# Patient Record
Sex: Male | Born: 1940 | Hispanic: Yes | Smoking: Former smoker
Health system: Southern US, Community
[De-identification: ages and names within clinical notes are randomized; demographics above are authoritative.]

## PROBLEM LIST (undated history)

## (undated) DIAGNOSIS — E785 Hyperlipidemia, unspecified: Secondary | ICD-10-CM

## (undated) DIAGNOSIS — Z21 Asymptomatic human immunodeficiency virus [HIV] infection status: Secondary | ICD-10-CM

## (undated) DIAGNOSIS — F329 Major depressive disorder, single episode, unspecified: Secondary | ICD-10-CM

## (undated) DIAGNOSIS — R001 Bradycardia, unspecified: Secondary | ICD-10-CM

## (undated) DIAGNOSIS — M1990 Unspecified osteoarthritis, unspecified site: Secondary | ICD-10-CM

## (undated) DIAGNOSIS — N400 Enlarged prostate without lower urinary tract symptoms: Secondary | ICD-10-CM

## (undated) DIAGNOSIS — J45909 Unspecified asthma, uncomplicated: Secondary | ICD-10-CM

## (undated) DIAGNOSIS — B20 Human immunodeficiency virus [HIV] disease: Secondary | ICD-10-CM

## (undated) DIAGNOSIS — F32A Depression, unspecified: Secondary | ICD-10-CM

## (undated) DIAGNOSIS — I639 Cerebral infarction, unspecified: Secondary | ICD-10-CM

## (undated) HISTORY — PX: EYE SURGERY: SHX253

## (undated) HISTORY — PX: OTHER SURGICAL HISTORY: SHX169

---

## 2004-09-19 ENCOUNTER — Emergency Department: Payer: Self-pay | Admitting: Unknown Physician Specialty

## 2004-10-03 ENCOUNTER — Other Ambulatory Visit: Payer: Self-pay

## 2004-10-03 ENCOUNTER — Emergency Department: Payer: Self-pay | Admitting: Emergency Medicine

## 2008-12-22 ENCOUNTER — Ambulatory Visit: Payer: Self-pay | Admitting: Urology

## 2008-12-26 ENCOUNTER — Ambulatory Visit: Payer: Self-pay | Admitting: Urology

## 2010-03-14 ENCOUNTER — Ambulatory Visit: Payer: Self-pay | Admitting: Pain Medicine

## 2010-03-20 ENCOUNTER — Ambulatory Visit: Payer: Self-pay | Admitting: Pain Medicine

## 2010-04-09 ENCOUNTER — Ambulatory Visit: Payer: Self-pay | Admitting: Pain Medicine

## 2010-04-17 ENCOUNTER — Ambulatory Visit: Payer: Self-pay | Admitting: Pain Medicine

## 2010-05-03 ENCOUNTER — Ambulatory Visit: Payer: Self-pay | Admitting: Pain Medicine

## 2010-05-10 ENCOUNTER — Ambulatory Visit: Payer: Self-pay | Admitting: Pain Medicine

## 2010-05-24 ENCOUNTER — Ambulatory Visit: Payer: Self-pay | Admitting: Pain Medicine

## 2010-06-06 ENCOUNTER — Ambulatory Visit: Payer: Self-pay | Admitting: Pain Medicine

## 2012-11-09 ENCOUNTER — Emergency Department: Payer: Self-pay | Admitting: Unknown Physician Specialty

## 2012-11-09 LAB — URINALYSIS, COMPLETE
Bilirubin,UR: NEGATIVE
Glucose,UR: NEGATIVE mg/dL (ref 0–75)
Hyaline Cast: 8
Ketone: NEGATIVE
Ph: 5 (ref 4.5–8.0)
Protein: 30
WBC UR: 215 /HPF (ref 0–5)

## 2012-11-09 LAB — COMPREHENSIVE METABOLIC PANEL
Albumin: 3.3 g/dL — ABNORMAL LOW (ref 3.4–5.0)
Alkaline Phosphatase: 68 U/L (ref 50–136)
Anion Gap: 7 (ref 7–16)
Bilirubin,Total: 0.5 mg/dL (ref 0.2–1.0)
Calcium, Total: 8.3 mg/dL — ABNORMAL LOW (ref 8.5–10.1)
EGFR (African American): >60
EGFR (Non-African Amer.): >60
Osmolality: 280 (ref 275–301)
Potassium: 3.4 mmol/L — ABNORMAL LOW (ref 3.5–5.1)
SGPT (ALT): 37 U/L (ref 12–78)
Total Protein: 8 g/dL (ref 6.4–8.2)

## 2012-11-09 LAB — CBC
MCHC: 31.8 g/dL — ABNORMAL LOW (ref 32.0–36.0)
MCV: 84 fL (ref 80–100)
Platelet: 180 10*3/uL (ref 150–440)
RDW: 13.9 % (ref 11.5–14.5)
WBC: 6.3 10*3/uL (ref 3.8–10.6)

## 2012-11-09 LAB — CK TOTAL AND CKMB (NOT AT ARMC): CK-MB: <0.5 ng/mL — ABNORMAL LOW (ref 0.5–3.6)

## 2014-10-15 ENCOUNTER — Observation Stay: Payer: Self-pay | Admitting: Internal Medicine

## 2014-10-15 LAB — CK TOTAL AND CKMB (NOT AT ARMC)
CK, Total: 406 U/L — ABNORMAL HIGH (ref 39–308)
CK, Total: 354 U/L — ABNORMAL HIGH (ref 39–308)
CK, Total: 320 U/L — ABNORMAL HIGH (ref 39–308)
CK-MB: 12.2 ng/mL — ABNORMAL HIGH (ref 0.5–3.6)
CK-MB: 9.3 ng/mL — ABNORMAL HIGH (ref 0.5–3.6)
CK-MB: 6.9 ng/mL — ABNORMAL HIGH (ref 0.5–3.6)

## 2014-10-15 LAB — CBC
HCT: 47 % (ref 40.0–52.0)
HGB: 15.4 g/dL (ref 13.0–18.0)
MCH: 29.3 pg (ref 26.0–34.0)
MCHC: 32.8 g/dL (ref 32.0–36.0)
MCV: 90 fL (ref 80–100)
PLATELETS: 231 10*3/uL (ref 150–440)
RBC: 5.25 10*6/uL (ref 4.40–5.90)
RDW: 14.1 % (ref 11.5–14.5)
WBC: 6.1 10*3/uL (ref 3.8–10.6)

## 2014-10-15 LAB — TROPONIN I
Troponin-I: <0.02 ng/mL
Troponin-I: <0.02 ng/mL
Troponin-I: <0.02 ng/mL

## 2014-10-15 LAB — COMPREHENSIVE METABOLIC PANEL
ALBUMIN: 4 g/dL (ref 3.4–5.0)
ALK PHOS: 68 U/L
ALT: 39 U/L
Anion Gap: 6 — ABNORMAL LOW (ref 7–16)
BUN: 11 mg/dL (ref 7–18)
Bilirubin,Total: 0.6 mg/dL (ref 0.2–1.0)
CALCIUM: 9.1 mg/dL (ref 8.5–10.1)
Chloride: 103 mmol/L (ref 98–107)
Co2: 28 mmol/L (ref 21–32)
Creatinine: 1.08 mg/dL (ref 0.60–1.30)
EGFR (Non-African Amer.): >60
GLUCOSE: 87 mg/dL (ref 65–99)
Osmolality: 273 (ref 275–301)
POTASSIUM: 4.4 mmol/L (ref 3.5–5.1)
SGOT(AST): 51 U/L — ABNORMAL HIGH (ref 15–37)
Sodium: 137 mmol/L (ref 136–145)
Total Protein: 8.3 g/dL — ABNORMAL HIGH (ref 6.4–8.2)

## 2014-10-15 LAB — PROTIME-INR
INR: 1
Prothrombin Time: 13.1 secs (ref 11.5–14.7)

## 2014-10-15 LAB — APTT: Activated PTT: 35 secs (ref 23.6–35.9)

## 2014-10-16 LAB — BASIC METABOLIC PANEL
Anion Gap: 6 — ABNORMAL LOW (ref 7–16)
BUN: 14 mg/dL (ref 7–18)
CALCIUM: 8.8 mg/dL (ref 8.5–10.1)
Chloride: 105 mmol/L (ref 98–107)
Co2: 27 mmol/L (ref 21–32)
Creatinine: 1.16 mg/dL (ref 0.60–1.30)
EGFR (African American): >60
GLUCOSE: 86 mg/dL (ref 65–99)
Osmolality: 275 (ref 275–301)
POTASSIUM: 4.1 mmol/L (ref 3.5–5.1)
Sodium: 138 mmol/L (ref 136–145)

## 2014-10-16 LAB — TSH: Thyroid Stimulating Horm: 0.532 u[IU]/mL

## 2014-10-16 LAB — CBC WITH DIFFERENTIAL/PLATELET
BASOS PCT: 0.6 %
Basophil #: 0 10*3/uL (ref 0.0–0.1)
Eosinophil #: 0.2 10*3/uL (ref 0.0–0.7)
Eosinophil %: 2.8 %
HCT: 43.6 % (ref 40.0–52.0)
HGB: 14.1 g/dL (ref 13.0–18.0)
LYMPHS ABS: 1.5 10*3/uL (ref 1.0–3.6)
LYMPHS PCT: 26.6 %
MCH: 29 pg (ref 26.0–34.0)
MCHC: 32.4 g/dL (ref 32.0–36.0)
MCV: 90 fL (ref 80–100)
Monocyte #: 0.6 x10 3/mm (ref 0.2–1.0)
Monocyte %: 11.2 %
NEUTROS ABS: 3.3 10*3/uL (ref 1.4–6.5)
Neutrophil %: 58.8 %
Platelet: 202 10*3/uL (ref 150–440)
RBC: 4.85 10*6/uL (ref 4.40–5.90)
RDW: 13.9 % (ref 11.5–14.5)
WBC: 5.6 10*3/uL (ref 3.8–10.6)

## 2014-10-16 LAB — LIPID PANEL
Cholesterol: 124 mg/dL (ref 0–200)
HDL Cholesterol: 35 mg/dL — ABNORMAL LOW (ref 40–60)
Ldl Cholesterol, Calc: 57 mg/dL (ref 0–100)
TRIGLYCERIDES: 158 mg/dL (ref 0–200)
VLDL Cholesterol, Calc: 32 mg/dL (ref 5–40)

## 2015-02-18 NOTE — H&P (Signed)
PATIENT NAME:  Brian [name], Brian [name] MR#:  [id number] DATE OF BIRTH:  1941/08/15  DATE OF ADMISSION:  10/15/2014  PRIMARY CARE PHYSICIAN: Meindert A. Lacie Scotts, MD  REFERRING EMERGENCY ROOM PHYSICIAN: Bobetta Lime A. Inocencio Homes, MD  CHIEF COMPLAINT: Syncope.   HISTORY OF PRESENT ILLNESS: The patient is a 74 year old Hispanic male with a past medical history of benign prostatic hypertrophy, HIV, is presenting to the ED with a chief complaint of syncope. According to the son, who speaks limited English, the patient passed out today morning while he was on the potty seat. The patient speaks Spanish, and the history is obtained from the son as well as with the help of a Spanish interpreter, Ms. Marcella.  The patient is reporting that he passed out 3-4 times in the past 1 month. He was evaluated by Dr. Adrian Blackwater as an outpatient, and all work up was negative so for. The patient is found to be bradycardic with a heart rate of 52, but the son is reporting that the patient has a chronic history of some sinus bradycardia, which is asymptomatic. The patient denies any headache, chest pain, shortness of breath. Denies any dizziness. He could not recall how much time he had passed out, and he denies any traumas or injuries. He just reported that, while he was trying to flush, he just went to the side and then he passed out. He could not recall how much time he was out like that. In the ED, CT head was done, which was negative. CAT scan of the neck was also done, which has revealed a cervical spine spondylosis. According to the son, the patient has HIV, but takes medications regularly and his recent CD4 count is in the normal range. He sees Dr. Sampson Goon as  an outpatient. The patient denies any other complaints during my examination.   PAST MEDICAL HISTORY: HIV, benign prostatic hypertrophy, osteoporosis, asthma, and a recent history of syncopal episodes.   PAST SURGICAL HISTORY: None.   ALLERGIES: No known drug allergies.    PSYCHOSOCIAL HISTORY: Lives at home, lives alone. No history of smoking. Occasional intake of alcohol. Denies any illicit drug usage.   FAMILY HISTORY: Both of his parents are healthy.   HOME MEDICATIONS: Tramadol 50 mg p.o. 3 times a day, tamsulosin 0.4 mg 1 capsule p.o. once daily, ProAir 2 puffs inhalation 4 times a day, paroxetine 20 mg p.o. once daily, omeprazole 20 mg p.o. once daily, metaxalone 800 mg 1 tablet 2 times a day, Lyrica 50 mg b.i.d., fluticasone 1 spray nasally once daily, clonazepam 0.5 mg 1 tablet p.o. b.i.d., Cipro 250 mg p.o. b.i.d., Celebrex 200 mg p.o. 2 times a day, Avodart 2.5 mg 1 capsule p.o. once daily, atorvastatin 40 mg p.o. once daily, alendronate 70 mg once a week.   REVIEW OF SYSTEMS:  CONSTITUTIONAL: Denies any fever, fatigue, weakness.  EYES: Denies blurry vision or double vision.  ENT: Denies epistaxis, discharge. Denies any difficulty in swallowing.  RESPIRATORY: Denies cough, COPD, has chronic history of asthma.  CARDIOVASCULAR: No chest pain, palpitations. Has recurrent syncopal episodes in the past 1 month x 4.  GASTROINTESTINAL: Denies any nausea, vomiting, diarrhea, abdominal pain, hematemesis or melena.  GENITOURINARY: No dysuria or hematuria.  ENDOCRINE: Denies polyuria, nocturia, thyroid problems.  HEMATOLOGIC/LYMPHATIC: No anemia, easy bruising or bleeding.  INTEGUMENTARY: No acne, rash, lesions.  MUSCULOSKELETAL: No joint pain in the neck. Denies any gout.  NEUROLOGIC: Denies any vertigo, ataxia. Has recurrent episodes of seizures; etiology  unclear.  PSYCHIATRIC: No  ADD or OCD.   PHYSICAL EXAMINATION:  VITAL SIGNS: Temperature 97.4, pulse 50, respirations 18, blood pressure 169/84, pulse oximetry is 98%.  GENERAL APPEARANCE: Not in acute distress. Moderately built and nourished.  HEENT: Normocephalic, atraumatic. Pupils are equally reacting to light and accommodation. No scleral icterus. No conjunctival  injection. No sinus tenderness. No  postnasal drip. Moist mucous membranes.  NECK: Supple. No JVD. No thyromegaly. Range of motion is intact.  LUNGS: Clear to auscultation bilaterally. No accessory muscle use, and no anterior chest wall tenderness on palpation.  CARDIAC: S1, S2 normal. Regular rate and rhythm. No murmur.  GASTROINTESTINAL: Soft. Bowel sounds are positive in all 4 quadrants. Nontender, nondistended. No hepatosplenomegaly. No masses felt.  NEUROLOGIC: Awake, alert and oriented x3. Cranial nerves II through XII are grossly intact. Motor and sensory are intact. Reflexes are 2+.  EXTREMITIES: No edema. No cyanosis. No clubbing.  SKIN: Warm to touch. Normal turgor. No rashes. No lesions.  MUSCULOSKELETAL: No joint effusion, tenderness, or edema.  PSYCHIATRIC: Normal mood and affect.   LABORATORY AND IMAGING STUDIES: Troponin less than 0.02. CBC is normal. PT-INR are normal. LFTs: AST slightly elevated at 51; the rest of the LFTs are normal. BMP is normal, except anion gap at 6.   CAT scan of the head without contrast (also it includes CT of the cervical spine):No acute intracranial pathology. No acute osseous injury of the cervical spine. Cervical spine spondylosis. Chest x-ray, PA and lateral views, no edema or consolidation.   A 12-lead EKG: Sinus bradycardia at 52 beats per minute, left axis deviation, normal PR and QRS intervals, low voltage, no acute ST-T wave changes, right bundle-branch block.   ASSESSMENT AND PLAN: The patient is a 74 year old Hispanic male brought into the ED with a chief complaint of passing out. Initial CAT scan of the head was negative. EKG did not reveal any acute changes. According to the son, the patient has passed out at least 4 times in the past 1 month. During my examination, the patient is absolutely asymptomatic and resting comfortably.  1.  Syncope, which is recurrent, unclear etiology at this time. Initial CAT scan of the head is negative. CT spine is negative. EKG looks fine. We will  admit him to observation status to monitor closely. The patient will be monitored on telemetry. The patient was seen by Dr. Adrian BlackwaterShaukat Khan for the same complaint, and had an extensive workup done. The patient might be benefited with an event monitor at this time. We will put a cardiology consult. The patient was also evaluated by neurology while he was in the ED. They do not have any new recommendations. We will check orthostatics. Other differential can be reflex sympathetic dystrophy. It does not seem to be vasovagal at this time. We will cycle cardiac biomarkers, but initial troponin is negative. This is very unlikely a heart attack as the patient is asymptomatic. We will also obtain carotid Dopplers. As the patient was recently seen by Dr. Welton FlakesKhan, I am not repeating the echocardiogram.  2.  Benign prostatic hypertrophy; resume his home medication.  3.  Asthma, stable. Continue inhalers on an as-needed basis.  4.  Osteoporosis. Continue Fosamax once weekly.  5.  HIV. The patient sees Dr. Clydie Braunavid Fitzgerald as an outpatient. According to the son, it is well controlled and CD4 count is in the normal range. I will continue his home medications. I have recommended the son to bring his home medications, and we will continue the same, and the  patient is to follow with Dr. Sampson Goon as an outpatient. We will provide him gastrointestinal and deep vein thrombosis prophylaxis.   CODE STATUS: He is Full Code. Son is the medical power of attorney.   TIME SPENT: 45 minutes.   The history is obtained from the patient with the help of Spanish interpreter, Ms. Marcella.   ____________________________ Ramonita Lab, MD ag:MT D: 10/15/2014 16:03:48 ET T: 10/15/2014 16:45:28 ET JOB#: [id number]  cc: Ramonita Lab, MD, <Dictator> Meindert A. Lacie Scotts, MD Ramonita Lab MD ELECTRONICALLY SIGNED 10/22/2014 23:24

## 2015-02-22 NOTE — Discharge Summary (Signed)
PATIENT NAME:  Brian [name], Brian [name] MR#:  [id number] DATE OF BIRTH:  Oct 07, 1941  DATE OF ADMISSION:  10/15/2014 DATE OF DISCHARGE:  10/16/2014  ADMITTING PHYSICIAN: Brian Lab, MD  DISCHARGING PHYSICIAN: Brian Baas, MD   PRIMARY CARDIOLOGIST: Brian Blackwater, MD  Consultations In The Hospital:  1.  Neurology consultation by Brian Browns, MD  2.  Cardiology consultation with Brian D. Callwood, MD    PRIMARY CARE PHYSICIAN: Brian A. Lacie Scotts, MD    DISCHARGE DIAGNOSES:  1.  Syncope, unknown cause. 2.  Asthma.  3.  Benign prostatic hypertrophy.  4.  HIV.  5.  Depression and anxiety.  6.  Arthritis. 7.  Neuropathy.  DISCHARGE HOME MEDICATIONS:  1.  Metaxalone 800 mg p.o. b.i.d.  2.  Tramadol 50 mg p.o. 3 times a day p.r.n. for pain.  3.  Lyrica 50 mg p.o. b.i.d.  4.  Klonopin 0.5 mg p.o. b.i.d.  5.  Atorvastatin 40 mg p.o. at bedtime.  6.  Prilosec 20 mg p.o. daily.  7.  Flomax 0.4 mg p.o. daily.  8.  Azelastine 0.05% ophthalmic solution 1 drop each eye twice a day.  9.  ProAir inhaler 2 puffs 4 times a day as needed.  10.  Alendronate 70 mg p.o. once a week.  11.  Celecoxib 200 mg p.o. b.i.d.   12.  Flonase 50 mcg inhalation nasal spray, 2 sprays each nostril twice a day.  13.  Complera 200 mg/25 mg/300 mg 1 tablet p.o. daily.  14.  Bactrim double strength 1 tablet p.o. b.i.d.  15.  Dutasteride 0.5 mg p.o. daily.   DISCHARGE DIET: Low-sodium diet.   DISCHARGE ACTIVITY: As tolerated.    FOLLOWUP INSTRUCTIONS:  1.  Follow up with all [facility] for Holter monitor removal and check in 2 days.  2.  PCP followup in 1 week.    LABORATORIES AND IMAGING STUDIES PRIOR TO DISCHARGE: Carotid dopplers and ultrasound showing no hemodynamically significant stenosis in both carotid arteries, patent vertebral arteries.   WBC 5.6, hemoglobin 14.1, hematocrit 43.6, platelet count 202,000.   Sodium 138, potassium 4.1, chloride 105, bicarbonate 27, BUN 14, creatinine 1.16,  glucose 86, and calcium of 8.8.   LDL cholesterol 57, HDL 35, total cholesterol 914, triglycerides 158. TSH is 0.532. Troponins remain negative.   CT of the head without contrast showing no acute intracranial pathology. CT of C-spine showing no acute osseous injury of C-spine and C-spine spondylosis noted. Chest x-ray showing clear lung fields. No edema or consolidation. INR is 1.0.   BRIEF HOSPITAL COURSE: Mr. Brian [name] is a Spanish-speaking 74 year old Hispanic male with past medical history significant for HIV, BPH, asthma, recent 2 episodes of syncope who presents to the hospital with recurrent syncope.  1.  Syncope. Not sure if cardiogenic or vasovagal in nature. He appears a little dehydrated. No orthostatically positive hypotension, He denies any aura and states that he just felt weak. Seizures have been ruled out by neurology. No need for EEG according to them. CT head did not show any acute changes. The patient was seen by Dr. Adrian [name] as an outpatient for syncope workup and according to the patient had multiple tests done. No Holter was done yet, so he is being discharged with a Holter monitor. Echocardiogram is done and is pending, but in light of recent cardiac workup we will follow up on the echocardiogram and advised to follow up with Dr. Welton [name] with the Holter removal. He will be placed on Holter at the time of  discharge. He had a carotid ultrasound which was negative for any carotid artery stenosis. He ambulated well with physical therapy, denies any complaints, feels fine and really wants to go home. So, he is being discharged. Only on the monitor, he was noted to be slightly sinus bradycardic with heart rate in the 50s, so his low-dose Coreg he was taking at home was stopped at the time of discharge.   All his other medications were continued. His course has been otherwise uneventful in the hospital.   DISCHARGE CONDITION: Stable.   DISCHARGE DISPOSITION: Home.   TIME SPENT ON  DISCHARGE: Forty minutes.   ____________________________ Brian Baasadhika Azeem Poorman, MD rk:TT D: 10/16/2014 12:51:23 ET T: 10/16/2014 20:38:54 ET JOB#: [id number]  cc: Brian Baasadhika Brodie Correll, MD, <Dictator> Laurier NancyShaukat A. Khan, MD Brian A. Lacie ScottsNiemeyer, MD Brian BaasADHIKA Raedyn Wenke MD ELECTRONICALLY SIGNED 11/01/2014 14:49

## 2015-07-30 ENCOUNTER — Emergency Department: Payer: Medicare Other

## 2015-07-30 ENCOUNTER — Encounter: Payer: Self-pay | Admitting: Intensive Care

## 2015-07-30 ENCOUNTER — Other Ambulatory Visit: Payer: Self-pay

## 2015-07-30 ENCOUNTER — Inpatient Hospital Stay
Admission: EM | Admit: 2015-07-30 | Discharge: 2015-08-03 | DRG: 242 | Disposition: A | Discharge status: Home Health | Payer: Medicare Other | Attending: Internal Medicine | Admitting: Internal Medicine

## 2015-07-30 DIAGNOSIS — E785 Hyperlipidemia, unspecified: Secondary | ICD-10-CM | POA: Diagnosis present

## 2015-07-30 DIAGNOSIS — Z9889 Other specified postprocedural states: Secondary | ICD-10-CM

## 2015-07-30 DIAGNOSIS — I4510 Unspecified right bundle-branch block: Secondary | ICD-10-CM | POA: Diagnosis present

## 2015-07-30 DIAGNOSIS — J45909 Unspecified asthma, uncomplicated: Secondary | ICD-10-CM

## 2015-07-30 DIAGNOSIS — R001 Bradycardia, unspecified: Secondary | ICD-10-CM | POA: Diagnosis present

## 2015-07-30 DIAGNOSIS — N400 Enlarged prostate without lower urinary tract symptoms: Secondary | ICD-10-CM

## 2015-07-30 DIAGNOSIS — B20 Human immunodeficiency virus [HIV] disease: Secondary | ICD-10-CM | POA: Diagnosis present

## 2015-07-30 DIAGNOSIS — Z9981 Dependence on supplemental oxygen: Secondary | ICD-10-CM | POA: Diagnosis not present

## 2015-07-30 DIAGNOSIS — Z950 Presence of cardiac pacemaker: Secondary | ICD-10-CM

## 2015-07-30 DIAGNOSIS — Z79899 Other long term (current) drug therapy: Secondary | ICD-10-CM | POA: Diagnosis not present

## 2015-07-30 DIAGNOSIS — Z21 Asymptomatic human immunodeficiency virus [HIV] infection status: Secondary | ICD-10-CM

## 2015-07-30 DIAGNOSIS — Z8249 Family history of ischemic heart disease and other diseases of the circulatory system: Secondary | ICD-10-CM | POA: Diagnosis not present

## 2015-07-30 DIAGNOSIS — H269 Unspecified cataract: Secondary | ICD-10-CM | POA: Diagnosis present

## 2015-07-30 DIAGNOSIS — M1990 Unspecified osteoarthritis, unspecified site: Secondary | ICD-10-CM | POA: Diagnosis present

## 2015-07-30 DIAGNOSIS — Z7982 Long term (current) use of aspirin: Secondary | ICD-10-CM | POA: Diagnosis not present

## 2015-07-30 DIAGNOSIS — E860 Dehydration: Secondary | ICD-10-CM | POA: Diagnosis present

## 2015-07-30 DIAGNOSIS — N179 Acute kidney failure, unspecified: Secondary | ICD-10-CM | POA: Diagnosis present

## 2015-07-30 DIAGNOSIS — F32A Depression, unspecified: Secondary | ICD-10-CM

## 2015-07-30 DIAGNOSIS — Z7951 Long term (current) use of inhaled steroids: Secondary | ICD-10-CM

## 2015-07-30 DIAGNOSIS — F329 Major depressive disorder, single episode, unspecified: Secondary | ICD-10-CM | POA: Diagnosis present

## 2015-07-30 DIAGNOSIS — R55 Syncope and collapse: Secondary | ICD-10-CM | POA: Diagnosis present

## 2015-07-30 DIAGNOSIS — G4730 Sleep apnea, unspecified: Secondary | ICD-10-CM

## 2015-07-30 HISTORY — DX: Human immunodeficiency virus (HIV) disease: B20

## 2015-07-30 HISTORY — DX: Unspecified asthma, uncomplicated: J45.909

## 2015-07-30 HISTORY — DX: Hyperlipidemia, unspecified: E78.5

## 2015-07-30 HISTORY — DX: Major depressive disorder, single episode, unspecified: F32.9

## 2015-07-30 HISTORY — DX: Bradycardia, unspecified: R00.1

## 2015-07-30 HISTORY — DX: Depression, unspecified: F32.A

## 2015-07-30 HISTORY — DX: Unspecified osteoarthritis, unspecified site: M19.90

## 2015-07-30 HISTORY — DX: Asymptomatic human immunodeficiency virus (hiv) infection status: Z21

## 2015-07-30 LAB — CBC
HEMATOCRIT: 40.1 % (ref 40.0–52.0)
HEMOGLOBIN: 13.5 g/dL (ref 13.0–18.0)
MCH: 28.3 pg (ref 26.0–34.0)
MCHC: 33.6 g/dL (ref 32.0–36.0)
MCV: 84.1 fL (ref 80.0–100.0)
Platelets: 197 10*3/uL (ref 150–440)
RBC: 4.77 MIL/uL (ref 4.40–5.90)
RDW: 15.1 % — ABNORMAL HIGH (ref 11.5–14.5)
WBC: 5.8 10*3/uL (ref 3.8–10.6)

## 2015-07-30 LAB — COMPREHENSIVE METABOLIC PANEL
ALK PHOS: 43 U/L (ref 38–126)
ALT: 20 U/L (ref 17–63)
AST: 34 U/L (ref 15–41)
Albumin: 3.6 g/dL (ref 3.5–5.0)
Anion gap: 9 (ref 5–15)
BILIRUBIN TOTAL: 0.4 mg/dL (ref 0.3–1.2)
BUN: 11 mg/dL (ref 6–20)
CALCIUM: 8.4 mg/dL — AB (ref 8.9–10.3)
CHLORIDE: 107 mmol/L (ref 101–111)
CO2: 21 mmol/L — ABNORMAL LOW (ref 22–32)
CREATININE: 1.24 mg/dL (ref 0.61–1.24)
GFR, EST NON AFRICAN AMERICAN: 56 mL/min — AB (ref >60)
Glucose, Bld: 92 mg/dL (ref 65–99)
Potassium: 3.8 mmol/L (ref 3.5–5.1)
Sodium: 137 mmol/L (ref 135–145)
TOTAL PROTEIN: 6.1 g/dL — AB (ref 6.5–8.1)

## 2015-07-30 LAB — MAGNESIUM: MAGNESIUM: 1.9 mg/dL (ref 1.7–2.4)

## 2015-07-30 LAB — GLUCOSE, CAPILLARY: Glucose-Capillary: 88 mg/dL (ref 65–99)

## 2015-07-30 LAB — TROPONIN I: Troponin I: <0.03 ng/mL (ref <0.031)

## 2015-07-30 LAB — ETHANOL: Alcohol, Ethyl (B): <5 mg/dL (ref <5)

## 2015-07-30 MED ORDER — SODIUM CHLORIDE 0.9 % IV BOLUS (SEPSIS)
500.0000 mL | Freq: Once | INTRAVENOUS | Status: AC
  Administered 2015-07-30: 500 mL via INTRAVENOUS

## 2015-07-30 NOTE — ED Notes (Signed)
Pt's daughter in law states pt with episodes of "passing out" since march. Pt currently awake and alert to verbal stimuli. Pt denies pain, denies shob, dizziness, diaphoresis. Pt's daughter states pt fell asleep after eating and having one beer this pm. Pt 's daughter states pt was difficulty to arouse, therefore she called ems. Ems gave pt one amp of atropine. For hr of 40s per amber rn pta.

## 2015-07-30 NOTE — H&P (Signed)
[facility] [place] [facility]   PATIENT NAME: Brian [name]    MR#:  [id number]  DATE OF BIRTH:  Sep 19, 1941  DATE OF ADMISSION:  07/30/2015  PRIMARY CARE PHYSICIAN: Evelene Croon, MD   REQUESTING/REFERRING PHYSICIAN: Huel Cote  CHIEF COMPLAINT:   Chief Complaint  Patient presents with  . Bradycardia   syncopal episode  HISTORY OF PRESENT ILLNESS:  Brian [name]  is a 74 y.o. male with a known history of recurrent syncope status post extensive cardiac workup, HIV, bronchial asthma, sleep apnea on nocturnal O2, hyperlipidemia, depression, BPH, arthritis was brought in by EMS to the emergency room with the complaint of syncopal episode. According to the patient's daughter-in-law who is with the patient at this time, patient passed out while sitting at home and they did have difficulty to arouse him hence called EMS who found the patient alert awake with stable vitals except for  low heart rate around 50s. During transportation by the EMS patient was noted to have heart rates in the range of 40s, was given IV atropine 0.5 mg. Evaluation  in the ED found the patient alert awake and oriented, stable vitals except for heart rate hovering around upper 40s to 50s. Lab work revealed normal CBC, CMP except for creatinine of 1.24. Troponin less than 0.03, EtOH level less than 5. Chest x-ray negative for acute cardio pulmonary pathology. EKG sinus bradycardia with ventricular rate of 49 bpm, left axis deviation, right bundle branch block, T-wave abnormality in inferior lateral leads. Hospitalist service was consulted for further management. Patient is currently resting comfortably in the bed, denies any complaints such as dizziness, palpitations, chest pain, shortness of breath, nausea, vomiting, diarrhea, diaphoresis. Patient has a history of recurrent syncope for which he had extensive cardiac workup done which was unremarkable per his daughter-in-law.  PAST MEDICAL HISTORY:    Past Medical History  Diagnosis Date  . HIV (human immunodeficiency virus infection) (HCC)   . Bradycardia   . Arthritis   . Depression   . Hyperlipidemia   . Asthma     PAST SURGICAL HISTORY:   Past Surgical History  Procedure Laterality Date  . Cataracts    . Eye surgery      SOCIAL HISTORY:   Social History  Substance Use Topics  . Smoking status: Never Smoker   . Smokeless tobacco: Never Used  . Alcohol Use: 1.2 oz/week    2 Cans of beer per week     Comment: 1-2 drinks a day    FAMILY HISTORY:   Family History  Problem Relation Age of Onset  . Hypertension Other     DRUG ALLERGIES:  No Known Allergies  REVIEW OF SYSTEMS:   Review of Systems  Constitutional: Negative for fever, chills and malaise/fatigue.  HENT: Negative for ear pain, hearing loss, nosebleeds, sore throat and tinnitus.   Eyes: Negative for blurred vision, double vision, pain, discharge and redness.  Respiratory: Negative for cough, hemoptysis, sputum production, shortness of breath and wheezing.   Cardiovascular: Negative for chest pain, palpitations, orthopnea and leg swelling.  Gastrointestinal: Negative for nausea, vomiting, abdominal pain, diarrhea, constipation, blood in stool and melena.  Genitourinary: Negative for dysuria, urgency, frequency and hematuria.  Musculoskeletal: Positive for back pain. Negative for joint pain and neck pain.  Skin: Negative for itching and rash.  Neurological: Negative for dizziness, tingling, sensory change, focal weakness and seizures.       Syncopal episode at home as mentioned in history of present  illness.  Endo/Heme/Allergies: Does not bruise/bleed easily.  Psychiatric/Behavioral: Positive for depression. The patient is not nervous/anxious.     MEDICATIONS AT HOME:   Prior to Admission medications   Medication Sig Start Date End Date Taking? Authorizing Provider  albuterol (PROVENTIL) (2.5 MG/3ML) 0.083% nebulizer solution Inhale 2.5 mg  into the lungs 2 (two) times daily as needed. For shortness of breath and/or wheezing   Yes Historical Provider, MD  alendronate (FOSAMAX) 70 MG tablet Take 70 mg by mouth once a week. Take on Saturday. 07/21/15  Yes Historical Provider, MD  aspirin EC 81 MG tablet Take 81 mg by mouth daily.   Yes Historical Provider, MD  atorvastatin (LIPITOR) 40 MG tablet Take 40 mg by mouth daily.   Yes Historical Provider, MD  celecoxib (CELEBREX) 200 MG capsule Take 200 mg by mouth 2 (two) times daily. 07/21/15  Yes Historical Provider, MD  clonazePAM (KLONOPIN) 0.5 MG tablet Take 0.5 mg by mouth 2 (two) times daily. 07/21/15  Yes Historical Provider, MD  COMPLERA 200-25-300 MG TABS Take 1 tablet by mouth daily. 07/21/15  Yes Historical Provider, MD  dutasteride (AVODART) 0.5 MG capsule Take 0.5 mg by mouth daily. 07/21/15  Yes Historical Provider, MD  fluticasone (FLONASE) 50 MCG/ACT nasal spray Place 2 sprays into both nostrils daily as needed. For rhinitis. 07/21/15  Yes Historical Provider, MD  LYRICA 50 MG capsule Take 50 mg by mouth 2 (two) times daily. 07/21/15  Yes Historical Provider, MD  metaxalone (SKELAXIN) 800 MG tablet Take 800 mg by mouth 2 (two) times daily. 07/16/15  Yes Historical Provider, MD  omeprazole (PRILOSEC) 20 MG capsule Take 20 mg by mouth daily. 07/21/15  Yes Historical Provider, MD  PARoxetine (PAXIL) 20 MG tablet Take 20 mg by mouth daily.   Yes Historical Provider, MD  sulfamethoxazole-trimethoprim (BACTRIM DS,SEPTRA DS) 800-160 MG tablet Take 1 tablet by mouth daily. *routine medication* 07/21/15  Yes Historical Provider, MD  tamsulosin (FLOMAX) 0.4 MG CAPS capsule Take 0.4 mg by mouth daily. 07/21/15  Yes Historical Provider, MD  tiotropium (SPIRIVA) 18 MCG inhalation capsule Place 18 mcg into inhaler and inhale daily.   Yes Historical Provider, MD  tiZANidine (ZANAFLEX) 4 MG tablet Take 4 mg by mouth at bedtime. 05/22/15  Yes Historical Provider, MD  traMADol (ULTRAM) 50 MG tablet Take 50  mg by mouth 3 (three) times daily as needed. For pain. 07/20/15  Yes Historical Provider, MD  VENTOLIN HFA 108 (90 BASE) MCG/ACT inhaler Inhale 2 puffs into the lungs 4 (four) times daily as needed. For shortness of breath and/or wheezing. 07/21/15  Yes Historical Provider, MD      VITAL SIGNS:  Blood pressure 139/66, pulse 49, temperature 97.8 F (36.6 C), temperature source Oral, resp. rate 17, height  (1.702 m), weight 69.4 kg (153 lb), SpO2 100 %.  PHYSICAL EXAMINATION:  Physical Exam  Constitutional: He is oriented to person, place, and time. He appears well-developed and well-nourished. No distress.  HENT:  Head: Normocephalic and atraumatic.  Right Ear: External ear normal.  Left Ear: External ear normal.  Nose: Nose normal.  Mouth/Throat: Oropharynx is clear and moist. No oropharyngeal exudate.  Eyes: EOM are normal. Pupils are equal, round, and reactive to light. No scleral icterus.  Neck: Normal range of motion. Neck supple. No JVD present. No thyromegaly present.  Cardiovascular: Regular rhythm, normal heart sounds and intact distal pulses.  Exam reveals no friction rub.   No murmur heard. Bradycardia +  Respiratory: Effort  normal and breath sounds normal. No respiratory distress. He has no wheezes. He has no rales. He exhibits no tenderness.  GI: Soft. Bowel sounds are normal. He exhibits no distension and no mass. There is no tenderness. There is no rebound and no guarding.  Musculoskeletal: Normal range of motion. He exhibits no edema.  Lymphadenopathy:    He has no cervical adenopathy.  Neurological: He is alert and oriented to person, place, and time. He has normal reflexes. He displays normal reflexes. No cranial nerve deficit. He exhibits normal muscle tone.  Skin: Skin is warm. No rash noted. No erythema.  Psychiatric: He has a normal mood and affect. His behavior is normal. Thought content normal.   LABORATORY PANEL:   CBC  Recent Labs Lab 07/30/15 1916   WBC 5.8  HGB 13.5  HCT 40.1  PLT 197   ------------------------------------------------------------------------------------------------------------------  Chemistries   Recent Labs Lab 07/30/15 1916  NA 137  K 3.8  CL 107  CO2 21*  GLUCOSE 92  BUN 11  CREATININE 1.24  CALCIUM 8.4*  MG 1.9  AST 34  ALT 20  ALKPHOS 43  BILITOT 0.4   ------------------------------------------------------------------------------------------------------------------  Cardiac Enzymes  Recent Labs Lab 07/30/15 1916  TROPONINI <0.03   ------------------------------------------------------------------------------------------------------------------  RADIOLOGY:  Dg Chest 1 View  07/30/2015   CLINICAL DATA:  Syncope. No shortness of breath, pain, dizziness or diaphoresis. Bradycardia.  EXAM: CHEST 1 VIEW  COMPARISON:  10/15/2014  FINDINGS: The heart size and mediastinal contours are within normal limits. Both lungs are clear. The visualized skeletal structures are unremarkable.  IMPRESSION: No active disease.   Electronically Signed   By: Elige Ko   On: 07/30/2015 20:11    EKG:   Orders placed or performed during the hospital encounter of 07/30/15  . ED EKG within 10 minutes  . ED EKG within 10 minutes  Sinus bradycardia with heart rate of 49 bpm, LAD, right bundle branch block, T-wave abnormality in inferior lateral leads.  IMPRESSION AND PLAN:   1. Syncopal episode. History of recurrent syncope status post extensive cardiac workup and per patient's family was negative. History of chronic bradycardia. All labs normal except for creatinine of 1.24. Etiology of syncope? Cardiogenic,? Vasovagal,? Dehydration. Patient may need pacemaker insertion Plan: Admit, telemetry monitoring, cycle cardiac enzymes, gentle IV hydration. Echocardiogram and cardiology consultation requested for further evaluation and advice. 2. Acute kidney injury with creatinine of 1.24. Likely dehydration. Plan:  Gentle IV hydration, follow-up BMP. 3. Bronchial asthma, stable on home medications. No acute problems. Continue home medications 4. Sleep apnea with history of nocturnal hypoxia-on O2 at night. No acute problems. Continue nocturnal O2 supplementation. 5. HIV, stable on home medications. No acute problems. Continue same. 6. Depression, stable on home medications. Continue same. 7. BPH, stable on home medications. No acute problems. Continue same  DVT prophylaxis: Subcutaneous Lovenox   All the records are reviewed and case discussed with ED provider. Management plans discussed with the patient, family and they are in agreement.  CODE STATUS: Full code  TOTAL TIME TAKING CARE OF THIS PATIENT: 50 minutes.    Jonnie Kind N M.D on 07/30/2015 at 11:45 PM  Between 7am to 6pm - Pager - [phone]  After 6pm go to [email or web address] - password [email or web address] [facility]  [facility]  Office  [phone]  CC: Primary care physician; Evelene Croon, MD

## 2015-07-30 NOTE — ED Notes (Signed)
Pt sipping on po fluids.  

## 2015-07-30 NOTE — ED Notes (Signed)
Warm blankets provided to family and pt. Pt continues to deny pain.

## 2015-07-30 NOTE — ED Notes (Signed)
hospitalist in to see pt.

## 2015-07-30 NOTE — ED Notes (Addendum)
Pt resting, resps unlabored. Skin normal color, warm and dry. Vss, with exception of hr 44. 3+ radial pulses noted.

## 2015-07-30 NOTE — ED Notes (Signed)
Patient arrived by EMS from daughters house. Daughter called ems. EMS had HR of 40, ems administered .5 Atropine and then had HR of 51. Patient has HX of HIV. Patient is compliant with meds

## 2015-07-30 NOTE — ED Notes (Signed)
Pt resting, resps unlabored.  

## 2015-07-31 ENCOUNTER — Inpatient Hospital Stay
Admit: 2015-07-31 | Discharge: 2015-07-31 | Disposition: A | Discharge status: Home / Self Care | Payer: Medicare Other | Attending: Internal Medicine | Admitting: Internal Medicine

## 2015-07-31 LAB — BASIC METABOLIC PANEL
ANION GAP: 4 — AB (ref 5–15)
BUN: 11 mg/dL (ref 6–20)
CALCIUM: 8.5 mg/dL — AB (ref 8.9–10.3)
CHLORIDE: 109 mmol/L (ref 101–111)
CO2: 25 mmol/L (ref 22–32)
Creatinine, Ser: 1.12 mg/dL (ref 0.61–1.24)
GFR calc Af Amer: >60 mL/min (ref >60)
GFR calc non Af Amer: >60 mL/min (ref >60)
GLUCOSE: 113 mg/dL — AB (ref 65–99)
Potassium: 3.9 mmol/L (ref 3.5–5.1)
Sodium: 138 mmol/L (ref 135–145)

## 2015-07-31 LAB — TROPONIN I
Troponin I: <0.03 ng/mL (ref <0.031)
Troponin I: <0.03 ng/mL (ref <0.031)

## 2015-07-31 MED ORDER — FLUTICASONE PROPIONATE 50 MCG/ACT NA SUSP
1.0000 | Freq: Every day | NASAL | Status: DC
  Administered 2015-07-31 – 2015-08-03 (×3): 1 via NASAL
  Filled 2015-07-31: qty 16

## 2015-07-31 MED ORDER — DUTASTERIDE 0.5 MG PO CAPS
0.5000 mg | ORAL_CAPSULE | Freq: Every day | ORAL | Status: DC
  Administered 2015-07-31 – 2015-08-03 (×3): 0.5 mg via ORAL
  Filled 2015-07-31 (×3): qty 1

## 2015-07-31 MED ORDER — ACETAMINOPHEN 650 MG RE SUPP: 650.0000 mg | Freq: Four times a day (QID) | RECTAL | Status: DC | PRN

## 2015-07-31 MED ORDER — TIZANIDINE HCL 4 MG PO TABS
4.0000 mg | ORAL_TABLET | Freq: Every day | ORAL | Status: DC
  Administered 2015-07-31 – 2015-08-02 (×4): 4 mg via ORAL
  Filled 2015-07-31 (×4): qty 1

## 2015-07-31 MED ORDER — ALENDRONATE SODIUM 70 MG PO TABS: 70.0000 mg | ORAL_TABLET | ORAL | Status: DC

## 2015-07-31 MED ORDER — ONDANSETRON HCL 4 MG PO TABS: 4.0000 mg | ORAL_TABLET | Freq: Four times a day (QID) | ORAL | Status: DC | PRN

## 2015-07-31 MED ORDER — METAXALONE 800 MG PO TABS
800.0000 mg | ORAL_TABLET | Freq: Two times a day (BID) | ORAL | Status: DC
  Administered 2015-07-31 – 2015-08-03 (×7): 800 mg via ORAL
  Filled 2015-07-31 (×9): qty 1

## 2015-07-31 MED ORDER — ASPIRIN EC 81 MG PO TBEC: 81.0000 mg | DELAYED_RELEASE_TABLET | Freq: Every day | ORAL | Status: DC

## 2015-07-31 MED ORDER — ENOXAPARIN SODIUM 40 MG/0.4ML ~~LOC~~ SOLN
40.0000 mg | Freq: Every day | SUBCUTANEOUS | Status: DC
  Administered 2015-07-31 – 2015-08-01 (×3): 40 mg via SUBCUTANEOUS
  Filled 2015-07-31 (×3): qty 0.4

## 2015-07-31 MED ORDER — SULFAMETHOXAZOLE-TRIMETHOPRIM 800-160 MG PO TABS
1.0000 | ORAL_TABLET | Freq: Every day | ORAL | Status: DC
  Administered 2015-07-31 – 2015-08-03 (×4): 1 via ORAL
  Filled 2015-07-31 (×3): qty 1

## 2015-07-31 MED ORDER — ALBUTEROL SULFATE (2.5 MG/3ML) 0.083% IN NEBU: 2.5000 mg | INHALATION_SOLUTION | RESPIRATORY_TRACT | Status: DC | PRN

## 2015-07-31 MED ORDER — ONDANSETRON HCL 4 MG/2ML IJ SOLN
4.0000 mg | Freq: Four times a day (QID) | INTRAMUSCULAR | Status: DC | PRN
  Administered 2015-08-02: 4 mg via INTRAVENOUS

## 2015-07-31 MED ORDER — PREGABALIN 50 MG PO CAPS
50.0000 mg | ORAL_CAPSULE | Freq: Two times a day (BID) | ORAL | Status: DC
  Administered 2015-07-31 – 2015-08-03 (×7): 50 mg via ORAL
  Filled 2015-07-31 (×7): qty 1

## 2015-07-31 MED ORDER — TIOTROPIUM BROMIDE MONOHYDRATE 18 MCG IN CAPS
18.0000 ug | ORAL_CAPSULE | Freq: Every day | RESPIRATORY_TRACT | Status: DC
  Administered 2015-07-31 – 2015-08-03 (×3): 18 ug via RESPIRATORY_TRACT
  Filled 2015-07-31: qty 5

## 2015-07-31 MED ORDER — PANTOPRAZOLE SODIUM 40 MG PO TBEC
40.0000 mg | DELAYED_RELEASE_TABLET | Freq: Every day | ORAL | Status: DC
Start: 2015-07-31 — End: 2015-07-31
  Administered 2015-07-31: 40 mg via ORAL
  Filled 2015-07-31: qty 1

## 2015-07-31 MED ORDER — ACETAMINOPHEN 325 MG PO TABS
650.0000 mg | ORAL_TABLET | Freq: Four times a day (QID) | ORAL | Status: DC | PRN
Start: 2015-07-31 — End: 2015-08-03
  Administered 2015-07-31: 650 mg via ORAL
  Filled 2015-07-31: qty 2

## 2015-07-31 MED ORDER — SODIUM CHLORIDE 0.9 % IV SOLN
INTRAVENOUS | Status: AC
  Administered 2015-07-31: 01:00:00 via INTRAVENOUS

## 2015-07-31 MED ORDER — SODIUM CHLORIDE 0.9 % IJ SOLN
3.0000 mL | Freq: Two times a day (BID) | INTRAMUSCULAR | Status: DC
  Administered 2015-07-31 – 2015-08-03 (×7): 3 mL via INTRAVENOUS

## 2015-07-31 MED ORDER — CLONAZEPAM 0.5 MG PO TABS
0.5000 mg | ORAL_TABLET | Freq: Two times a day (BID) | ORAL | Status: DC
  Administered 2015-07-31 – 2015-08-03 (×7): 0.5 mg via ORAL
  Filled 2015-07-31 (×7): qty 1

## 2015-07-31 MED ORDER — PAROXETINE HCL 20 MG PO TABS
20.0000 mg | ORAL_TABLET | Freq: Every day | ORAL | Status: DC
  Administered 2015-07-31 – 2015-08-03 (×3): 20 mg via ORAL
  Filled 2015-07-31 (×3): qty 1

## 2015-07-31 MED ORDER — ATORVASTATIN CALCIUM 20 MG PO TABS
40.0000 mg | ORAL_TABLET | Freq: Every day | ORAL | Status: DC
  Administered 2015-07-31 – 2015-08-02 (×3): 40 mg via ORAL
  Filled 2015-07-31 (×3): qty 2

## 2015-07-31 MED ORDER — EMTRICITAB-RILPIVIR-TENOFOV DF 200-25-300 MG PO TABS
1.0000 | ORAL_TABLET | Freq: Every day | ORAL | Status: DC
  Administered 2015-07-31 – 2015-08-03 (×4): 1 via ORAL
  Filled 2015-07-31: qty 1

## 2015-07-31 MED ORDER — TAMSULOSIN HCL 0.4 MG PO CAPS
0.4000 mg | ORAL_CAPSULE | Freq: Every day | ORAL | Status: DC
  Administered 2015-07-31 – 2015-08-03 (×3): 0.4 mg via ORAL
  Filled 2015-07-31 (×3): qty 1

## 2015-07-31 MED ORDER — TRAMADOL HCL 50 MG PO TABS
50.0000 mg | ORAL_TABLET | Freq: Three times a day (TID) | ORAL | Status: DC | PRN
  Administered 2015-07-31: 50 mg via ORAL
  Filled 2015-07-31: qty 1

## 2015-07-31 MED ORDER — ASPIRIN EC 81 MG PO TBEC
81.0000 mg | DELAYED_RELEASE_TABLET | Freq: Every day | ORAL | Status: DC
  Administered 2015-07-31 – 2015-08-03 (×3): 81 mg via ORAL
  Filled 2015-07-31 (×3): qty 1

## 2015-07-31 NOTE — ED Provider Notes (Signed)
Time Seen: Approximately 1900 I have reviewed the triage notes  Chief Complaint: Bradycardia   History of Present Illness: Brian [name] is a 74 y.o. male who was transported here by EMS after he had a syncopal episode at home while watching TV. Patient's history and review of systems was taken through an interpreter. Patient also has family who are the primary historians at times. Patient apparently had a recent history of multiple syncopal episodes usually brief in nature. The patient has had some form of outpatient assessment and was told that his heart rate "" was elevated "". The patient was transported here by EMS after they found the patient very bradycardic and had atropine prior to arrival. Patient had a syncopal episode was approximately 25 minutes for the family to arouse the patient at home. He also had trouble with ambulation to the ambulance. The patient denies any chest pain though he himself is a poor historian and the family states that he is very reluctant to give a full extensive history. Patient has had syncopal episode with fall and injury though not of significance. He denies any shortness of breath, nausea, vomiting, arm, jaw pain etc.   Past Medical History  Diagnosis Date  . HIV (human immunodeficiency virus infection) (HCC)   . Bradycardia   . Arthritis   . Depression   . Hyperlipidemia   . Asthma     Patient Active Problem List   Diagnosis Date Noted  . Syncope 07/30/2015  . AKI (acute kidney injury) (HCC) 07/30/2015  . 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  . Sleep apnea 07/30/2015  . HIV (human immunodeficiency virus infection) (HCC) 07/30/2015  . Depression 07/30/2015  . BPH (benign prostatic hyperplasia) 07/30/2015    Past Surgical History  Procedure Laterality Date  . Cataracts    . Eye surgery      Past Surgical History  Procedure Laterality Date  . Cataracts    . Eye surgery      No current outpatient prescriptions on file.  Allergies:  Review of  patient's allergies indicates no known allergies.  Family History: Family History  Problem Relation Age of Onset  . Hypertension Other     Social History: Social History  Substance Use Topics  . Smoking status: Never Smoker   . Smokeless tobacco: Never Used  . Alcohol Use: 1.2 oz/week    2 Cans of beer per week     Comment: 1-2 drinks a day     Review of Systems:   10 point review of systems was performed and was otherwise negative:  Constitutional: No fever Eyes: No visual disturbances ENT: No sore throat, ear pain Cardiac: No chest pain Respiratory: No shortness of breath, wheezing, or stridor Abdomen: No abdominal pain, no vomiting, No diarrhea Endocrine: No weight loss, No night sweats Extremities: No peripheral edema, cyanosis Skin: No rashes, easy bruising Neurologic: No focal weakness, trouble with speech or swollowing Urologic: No dysuria, Hematuria, or urinary frequency   Physical Exam:  ED Triage Vitals  Enc Vitals Group     BP 07/30/15 1852 91/61 mmHg     Pulse Rate 07/30/15 1852 49     Resp 07/30/15 1852 17     Temp 07/30/15 1852 97.8 F (36.6 C)     Temp Source 07/30/15 1852 Oral     SpO2 07/30/15 1852 96 %     Weight 07/30/15 1852 153 lb (69.4 kg)     Height 07/30/15 1852  (1.702 m)     Head  Cir --      Peak Flow --      Pain Score --      Pain Loc --      Pain Edu? --      Excl. in GC? --     General: Awake , Alert , and Oriented times 3; GCS 15 Head: Normal cephalic , atraumatic Eyes: Pupils equal , round, reactive to light Nose/Throat: No nasal drainage, patent upper airway without erythema or exudate.  Neck: Supple, Full range of motion, No anterior adenopathy or palpable thyroid masses Lungs: Clear to ascultation without wheezes , rhonchi, or rales Heart bradycardia, without murmurs gallops or rubs Abdomen: Soft, non tender without rebound, guarding , or rigidity; bowel sounds positive and symmetric in all 4 quadrants. No  organomegaly .        Extremities: 2 plus symmetric pulses. No edema, clubbing or cyanosis Neurologic: normal ambulation, Motor symmetric without deficits, sensory intact Skin: warm, dry, no rashes   Labs:   All laboratory work was reviewed including any pertinent negatives or positives listed below:  Labs Reviewed  CBC - Abnormal; Notable for the following:    RDW 15.1 (*)    All other components within normal limits  COMPREHENSIVE METABOLIC PANEL - Abnormal; Notable for the following:    CO2 21 (*)    Calcium 8.4 (*)    Total Protein 6.1 (*)    GFR calc non Af Amer 56 (*)    All other components within normal limits  GLUCOSE, CAPILLARY  TROPONIN I  MAGNESIUM  ETHANOL  BASIC METABOLIC PANEL  TROPONIN I  TROPONIN I  TROPONIN I   review of the laboratory work showed no significant abnormalities  EKG: ED ECG REPORT I, Jennye Moccasin, the attending physician, personally viewed and interpreted this ECG.  Date: 07/31/2015 EKG Time: 1856 Rate: 49 Rhythm: Sinus bradycardia QRS Axis: Left axis deviation Intervals: Right bundle-branch block ST/T Wave abnormalities: Nonspecific T wave abnormality seen diffusely Conduction Disutrbances: none Narrative Interpretation: unremarkable   Radiology:  CHEST 1 VIEW  COMPARISON: 10/15/2014  FINDINGS: The heart size and mediastinal contours are within normal limits. Both lungs are clear. The visualized skeletal structures are unremarkable.  IMPRESSION: No active disease.     I personally reviewed the radiologic studies    ED Course:  Patient was placed on a continuous cardiac monitor. He's had an episode where his heart rate decreased down into the mid 30s. Otherwise, most the time his heart rate was in the low to mid 50s. The patient had no further syncopal episodes while here. Concern is that the patient may have another syncopal episode at home and his family will be out of town tomorrow. Patient to this case was  reviewed with the hospitalist team, further disposition and management depends upon her evaluation  Assessment:  Bradycardia Syncope Possible sick sinus syndrome   Final diagnoses:  Bradycardia     Plan:  Inpatient management Patient's case was reviewed with the hospitalist team, further disposition and management depends upon her evaluation           Jennye Moccasin, MD 07/31/15 [id number]

## 2015-07-31 NOTE — Care Management Note (Signed)
Case Management Note  Patient Details  Name: Brian [name] MRN: [id number] Date of Birth: 1941/04/19  Subjective/Objective:                 Patient admitted from home with syncopal episode.  Patient lives at home alone.  Patient uses [facility] in [place].  Patient has family near by for support, and daughter in law states that they have been discussing the patient moving in with them long term.   Daughter in law request list of PCS.  List provided.  Patient drives, and family is also used for transportation.  Daughter in law has concerns of unsteadiness with ambulation.  Will request PT consult.     Action/Plan:  RNCM to continue following   Expected Discharge Date:                  Expected Discharge Plan:     In-House Referral:     Discharge planning Services     Post Acute Care Choice:    Choice offered to:     DME Arranged:    DME Agency:     HH Arranged:    HH Agency:     Status of Service:     Medicare Important Message Given:  Yes-second notification given Date Medicare IM Given:    Medicare IM give by:    Date Additional Medicare IM Given:    Additional Medicare Important Message give by:     If discussed at Long Length of Stay Meetings, dates discussed:    Additional Comments:  Chapman Fitch, RN 07/31/2015, 1:56 PM

## 2015-07-31 NOTE — Progress Notes (Signed)
Pt. Rested peacefully throughout the night with no acute distress noted. No signs or respiratory distress noted. Pt. A&O. SB on tele. Will continue to monitor pt.

## 2015-07-31 NOTE — Consult Note (Signed)
Primary Cardiologist: Dr. Adrian Blackwater    Reason for Consultation : Syncope   HPI : This is a 74yo hispanic male known to our practice who presented to ER yesterday with episode of syncope while he was eating. Work-up including CTA coronaries, carotid dopplers, holter monitor, and echo were completed late 2015, all wnl. Etiology of syncope thought to be orthostatic or vasovagal in nature.         Review of Systems: General: negative for chills, fever, night sweats or weight changes.  Cardiovascular: negative for chest pain, edema, orthopnea, palpitations, paroxysmal nocturnal dyspnea, shortness of breath or dyspnea on exertion Dermatological: negative for rash Respiratory: negative for cough or wheezing Urologic: negative for hematuria Abdominal: negative for nausea, vomiting, diarrhea, bright red blood per rectum, melena, or hematemesis Neurologic: negative for visual changes, syncope, or dizziness All other systems reviewed and are otherwise negative except as noted above.    Past Medical History  Diagnosis Date  . HIV (human immunodeficiency virus infection) (HCC)   . Bradycardia   . Arthritis   . Depression   . Hyperlipidemia   . Asthma     Medications Prior to Admission  Medication Sig Dispense Refill  . albuterol (PROVENTIL) (2.5 MG/3ML) 0.083% nebulizer solution Inhale 2.5 mg into the lungs 2 (two) times daily as needed. For shortness of breath and/or wheezing    . alendronate (FOSAMAX) 70 MG tablet Take 70 mg by mouth once a week. Take on Saturday.  5  . aspirin EC 81 MG tablet Take 81 mg by mouth daily.    Marland Kitchen atorvastatin (LIPITOR) 40 MG tablet Take 40 mg by mouth daily.    . celecoxib (CELEBREX) 200 MG capsule Take 200 mg by mouth 2 (two) times daily.  5  . clonazePAM (KLONOPIN) 0.5 MG tablet Take 0.5 mg by mouth 2 (two) times daily.  2  . COMPLERA 200-25-300 MG TABS Take 1 tablet by mouth daily.  11  . dutasteride (AVODART) 0.5 MG capsule Take 0.5 mg by  mouth daily.  5  . fluticasone (FLONASE) 50 MCG/ACT nasal spray Place 2 sprays into both nostrils daily as needed. For rhinitis.  5  . LYRICA 50 MG capsule Take 50 mg by mouth 2 (two) times daily.  2  . metaxalone (SKELAXIN) 800 MG tablet Take 800 mg by mouth 2 (two) times daily.  5  . omeprazole (PRILOSEC) 20 MG capsule Take 20 mg by mouth daily.  5  . PARoxetine (PAXIL) 20 MG tablet Take 20 mg by mouth daily.    Marland Kitchen sulfamethoxazole-trimethoprim (BACTRIM DS,SEPTRA DS) 800-160 MG tablet Take 1 tablet by mouth daily. *routine medication*  11  . tamsulosin (FLOMAX) 0.4 MG CAPS capsule Take 0.4 mg by mouth daily.  5  . tiotropium (SPIRIVA) 18 MCG inhalation capsule Place 18 mcg into inhaler and inhale daily.    Marland Kitchen tiZANidine (ZANAFLEX) 4 MG tablet Take 4 mg by mouth at bedtime.  3  . traMADol (ULTRAM) 50 MG tablet Take 50 mg by mouth 3 (three) times daily as needed. For pain.  2  . VENTOLIN HFA 108 (90 BASE) MCG/ACT inhaler Inhale 2 puffs into the lungs 4 (four) times daily as needed. For shortness of breath and/or wheezing.  5     . aspirin EC  81 mg Oral Daily  . atorvastatin  40 mg Oral QHS  . clonazePAM  0.5 mg Oral BID  . dutasteride  0.5 mg Oral Daily  . Emtricitab-Rilpivir-Tenofov DF  1 tablet Oral Daily  . enoxaparin (LOVENOX) injection  40 mg Subcutaneous QHS  . fluticasone  1 spray Each Nare Daily  . metaxalone  800 mg Oral BID  . pantoprazole  40 mg Oral QAC breakfast  . PARoxetine  20 mg Oral Daily  . pregabalin  50 mg Oral BID  . sodium chloride  3 mL Intravenous Q12H  . tamsulosin  0.4 mg Oral Daily  . tiotropium  18 mcg Inhalation Daily  . tiZANidine  4 mg Oral QHS    Infusions: . sodium chloride 75 mL/hr at 07/31/15 0127    No Known Allergies  Social History   Social History  . Marital Status: Married    Spouse Name: N/A  . Number of Children: N/A  . Years of Education: N/A   Occupational History  . Not on file.   Social History Main Topics  . Smoking  status: Never Smoker   . Smokeless tobacco: Never Used  . Alcohol Use: 1.2 oz/week    2 Cans of beer per week     Comment: 1-2 drinks a day  . Drug Use: No  . Sexual Activity: Not on file   Other Topics Concern  . Not on file   Social History Narrative    Family History  Problem Relation Age of Onset  . Hypertension Other     PHYSICAL EXAM: Filed Vitals:   07/31/15 0845  BP: 111/50  Pulse: 43  Temp: 98.2 F (36.8 C)  Resp: 18     Intake/Output Summary (Last 24 hours) at 07/31/15 1026 Last data filed at 07/31/15 0923  Gross per 24 hour  Intake 891.25 ml  Output    400 ml  Net 491.25 ml    General:  Well appearing. No respiratory difficulty HEENT: normal Neck: supple. no JVD. Carotids 2+ bilat; no bruits. No lymphadenopathy or thryomegaly appreciated. Cor: PMI nondisplaced. Regular rate & rhythm. No rubs, gallops or murmurs. Lungs: clear Abdomen: soft, nontender, nondistended. No hepatosplenomegaly. No bruits or masses. Good bowel sounds. Extremities: no cyanosis, clubbing, rash, edema Neuro: alert & oriented x 3, cranial nerves grossly intact. moves all 4 extremities w/o difficulty. Affect pleasant.  HKV:QQVZD bradycardia 49 BPM TWI inferolaterally. Tele reviewed overnight, Min HR 34, 2.0 second pause noted.   Results for orders placed or performed during the hospital encounter of 07/30/15 (from the past 24 hour(s))  Glucose, capillary     Status: None   Collection Time: 07/30/15  7:10 PM  Result Value Ref Range   Glucose-Capillary 88 65 - 99 mg/dL   Comment 1 Document in Chart    Comment 2 Repeat Test   CBC     Status: Abnormal   Collection Time: 07/30/15  7:16 PM  Result Value Ref Range   WBC 5.8 3.8 - 10.6 K/uL   RBC 4.77 4.40 - 5.90 MIL/uL   Hemoglobin 13.5 13.0 - 18.0 g/dL   HCT 63.8 75.6 - 43.3 %   MCV 84.1 80.0 - 100.0 fL   MCH 28.3 26.0 - 34.0 pg   MCHC 33.6 32.0 - 36.0 g/dL   RDW 29.5 (H) 18.8 - 41.6 %   Platelets 197 150 - 440 K/uL   Troponin I     Status: None   Collection Time: 07/30/15  7:16 PM  Result Value Ref Range   Troponin I <0.03 <0.031 ng/mL  Comprehensive metabolic panel     Status: Abnormal   Collection Time: 07/30/15  7:16 PM  Result Value  Ref Range   Sodium 137 135 - 145 mmol/L   Potassium 3.8 3.5 - 5.1 mmol/L   Chloride 107 101 - 111 mmol/L   CO2 21 (L) 22 - 32 mmol/L   Glucose, Bld 92 65 - 99 mg/dL   BUN 11 6 - 20 mg/dL   Creatinine, Ser 1.61 0.61 - 1.24 mg/dL   Calcium 8.4 (L) 8.9 - 10.3 mg/dL   Total Protein 6.1 (L) 6.5 - 8.1 g/dL   Albumin 3.6 3.5 - 5.0 g/dL   AST 34 15 - 41 U/L   ALT 20 17 - 63 U/L   Alkaline Phosphatase 43 38 - 126 U/L   Total Bilirubin 0.4 0.3 - 1.2 mg/dL   GFR calc non Af Amer 56 (L) >60 mL/min   GFR calc Af Amer >60 >60 mL/min   Anion gap 9 5 - 15  Magnesium     Status: None   Collection Time: 07/30/15  7:16 PM  Result Value Ref Range   Magnesium 1.9 1.7 - 2.4 mg/dL  Ethanol     Status: None   Collection Time: 07/30/15  7:16 PM  Result Value Ref Range   Alcohol, Ethyl (B) <5 <5 mg/dL  Basic metabolic panel     Status: Abnormal   Collection Time: 07/31/15 12:45 AM  Result Value Ref Range   Sodium 138 135 - 145 mmol/L   Potassium 3.9 3.5 - 5.1 mmol/L   Chloride 109 101 - 111 mmol/L   CO2 25 22 - 32 mmol/L   Glucose, Bld 113 (H) 65 - 99 mg/dL   BUN 11 6 - 20 mg/dL   Creatinine, Ser 0.96 0.61 - 1.24 mg/dL   Calcium 8.5 (L) 8.9 - 10.3 mg/dL   GFR calc non Af Amer >60 >60 mL/min   GFR calc Af Amer >60 >60 mL/min   Anion gap 4 (L) 5 - 15  Troponin I     Status: None   Collection Time: 07/31/15 12:45 AM  Result Value Ref Range   Troponin I <0.03 <0.031 ng/mL  Troponin I     Status: None   Collection Time: 07/31/15  6:38 AM  Result Value Ref Range   Troponin I <0.03 <0.031 ng/mL   Dg Chest 1 View  07/30/2015   CLINICAL DATA:  Syncope. No shortness of breath, pain, dizziness or diaphoresis. Bradycardia.  EXAM: CHEST 1 VIEW  COMPARISON:  10/15/2014   FINDINGS: The heart size and mediastinal contours are within normal limits. Both lungs are clear. The visualized skeletal structures are unremarkable.  IMPRESSION: No active disease.   Electronically Signed   By: Elige Ko   On: 07/30/2015 20:11     ASSESSMENT: syncope   PLAN/DISCUSSION: Continue to monitor on telemetry, PPM if pt meets parameters. Agree with rechecking echo today.    Patient and plan discussed with supervising provider, Dr. Adrian Blackwater, who agrees with above findings.   Alinda Sierras Margarito Courser [facility] 07/31/2015 10:26 AM    Consult Dr. Darrold Junker for PPM placement.

## 2015-07-31 NOTE — Progress Notes (Signed)
*  PRELIMINARY RESULTS* Echocardiogram 2D Echocardiogram has been performed.  Brian [name] Hege 07/31/2015, 11:48 AM

## 2015-07-31 NOTE — Progress Notes (Signed)
Pt. Arrived to unit via stretcher. Pt. Transferred to bed by himself with no assistance. Tele applied. Pt. Alert and oriented. General room orientation given including call bell system and ascoms, Family member at bedside. Skin assessment performed: skin is warm and dry. With no issues noted. Verifying RN, CB

## 2015-07-31 NOTE — Progress Notes (Addendum)
[facility] [place] [facility]   PATIENT NAME: Brian [name]    MR#:  [id number]  DATE OF BIRTH:  June 09, 1941  SUBJECTIVE:  Came in after having syncopal episode at home. Doing well. No complaints at present. dter at bedside  REVIEW OF SYSTEMS:   Review of Systems  Constitutional: Negative for fever, chills and weight loss.  HENT: Negative for ear discharge, ear pain and nosebleeds.   Eyes: Negative for blurred vision, pain and discharge.  Respiratory: Negative for sputum production, shortness of breath, wheezing and stridor.   Cardiovascular: Negative for chest pain, palpitations, orthopnea and PND.  Gastrointestinal: Negative for nausea, vomiting, abdominal pain and diarrhea.  Genitourinary: Negative for urgency and frequency.  Musculoskeletal: Negative for back pain and joint pain.  Neurological: Negative for sensory change, speech change, focal weakness and weakness.  Psychiatric/Behavioral: Negative for depression. The patient is not nervous/anxious.   All other systems reviewed and are negative.  Tolerating Diet:yes Tolerating PT: pending  DRUG ALLERGIES:  No Known Allergies  VITALS:  Blood pressure 156/73, pulse 44, temperature 97.7 F (36.5 C), temperature source Oral, resp. rate 18, height  (1.702 m), weight 70.897 kg (156 lb 4.8 oz), SpO2 96 %.  PHYSICAL EXAMINATION:   Physical Exam  GENERAL:  74 y.o.-year-old patient lying in the bed with no acute distress.  EYES: Pupils equal, round, reactive to light and accommodation. No scleral icterus. Extraocular muscles intact.  HEENT: Head atraumatic, normocephalic. Oropharynx and nasopharynx clear.  NECK:  Supple, no jugular venous distention. No thyroid enlargement, no tenderness.  LUNGS: Normal breath sounds bilaterally, no wheezing, rales, rhonchi. No use of accessory muscles of respiration.  CARDIOVASCULAR: S1, S2 normal. No murmurs, rubs, or gallops.  ABDOMEN: Soft, nontender,  nondistended. Bowel sounds present. No organomegaly or mass.  EXTREMITIES: No cyanosis, clubbing or edema b/l.    NEUROLOGIC: Cranial nerves II through XII are intact. No focal Motor or sensory deficits b/l.   PSYCHIATRIC: The patient is alert and oriented x 3.  SKIN: No obvious rash, lesion, or ulcer.    LABORATORY PANEL:   CBC  Recent Labs Lab 07/30/15 1916  WBC 5.8  HGB 13.5  HCT 40.1  PLT 197    Chemistries   Recent Labs Lab 07/30/15 1916 07/31/15 0045  NA 137 138  K 3.8 3.9  CL 107 109  CO2 21* 25  GLUCOSE 92 113*  BUN 11 11  CREATININE 1.24 1.12  CALCIUM 8.4* 8.5*  MG 1.9  --   AST 34  --   ALT 20  --   ALKPHOS 43  --   BILITOT 0.4  --     Cardiac Enzymes  Recent Labs Lab 07/31/15 1327  TROPONINI <0.03    RADIOLOGY:  Dg Chest 1 View  07/30/2015   CLINICAL DATA:  Syncope. No shortness of breath, pain, dizziness or diaphoresis. Bradycardia.  EXAM: CHEST 1 VIEW  COMPARISON:  10/15/2014  FINDINGS: The heart size and mediastinal contours are within normal limits. Both lungs are clear. The visualized skeletal structures are unremarkable.  IMPRESSION: No active disease.   Electronically Signed   By: Elige Ko   On: 07/30/2015 20:11     ASSESSMENT AND PLAN:  74 y.o. male with a known history of recurrent syncope status post extensive cardiac workup, HIV, bronchial asthma, sleep apnea on nocturnal O2, hyperlipidemia, depression, BPH, arthritis was brought in by EMS to the emergency room with the complaint of syncopal episode. According to  the patient's daughter-in-law who is with the patient at this time, patient passed out while sitting at home and they did have difficulty to arouse him hence called EMS who found the patient alert awake with stable vitals except for low heart rate around 50s  1. Syncopal episode. History of recurrent syncope status post extensive cardiac workup and per patient's family was negative. History of chronic bradycardia. All labs  normal except for creatinine of 1.24. Etiology of syncope? Cardiogenic,? Vasovagal,? Dehydration. Patient will likely need pacemaker insertion Spoke with Dr Welton Flakes. Dr Juel Burrow to place PM on Wednesday -HR 34-52 2. Acute kidney injury with creatinine of 1.24. Likely dehydration. Plan: Gentle IV hydration, follow-up BMP. 3. Bronchial asthma, stable on home medications. No acute problems. Continue home medications 4. Sleep apnea with history of nocturnal hypoxia-on O2 at night. No acute problems. Continue nocturnal O2 supplementation. 5. HIV, stable on home medications. No acute problems. Continue same. 6. Depression, stable on home medications. Continue same. 7. BPH, stable on home medications. No acute problems. Continue same  DVT prophylaxis: Subcutaneous Lovenox Case discussed with Care Management/Social Worker. Management plans discussed with the patient, family and they are in agreement.  CODE STATUS: full  TOTAL TIME TAKING CARE OF THIS PATIENT: 35 minutes.  >50% time spent on counselling and coordination of care pt, dter, dr Welton Flakes  POSSIBLE D/C IN 2-3 DAYS, DEPENDING ON CLINICAL CONDITION.   Walta Bellville M.D on 07/31/2015 at 10:27 PM  Between 7am to 6pm - Pager - [phone]  After 6pm go to [email or web address] - password [email or web address]  [facility]  Office  [phone]  CC: Primary care physician; Evelene Croon, MD

## 2015-08-01 MED ORDER — INFLUENZA VAC SPLIT QUAD 0.5 ML IM SUSY
0.5000 mL | PREFILLED_SYRINGE | INTRAMUSCULAR | Status: AC
  Administered 2015-08-03: 0.5 mL via INTRAMUSCULAR
  Filled 2015-08-01: qty 0.5

## 2015-08-01 MED ORDER — DEXTROSE-NACL 5-0.45 % IV SOLN
INTRAVENOUS | Status: DC
  Administered 2015-08-02 (×2): via INTRAVENOUS

## 2015-08-01 MED ORDER — CEFAZOLIN SODIUM 1-5 GM-% IV SOLN
1.0000 g | INTRAVENOUS | Status: AC
  Administered 2015-08-02: 1 g via INTRAVENOUS
  Filled 2015-08-01: qty 50

## 2015-08-01 MED ORDER — PNEUMOCOCCAL VAC POLYVALENT 25 MCG/0.5ML IJ INJ
0.5000 mL | INJECTION | INTRAMUSCULAR | Status: AC
  Administered 2015-08-03: 0.5 mL via INTRAMUSCULAR
  Filled 2015-08-01: qty 0.5

## 2015-08-01 NOTE — Consult Note (Signed)
74 patient is known to have asthma syncope sleep apnea depression BPH. he has a history of passing out spell about 3 time with loss of conciousness 6 month ago and then he had about 9 months ago when he passed out' often hurt his back  He has cardiac workup which was unremarkable including CT of the C-spine chest x-ray echocardiogram carotid scan. His electrolytes are unremarkable as well as a hemoglobin and hematocrit .  ultrasound off the carotid did not show any obstruction.   Next  on physical examination patient is well-nourished  male is Spanish-speaking in no acute distress .head is normal. neck is supple  no jugular venous pressure elevation is .notedl no carotid bruit  on examination of the cardiovascular system apical impulse is palpable in the fifth intercostal space  . Resp system normal first and second heart sound is normal, no murmur is audible chest examination reveals decreased breath sound without any rales or rhonchi  abdomen is soft nontender without any hepatosplenomegaly does not have edema no calf  Tenderness   lab data is unremarkable examination of the telemetry data EKG shows severe sinus and junctional bradycardia with heart rate dropping to 42 and 40  Neurologic examination is unremarkable without any focal neurological signs    Assessment and plan  patient is known to have syncope event , junctional and sinus bradycardia He has 2 episodes of syncope in the past  and completely negative workup for the cardiology and has been admitted in this hospital at least twice  he has asthma and sleep apnea depression and benign prostatic hypertrophy along with arthritis and neuropathy. He is a social drinker and is a nonsmoker  I recommend the patient to undergo permanent pacemaker insertion and informed consent was obtained from the patient and procedure was explained to the family in detail with the help of diagrams and he seemed to understand it   both carotid arteries  vertebral arteries were patent.

## 2015-08-01 NOTE — Progress Notes (Signed)
[facility] - [place] at [facility]   PATIENT NAME: Brian [name]    MR#:  [id number]  DATE OF BIRTH:  April 12, 1941  SUBJECTIVE:  Via interpreter Doing well. No complaints at present. dter at bedside questions regarding pacemaker answered all of his results is  REVIEW OF SYSTEMS:   Review of Systems  Constitutional: Negative for fever, chills and weight loss.  HENT: Negative for ear discharge, ear pain and nosebleeds.   Eyes: Negative for blurred vision, pain and discharge.  Respiratory: Negative for sputum production, shortness of breath, wheezing and stridor.   Cardiovascular: Negative for chest pain, palpitations, orthopnea and PND.  Gastrointestinal: Negative for nausea, vomiting, abdominal pain and diarrhea.  Genitourinary: Negative for urgency and frequency.  Musculoskeletal: Negative for back pain and joint pain.  Neurological: Negative for sensory change, speech change, focal weakness and weakness.  Psychiatric/Behavioral: Negative for depression. The patient is not nervous/anxious.   All other systems reviewed and are negative.  Tolerating Diet:yes Tolerating PT: pending  DRUG ALLERGIES:  No Known Allergies  VITALS:  Blood pressure 104/51, pulse 49, temperature 98 F (36.7 C), temperature source Oral, resp. rate 18, height  (1.702 m), weight 71.169 kg (156 lb 14.4 oz), SpO2 96 %.  PHYSICAL EXAMINATION:   Physical Exam  GENERAL:  74 y.o.-year-old patient lying in the bed with no acute distress.  EYES: Pupils equal, round, reactive to light and accommodation. No scleral icterus. Extraocular muscles intact.  HEENT: Head atraumatic, normocephalic. Oropharynx and nasopharynx clear.  NECK:  Supple, no jugular venous distention. No thyroid enlargement, no tenderness.  LUNGS: Normal breath sounds bilaterally, no wheezing, rales, rhonchi. No use of accessory muscles of respiration.  CARDIOVASCULAR: S1, S2 normal. No murmurs, rubs, or gallops.   ABDOMEN: Soft, nontender, nondistended. Bowel sounds present. No organomegaly or mass.  EXTREMITIES: No cyanosis, clubbing or edema b/l.    NEUROLOGIC: Cranial nerves II through XII are intact. No focal Motor or sensory deficits b/l.   PSYCHIATRIC: The patient is alert and oriented x 3.  SKIN: No obvious rash, lesion, or ulcer.    LABORATORY PANEL:   CBC  Recent Labs Lab 07/30/15 1916  WBC 5.8  HGB 13.5  HCT 40.1  PLT 197    Chemistries   Recent Labs Lab 07/30/15 1916 07/31/15 0045  NA 137 138  K 3.8 3.9  CL 107 109  CO2 21* 25  GLUCOSE 92 113*  BUN 11 11  CREATININE 1.24 1.12  CALCIUM 8.4* 8.5*  MG 1.9  --   AST 34  --   ALT 20  --   ALKPHOS 43  --   BILITOT 0.4  --     Cardiac Enzymes  Recent Labs Lab 07/31/15 1327  TROPONINI <0.03    RADIOLOGY:  Dg Chest 1 View  07/30/2015   CLINICAL DATA:  Syncope. No shortness of breath, pain, dizziness or diaphoresis. Bradycardia.  EXAM: CHEST 1 VIEW  COMPARISON:  10/15/2014  FINDINGS: The heart size and mediastinal contours are within normal limits. Both lungs are clear. The visualized skeletal structures are unremarkable.  IMPRESSION: No active disease.   Electronically Signed   By: Elige Ko   On: 07/30/2015 20:11     ASSESSMENT AND PLAN:  74 y.o. male with a known history of recurrent syncope status post extensive cardiac workup, HIV, bronchial asthma, sleep apnea on nocturnal O2, hyperlipidemia, depression, BPH, arthritis was brought in by EMS to the emergency room with the complaint of syncopal  episode. According to the patient's daughter-in-law who is with the patient at this time, patient passed out while sitting at home and they did have difficulty to arouse him hence called EMS who found the patient alert awake with stable vitals except for low heart rate around 50s  1. Syncopal episode. History of recurrent syncope status post extensive cardiac workup and per patient's family was negative. History of  chronic bradycardia. All labs normal except for creatinine of 1.24. Etiology of syncope? Cardiogenic,? Vasovagal,? Dehydration. Patient will likely need pacemaker insertion Spoke with Dr Welton Flakes. Dr Juel Burrow to place PM on Wednesday -HR 34-52  2. Acute kidney injury with creatinine of 1.24. Likely dehydration. Received IV hydration. Appears well-hydrated DC IV fluids   3. Bronchial asthma, stable on home medications. No acute problems. Continue home medications  4. Sleep apnea with history of nocturnal hypoxia-on O2 at night. No acute problems. Continue nocturnal O2 supplementation.  5. HIV, stable on home medications. No acute problems.   6. Depression, stable on home medications.   7. BPH, stable on home medications. No acute problems.  DVT prophylaxis: Subcutaneous Lovenox Case discussed with Care Management/Social Worker. Management plans discussed with the patient, family and they are in agreement.  CODE STATUS: full  TOTAL TIME TAKING CARE OF THIS PATIENT: 35 minutes.  >50% time spent on counselling and coordination of care pt, dter, dr Welton Flakes  POSSIBLE D/C IN 2-3 DAYS, DEPENDING ON CLINICAL CONDITION.   Brian [name] M.D on 08/01/2015 at 1:12 PM  Between 7am to 6pm - Pager - [phone]  After 6pm go to [email or web address] - password [email or web address] [facility]  [place] [facility]  Office  [phone]  CC: Primary care physician; Evelene Croon, MD

## 2015-08-01 NOTE — Evaluation (Signed)
Physical Therapy Evaluation Patient Details Name: Brian [name] MRN: [id number] DOB: 1941/03/25 Today's Date: 08/01/2015   History of Present Illness  Patient is a 74 y/o male that presents after multiple episodes of fainting or "passing out". Per family and patient report he begins to lose consciousness, does not experience spinning/vertigo, and does not seem to be correlated with positional changes. Patient past medical history significant for AKI, HIV.  Clinical Impression  Patient states he has had multiple episodes of feeling tired and then fainting or passing out. Patient did display orthostatics today with mildly elevating HR throughout transfers during this session, however he did not become symptomatic. PT attempted to have patient perform significant exercise (2 laps at "fast" speed and ~ 20 sit to stands) to assess for tachycardia with exercise, none noted. It appears orthostatics may be playing a role in these dizziness/fainting episodes, however per his report he has been up moving around when these events happen. It was not reproducible today, and he was clearly not symptomatic with his mobility performance. At this time PT is not indicated for this patient as he is at his baseline mobility, further testing may be indicated for his bradycardia (HR 49 at rest) and orthostatic origin (given that he is so active at his baseline).     Follow Up Recommendations No PT follow up    Equipment Recommendations       Recommendations for Other Services       Precautions / Restrictions Precautions Precautions: Fall Restrictions Weight Bearing Restrictions: No      Mobility  Bed Mobility Overal bed mobility: Modified Independent             General bed mobility comments: HOB elevated, no cuing required.   Transfers Overall transfer level: Independent               General transfer comment: Patient able to complete ~ 20 sit to stands consecutively with no assistance.    Ambulation/Gait Ambulation/Gait assistance: Independent Ambulation Distance (Feet): 360 Feet   Gait Pattern/deviations: WFL(Within Functional Limits)   Gait velocity interpretation: at or above normal speed for age/gender General Gait Details: Patient ambulated with one episode of increased HR to 82, otherwise maintained in 62-69  Stairs            Wheelchair Mobility    Modified Rankin (Stroke Patients Only)       Balance Overall balance assessment: Independent                                           Pertinent Vitals/Pain Pain Assessment: No/denies pain    Home Living Family/patient expects to be discharged to:: Private residence Living Arrangements: Alone Available Help at Discharge: Family (Daughter will be moving to a larger house within the month and would let patient move in with her. ) Type of Home: House Home Access: Level entry     Home Layout: One level Home Equipment: Cane - single point      Prior Function Level of Independence: Independent         Comments: Patient is very active exercises daily and walks in the park.      Hand Dominance        Extremity/Trunk Assessment   Upper Extremity Assessment: Overall WFL for tasks assessed           Lower Extremity Assessment: Overall WFL for  tasks assessed         Communication   Communication: Interpreter utilized Best boy interpreter)  Cognition Arousal/Alertness: Awake/alert Behavior During Therapy: WFL for tasks assessed/performed Overall Cognitive Status: Within Functional Limits for tasks assessed                      General Comments      Exercises        Assessment/Plan    PT Assessment Patent does not need any further PT services  PT Diagnosis     PT Problem List    PT Treatment Interventions     PT Goals (Current goals can be found in the Care Plan section) Acute Rehab PT Goals Patient Stated Goal: To figure out why he is  passing out.  PT Goal Formulation: With patient/family Time For Goal Achievement: 08/15/15 Potential to Achieve Goals: Good    Frequency     Barriers to discharge        Co-evaluation               End of Session Equipment Utilized During Treatment: Gait belt Activity Tolerance: Patient tolerated treatment well Patient left: in bed;with bed alarm set;with call bell/phone within reach;with family/visitor present Nurse Communication: Mobility status         Time: 5409-8119 PT Time Calculation (min) (ACUTE ONLY): 28 min   Charges:   PT Evaluation $Initial PT Evaluation Tier I: 1 Procedure     PT G Codes:       Kerin Ransom, PT, DPT    08/01/2015, 10:25 AM

## 2015-08-01 NOTE — Progress Notes (Signed)
   SUBJECTIVE: Pt sitting up comfortably in bed eating breakfast. No CP or SOB   Filed Vitals:   07/31/15 1629 07/31/15 2046 08/01/15 0422 08/01/15 0617  BP: 117/63 156/73 104/51   Pulse: 52 44 75   Temp:  97.7 F (36.5 C) 98 F (36.7 C)   TempSrc:  Oral Oral   Resp:  18 18   Height:      Weight:    71.169 kg (156 lb 14.4 oz)  SpO2:  96% 94%     Intake/Output Summary (Last 24 hours) at 08/01/15 0906 Last data filed at 08/01/15 0617  Gross per 24 hour  Intake 1416.25 ml  Output      0 ml  Net 1416.25 ml    LABS: Basic Metabolic Panel:  Recent Labs  60/45/40 1916 07/31/15 0045  NA 137 138  K 3.8 3.9  CL 107 109  CO2 21* 25  GLUCOSE 92 113*  BUN 11 11  CREATININE 1.24 1.12  CALCIUM 8.4* 8.5*  MG 1.9  --    Liver Function Tests:  Recent Labs  07/30/15 1916  AST 34  ALT 20  ALKPHOS 43  BILITOT 0.4  PROT 6.1*  ALBUMIN 3.6   No results for input(s): LIPASE, AMYLASE in the last 72 hours. CBC:  Recent Labs  07/30/15 1916  WBC 5.8  HGB 13.5  HCT 40.1  MCV 84.1  PLT 197   Cardiac Enzymes:  Recent Labs  07/31/15 0045 07/31/15 0638 07/31/15 1327  TROPONINI <0.03 <0.03 <0.03   BNP: Invalid input(s): POCBNP D-Dimer: No results for input(s): DDIMER in the last 72 hours. Hemoglobin A1C: No results for input(s): HGBA1C in the last 72 hours. Fasting Lipid Panel: No results for input(s): CHOL, HDL, LDLCALC, TRIG, CHOLHDL, LDLDIRECT in the last 72 hours. Thyroid Function Tests: No results for input(s): TSH, T4TOTAL, T3FREE, THYROIDAB in the last 72 hours.  Invalid input(s): FREET3 Anemia Panel: No results for input(s): VITAMINB12, FOLATE, FERRITIN, TIBC, IRON, RETICCTPCT in the last 72 hours.   PHYSICAL EXAM General: Well developed, well nourished, in no acute distress HEENT:  Normocephalic and atramatic Neck:  No JVD.  Lungs: Clear bilaterally to auscultation and percussion. Heart: bradycardic  Abdomen: Bowel sounds are positive, abdomen  soft and non-tender  Msk:  Back normal, normal gait. Normal strength and tone for age. Extremities: No clubbing, cyanosis or edema.   Neuro: Alert and oriented X 3. Psych:  Good affect, responds appropriately  TELEMETRY: Reviewed telemetry pt in sinus bradycardia  ASSESSMENT AND PLAN: symptomatic bradycardia and pauses less than 2.1 seconds. Pts HR remains in 40s, Min 36. Two episodes of syncope and fatigue/malaise. PPM to be placed on Wednesday by Dr. Juel Burrow.     Patient and plan discussed with supervising provider, Dr. Adrian Blackwater, who agrees with above findings.   Alinda Sierras Margarito Courser [facility]  08/01/2015 9:06 AM

## 2015-08-01 NOTE — Progress Notes (Signed)
RN updated Dr. Allena Katz on rounds about (+)orthostatics, pt remains asymptomatic and walked several times around nurses station with PT this AM. Per MD, discontinue Qshift orthostatics since pt is asymptomatic.

## 2015-08-02 ENCOUNTER — Inpatient Hospital Stay: Payer: Medicare Other | Admitting: Anesthesiology

## 2015-08-02 ENCOUNTER — Other Ambulatory Visit: Payer: Self-pay | Admitting: Internal Medicine

## 2015-08-02 ENCOUNTER — Inpatient Hospital Stay: Payer: Medicare Other

## 2015-08-02 ENCOUNTER — Encounter: Payer: Self-pay | Admitting: Anesthesiology

## 2015-08-02 ENCOUNTER — Encounter
Admission: EM | Disposition: A | Discharge status: Home / Self Care | Payer: Self-pay | Source: Home / Self Care | Attending: Internal Medicine

## 2015-08-02 HISTORY — PX: PACEMAKER INSERTION: SHX728

## 2015-08-02 LAB — CBC
HCT: 42.8 % (ref 40.0–52.0)
Hemoglobin: 14.4 g/dL (ref 13.0–18.0)
MCH: 28.4 pg (ref 26.0–34.0)
MCHC: 33.7 g/dL (ref 32.0–36.0)
MCV: 84.1 fL (ref 80.0–100.0)
Platelets: 185 10*3/uL (ref 150–440)
RBC: 5.09 MIL/uL (ref 4.40–5.90)
RDW: 15.2 % — ABNORMAL HIGH (ref 11.5–14.5)
WBC: 9.6 10*3/uL (ref 3.8–10.6)

## 2015-08-02 LAB — PROTIME-INR
INR: 1.11
Prothrombin Time: 14.5 seconds (ref 11.4–15.0)

## 2015-08-02 SURGERY — INSERTION, CARDIAC PACEMAKER
Anesthesia: Monitor Anesthesia Care

## 2015-08-02 MED ORDER — LIDOCAINE HCL 1.5 % IJ SOLN
INTRAMUSCULAR | Status: DC | PRN
  Administered 2015-08-02: 19 mL

## 2015-08-02 MED ORDER — SODIUM CHLORIDE 0.9 % IJ SOLN: 3.0000 mL | INTRAMUSCULAR | Status: DC | PRN

## 2015-08-02 MED ORDER — LIDOCAINE HCL (PF) 1 % IJ SOLN
2.0000 mL | Freq: Once | INTRAMUSCULAR | Status: DC
  Filled 2015-08-02: qty 2

## 2015-08-02 MED ORDER — FENTANYL CITRATE (PF) 100 MCG/2ML IJ SOLN
INTRAMUSCULAR | Status: DC | PRN
  Administered 2015-08-02: 50 ug via INTRAVENOUS

## 2015-08-02 MED ORDER — SODIUM CHLORIDE 0.9 % IR SOLN
Status: DC | PRN
  Administered 2015-08-02: 50 mL

## 2015-08-02 MED ORDER — SODIUM CHLORIDE 0.9 % IJ SOLN: 3.0000 mL | Freq: Two times a day (BID) | INTRAMUSCULAR | Status: DC

## 2015-08-02 MED ORDER — MIDAZOLAM HCL 2 MG/2ML IJ SOLN
INTRAMUSCULAR | Status: DC | PRN
  Administered 2015-08-02 (×2): 0.5 mg via INTRAVENOUS
  Administered 2015-08-02: 1 mg via INTRAVENOUS

## 2015-08-02 MED ORDER — ONDANSETRON HCL 4 MG/2ML IJ SOLN: 4.0000 mg | Freq: Once | INTRAMUSCULAR | Status: DC | PRN

## 2015-08-02 MED ORDER — PROPOFOL 500 MG/50ML IV EMUL
INTRAVENOUS | Status: DC | PRN
  Administered 2015-08-02: 100 ug/kg/min via INTRAVENOUS

## 2015-08-02 MED ORDER — LIDOCAINE HCL (PF) 1 % IJ SOLN
INTRAMUSCULAR | Status: AC
  Filled 2015-08-02: qty 30

## 2015-08-02 MED ORDER — FENTANYL CITRATE (PF) 100 MCG/2ML IJ SOLN: 25.0000 ug | INTRAMUSCULAR | Status: DC | PRN

## 2015-08-02 MED ORDER — EPHEDRINE SULFATE 50 MG/ML IJ SOLN
INTRAMUSCULAR | Status: DC | PRN
  Administered 2015-08-02 (×3): 5 mg via INTRAVENOUS
  Administered 2015-08-02: 10 mg via INTRAVENOUS
  Administered 2015-08-02 (×3): 5 mg via INTRAVENOUS
  Administered 2015-08-02: 10 mg via INTRAVENOUS

## 2015-08-02 MED ORDER — SODIUM CHLORIDE 0.9 % IV SOLN
INTRAVENOUS | Status: DC | PRN
  Administered 2015-08-02: 10 mL via INTRAMUSCULAR

## 2015-08-02 MED ORDER — SODIUM CHLORIDE 0.9 % IV SOLN: 250.0000 mL | INTRAVENOUS | Status: DC | PRN

## 2015-08-02 MED ORDER — OXYCODONE HCL 5 MG PO TABS
5.0000 mg | ORAL_TABLET | ORAL | Status: DC | PRN
  Administered 2015-08-02 – 2015-08-03 (×3): 10 mg via ORAL
  Administered 2015-08-03: 5 mg via ORAL
  Filled 2015-08-02 (×2): qty 2
  Filled 2015-08-02: qty 1
  Filled 2015-08-02: qty 2

## 2015-08-02 SURGICAL SUPPLY — 43 items
2-0 VICRYL IMPLANT
4-0 VICRYL IMPLANT
ADULT ELECTRODES ×2 IMPLANT
BLADE SURG 15 STRL LF DISP TIS (BLADE) IMPLANT
BLADE SURG 15 STRL SS (BLADE)
CABLE SURG 12 DISP A/V CHANNEL (MISCELLANEOUS) ×2 IMPLANT
CANISTER SUCT 1200ML W/VALVE (MISCELLANEOUS) ×2 IMPLANT
CHLORAPREP W/TINT 26ML (MISCELLANEOUS) ×2 IMPLANT
COVER LIGHT HANDLE STERIS (MISCELLANEOUS) ×4 IMPLANT
COVER MAYO STAND STRL (DRAPES) ×2 IMPLANT
DRAPE C-ARM XRAY 36X54 (DRAPES) ×2 IMPLANT
DRESSING TELFA 4X3 1S ST N-ADH (GAUZE/BANDAGES/DRESSINGS) ×2 IMPLANT
DRSG TEGADERM 4X4.75 (GAUZE/BANDAGES/DRESSINGS) ×2 IMPLANT
GLOVE BIO SURGEON STRL SZ7 (GLOVE) ×6 IMPLANT
GOWN STRL REUS W/ TWL LRG LVL3 (GOWN DISPOSABLE) ×3 IMPLANT
GOWN STRL REUS W/TWL LRG LVL3 (GOWN DISPOSABLE) ×3
IMMOBILIZER SHDR LG LX 900803 (SOFTGOODS) ×2 IMPLANT
INTRO PACEMAKR LEAD 7FR 23CM (INTRODUCER)
INTRO PACEMAKR LEAD 9FR 23CM (INTRODUCER)
INTRODUCER PACEMKR LD 7FR 23CM (INTRODUCER) IMPLANT
INTRODUCER PACEMKR LD 9FR 23CM (INTRODUCER) IMPLANT
KIT RM TURNOVER STRD PROC AR (KITS) ×2 IMPLANT
LABEL OR SOLS (LABEL) ×2 IMPLANT
LEAD CAPSURE NOVUS 5092-52CM (Lead) ×2 IMPLANT
LEAD CAPSURE NOVUS 5592-45CM (Lead) ×2 IMPLANT
LEAD INTRODUCER 7FR 23CM (INTRODUCER) ×4 IMPLANT
LEAD INTRODUCER 9FR 23CM (INTRODUCER) ×4 IMPLANT
LIQUID BAND (GAUZE/BANDAGES/DRESSINGS) ×2 IMPLANT
NDL SAFETY 25GX1.5 (NEEDLE) ×2 IMPLANT
NS IRRIG 500ML POUR BTL (IV SOLUTION) ×2 IMPLANT
PACEMAKER ADAPTA DR ADDR01 (Pacemaker) ×1 IMPLANT
PACK PACE INSERTION (MISCELLANEOUS) ×2 IMPLANT
PAD GROUND ADULT SPLIT (MISCELLANEOUS) ×2 IMPLANT
PPM ADAPTA DR ADDR01 (Pacemaker) ×2 IMPLANT
SUCTION FRAZIER TIP 10 FR DISP (SUCTIONS) ×2 IMPLANT
SUT SILK 2 0 SH (SUTURE) IMPLANT
SUT SILK 3 0 (SUTURE)
SUT SILK 3-0 (SUTURE) ×4 IMPLANT
SUT SILK 3-0 18XBRD TIE 12 (SUTURE) IMPLANT
SUT SILK 4 0 SH (SUTURE) IMPLANT
SUT VIC AB 2-0 SH 27 (SUTURE) ×1
SUT VIC AB 2-0 SH 27XBRD (SUTURE) ×1 IMPLANT
SUT VIC AB 4-0 FS2 27 (SUTURE) ×2 IMPLANT

## 2015-08-02 NOTE — Progress Notes (Signed)
Pt received from OR with bloody dressing, dressing changed at this time, will continue to assess

## 2015-08-02 NOTE — Progress Notes (Signed)
Pt surgical dressing bloody and copious at shift change. MD notified and MD arrived to assess patient. Md stitched surgical site at bedside. Will monitor site. Pt have surgical pain occassionally. Medicated as needed. Daughter at beside. Will continue to monitor.

## 2015-08-02 NOTE — Progress Notes (Signed)
Notified MD about pts dressing that continues to saturate multiple dressings despite manual pressure being held, MD notified, family at bedside, will pass on findings to oncoming nurse

## 2015-08-02 NOTE — Progress Notes (Signed)
[facility] - [place] at [facility]   PATIENT NAME: Brian [name]    MR#:  [id number]  DATE OF BIRTH:  Mar 14, 1941  SUBJECTIVE:  Via interpreter Doing well. No complaints at present. dter at bedside questions regarding pacemaker answered all of his questions  REVIEW OF SYSTEMS:   Review of Systems  Constitutional: Negative for fever, chills and weight loss.  HENT: Negative for ear discharge, ear pain and nosebleeds.   Eyes: Negative for blurred vision, pain and discharge.  Respiratory: Negative for sputum production, shortness of breath, wheezing and stridor.   Cardiovascular: Negative for chest pain, palpitations, orthopnea and PND.  Gastrointestinal: Negative for nausea, vomiting, abdominal pain and diarrhea.  Genitourinary: Negative for urgency and frequency.  Musculoskeletal: Negative for back pain and joint pain.  Neurological: Negative for sensory change, speech change, focal weakness and weakness.  Psychiatric/Behavioral: Negative for depression. The patient is not nervous/anxious.   All other systems reviewed and are negative.  Tolerating Diet:yes Tolerating PT: pending  DRUG ALLERGIES:  No Known Allergies  VITALS:  Blood pressure 115/69, pulse 47, temperature 97.9 F (36.6 C), temperature source Oral, resp. rate 16, height  (1.702 m), weight 67.722 kg (149 lb 4.8 oz), SpO2 94 %.  PHYSICAL EXAMINATION:   Physical Exam  GENERAL:  74 y.o.-year-old patient lying in the bed with no acute distress.  EYES: Pupils equal, round, reactive to light and accommodation. No scleral icterus. Extraocular muscles intact.  HEENT: Head atraumatic, normocephalic. Oropharynx and nasopharynx clear.  NECK:  Supple, no jugular venous distention. No thyroid enlargement, no tenderness.  LUNGS: Normal breath sounds bilaterally, no wheezing, rales, rhonchi. No use of accessory muscles of respiration.  CARDIOVASCULAR: S1, S2 normal. No murmurs, rubs, or gallops.   ABDOMEN: Soft, nontender, nondistended. Bowel sounds present. No organomegaly or mass.  EXTREMITIES: No cyanosis, clubbing or edema b/l.    NEUROLOGIC: Cranial nerves II through XII are intact. No focal Motor or sensory deficits b/l.   PSYCHIATRIC: The patient is alert and oriented x 3.  SKIN: No obvious rash, lesion, or ulcer.    LABORATORY PANEL:   CBC  Recent Labs Lab 07/30/15 1916  WBC 5.8  HGB 13.5  HCT 40.1  PLT 197    Chemistries   Recent Labs Lab 07/30/15 1916 07/31/15 0045  NA 137 138  K 3.8 3.9  CL 107 109  CO2 21* 25  GLUCOSE 92 113*  BUN 11 11  CREATININE 1.24 1.12  CALCIUM 8.4* 8.5*  MG 1.9  --   AST 34  --   ALT 20  --   ALKPHOS 43  --   BILITOT 0.4  --     Cardiac Enzymes  Recent Labs Lab 07/31/15 1327  TROPONINI <0.03    RADIOLOGY:  No results found.   ASSESSMENT AND PLAN:  74 y.o. male with a known history of recurrent syncope status post extensive cardiac workup, HIV, bronchial asthma, sleep apnea on nocturnal O2, hyperlipidemia, depression, BPH, arthritis was brought in by EMS to the emergency room with the complaint of syncopal episode. According to the patient's daughter-in-law who is with the patient at this time, patient passed out while sitting at home and they did have difficulty to arouse him hence called EMS who found the patient alert awake with stable vitals except for low heart rate around 50s  1. Syncopal episode. History of recurrent syncope status post extensive cardiac workup and per patient's family was negative. History of chronic bradycardia. All  labs normal except for creatinine of 1.24. Etiology of syncope? Cardiogenic,? Vasovagal,? Dehydration. Patient to get pacemaker today Spoke with Dr Welton Flakes. Dr Juel Burrow to place PM today -HR 34-52  2. Acute kidney injury with creatinine of 1.24. Likely dehydration. Received IV hydration. Appears well-hydrated DC IV fluids   3. Bronchial asthma, stable on home medications. No  acute problems. Continue home medications  4. Sleep apnea with history of nocturnal hypoxia-on O2 at night. No acute problems. Continue nocturnal O2 supplementation.  5. HIV, stable on home medications. No acute problems.   6. Depression, stable on home medications.   7. BPH, stable on home medications. No acute problems.  DVT prophylaxis: Subcutaneous Lovenox Case discussed with Care Management/Social Worker. Management plans discussed with the patient, family and they are in agreement.  CODE STATUS: full  TOTAL TIME TAKING CARE OF THIS PATIENT: 35 minutes.  >50% time spent on counselling and coordination of care pt, dter, dr Welton Flakes  POSSIBLE D/C IN 2-3 DAYS, DEPENDING ON CLINICAL CONDITION.   Ka Flammer M.D on 08/02/2015 at 2:31 PM  Between 7am to 6pm - Pager - [phone]  After 6pm go to [email or web address] - password [email or web address] [facility]  [place] [facility]  Office  [phone]  CC: Primary care physician; Evelene Croon, MD

## 2015-08-02 NOTE — Care Management Important Message (Signed)
Important Message  Patient Details  Name: Brian [name] MRN: [id number] Date of Birth: Apr 21, 1941   Medicare Important Message Given:  Yes-third notification given    Collie Siad, RN 08/02/2015, 1:39 PM

## 2015-08-02 NOTE — Progress Notes (Signed)
   SUBJECTIVE: Pt resting comfortably.    Filed Vitals:   08/01/15 1340 08/01/15 2016 08/02/15 0531 08/02/15 1127  BP: 144/74 141/69 108/57 115/69  Pulse: 51 47 48 47  Temp: 98.2 F (36.8 C) 98.2 F (36.8 C) 98.6 F (37 C) 97.9 F (36.6 C)  TempSrc: Oral  Oral Oral  Resp: Height:      Weight:   67.722 kg (149 lb 4.8 oz)   SpO2: 97% 97% 99% 94%    Intake/Output Summary (Last 24 hours) at 08/02/15 1208 Last data filed at 08/02/15 0900  Gross per 24 hour  Intake    240 ml  Output      0 ml  Net    240 ml    LABS: Basic Metabolic Panel:  Recent Labs  16/10/96 1916 07/31/15 0045  NA 137 138  K 3.8 3.9  CL 107 109  CO2 21* 25  GLUCOSE 92 113*  BUN 11 11  CREATININE 1.24 1.12  CALCIUM 8.4* 8.5*  MG 1.9  --    Liver Function Tests:  Recent Labs  07/30/15 1916  AST 34  ALT 20  ALKPHOS 43  BILITOT 0.4  PROT 6.1*  ALBUMIN 3.6   No results for input(s): LIPASE, AMYLASE in the last 72 hours. CBC:  Recent Labs  07/30/15 1916  WBC 5.8  HGB 13.5  HCT 40.1  MCV 84.1  PLT 197   Cardiac Enzymes:  Recent Labs  07/31/15 0045 07/31/15 0638 07/31/15 1327  TROPONINI <0.03 <0.03 <0.03   BNP: Invalid input(s): POCBNP D-Dimer: No results for input(s): DDIMER in the last 72 hours. Hemoglobin A1C: No results for input(s): HGBA1C in the last 72 hours. Fasting Lipid Panel: No results for input(s): CHOL, HDL, LDLCALC, TRIG, CHOLHDL, LDLDIRECT in the last 72 hours. Thyroid Function Tests: No results for input(s): TSH, T4TOTAL, T3FREE, THYROIDAB in the last 72 hours.  Invalid input(s): FREET3 Anemia Panel: No results for input(s): VITAMINB12, FOLATE, FERRITIN, TIBC, IRON, RETICCTPCT in the last 72 hours.   PHYSICAL EXAM General: Well developed, well nourished, in no acute distress HEENT: Normocephalic and atramatic Neck: No JVD.  Lungs: Clear bilaterally to auscultation and percussion. Heart: bradycardic  Abdomen: Bowel sounds are  positive, abdomen soft and non-tender  Msk: Back normal, normal gait. Normal strength and tone for age. Extremities: No clubbing, cyanosis or edema.  Neuro: Alert and oriented X 3. Psych: Good affect, responds appropriately  TELEMETRY: Reviewed telemetry pt in sinus bradycardia  ASSESSMENT AND PLAN: symptomatic bradycardia and pauses less than 2.1 seconds. Pts HR remains in 40s, Min 36. Two episodes of syncope and fatigue/malaise. PPM to be placed today by Dr. Juel Burrow.  Patient and plan discussed with supervising provider, Dr. Adrian Blackwater, who agrees with above findings.   Alinda Sierras Margarito Courser [facility]  08/02/2015 12:08 PM

## 2015-08-02 NOTE — Transfer of Care (Signed)
Immediate Anesthesia Transfer of Care Note  Patient: Brian [name]  Procedure(s) Performed: Procedure(s): INSERTION PACEMAKER (N/A)  Patient Location: PACU  Anesthesia Type:General  Level of Consciousness: sedated  Airway & Oxygen Therapy: Patient Spontanous Breathing and Patient connected to face mask oxygen  Post-op Assessment: Report given to RN and Post -op Vital signs reviewed and stable  Post vital signs: Reviewed and stable  Last Vitals:  Filed Vitals:   08/02/15 1127  BP: 115/69  Pulse: 47  Temp: 36.6 C  Resp: 16    Complications: No apparent anesthesia complications

## 2015-08-02 NOTE — Op Note (Signed)
Dr. Lavera Guise dictating the operative note on patient name  Brian [name]  preop diagnosis  sinus bradycardia junctional bradycardia syncope.  Postop diagnosis  same  Attending physician  Dr. Chancy Milroy clinical data  pacemaker Medtronic Adapta A DD R01  serial number and N [id number]  Procedure note  patient was taken to the operative room and left upper chest was prepared prepared with standard  protocal and local lidocaine was used for anesthesia  next an incision was made on the skin overlying the deltopectoral groove, cephalic vein dissection was done venous cutdown was performed and pacemaker catheter was introduced through the cephalic vein but optimal position could not be obtained at the apex to the right ventricle, it caused diaphragmatic stimulation so this procedure was aborted and cephalic vein was ligated  and next patient was put in Trendelenburg position and left subclavian stick was done  one for the atrial lead 1 for the ventricular lead  ventricular lead was introduced through the introducer kit and optimum position was obtained of the floor of the right ventricle and stimulation thresholds were found to be satisfactory so the lead was tied to the pectoralis major muscle on the top of the suture sleeve next the atrial lead was introduced with a separate stick to the left subclavian vein and a atrial lead was positioned in the right atrial appendage stimulation thresholds were found to be satisfactory . that both leads were passed through the tunnel under the superficial fascia and both leads were tied to the pectoralais major muscle separately next the pacemaker was selected made by [employer] had a pacemaker model is Adapta A DD R01 both lead was introduced into the pacemaker making sure that atrial leads course into the atrial channel ventricle lead goes into the ventricular channels, both leads were properly tightened and then the pacemaker pocket was created under the superficial  fascia pacemaker was put in the pocket and subcutaneous tissues was sutured with Vicryl and silk, dressing was applied and patient was returned to the recovery room in a stable position chest x-ray will be obtained in the recovery room family was notified about the progress of the patient

## 2015-08-02 NOTE — Anesthesia Preprocedure Evaluation (Signed)
Anesthesia Evaluation  Patient identified by MRN, date of birth, ID band Patient awake    Reviewed: Allergy & Precautions, NPO status , Patient's Chart, lab work & pertinent test results, reviewed documented beta blocker date and time   Airway Mallampati: II  TM Distance: >3 FB     Dental  (+) Chipped   Pulmonary asthma , sleep apnea ,           Cardiovascular      Neuro/Psych PSYCHIATRIC DISORDERS Depression    GI/Hepatic   Endo/Other    Renal/GU Renal InsufficiencyRenal disease     Musculoskeletal  (+) Arthritis ,   Abdominal   Peds  Hematology   Anesthesia Other Findings   Reproductive/Obstetrics                             Anesthesia Physical Anesthesia Plan  ASA: III  Anesthesia Plan: MAC   Post-op Pain Management:    Induction:   Airway Management Planned:   Additional Equipment:   Intra-op Plan:   Post-operative Plan:   Informed Consent: I have reviewed the patients History and Physical, chart, labs and discussed the procedure including the risks, benefits and alternatives for the proposed anesthesia with the patient or authorized representative who has indicated his/her understanding and acceptance.     Plan Discussed with: CRNA  Anesthesia Plan Comments:         Anesthesia Quick Evaluation

## 2015-08-02 NOTE — Brief Op Note (Signed)
Pt  Was seen, he was found  To have one stitch side bleeding , under local anaesthesia of 1% lidocaine two more stitches  Were applied, surgi cell applied , cbc and pro time obtained, next pressure dressing  Applied, chest xray  Is neg,

## 2015-08-02 NOTE — Anesthesia Procedure Notes (Signed)
Procedure Name: MAC Performed by: Tonia Ghent Pre-anesthesia Checklist: Patient identified, Emergency Drugs available, Suction available, Patient being monitored and Timeout performed Patient Re-evaluated:Patient Re-evaluated prior to inductionOxygen Delivery Method: Nasal cannula Preoxygenation: Pre-oxygenation with 100% oxygen Intubation Type: IV induction Ventilation: Nasal airway inserted- appropriate to patient size

## 2015-08-03 DIAGNOSIS — R001 Bradycardia, unspecified: Secondary | ICD-10-CM | POA: Diagnosis not present

## 2015-08-03 MED ORDER — CEPHALEXIN 500 MG PO CAPS: 500.0000 mg | ORAL_CAPSULE | Freq: Two times a day (BID) | ORAL | Status: DC

## 2015-08-03 MED ORDER — DIPHENHYDRAMINE HCL 25 MG PO CAPS
25.0000 mg | ORAL_CAPSULE | ORAL | Status: DC | PRN
  Administered 2015-08-03: 25 mg via ORAL
  Filled 2015-08-03: qty 1

## 2015-08-03 MED ORDER — OXYCODONE HCL 5 MG PO TABS: 5.0000 mg | ORAL_TABLET | ORAL | Status: DC | PRN

## 2015-08-03 MED ORDER — CEPHALEXIN 500 MG PO CAPS
500.0000 mg | ORAL_CAPSULE | Freq: Two times a day (BID) | ORAL | Status: DC
  Administered 2015-08-03: 500 mg via ORAL
  Filled 2015-08-03: qty 1

## 2015-08-03 NOTE — Progress Notes (Signed)
Unable to reach Dr Juel Burrow after several pages. No cell phone listed.  Will let Dr Welton Flakes know about pt going home.

## 2015-08-03 NOTE — Discharge Summary (Signed)
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 HIV (human immunodeficiency virus infection) (HCC)   . Bradycardia   . Arthritis   . Depression   . Hyperlipidemia   . Asthma     HOSPITAL COURSE:  74 y.o. male with a known history of recurrent syncope status post extensive cardiac workup, HIV, bronchial asthma, sleep apnea on nocturnal O2, hyperlipidemia, depression, BPH, arthritis was brought in by EMS to the emergency room with the complaint of syncopal episode. According to the patient's daughter-in-law who is with the patient at this time, patient passed out while sitting at home and they did have difficulty to arouse him hence called EMS who found the patient alert awake with stable vitals except for low heart rate around 50s  1. Syncopal episode. History of recurrent syncope status post extensive cardiac workup and per patient's family was negative. History of chronic bradycardia. -s/p PPM by Dr Juel Burrow on oct 5th -HR 60's paced  2. Acute kidney injury with creatinine of 1.24. Likely dehydration. Received IV hydration.  -Appears well-hydrated DC IV fluids   3. Bronchial asthma, stable on home medications. No acute problems.   4. Sleep apnea with history of nocturnal hypoxia-on O2 at night. No acute problems. Continue nocturnal O2 supplementation.  5. HIV, stable on home medications. No acute problems.   6. Depression, stable on home medications.   7. BPH, stable on home medications. No acute problems.   8. DVT prophylaxis: Subcutaneous  Lovenox  CONSULTS OBTAINED:  Treatment Team:  Laurier Nancy, MD Lamar Blinks, MD Corky Downs, MD  DRUG ALLERGIES:  No Known Allergies  DISCHARGE MEDICATIONS:   Current Discharge Medication List    START taking these medications   Details  oxyCODONE (OXY IR/ROXICODONE) 5 MG immediate release tablet Take 1-2 tablets (5-10 mg total) by mouth every 4 (four) hours as needed for moderate pain. Qty: 30 tablet, Refills: 0      CONTINUE these medications which have NOT CHANGED   Details  albuterol (PROVENTIL) (2.5 MG/3ML) 0.083% nebulizer solution Inhale 2.5 mg into the lungs 2 (two) times daily as needed. For shortness of breath and/or wheezing    alendronate (FOSAMAX) 70 MG tablet Take 70 mg by mouth once a week. Take on Saturday. Refills: 5    aspirin EC 81 MG tablet Take 81 mg by mouth daily.    atorvastatin (LIPITOR) 40 MG tablet Take 40 mg by mouth daily.    celecoxib (CELEBREX) 200 MG capsule Take 200 mg by mouth 2 (two) times daily. Refills: 5    clonazePAM (KLONOPIN) 0.5 MG tablet Take 0.5 mg by mouth 2 (two) times daily. Refills: 2    COMPLERA 200-25-300 MG TABS Take 1 tablet by mouth daily. Refills: 11    dutasteride (AVODART) 0.5 MG capsule Take 0.5 mg by mouth daily. Refills: 5    fluticasone (FLONASE) 50 MCG/ACT nasal spray Place 2 sprays into both nostrils daily as needed. For rhinitis. Refills: 5    LYRICA 50 MG capsule Take 50 mg by mouth 2 (  two) times daily. Refills: 2    metaxalone (SKELAXIN) 800 MG tablet Take 800 mg by mouth 2 (two) times daily. Refills: 5    omeprazole (PRILOSEC) 20 MG capsule Take 20 mg by mouth daily. Refills: 5    PARoxetine (PAXIL) 20 MG tablet Take 20 mg by mouth daily.    sulfamethoxazole-trimethoprim (BACTRIM DS,SEPTRA DS) 800-160 MG tablet Take 1 tablet by mouth daily. *routine medication* Refills: 11    tamsulosin (FLOMAX) 0.4 MG CAPS capsule Take 0.4 mg by mouth daily. Refills: 5    tiotropium (SPIRIVA) 18  MCG inhalation capsule Place 18 mcg into inhaler and inhale daily.    tiZANidine (ZANAFLEX) 4 MG tablet Take 4 mg by mouth at bedtime. Refills: 3    traMADol (ULTRAM) 50 MG tablet Take 50 mg by mouth 3 (three) times daily as needed. For pain. Refills: 2    VENTOLIN HFA 108 (90 BASE) MCG/ACT inhaler Inhale 2 puffs into the lungs 4 (four) times daily as needed. For shortness of breath and/or wheezing. Refills: 5        If you experience worsening of your admission symptoms, develop shortness of breath, life threatening emergency, suicidal or homicidal thoughts you must seek medical attention immediately by calling 911 or calling your MD immediately  if symptoms less severe.  You Must read complete instructions/literature along with all the possible adverse reactions/side effects for all the Medicines you take and that have been prescribed to you. Take any new Medicines after you have completely understood and accept all the possible adverse reactions/side effects.   Please note  You were cared for by a hospitalist during your hospital stay. If you have any questions about your discharge medications or the care you received while you were in the hospital after you are discharged, you can call the unit and asked to speak with the hospitalist on call if the hospitalist that took care of you is not available. Once you are discharged, your primary care physician will handle any further medical issues. Please note that NO REFILLS for any discharge medications will be authorized once you are discharged, as it is imperative that you return to your primary care physician (or establish a relationship with a primary care physician if you do not have one) for your aftercare needs so that they can reassess your need for medications and monitor your lab values. Today   SUBJECTIVE   Doing well  VITAL SIGNS:  Blood pressure 92/71, pulse 65, temperature 97.8 F (36.6 C), temperature source Oral, resp. rate  16, height  (1.702 m), weight 70.262 kg (154 lb 14.4 oz), SpO2 100 %.  I/O:   Intake/Output Summary (Last 24 hours) at 08/03/15 0812 Last data filed at 08/03/15 0752  Gross per 24 hour  Intake    100 ml  Output     60 ml  Net     40 ml    PHYSICAL EXAMINATION:  GENERAL:  74 y.o.-year-old patient lying in the bed with no acute distress.  EYES: Pupils equal, round, reactive to light and accommodation. No scleral icterus. Extraocular muscles intact.  HEENT: Head atraumatic, normocephalic. Oropharynx and nasopharynx clear.  NECK:  Supple, no jugular venous distention. No thyroid enlargement, no tenderness.  LUNGS: Normal breath sounds bilaterally, no wheezing, rales,rhonchi or crepitation. No use of accessory muscles of respiration. PPM dressing + CARDIOVASCULAR: S1, S2 normal. No murmurs, rubs, or gallops.  ABDOMEN: Soft, non-tender, non-distended. Bowel sounds present. No organomegaly or mass.  EXTREMITIES: No  pedal edema, cyanosis, or clubbing.  NEUROLOGIC: Cranial nerves II through XII are intact. Muscle strength 5/5 in all extremities. Sensation intact. Gait not checked.  PSYCHIATRIC:  patient is alert and oriented x 3.  SKIN: No obvious rash, lesion, or ulcer.   DATA REVIEW:   CBC   Recent Labs Lab 08/02/15 1954  WBC 9.6  HGB 14.4  HCT 42.8  PLT 185    Chemistries   Recent Labs Lab 07/30/15 1916 07/31/15 0045  NA 137 138  K 3.8 3.9  CL 107 109  CO2 21* 25  GLUCOSE 92 113*  BUN 11 11  CREATININE 1.24 1.12  CALCIUM 8.4* 8.5*  MG 1.9  --   AST 34  --   ALT 20  --   ALKPHOS 43  --   BILITOT 0.4  --     Microbiology Results   No results found for this or any previous visit (from the past 240 hour(s)).  RADIOLOGY:  Dg Chest Port 1 View  08/02/2015   CLINICAL DATA:  Cardiac arrhythmia with pacemaker placement  EXAM: DG C-ARM GT 120 MIN-NO REPORT; PORTABLE CHEST - 1 VIEW  COMPARISON:  July 30, 2015  FINDINGS: There are pacemaker leads attached to the  right atrium and right ventricle. No pneumothorax. There is atelectasis in the left base. The lungs elsewhere are clear. Heart is borderline enlarged with pulmonary vascularity within normal limits. No adenopathy. There is mild elevation of the right hemidiaphragm.  IMPRESSION: Pacemaker leads attached to right atrium right ventricle. No pneumothorax. Atelectasis left base. Mild elevation right hemidiaphragm. Heart prominent but stable.   Electronically Signed   By: Bretta Bang III M.D.   On: 08/02/2015 17:16   Dg C-arm Gt 120 Min-no Report  08/02/2015   CLINICAL DATA: pacemaker insertion   C-ARM GT 120 MINUTE  Fluoroscopy was utilized by the requesting physician.  No radiographic  interpretation.      Management plans discussed with the patient, family and they are in agreement.  CODE STATUS:     Code Status Orders        Start     Ordered   07/31/15 0034  Full code   Continuous     07/31/15 0033    Advance Directive Documentation        Most Recent Value   Type of Advance Directive  Healthcare Power of Attorney, Living will   Pre-existing out of facility DNR order (yellow form or pink MOST form)     "MOST" Form in Place?        TOTAL TIME TAKING CARE OF THIS PATIENT: 40 minutes.    Jovanni Eckhart M.D on 08/03/2015 at 8:12 AM  Between 7am to 6pm - Pager - [phone] After 6pm go to [email or web address] - password [email or web address] [facility]  [facility] [place] [facility]  Office  [phone]  CC: Primary care physician; Evelene Croon, MD

## 2015-08-03 NOTE — Progress Notes (Signed)
Pt complained of itchy eyes. Per patient has been been itching for hours. Family using saline drops for relief. Unsuccessful for extended period of time. Md notified. Acknowledged and new orders placed. Will monitor

## 2015-08-03 NOTE — Anesthesia Postprocedure Evaluation (Signed)
  Anesthesia Post-op Note  Patient: Brian [name]  Procedure(s) Performed: Procedure(s): INSERTION PACEMAKER (N/A)  Anesthesia type:MAC  Patient location: PACU  Post pain: Pain level controlled  Post assessment: Post-op Vital signs reviewed, Patient's Cardiovascular Status Stable, Respiratory Function Stable, Patent Airway and No signs of Nausea or vomiting  Post vital signs: Reviewed and stable  Last Vitals:  Filed Vitals:   08/03/15 0510  BP: 92/71  Pulse: 65  Temp: 36.6 C  Resp: 16    Level of consciousness: awake, alert  and patient cooperative  Complications: No apparent anesthesia complications

## 2015-08-03 NOTE — Progress Notes (Signed)
Patient is to be discharged today. Patient is in no acute distress at this time, and assessment is unchanged from this morning. Patient's IV is out, discharge paperwork has been discussed with patient/family and there are no questions or concerns at this time. Patient will be accompanied downstairs by staff and family via wheelchair.   

## 2015-08-03 NOTE — Care Management (Addendum)
Per PT no home health PT needed. No family in room. Message left for patient's daughter [phone] to call this RNCM if CM services are needed.  Received call back from patient's daughter requesting HHPT, NA, rolling walker and lift chair. She states PT has not worked with patient in two days and that patient will be coming to her address [address] [address]- contact remains the same. His PCP is Dr. Glenis Smoker.Contact to [facility] shared with daughter to ask about lift chair; daughter will call them. Walker to be delivered to this room prior to discharge. She would like to use [facility] for [facility]. Dr. Allena Katz paged for hh orders and walker Rx.

## 2015-08-03 NOTE — Progress Notes (Signed)
   SUBJECTIVE: pt feeling much better, ready to go home.    Filed Vitals:   08/02/15 1713 08/02/15 1746 08/02/15 2017 08/03/15 0510  BP: 128/82 136/79 96/64 92/71   Pulse: 77 75 86 65  Temp:  97.4 F (36.3 C) 98.5 F (36.9 C) 97.8 F (36.6 C)  TempSrc:  Oral Oral Oral  Resp: Height:      Weight:    70.262 kg (154 lb 14.4 oz)  SpO2: 92% 94% 96% 100%    Intake/Output Summary (Last 24 hours) at 08/03/15 0902 Last data filed at 08/03/15 0752  Gross per 24 hour  Intake    100 ml  Output     60 ml  Net     40 ml    LABS: Basic Metabolic Panel: No results for input(s): NA, K, CL, CO2, GLUCOSE, BUN, CREATININE, CALCIUM, MG, PHOS in the last 72 hours. Liver Function Tests: No results for input(s): AST, ALT, ALKPHOS, BILITOT, PROT, ALBUMIN in the last 72 hours. No results for input(s): LIPASE, AMYLASE in the last 72 hours. CBC:  Recent Labs  08/02/15 1954  WBC 9.6  HGB 14.4  HCT 42.8  MCV 84.1  PLT 185   Cardiac Enzymes:  Recent Labs  07/31/15 1327  TROPONINI <0.03   BNP: Invalid input(s): POCBNP D-Dimer: No results for input(s): DDIMER in the last 72 hours. Hemoglobin A1C: No results for input(s): HGBA1C in the last 72 hours. Fasting Lipid Panel: No results for input(s): CHOL, HDL, LDLCALC, TRIG, CHOLHDL, LDLDIRECT in the last 72 hours. Thyroid Function Tests: No results for input(s): TSH, T4TOTAL, T3FREE, THYROIDAB in the last 72 hours.  Invalid input(s): FREET3 Anemia Panel: No results for input(s): VITAMINB12, FOLATE, FERRITIN, TIBC, IRON, RETICCTPCT in the last 72 hours.   PHYSICAL EXAM General: Well developed, well nourished, in no acute distress HEENT:  Normocephalic and atramatic Neck:  No JVD.  Lungs: Clear bilaterally to auscultation and percussion. Heart: HRRR . Normal S1 and S2 without gallops or murmurs.  Abdomen: Bowel sounds are positive, abdomen soft and non-tender  Msk:  Back normal, normal gait. Normal strength and tone for  age. Extremities: No clubbing, cyanosis or edema.   Neuro: Alert and oriented X 3. Psych:  Good affect, responds appropriately  TELEMETRY: Reviewed telemetry pt in paced rhythm 60s.  ASSESSMENT AND PLAN: Successful PPM placement yesterday by Dr. Juel Burrow. Pt ok to go home with f/u in office 10/11 at 2:30pm.    Patient and plan discussed with supervising provider, Dr. Adrian Blackwater, who agrees with above findings.   Brian [name] Brian [name] [facility]  08/03/2015 9:02 AM

## 2015-08-03 NOTE — Discharge Instructions (Signed)
Do not lift anything heavy with your left hand.

## 2015-08-04 ENCOUNTER — Encounter: Payer: Self-pay | Admitting: Cardiology

## 2017-04-02 ENCOUNTER — Other Ambulatory Visit: Payer: Self-pay

## 2017-04-02 ENCOUNTER — Emergency Department
Admission: EM | Admit: 2017-04-02 | Discharge: 2017-04-02 | Disposition: A | Discharge status: Home / Self Care | Payer: Medicare Other | Attending: Emergency Medicine | Admitting: Emergency Medicine

## 2017-04-02 ENCOUNTER — Emergency Department: Payer: Medicare Other

## 2017-04-02 DIAGNOSIS — J45909 Unspecified asthma, uncomplicated: Secondary | ICD-10-CM | POA: Insufficient documentation

## 2017-04-02 DIAGNOSIS — Z7983 Long term (current) use of bisphosphonates: Secondary | ICD-10-CM | POA: Insufficient documentation

## 2017-04-02 DIAGNOSIS — Z7982 Long term (current) use of aspirin: Secondary | ICD-10-CM | POA: Diagnosis not present

## 2017-04-02 DIAGNOSIS — R079 Chest pain, unspecified: Secondary | ICD-10-CM | POA: Diagnosis present

## 2017-04-02 LAB — BASIC METABOLIC PANEL
ANION GAP: 11 (ref 5–15)
BUN: 12 mg/dL (ref 6–20)
CALCIUM: 8.7 mg/dL — AB (ref 8.9–10.3)
CO2: 23 mmol/L (ref 22–32)
Chloride: 106 mmol/L (ref 101–111)
Creatinine, Ser: 0.96 mg/dL (ref 0.61–1.24)
GFR calc Af Amer: >60 mL/min (ref >60)
Glucose, Bld: 136 mg/dL — ABNORMAL HIGH (ref 65–99)
POTASSIUM: 2.8 mmol/L — AB (ref 3.5–5.1)
SODIUM: 140 mmol/L (ref 135–145)

## 2017-04-02 LAB — BRAIN NATRIURETIC PEPTIDE: B NATRIURETIC PEPTIDE 5: 46 pg/mL (ref 0.0–100.0)

## 2017-04-02 LAB — CBC
HCT: 39.6 % — ABNORMAL LOW (ref 40.0–52.0)
HEMOGLOBIN: 13.3 g/dL (ref 13.0–18.0)
MCH: 27.3 pg (ref 26.0–34.0)
MCHC: 33.7 g/dL (ref 32.0–36.0)
MCV: 81 fL (ref 80.0–100.0)
Platelets: 273 10*3/uL (ref 150–440)
RBC: 4.89 MIL/uL (ref 4.40–5.90)
RDW: 16.9 % — AB (ref 11.5–14.5)
WBC: 8.3 10*3/uL (ref 3.8–10.6)

## 2017-04-02 LAB — HEPATIC FUNCTION PANEL
ALT: 29 U/L (ref 17–63)
AST: 55 U/L — AB (ref 15–41)
Albumin: 3.2 g/dL — ABNORMAL LOW (ref 3.5–5.0)
Alkaline Phosphatase: 79 U/L (ref 38–126)
BILIRUBIN DIRECT: 0.3 mg/dL (ref 0.1–0.5)
BILIRUBIN INDIRECT: 0.7 mg/dL (ref 0.3–0.9)
TOTAL PROTEIN: 7.5 g/dL (ref 6.5–8.1)
Total Bilirubin: 1 mg/dL (ref 0.3–1.2)

## 2017-04-02 LAB — TROPONIN I

## 2017-04-02 LAB — LIPASE, BLOOD: Lipase: 18 U/L (ref 11–51)

## 2017-04-02 MED ORDER — ALUM & MAG HYDROXIDE-SIMETH 400-400-40 MG/5ML PO SUSP: 5.0000 mL | Freq: Four times a day (QID) | ORAL | 0 refills | Status: DC | PRN

## 2017-04-02 MED ORDER — POTASSIUM CHLORIDE CRYS ER 20 MEQ PO TBCR
40.0000 meq | EXTENDED_RELEASE_TABLET | Freq: Once | ORAL | Status: AC
  Administered 2017-04-02: 40 meq via ORAL
  Filled 2017-04-02: qty 2

## 2017-04-02 MED ORDER — POTASSIUM CHLORIDE 10 MEQ/100ML IV SOLN
10.0000 meq | Freq: Once | INTRAVENOUS | Status: AC
  Administered 2017-04-02: 10 meq via INTRAVENOUS
  Filled 2017-04-02: qty 100

## 2017-04-02 MED ORDER — FAMOTIDINE 20 MG PO TABS: 20.0000 mg | ORAL_TABLET | Freq: Two times a day (BID) | ORAL | 1 refills | Status: DC

## 2017-04-02 MED ORDER — IOPAMIDOL (ISOVUE-370) INJECTION 76%
75.0000 mL | Freq: Once | INTRAVENOUS | Status: AC | PRN
  Administered 2017-04-02: 75 mL via INTRAVENOUS

## 2017-04-02 NOTE — ED Notes (Signed)
Family at bedside. 

## 2017-04-02 NOTE — ED Provider Notes (Addendum)
[facility] Emergency Department Provider Note  ____________________________________________   I have reviewed the triage vital signs and the nursing notes.   HISTORY  Chief Complaint Chest Pain    HPI Brian [name] is a 76 y.o. male presents today complaining of chest pain. Right side. Nonradiating. His been there for 2 weeks. Worse when he lies down. Better when he is up and about during the day. Has been having it all day today without interruption. Denies any fever or chills. Denies vomiting. Patient burst into tears during the interview. He states he is worried. No history of CAD. Does have a history of HIV, depression, anxiety, arthritis. He does have a pacemaker..  Pain is poorly described by the patient, nothing makes it better aside from being up and around, nothing makes it worse aside from lying down in bed at night., denies shortness of breath, pain is not pleuritic. Has been there he states as is noted above for 2 weeks   Past Medical History:  Diagnosis Date  . Arthritis   . Asthma   . Bradycardia   . Depression   . HIV (human immunodeficiency virus infection) (HCC)   . Hyperlipidemia     Patient Active Problem List   Diagnosis Date Noted  . Syncope 07/30/2015  . AKI (acute kidney injury) (HCC) 07/30/2015  . 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  . Sleep apnea 07/30/2015  . HIV (human immunodeficiency virus infection) (HCC) 07/30/2015  . Depression 07/30/2015  . BPH (benign prostatic hyperplasia) 07/30/2015    Past Surgical History:  Procedure Laterality Date  . cataracts    . EYE SURGERY    . PACEMAKER INSERTION N/A 08/02/2015   Procedure: INSERTION PACEMAKER;  Surgeon: Corky DownsJaved Masoud, MD;  Location: [name];  Service: Cardiovascular;  Laterality: N/A;    Prior to Admission medications   Medication Sig Start Date End Date Taking? Authorizing Provider  albuterol (PROVENTIL) (2.5 MG/3ML) 0.083% nebulizer solution Inhale 2.5 mg into the lungs  2 (two) times daily as needed. For shortness of breath and/or wheezing    [provider]  alendronate (FOSAMAX) 70 MG tablet Take 70 mg by mouth once a week. Take on Saturday. 07/21/15   [provider]  aspirin EC 81 MG tablet Take 81 mg by mouth daily.    [provider]  atorvastatin (LIPITOR) 40 MG tablet Take 40 mg by mouth daily.    [provider]  celecoxib (CELEBREX) 200 MG capsule Take 200 mg by mouth 2 (two) times daily. 07/21/15   [provider]  cephALEXin (KEFLEX) 500 MG capsule Take 1 capsule (500 mg total) by mouth every 12 (twelve) hours. 08/03/15   Enedina FinnerPatel, Sona, MD  clonazePAM (KLONOPIN) 0.5 MG tablet Take 0.5 mg by mouth 2 (two) times daily. 07/21/15   [provider]  COMPLERA 200-25-300 MG TABS Take 1 tablet by mouth daily. 07/21/15   [provider]  dutasteride (AVODART) 0.5 MG capsule Take 0.5 mg by mouth daily. 07/21/15   [provider]  fluticasone (FLONASE) 50 MCG/ACT nasal spray Place 2 sprays into both nostrils daily as needed. For rhinitis. 07/21/15   [provider]  LYRICA 50 MG capsule Take 50 mg by mouth 2 (two) times daily. 07/21/15   [provider]  metaxalone (SKELAXIN) 800 MG tablet Take 800 mg by mouth 2 (two) times daily. 07/16/15   [provider]  omeprazole (PRILOSEC) 20 MG capsule Take 20 mg by mouth daily. 07/21/15   [provider]  oxyCODONE (OXY IR/ROXICODONE) 5 MG immediate release tablet Take 1-2 tablets (5-10 mg total) by mouth every 4 (four) hours as needed for moderate pain. 08/03/15   Enedina Finner, MD  PARoxetine (PAXIL) 20 MG tablet Take 20 mg by mouth daily.    [provider]  sulfamethoxazole-trimethoprim (BACTRIM DS,SEPTRA DS) 800-160 MG tablet Take 1 tablet by mouth daily. *routine medication* 07/21/15   [provider]  tamsulosin (FLOMAX) 0.4 MG CAPS capsule Take 0.4 mg by mouth daily. 07/21/15   [provider]   tiotropium (SPIRIVA) 18 MCG inhalation capsule Place 18 mcg into inhaler and inhale daily.    [provider]  tiZANidine (ZANAFLEX) 4 MG tablet Take 4 mg by mouth at bedtime. 05/22/15   [provider]  traMADol (ULTRAM) 50 MG tablet Take 50 mg by mouth 3 (three) times daily as needed. For pain. 07/20/15   [provider]  VENTOLIN HFA 108 (90 BASE) MCG/ACT inhaler Inhale 2 puffs into the lungs 4 (four) times daily as needed. For shortness of breath and/or wheezing. 07/21/15   [provider]    Allergies Patient has no known allergies.  Family History  Problem Relation Age of Onset  . Hypertension Other     Social History Social History  Substance Use Topics  . Smoking status: Never Smoker  . Smokeless tobacco: Never Used  . Alcohol use 1.2 oz/week    2 Cans of beer per week     Comment: 1-2 drinks a day    Review of Systems Constitutional: No fever/chills Eyes: No visual changes. ENT: No sore throat. No stiff neck no neck pain Cardiovascular: Positive chest pain. Respiratory: Denies shortness of breath. Gastrointestinal:   no vomiting.  No diarrhea.  No constipation. Genitourinary: Negative for dysuria. Musculoskeletal: Negative lower extremity swelling Skin: Negative for rash. Neurological: Negative for severe headaches, focal weakness or numbness.   ____________________________________________   PHYSICAL EXAM:  VITAL SIGNS: ED Triage Vitals  Enc Vitals Group     BP 04/02/17 1239 (!) 155/98     Pulse Rate 04/02/17 1239 78     Resp 04/02/17 1239 15     Temp 04/02/17 1239 98 F (36.7 C)     Temp Source 04/02/17 1239 Oral     SpO2 04/02/17 1239 100 %     Weight 04/02/17 1243 163 lb (73.9 kg)     Height 04/02/17 1243 5\' 6"  (1.676 m)     Head Circumference --      Peak Flow --      Pain Score 04/02/17 1238 8     Pain Loc --      Pain Edu? --      Excl. in GC? --     Constitutional: Alert and oriented. Well appearing and  in no acute distress.Medically however patient is crying and very upset emotionally Eyes: Conjunctivae are normal Head: Atraumatic HEENT: No congestion/rhinnorhea. Mucous membranes are moist.  Oropharynx non-erythematous Neck:   Nontender with no meningismus, no masses, no stridor Cardiovascular: Normal rate, regular rhythm. Grossly normal heart sounds.  Good peripheral circulation. Respiratory: Normal respiratory effort.  No retractions. Lungs CTAB. Abdominal: Soft and nontender. No distention. No guarding no rebound Back:  There is no focal tenderness or step off.  there is no midline tenderness there are no lesions noted. there is no CVA tenderness Musculoskeletal: No lower extremity tenderness, no upper extremity tenderness. No joint effusions, no DVT signs strong distal pulses no edema Neurologic:  Normal speech and  language. No gross focal neurologic deficits are appreciated.  Skin:  Skin is warm, dry and intact. No rash noted. Psychiatric: Mood and affect are anxious and upset. Speech and behavior are normal.  ____________________________________________   LABS (all labs ordered are listed, but only abnormal results are displayed)  Labs Reviewed  BASIC METABOLIC PANEL - Abnormal; Notable for the following:       Result Value   Potassium 2.8 (*)    Glucose, Bld 136 (*)    Calcium 8.7 (*)    All other components within normal limits  CBC - Abnormal; Notable for the following:    HCT 39.6 (*)    RDW 16.9 (*)    All other components within normal limits  TROPONIN I  BRAIN NATRIURETIC PEPTIDE   ____________________________________________  EKG  I personally interpreted any EKGs ordered by me or triage Sinus rhythm, partial R BB and LAFB noted, no acute ST elevation or depression, LAD noted, no acute ischemic changes. No changes from prior ____________________________________________  RADIOLOGY  I reviewed any imaging ordered by me or triage that were performed during my  shift and, if possible, patient and/or family made aware of any abnormal findings. ____________________________________________   PROCEDURES  Procedure(s) performed: None  Procedures  Critical Care performed: None  ____________________________________________   INITIAL IMPRESSION / ASSESSMENT AND PLAN / ED COURSE  Pertinent labs & imaging results that were available during my care of the patient were reviewed by me and considered in my medical decision making (see chart for details).  Patient here with very atypical chest pain which has been going on for 2 weeks. He is very tearful emotional and upset about it. EKG and initial troponin are negative despite pain all day and for 2 weeks prior to that. Somewhat reproducible chest discomfort. Chest x-ray is pending we will reassess. Potassium is low and we will replete it.  ----------------------------------------- 2:29 PM on 04/02/2017 -----------------------------------------  Administration at this time in no acute distress he is much calmer blood pressure reflects this. His chest pain is gone. He states he does have muscle pull sometimes in his upper back which are spasms which cause him to have pain and is worse when he lies down. This is a slightly different history than initially offered. He does have some minimal reproducible tenderness in the paraspinal muscles of the upper back. Given his history however I will obtain a CT scan to rule out PE as patient currently has had if the chest discomfort 2 weeks. I don't think this her presents ACS, as the patient's troponin is negative despite prolonged pain but we will send a second cardiac enzyme. If that is negative, patient, as he appears now, likely can follow-up.  ----------------------------------------- 3:12 PM on 04/02/2017 -----------------------------------------  Pt in nad signed out to Dr. Don Perking at the end of my shift.      ____________________________________________   FINAL CLINICAL IMPRESSION(S) / ED DIAGNOSES  Final diagnoses:  None      This chart was dictated using voice recognition software.  Despite best efforts to proofread,  errors can occur which can change meaning.      Jeanmarie Plant, MD 04/02/17 1341    Jeanmarie Plant, MD 04/02/17 1430    Jeanmarie Plant, MD 04/02/17 [phone]

## 2017-04-02 NOTE — ED Triage Notes (Addendum)
Pt bib EMS from home w/ c/o CP x 2 weeks, worse w/ lying down. Pt anxious in triage.  Pt spanish speaking. Pt received 324 ASA and 1 nitro via ems.  Pt able to speak in complete sentences, MAE, A/OX4

## 2017-04-02 NOTE — ED Provider Notes (Signed)
-----------------------------------------   5:18 PM on 04/02/2017 -----------------------------------------   Blood pressure (!) 157/84, pulse 65, temperature 98 F (36.7 C), temperature source Oral, resp. rate 16, height 5\' 6"  (1.676 m), weight 73.9 kg (163 lb), SpO2 100 %.  Assuming care from Dr. Alphonzo LemmingsMcShane of [facility] is a 76 y.o. male with a chief complaint of Chest Pain .    In summary, [age]M with h/o HIV who presents for evaluation of 2 weeks of CP worse when lying flat. After further discussion with the patient he tells me that the chest pain feels like a burning sensation in the center of his chest that is present at nighttime when he lays down it sounds like reflux. He underwent CTA of his chest which is negative and troponin 2 also negative. He remains chest pain-free. His EKG is nonischemic. I will start patient on Pepcid twice a day and Maalox when necessary and will also refer patient to cardiology for further evaluation based on his comorbidities and age.    Don PerkingVeronese, [place], MD 04/02/17 [phone]

## 2017-04-02 NOTE — ED Notes (Signed)
Pt. returned from XR. 

## 2017-04-02 NOTE — ED Notes (Signed)
Patient transported to CT 

## 2017-04-02 NOTE — ED Notes (Signed)
ED Provider at bedside. 

## 2017-04-02 NOTE — ED Notes (Signed)
Patient transported to X-ray 

## 2018-08-13 ENCOUNTER — Ambulatory Visit: Payer: Medicare Other | Admitting: Infectious Diseases

## 2018-09-03 ENCOUNTER — Other Ambulatory Visit
Admission: RE | Admit: 2018-09-03 | Discharge: 2018-09-03 | Disposition: A | Discharge status: Home / Self Care | Payer: Medicare Other | Source: Ambulatory Visit | Attending: Infectious Diseases | Admitting: Infectious Diseases

## 2018-09-03 ENCOUNTER — Ambulatory Visit: Payer: Medicare Other | Attending: Infectious Diseases | Admitting: Infectious Diseases

## 2018-09-03 ENCOUNTER — Encounter: Payer: Self-pay | Admitting: Infectious Diseases

## 2018-09-03 VITALS — BP 161/101 | HR 80 | Temp 97.4°F | Wt 168.0 lb

## 2018-09-03 DIAGNOSIS — Z21 Asymptomatic human immunodeficiency virus [HIV] infection status: Secondary | ICD-10-CM | POA: Diagnosis not present

## 2018-09-03 DIAGNOSIS — B20 Human immunodeficiency virus [HIV] disease: Secondary | ICD-10-CM | POA: Diagnosis present

## 2018-09-03 DIAGNOSIS — Z79899 Other long term (current) drug therapy: Secondary | ICD-10-CM

## 2018-09-03 DIAGNOSIS — Z950 Presence of cardiac pacemaker: Secondary | ICD-10-CM

## 2018-09-03 DIAGNOSIS — E785 Hyperlipidemia, unspecified: Secondary | ICD-10-CM | POA: Diagnosis not present

## 2018-09-03 LAB — COMPREHENSIVE METABOLIC PANEL
ALBUMIN: 4.3 g/dL (ref 3.5–5.0)
ALT: 24 U/L (ref 0–44)
ANION GAP: 8 (ref 5–15)
AST: 29 U/L (ref 15–41)
Alkaline Phosphatase: 43 U/L (ref 38–126)
BILIRUBIN TOTAL: 0.9 mg/dL (ref 0.3–1.2)
BUN: 15 mg/dL (ref 8–23)
CHLORIDE: 103 mmol/L (ref 98–111)
CO2: 27 mmol/L (ref 22–32)
Calcium: 9 mg/dL (ref 8.9–10.3)
Creatinine, Ser: 1.13 mg/dL (ref 0.61–1.24)
GFR calc Af Amer: >60 mL/min (ref >60)
GFR calc non Af Amer: >60 mL/min (ref >60)
GLUCOSE: 117 mg/dL — AB (ref 70–99)
POTASSIUM: 4.2 mmol/L (ref 3.5–5.1)
SODIUM: 138 mmol/L (ref 135–145)
Total Protein: 7.9 g/dL (ref 6.5–8.1)

## 2018-09-03 MED ORDER — ABACAVIR-DOLUTEGRAVIR-LAMIVUD 600-50-300 MG PO TABS: 1.0000 | ORAL_TABLET | Freq: Every day | ORAL | 3 refills | Status: DC

## 2018-09-03 NOTE — Patient Instructions (Signed)
You are here to se eme to engage in IV care. You are on Triumeq and doing very well- today we will do labs. Will see you again in 3 months. I have sent refills for you triumeq to [facility]

## 2018-09-03 NOTE — Progress Notes (Signed)
NAME: Brian [name]  DOB: [birth year]  MRN: [id number]  Date/Time: 09/03/2018 9:42 AM Subjective:  REASON FOR CONSULT: HIV care Saw pat with spanish interpreter ? Brian [name] is a 77 y.o. 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 Pt is not the best of historian. He says he was diagnosed in 2010 when he found out that his wife in [place] had died of AIDS. He is originally from there but has been in [place] for the past 31 years HIV diagnosed ?2010 Nadir -none HAARt history 1st regimen was complera 2nd regimen triumeq  Acquired thru- heterosexual contact Genotype NA  He is doing very well- no side effects from meds Last Cd4 246 ( 20.5%) Vl 60 from 12/11/17  ? Past Medical History:  Diagnosis Date  . Arthritis   . Asthma   . Bradycardia   . Depression   . HIV (human immunodeficiency virus infection) (HCC)   . Hyperlipidemia     Past Surgical History:  Procedure Laterality Date  . cataracts    . EYE SURGERY    . PACEMAKER INSERTION N/A 08/02/2015   Procedure: INSERTION PACEMAKER;  Surgeon: Corky Downs, MD;  Location: [name];  Service: Cardiovascular;  Laterality: N/A;    SH Lives on his own 6 children and many grandchildren Non smoker No alcohol or illicit drug use   Family History  Problem Relation Age of Onset  . Hypertension Other    No Known Allergies ? Current Outpatient Medications  Medication Sig Dispense Refill  . albuterol (PROVENTIL) (2.5 MG/3ML) 0.083% nebulizer solution Inhale 2.5 mg into the lungs 2 (two) times daily as needed. For shortness of breath and/or wheezing    . alendronate (FOSAMAX) 70 MG tablet Take 70 mg by mouth once a week. Take on Saturday.  5  . alum & mag hydroxide-simeth (MAALOX MAX) 400-400-40 MG/5ML suspension Take 5 mLs by mouth every 6 (six) hours as needed for indigestion. 355 mL 0  . aspirin EC 81 MG tablet  Take 81 mg by mouth daily.    Marland Kitchen atorvastatin (LIPITOR) 40 MG tablet Take 40 mg by mouth daily.    . celecoxib (CELEBREX) 200 MG capsule Take 200 mg by mouth 2 (two) times daily.  5  . cephALEXin (KEFLEX) 500 MG capsule Take 1 capsule (500 mg total) by mouth every 12 (twelve) hours. 12 capsule 0  . clonazePAM (KLONOPIN) 0.5 MG tablet Take 0.5 mg by mouth 2 (two) times daily.  2  . dutasteride (AVODART) 0.5 MG capsule Take 0.5 mg by mouth daily.  5  . famotidine (PEPCID) 20 MG tablet Take 1 tablet (20 mg total) by mouth 2 (two) times daily. 60 tablet 1  . fluticasone (FLONASE) 50 MCG/ACT nasal spray Place 2 sprays into both nostrils daily as needed. For rhinitis.  5  . LYRICA 50 MG capsule Take 50 mg by mouth 2 (two) times daily.  2  . metaxalone (SKELAXIN) 800 MG tablet Take 800 mg by mouth 2 (two) times daily.  5  . omeprazole (PRILOSEC) 20 MG capsule Take 20 mg by mouth daily.  5  . oxyCODONE (OXY IR/ROXICODONE) 5 MG immediate release tablet Take 1-2 tablets (5-10 mg total) by mouth every 4 (four) hours as needed for moderate pain. 30 tablet 0  . PARoxetine (PAXIL) 20 MG tablet Take 20 mg by mouth daily.    Marland Kitchen sulfamethoxazole-trimethoprim (BACTRIM DS,SEPTRA  DS) 800-160 MG tablet Take 1 tablet by mouth daily. *routine medication*  11  . tamsulosin (FLOMAX) 0.4 MG CAPS capsule Take 0.4 mg by mouth daily.  5  . tiotropium (SPIRIVA) 18 MCG inhalation capsule Place 18 mcg into inhaler and inhale daily.    Marland Kitchen tiZANidine (ZANAFLEX) 4 MG tablet Take 4 mg by mouth at bedtime.  3  . traMADol (ULTRAM) 50 MG tablet Take 50 mg by mouth 3 (three) times daily as needed. For pain.  2  . TRIUMEQ 600-50-300 MG tablet Take 1 tablet by mouth daily.  0  . VENTOLIN HFA 108 (90 BASE) MCG/ACT inhaler Inhale 2 puffs into the lungs 4 (four) times daily as needed. For shortness of breath and/or wheezing.  5   No current facility-administered medications for this visit.     REVIEW OF SYSTEMS:  Const: negative fever,  negative chills, negative weight loss Eyes: negative diplopia or visual changes, negative eye pain ENT: negative coryza, negative sore throat Resp: negative cough, hemoptysis, dyspnea Cards: negative for chest pain, palpitations, lower extremity edema GU: negative for frequency, dysuria and hematuria Skin: negative for rash and pruritus Heme: negative for easy bruising and gum/nose bleeding MS: negative for myalgias, arthralgias, back pain and muscle weakness Neurolo:negative for headaches, dizziness, vertigo, memory problems  Psych: negative for feelings of anxiety, depression   Objective:  VITALS:  BP (!) 161/101 (BP Location: Left Arm, Patient Position: Sitting, Cuff Size: Normal)   Pulse 80   Temp (!) 97.4 F (36.3 C) (Oral)   Wt 168 lb (76.2 kg)   BMI 27.12 kg/m  PHYSICAL EXAM:  General: Alert, cooperative, no distress, appears stated age.  Head: Normocephalic, without obvious abnormality, atraumatic. Eyes: Conjunctivae clear, anicteric sclerae. Pupils are equal Nose: Nares normal. No drainage or sinus tenderness. Throat: Lips, mucosa, and tongue normal. No Thrush Neck: Supple, symmetrical, no adenopathy, thyroid: non tender no carotid bruit and no JVD. Back: No CVA tenderness. Lungs: Clear to auscultation bilaterally. No Wheezing or Rhonchi. No rales. Heart: Regular rate and rhythm, no murmur, rub or gallop.pacemaker site fine Abdomen: Soft, non-tender,not distended. Bowel sounds normal. No masses Extremities: Extremities normal, atraumatic, no cyanosis. No edema. No clubbing Skin: No rashes or lesions. Not Jaundiced Lymph: Cervical, supraclavicular normal. 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 Last cd4 is 42 from Feb 2019 Will check labs today   Health maintenance /vaccination status needs to be updated. Will get [facility] records before 2015  Labs done today  Hyperlipidemia on atorvastatin  Takes some pain meds intermittently ( tramadol, zanaflex, lyrica) ? ___________________________________________________ Discussed with patient Follow up in 3 months

## 2018-09-04 LAB — HELPER T-LYMPH-CD4 (ARMC ONLY)
% CD 4 Pos. Lymph.: 25 % — ABNORMAL LOW (ref 30.8–58.5)
Absolute CD 4 Helper: 300 /uL — ABNORMAL LOW (ref 359–1519)
BASOS ABS: 0 10*3/uL (ref 0.0–0.2)
BASOS: 1 %
EOS (ABSOLUTE): 0.1 10*3/uL (ref 0.0–0.4)
EOS: 2 %
Hematocrit: 44.2 % (ref 37.5–51.0)
Hemoglobin: 14.9 g/dL (ref 13.0–17.7)
Immature Grans (Abs): 0 10*3/uL (ref 0.0–0.1)
Immature Granulocytes: 0 %
LYMPHS ABS: 1.2 10*3/uL (ref 0.7–3.1)
Lymphs: 21 %
MCH: 29.2 pg (ref 26.6–33.0)
MCHC: 33.7 g/dL (ref 31.5–35.7)
MCV: 87 fL (ref 79–97)
MONOS ABS: 0.6 10*3/uL (ref 0.1–0.9)
Monocytes: 10 %
Neutrophils Absolute: 3.8 10*3/uL (ref 1.4–7.0)
Neutrophils: 66 %
PLATELETS: 249 10*3/uL (ref 150–450)
RBC: 5.11 x10E6/uL (ref 4.14–5.80)
RDW: 15 % (ref 12.3–15.4)
WBC: 5.7 10*3/uL (ref 3.4–10.8)

## 2018-09-04 LAB — HEPATITIS C ANTIBODY: HCV Ab: <0.1 s/co ratio (ref 0.0–0.9)

## 2018-09-04 LAB — HIV-1 RNA QUANT-NO REFLEX-BLD
HIV 1 RNA Quant: <20 copies/mL
LOG10 HIV-1 RNA: UNDETERMINED {Log_copies}/mL

## 2018-09-06 LAB — QUANTIFERON-TB GOLD PLUS (RQFGPL)
QUANTIFERON TB2 AG VALUE: 0.03 [IU]/mL
QuantiFERON Mitogen Value: >10 IU/mL
QuantiFERON Nil Value: 0.02 IU/mL
QuantiFERON TB1 Ag Value: 0.02 IU/mL

## 2018-09-06 LAB — QUANTIFERON-TB GOLD PLUS: QUANTIFERON-TB GOLD PLUS: NEGATIVE

## 2018-09-28 ENCOUNTER — Other Ambulatory Visit: Payer: Self-pay | Admitting: Licensed Clinical Social Worker

## 2018-09-28 MED ORDER — ABACAVIR-DOLUTEGRAVIR-LAMIVUD 600-50-300 MG PO TABS: 1.0000 | ORAL_TABLET | Freq: Every day | ORAL | 3 refills | Status: DC

## 2018-11-27 ENCOUNTER — Encounter: Payer: Self-pay | Admitting: Licensed Clinical Social Worker

## 2018-11-27 DIAGNOSIS — J449 Chronic obstructive pulmonary disease, unspecified: Secondary | ICD-10-CM | POA: Insufficient documentation

## 2018-12-01 ENCOUNTER — Ambulatory Visit: Payer: Medicare Other | Attending: Infectious Diseases | Admitting: Infectious Diseases

## 2018-12-01 ENCOUNTER — Encounter: Payer: Self-pay | Admitting: Infectious Diseases

## 2018-12-01 VITALS — BP 137/91 | HR 104 | Temp 97.7°F | Ht 66.0 in | Wt 193.0 lb

## 2018-12-01 DIAGNOSIS — Z0000 Encounter for general adult medical examination without abnormal findings: Secondary | ICD-10-CM

## 2018-12-01 DIAGNOSIS — Z23 Encounter for immunization: Secondary | ICD-10-CM | POA: Diagnosis not present

## 2018-12-01 DIAGNOSIS — E785 Hyperlipidemia, unspecified: Secondary | ICD-10-CM

## 2018-12-01 DIAGNOSIS — Z79899 Other long term (current) drug therapy: Secondary | ICD-10-CM | POA: Diagnosis not present

## 2018-12-01 DIAGNOSIS — Z950 Presence of cardiac pacemaker: Secondary | ICD-10-CM

## 2018-12-01 DIAGNOSIS — B20 Human immunodeficiency virus [HIV] disease: Secondary | ICD-10-CM

## 2018-12-01 DIAGNOSIS — J4520 Mild intermittent asthma, uncomplicated: Secondary | ICD-10-CM

## 2018-12-01 DIAGNOSIS — Z9189 Other specified personal risk factors, not elsewhere classified: Secondary | ICD-10-CM

## 2018-12-01 DIAGNOSIS — J449 Chronic obstructive pulmonary disease, unspecified: Secondary | ICD-10-CM

## 2018-12-01 MED ORDER — PNEUMOCOCCAL 13-VAL CONJ VACC IM SUSP: 0.5000 mL | Freq: Once | INTRAMUSCULAR | 0 refills | Status: DC

## 2018-12-01 MED ORDER — PNEUMOCOCCAL 13-VAL CONJ VACC IM SUSP: 0.5000 mL | Freq: Once | INTRAMUSCULAR | 0 refills | Status: AC

## 2018-12-01 NOTE — Progress Notes (Signed)
NAME: Brian [name]  DOB: [birth year]  MRN: [id number]  Date/Time: 12/01/2018 12:45 PM Subjective:  Patient here for follow-up of HIV  Saw pat with spanish interpreter ? Brian [name] is a 78 y.o. male with a history of  HIV/AIDS, pacemaker for symptomatic bradycardia is here for follow-up.  He is currently on Triumeq and is 100% adherent to the medication.  No side effects from the medication.  Last viral load less than 20 and CD4 300 from November 2019.  He has no specific complaints today.   His previous provider Dr.Fitzgerald is in [place].  diagnosed in 2014 when he found out that his wife in [place] had died of AIDS. He is originally from there but has been in [place] for the past 31 years HIV diagnosed 2014 1st visit to [facility] 11/25/13 Nadir cd4 was 120 (5%) on 10/29/2013 VL 273,240 from 10/29/2013  HAARt history 1st regimen was complera 2nd regimen triumeq  Acquired thru- heterosexual contact Genotype highly sensitive organism  ? Past Medical History:  Diagnosis Date  . Arthritis   . Asthma   . Bradycardia   . Depression   . HIV (human immunodeficiency virus infection) (HCC)   . Hyperlipidemia   Osteoporosis Past Surgical History:  Procedure Laterality Date  . cataracts    . EYE SURGERY    . PACEMAKER INSERTION N/A 08/02/2015   Procedure: INSERTION PACEMAKER;  Surgeon: Corky Downs, MD;  Location: [name];  Service: Cardiovascular;  Laterality: N/A;  prosthesis for urinary flo   SH Lives on his own 6 children and many grandchildren Non smoker No alcohol or illicit drug use   Family History  Problem Relation Age of Onset  . Hypertension Other    No Known Allergies    ?Current outpatient medications reviewed REVIEW OF SYSTEMS:  Const: negative fever, negative chills, negative weight loss Eyes: negative diplopia or visual changes, negative eye pain ENT: negative coryza, negative sore throat Resp: negative cough, hemoptysis, dyspnea Cards: negative for chest  pain, palpitations, lower extremity edema GU: negative for frequency, dysuria and hematuria Skin: negative for rash and pruritus Heme: negative for easy bruising and gum/nose bleeding MS: negative for myalgias, arthralgias, back pain and muscle weakness Neurolo:negative for headaches, dizziness, vertigo, memory problems  Psych: negative for feelings of anxiety, depression   Objective:  VITALS:  BP (!) 137/91 (BP Location: Left Arm, Patient Position: Sitting, Cuff Size: Normal)   Pulse (!) 104   Temp 97.7 F (36.5 C) (Oral)   Ht 5\' 6"  (1.676 m)   Wt 193 lb (87.5 kg)   BMI 31.15 kg/m  PHYSICAL EXAM:  General: Alert, cooperative, no distress, appears stated age.  Head: Normocephalic, without obvious abnormality, atraumatic. Eyes: Conjunctivae clear, anicteric sclerae. Pupils are equal Nose: Nares normal. No drainage or sinus tenderness. Throat: Lips, mucosa, and tongue normal. No Thrush Neck: Supple, symmetrical, no adenopathy, thyroid: non tender no carotid bruit and no JVD. Back: No CVA tenderness. Lungs: Clear to auscultation bilaterally. No Wheezing or Rhonchi. No rales. Heart: Regular rate and rhythm, no murmur, rub or gallop.pacemaker site fine Abdomen: Soft, non-tender,not distended. Bowel sounds normal. No masses Extremities: Extremities normal, atraumatic, no cyanosis. No edema. No clubbing Skin: No rashes or lesions. Not Jaundiced Lymph: Cervical, supraclavicular normal. 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  We will give Prevnar 13 and Tdap today    Hyperlipidemia on atorvastatin  Takes some pain meds intermittently ( tramadol, zanaflex, lyrica) ? ___________________________________________________ Discussed with patient Follow up in 6 months

## 2018-12-01 NOTE — Patient Instructions (Signed)
You need to get  prevnar vaccine TdaP

## 2018-12-02 NOTE — Addendum Note (Signed)
Addended by: Starleen Arms D on: 12/02/2018 11:09 AM   Modules accepted: Orders

## 2019-01-26 ENCOUNTER — Other Ambulatory Visit: Payer: Self-pay | Admitting: Licensed Clinical Social Worker

## 2019-01-26 DIAGNOSIS — B20 Human immunodeficiency virus [HIV] disease: Secondary | ICD-10-CM

## 2019-01-26 MED ORDER — ABACAVIR-DOLUTEGRAVIR-LAMIVUD 600-50-300 MG PO TABS: 1.0000 | ORAL_TABLET | Freq: Every day | ORAL | 3 refills | Status: DC

## 2019-05-26 ENCOUNTER — Other Ambulatory Visit: Payer: Medicare Other

## 2019-05-26 ENCOUNTER — Other Ambulatory Visit: Payer: Self-pay | Admitting: Licensed Clinical Social Worker

## 2019-05-26 DIAGNOSIS — B20 Human immunodeficiency virus [HIV] disease: Secondary | ICD-10-CM

## 2019-06-01 ENCOUNTER — Other Ambulatory Visit: Payer: Self-pay

## 2019-06-01 ENCOUNTER — Other Ambulatory Visit
Admission: RE | Admit: 2019-06-01 | Discharge: 2019-06-01 | Disposition: A | Discharge status: Home / Self Care | Payer: Medicare Other | Source: Ambulatory Visit | Attending: Infectious Diseases | Admitting: Infectious Diseases

## 2019-06-01 ENCOUNTER — Ambulatory Visit: Payer: Medicare Other | Attending: Infectious Diseases | Admitting: Infectious Diseases

## 2019-06-01 ENCOUNTER — Encounter: Payer: Self-pay | Admitting: Infectious Diseases

## 2019-06-01 VITALS — BP 173/103 | HR 89 | Temp 97.9°F

## 2019-06-01 DIAGNOSIS — B20 Human immunodeficiency virus [HIV] disease: Secondary | ICD-10-CM | POA: Insufficient documentation

## 2019-06-01 DIAGNOSIS — E785 Hyperlipidemia, unspecified: Secondary | ICD-10-CM | POA: Diagnosis not present

## 2019-06-01 DIAGNOSIS — Z950 Presence of cardiac pacemaker: Secondary | ICD-10-CM

## 2019-06-01 DIAGNOSIS — Z79899 Other long term (current) drug therapy: Secondary | ICD-10-CM | POA: Diagnosis not present

## 2019-06-01 LAB — COMPREHENSIVE METABOLIC PANEL
ALT: 33 U/L (ref 0–44)
AST: 39 U/L (ref 15–41)
Albumin: 4.1 g/dL (ref 3.5–5.0)
Alkaline Phosphatase: 45 U/L (ref 38–126)
Anion gap: 8 (ref 5–15)
BUN: 11 mg/dL (ref 8–23)
CO2: 26 mmol/L (ref 22–32)
Calcium: 9 mg/dL (ref 8.9–10.3)
Chloride: 104 mmol/L (ref 98–111)
Creatinine, Ser: 0.99 mg/dL (ref 0.61–1.24)
GFR calc Af Amer: >60 mL/min (ref >60)
GFR calc non Af Amer: >60 mL/min (ref >60)
Glucose, Bld: 129 mg/dL — ABNORMAL HIGH (ref 70–99)
Potassium: 4.2 mmol/L (ref 3.5–5.1)
Sodium: 138 mmol/L (ref 135–145)
Total Bilirubin: 0.7 mg/dL (ref 0.3–1.2)
Total Protein: 7.6 g/dL (ref 6.5–8.1)

## 2019-06-01 LAB — CBC WITH DIFFERENTIAL/PLATELET
Abs Immature Granulocytes: 0.02 10*3/uL (ref 0.00–0.07)
Basophils Absolute: 0 10*3/uL (ref 0.0–0.1)
Basophils Relative: 1 %
Eosinophils Absolute: 0.1 10*3/uL (ref 0.0–0.5)
Eosinophils Relative: 2 %
HCT: 43.5 % (ref 39.0–52.0)
Hemoglobin: 14.5 g/dL (ref 13.0–17.0)
Immature Granulocytes: 0 %
Lymphocytes Relative: 26 %
Lymphs Abs: 1.6 10*3/uL (ref 0.7–4.0)
MCH: 29 pg (ref 26.0–34.0)
MCHC: 33.3 g/dL (ref 30.0–36.0)
MCV: 87 fL (ref 80.0–100.0)
Monocytes Absolute: 0.6 10*3/uL (ref 0.1–1.0)
Monocytes Relative: 9 %
Neutro Abs: 3.9 10*3/uL (ref 1.7–7.7)
Neutrophils Relative %: 62 %
Platelets: 218 10*3/uL (ref 150–400)
RBC: 5 MIL/uL (ref 4.22–5.81)
RDW: 13.4 % (ref 11.5–15.5)
WBC: 6.3 10*3/uL (ref 4.0–10.5)
nRBC: 0 % (ref 0.0–0.2)

## 2019-06-01 NOTE — Addendum Note (Signed)
Addended by: Santiago Bur on: 06/01/2019 08:51 AM   Modules accepted: Orders

## 2019-06-01 NOTE — Addendum Note (Signed)
Addended by: Niko Penson on: 06/01/2019 08:51 AM   Modules accepted: Orders  

## 2019-06-01 NOTE — Addendum Note (Signed)
Addended by: Jarrett Ables D on: 06/01/2019 08:54 AM   Modules accepted: Orders

## 2019-06-01 NOTE — Addendum Note (Signed)
Addended by: Imberly Troxler on: 06/01/2019 08:51 AM   Modules accepted: Orders  

## 2019-06-01 NOTE — Patient Instructions (Signed)
You are here for follow up- you are on triumeq- we did labs today and will see you back in 4 months

## 2019-06-01 NOTE — Progress Notes (Signed)
NAME: Brian [name]  DOB: 11/21/1940  MRN: [id number]  Date/Time: 06/01/2019 10:30 AM Subjective:  Patient here for follow-up of HIV  Saw pat with spanish interpreter ? Brian [name] is a 78 y.o. male with a history of  HIV/AIDS, pacemaker for symptomatic bradycardia is here for follow-up.  He is currently on Triumeq and is 100% adherent to the medication.  No side effects from the medication.  Last viral load less than 20 and CD4 300 from November 2019.  He has not had labs since then and went for it only today.  Diagnosed in 2014 when he found out that his [place] in [place] had died of AIDS. He is originally from [place] but has been in [place] for the past 31 years HIV [facility] Nadir cd4 was 120 (5%) on 10/29/2013 VL 273,240 from 10/29/2013  HAARt history 1st regimen was complera 2nd regimen triumeq  Acquired thru- heterosexual contact Genotype highly sensitive organism  ? Past Medical History:  Diagnosis Date  . Arthritis   . Asthma   . Bradycardia   . Depression   . HIV (human immunodeficiency virus infection) (HCC)   . Hyperlipidemia   Osteoporosis Past Surgical History:  Procedure Laterality Date  . cataracts    . EYE SURGERY    . PACEMAKER INSERTION N/A 08/02/2015   Procedure: INSERTION PACEMAKER;  Surgeon: Corky DownsJaved Masoud, MD;  Location: [name];  Service: Cardiovascular;  Laterality: N/A;  prosthesis for urinary flow   SH Lives on his own 6 children and many grandchildren Non smoker No alcohol or illicit drug use   Family History  Problem Relation Age of Onset  . Hypertension Other    No Known Allergies    ?Current outpatient medications reviewed  Tramadol -acetaminophen Ventolin HFA Clonazepam 0.5 mg bid Celecoxib  Azelastine tamsulosin Omeprazole Dutasteride Paroxetine pregabalin Duloxetine triumeq REVIEW OF SYSTEMS:  Const: negative fever, negative chills, negative weight loss Eyes: negative diplopia or visual  changes, negative eye pain ENT: negative coryza, negative sore throat Resp: negative cough, hemoptysis, dyspnea Cards: negative for chest pain, palpitations, lower extremity edema GU: negative for frequency, dysuria and hematuria Skin: negative for rash and pruritus Heme: negative for easy bruising and gum/nose bleeding MS: negative for myalgias, arthralgias, back pain and muscle weakness Neurolo:negative for headaches, dizziness, vertigo, memory problems  Psych: negative for feelings of anxiety, depression   Objective:  VITALS:  BP (!) 173/103 (BP Location: Right Arm, Patient Position: Sitting, Cuff Size: Normal)   Pulse 89   Temp 97.9 F (36.6 C) (Oral)  PHYSICAL EXAM:  General: Alert, cooperative, no distress, appears stated age.  Head: Normocephalic, without obvious abnormality, atraumatic. Eyes: Conjunctivae clear, anicteric sclerae. Pupils are equal Nose: Nares normal. No drainage or sinus tenderness. Throat: Lips, mucosa, and tongue normal. No Thrush Neck: Supple, symmetrical, no adenopathy, thyroid: non tender no carotid bruit and no JVD. Back: No CVA tenderness. Lungs: Clear to auscultation bilaterally. No Wheezing or Rhonchi. No rales. Heart: Regular rate and rhythm, no murmur, rub or gallop.pacemaker site fine Abdomen: Soft, non-tender,not distended. Bowel sounds normal. No masses Extremities: Extremities normal, atraumatic, no cyanosis. No edema. No clubbing Skin: No rashes or lesions. Not Jaundiced Lymph: Cervical, supraclavicular normal. 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   Hyperlipidemia on atorvastatin  Takes some pain meds intermittently ( tramadol, , lyrica, duloxetine) Asked him not to take Celebrex  Need colonoscopy. ___________________________________________________ Discussed with patient Follow up in 4 months

## 2019-06-02 LAB — T-HELPER CELLS CD4/CD8 %
% CD 4 Pos. Lymph.: 19.3 % — ABNORMAL LOW (ref 30.8–58.5)
Absolute CD 4 Helper: 309 /uL — ABNORMAL LOW (ref 359–1519)
Basophils Absolute: 0 10*3/uL (ref 0.0–0.2)
Basos: 1 %
CD3+CD4+ Cells/CD3+CD8+ Cells Bld: 0.43 — ABNORMAL LOW (ref 0.92–3.72)
CD3+CD8+ Cells # Bld: 726 /uL (ref 109–897)
CD3+CD8+ Cells NFr Bld: 45.4 % — ABNORMAL HIGH (ref 12.0–35.5)
EOS (ABSOLUTE): 0.2 10*3/uL (ref 0.0–0.4)
Eos: 2 %
Hematocrit: 44.5 % (ref 37.5–51.0)
Hemoglobin: 14.5 g/dL (ref 13.0–17.7)
Immature Grans (Abs): 0 10*3/uL (ref 0.0–0.1)
Immature Granulocytes: 0 %
Lymphocytes Absolute: 1.6 10*3/uL (ref 0.7–3.1)
Lymphs: 26 %
MCH: 29.2 pg (ref 26.6–33.0)
MCHC: 32.6 g/dL (ref 31.5–35.7)
MCV: 90 fL (ref 79–97)
Monocytes Absolute: 0.6 10*3/uL (ref 0.1–0.9)
Monocytes: 9 %
Neutrophils Absolute: 3.8 10*3/uL (ref 1.4–7.0)
Neutrophils: 62 %
Platelets: 221 10*3/uL (ref 150–450)
RBC: 4.96 x10E6/uL (ref 4.14–5.80)
RDW: 14.6 % (ref 11.6–15.4)
WBC: 6.2 10*3/uL (ref 3.4–10.8)

## 2019-06-02 LAB — HIV-1 RNA QUANT-NO REFLEX-BLD
HIV 1 RNA Quant: 20 copies/mL
LOG10 HIV-1 RNA: 1.301 log10copy/mL

## 2019-06-29 ENCOUNTER — Ambulatory Visit: Payer: Medicare Other | Admitting: Infectious Diseases

## 2019-10-05 ENCOUNTER — Other Ambulatory Visit: Payer: Self-pay

## 2019-10-05 ENCOUNTER — Ambulatory Visit: Payer: Medicare Other | Admitting: Infectious Diseases

## 2019-10-05 ENCOUNTER — Telehealth: Payer: Self-pay | Admitting: Pharmacy Technician

## 2019-10-05 ENCOUNTER — Ambulatory Visit: Payer: Medicare Other | Attending: Infectious Diseases | Admitting: Infectious Diseases

## 2019-10-05 ENCOUNTER — Encounter: Payer: Self-pay | Admitting: Infectious Diseases

## 2019-10-05 DIAGNOSIS — B20 Human immunodeficiency virus [HIV] disease: Secondary | ICD-10-CM

## 2019-10-05 DIAGNOSIS — R001 Bradycardia, unspecified: Secondary | ICD-10-CM

## 2019-10-05 DIAGNOSIS — T50996A Underdosing of other drugs, medicaments and biological substances, initial encounter: Secondary | ICD-10-CM | POA: Diagnosis not present

## 2019-10-05 DIAGNOSIS — Z950 Presence of cardiac pacemaker: Secondary | ICD-10-CM

## 2019-10-05 DIAGNOSIS — Z79891 Long term (current) use of opiate analgesic: Secondary | ICD-10-CM

## 2019-10-05 DIAGNOSIS — Z9114 Patient's other noncompliance with medication regimen: Secondary | ICD-10-CM

## 2019-10-05 DIAGNOSIS — Z79899 Other long term (current) drug therapy: Secondary | ICD-10-CM

## 2019-10-05 DIAGNOSIS — Z91128 Patient's intentional underdosing of medication regimen for other reason: Secondary | ICD-10-CM | POA: Insufficient documentation

## 2019-10-05 DIAGNOSIS — Z21 Asymptomatic human immunodeficiency virus [HIV] infection status: Secondary | ICD-10-CM | POA: Diagnosis not present

## 2019-10-05 DIAGNOSIS — E785 Hyperlipidemia, unspecified: Secondary | ICD-10-CM | POA: Diagnosis not present

## 2019-10-05 MED ORDER — TRIUMEQ 600-50-300 MG PO TABS: 1.0000 | ORAL_TABLET | Freq: Every day | ORAL | 3 refills | Status: DC

## 2019-10-05 MED FILL — TRIUMEQ 600-50-300 MG TABS: 600-50-300 | 30 days supply | Qty: 30 | Fill #0

## 2019-10-05 NOTE — Telephone Encounter (Signed)
[facility] Patient Advocate Encounter  Patient has been set up to now get their medications mailed to them from [facility]. They will be followed by [facility] and receive monthly refill calls. I confirmed his address and added it to the [employer] system for UPS shipment. Patient speaks Spanish and will need interpreter services. Authorization was given to speak to his son Viyan Rosamond.

## 2019-10-05 NOTE — Progress Notes (Signed)
NAME: Brian [name]  DOB: November 24, 1940  MRN: [id number]  Date/Time: 10/05/2019 10:28 AM Subjective:  Patient here for follow-up of HIV  Used Interpretation service by phone and also Brian [name] was here at the end of the visit? Brian [name] is a 78 y.o. male with a history of  HIV/AIDS, pacemaker for symptomatic bradycardia is here for follow-up.  He is currently on Triumeq abut has not taken it for 2 months as he ran out of meds- [facility] but patient never called the clinic. He waited for his appt today He says he has been feeling dizzy and having nausea since he stopped his meds No fever, no diarrhea, no cough, no weight loss  Looking back at his previous records in [name] clinic pt had run out of meds once and never asked his provider for 2 weeks and was educated then to call the clinic- his son had accompanied him to that visit  Diagnosed in 2014 when he found out that his wife in [name] had died of AIDS. He is originally from there but has been in [place] for the past 31 years HIV diagnosed 2014 1st visit to [name] 11/25/13 Nadir cd4 was 120 (5%) on 10/29/2013 VL 273,240 from 10/29/2013  HAARt history 1st regimen was complera 2nd regimen triumeq As per Dr.Fitzgerald's note on 08/06/16  Recommendations HIV -he has had some issues with compliance with Complera in the past. His viral load has been slightly elevated now for several checks.  I would like to consider switching him to another regimen potentially more potent given the concern for developing resistance. He had pan sensitive virus on genotype in 2015. WIll check HLA [id number] and if neg start triumeq   Telephone Encounter - Brian Form, MD - 08/29/2016 11:35 AM EDT please let pt know that his testing we did to change his medicine to a stronger one was good  I have sent in a new medicine triumeq to take in place of the Complera. He should stop the complera once he gets the  triumeq. He should call if any new problems - otherwise I will see him in Jan   Electronically signed by Brian Form, MD at 08/29/2016 11:39 AM EDT   Acquired thru- heterosexual contact Genotype highly sensitive organism  ? Past Medical History:  Diagnosis Date  . Arthritis   . Asthma   . Bradycardia   . Depression   . HIV (human immunodeficiency virus infection) ([name])   . Hyperlipidemia   Osteoporosis Past Surgical History:  Procedure Laterality Date  . cataracts    . EYE SURGERY    . PACEMAKER INSERTION N/A 08/02/2015   Procedure: INSERTION PACEMAKER;  Surgeon: Brian Athens, MD;  Location: [name];  Service: Cardiovascular;  Laterality: N/A;  prosthesis for urinary flow   SH Lives on his own 6 children and many grandchildren Non smoker No alcohol or illicit drug use   Family History  Problem Relation Age of Onset  . Hypertension Other    No Known Allergies    ?Current outpatient medications reviewed  Tramadol -acetaminophen Ventolin HFA Clonazepam 0.5 mg bid Celecoxib  Azelastine tamsulosin Omeprazole Dutasteride Paroxetine pregabalin Duloxetine triumeq atorvastatin  REVIEW OF SYSTEMS:  Const: negative fever, negative chills, negative weight loss Eyes: negative diplopia or visual changes, negative eye pain ENT: negative coryza, negative sore throat Resp: negative cough, hemoptysis, dyspnea Cards: negative for chest pain, palpitations, lower extremity edema GU: negative for frequency,  dysuria and hematuria Skin: negative for rash and pruritus Heme: negative for easy bruising and gum/nose bleeding MS: negative for myalgias, arthralgias, back pain and muscle weakness Neurolo: dizziness, Psych: negative for feelings of anxiety, depression   Objective:  VITALS:  BP 111/80   Pulse 93   Temp 97.7 F (36.5 C) (Oral)   Resp 16   Ht _0  (1.676 m)   Wt 169 lb (76.7 kg)   SpO2 95%   BMI 27.28 kg/m  PHYSICAL EXAM:  General: Alert,  cooperative, no distress, appears stated age.  Head: Normocephalic, without obvious abnormality, atraumatic. Eyes: Conjunctivae clear, anicteric sclerae. Pupils are equal Nose: Nares normal. No drainage or sinus tenderness. Throat: Lips, mucosa, and tongue normal. No Thrush Neck: Supple, symmetrical, no adenopathy, thyroid: non tender no carotid bruit and no JVD. Back: No CVA tenderness. Lungs: Clear to auscultation bilaterally. No Wheezing or Rhonchi. No rales. Heart: Regular rate and rhythm, no murmur, rub or gallop.pacemaker site fine Abdomen: Soft, non-tender,not distended. Bowel sounds normal. No masses Extremities: Extremities normal, atraumatic, no cyanosis. No edema. No clubbing Skin: No rashes or lesions. Not Jaundiced Lymph: Cervical, supraclavicular normal. 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 Called [facility] to set up monthyly home delivery of the medication so that he will not miss any dose. Pt initially did not want to change pharmacy and I spoke to his son Brian [name] and he called him and convinced him.  Also this was an opportunity to change his medication -to avoid abacavir ( cardiac issues) but patient did not want to change meds So will continue  triumeq [id number] was done in 2017 but was not available- So went to [facility] and obtained a copy of the report- which is negative     Pt did not want to have his labs drawn today as he is very hungry and wanted to go home- He will come back in a month to see me Told him that of he experience any side effects he will need to call the clinic- [name] ( hospital interpretation service)  gave her number for patient to call or leave message and she will get in touch with [place].  Hyperlipidemia on atorvastatin  Pt to bring all his meds with him next visit  Spent 1 hour with the visit and 50% of the visit was counseling him on adherence, access to care, educating him on the process to get refills and coordinating  care with [facility], [facility], and [facility]  Follow up 1 month- will need labs

## 2019-10-05 NOTE — Patient Instructions (Signed)
You are here for your routine HIV visit- You have not been taking HIV meds for the [past 2 months as you ran out of refill- today we have changed your pharmacy to [facility] to make sure that you get your refills every month- please start the meds back and will see you in 1 month for a new blood test

## 2019-11-01 MED FILL — TRIUMEQ 600-50-300 MG TABS: 600-50-300 | 30 days supply | Qty: 30 | Fill #1

## 2019-11-09 ENCOUNTER — Other Ambulatory Visit
Admission: RE | Admit: 2019-11-09 | Discharge: 2019-11-09 | Disposition: A | Discharge status: Home / Self Care | Payer: Medicare Other | Source: Ambulatory Visit | Attending: Infectious Diseases | Admitting: Infectious Diseases

## 2019-11-09 ENCOUNTER — Other Ambulatory Visit: Payer: Self-pay

## 2019-11-09 ENCOUNTER — Ambulatory Visit: Payer: Medicare Other | Attending: Infectious Diseases | Admitting: Infectious Diseases

## 2019-11-09 DIAGNOSIS — Z21 Asymptomatic human immunodeficiency virus [HIV] infection status: Secondary | ICD-10-CM

## 2019-11-09 DIAGNOSIS — B20 Human immunodeficiency virus [HIV] disease: Secondary | ICD-10-CM | POA: Diagnosis present

## 2019-11-09 DIAGNOSIS — Z950 Presence of cardiac pacemaker: Secondary | ICD-10-CM

## 2019-11-09 DIAGNOSIS — Z79899 Other long term (current) drug therapy: Secondary | ICD-10-CM | POA: Diagnosis not present

## 2019-11-09 DIAGNOSIS — E785 Hyperlipidemia, unspecified: Secondary | ICD-10-CM

## 2019-11-09 LAB — COMPREHENSIVE METABOLIC PANEL
ALT: 25 U/L (ref 0–44)
AST: 28 U/L (ref 15–41)
Albumin: 4.3 g/dL (ref 3.5–5.0)
Alkaline Phosphatase: 39 U/L (ref 38–126)
Anion gap: 11 (ref 5–15)
BUN: 13 mg/dL (ref 8–23)
CO2: 27 mmol/L (ref 22–32)
Calcium: 10 mg/dL (ref 8.9–10.3)
Chloride: 101 mmol/L (ref 98–111)
Creatinine, Ser: 1.28 mg/dL — ABNORMAL HIGH (ref 0.61–1.24)
GFR calc Af Amer: >60 mL/min (ref >60)
GFR calc non Af Amer: 53 mL/min — ABNORMAL LOW (ref >60)
Glucose, Bld: 125 mg/dL — ABNORMAL HIGH (ref 70–99)
Potassium: 3.9 mmol/L (ref 3.5–5.1)
Sodium: 139 mmol/L (ref 135–145)
Total Bilirubin: 0.9 mg/dL (ref 0.3–1.2)
Total Protein: 8.3 g/dL — ABNORMAL HIGH (ref 6.5–8.1)

## 2019-11-09 NOTE — Patient Instructions (Signed)
You are here for follow up - you have been taking meds which is being supplied by [facility] Today will do labs and follow up three months   We talked about corona virus vaccine You are eligible to get it  [facility] Monday -Friday 7am-4pm  [address] [place], [place] [address] Hours:  Phone: [phone]

## 2019-11-09 NOTE — Progress Notes (Signed)
NAME: Brian [name]  DOB: [birth year]  MRN: [id number]  Date/Time: 11/09/2019 9:13 AM Subjective:  Patient here for follow-up of HIV   Brian [name] Brian [name] is a 79 y.o. male with a history of  HIV/AIDS, pacemaker for symptomatic bradycardia is here for follow-up.  I saw patient a month ago for a routine visit and during that visit he told he had not been taking the HIV med Triumeq  as he ran out of refills and [facility] and he waited till his appt.  I discussed with his son and we switched his pharmacy to [facility] and they have been mailing his meds to him. He has been 100% adherent and is feeling well. No fever, no chills, no dizziness, no nausea or vomiting  no diarrhea, no cough, no weight loss      Diagnosed in 2014 when he found out that his wife in [place] had died of AIDS. He is originally from there but has been in [place] for the past 31 years HIV diagnosed 2014 1st visit to [facility] 11/25/13 Nadir cd4 was 120 (5%) on 10/29/2013 VL 273,240 from 10/29/2013  HAARt history 1st regimen was complera 2nd regimen triumeq As per Dr.Fitzgerald's note on 08/06/16  Recommendations HIV -he has had some issues with compliance with Complera in the past. His viral load has been slightly elevated now for several checks.  I would like to consider switching him to another regimen potentially more potent given the concern for developing resistance. He had pan sensitive virus on genotype in 2015. WIll check HLA [id number] and if neg start triumeq   Telephone Encounter - [name], MD - 08/29/2016 11:35 AM EDT please let pt know that his testing we did to change his medicine to a stronger one was good  I have sent in a new medicine triumeq to take in place of the Complera. He should stop the complera once he gets the triumeq. He should call if any new problems - otherwise I will see him in Jan   Electronically signed by  [name], MD at 08/29/2016 11:39 AM EDT   Acquired thru- heterosexual contact Genotype highly sensitive organism  ? Past Medical History:  Diagnosis Date  . Arthritis   . Asthma   . Bradycardia   . Depression   . HIV (human immunodeficiency virus infection) ([place])   . Hyperlipidemia   Osteoporosis Past Surgical History:  Procedure Laterality Date  . cataracts    . EYE SURGERY    . PACEMAKER INSERTION N/A 08/02/2015   Procedure: INSERTION PACEMAKER;  Surgeon: Cletis Athens, MD;  Location: [name];  Service: Cardiovascular;  Laterality: N/A;  prosthesis for urinary flow   SH Lives on his own 6 children and many grandchildren Non smoker No alcohol or illicit drug use   Family History  Problem Relation Age of Onset  . Hypertension Other    No Known Allergies    ?Current outpatient medications reviewed  Tramadol -acetaminophen Ventolin HFA Clonazepam 0.5 mg bid Celecoxib  Azelastine tamsulosin Omeprazole Dutasteride Paroxetine pregabalin Duloxetine triumeq atorvastatin  REVIEW OF SYSTEMS:  Const: negative fever, negative chills, negative weight loss Eyes: negative diplopia or visual changes, negative eye pain ENT: negative coryza, negative sore throat Resp: negative cough, hemoptysis, dyspnea Cards: negative for chest pain, palpitations, lower extremity edema GU: negative for frequency, dysuria and hematuria Skin: negative for rash and pruritus Heme: negative for easy bruising and gum/nose bleeding MS:  negative for myalgias, arthralgias, back pain and muscle weakness Neurolo: no headache or dizziness Psych: negative for feelings of anxiety, depression   Objective:  VITALS:  There were no vitals taken for this visit. PHYSICAL EXAM:  General: Alert, cooperative, no distress, appears stated age.  Head: Normocephalic, without obvious abnormality, atraumatic. Eyes: Conjunctivae clear, anicteric sclerae. Pupils are equal Nose: Nares normal.  No drainage or sinus tenderness. Throat: Lips, mucosa, and tongue normal. No Thrush Neck: Supple, symmetrical, no adenopathy, thyroid: non tender no carotid bruit and no JVD. Back: No CVA tenderness. Lungs: Clear to auscultation bilaterally. No Wheezing or Rhonchi. No rales. Heart: Regular rate and rhythm, no murmur, rub or gallop.pacemaker site fine Abdomen: Soft, non-tender,not distended. Bowel sounds normal. No masses Extremities: Extremities normal, atraumatic, no cyanosis. No edema. No clubbing Skin: No rashes or lesions. Not Jaundiced Lymph: Cervical, supraclavicular normal. Neurologic: Grossly non-focal Pertinent Labs Viral load less than 20 and CD4 309  Aug 2020   Health maintenance Vaccination   Vaccine Date last given comment  Influenza    Hepatitis B    Hepatitis A    Prevnar-PCV-13 12/01/18   Pneumovac-PPSV-23 2016   TdaP 12/01/18   HPV    Shingrix ( zoster vaccine)     ______________________  Labs Lab Result  Date comment  HIV VL 20 06/01/19   CD4 309 (19.3%) 06/01/19   Genotype Sensitive virus 10/29/2013 Genosure prime  [id number] NEgative 08/06/16   HIV antibody     RPR     Quantiferon Gold neg 09/03/18   Hep C ab NR 09/03/18   Hepatitis B-ab,ag,c     Hepatitis A-IgM, IgG /T     Lipid     GC/CHL          HB,PLT,Cr, LFT 14.5/218/0.99 06/01/19     Preventive  Procedure Result  Date comment  colonoscopy          Dental exam     Opthal        Impression/Recommendation ?HIV /AIDS- on Triumeq  He restarted it last visit a month ago after a gap of 2 months [facility] is [facility] and they mail it to him   Hyperlipidemia on atorvastatin  Spent  50% of the visit  counseling him on corona virus vaccine and going over the risk/benefits and gave him the [facility] address ( [address])   Labs today CBC/CD4/HIV RNA/CMP) He will need health maintenance update ( RPR. Lipid. quantiferon gold, hepatitis serology, hba1c) next visit Follow up 6 months

## 2019-11-10 LAB — T-HELPER CELLS CD4/CD8 %
% CD 4 Pos. Lymph.: 23.7 % — ABNORMAL LOW (ref 30.8–58.5)
Absolute CD 4 Helper: 427 /uL (ref 359–1519)
Basophils Absolute: 0 10*3/uL (ref 0.0–0.2)
Basos: 1 %
CD3+CD4+ Cells/CD3+CD8+ Cells Bld: 0.57 — ABNORMAL LOW (ref 0.92–3.72)
CD3+CD8+ Cells # Bld: 749 /uL (ref 109–897)
CD3+CD8+ Cells NFr Bld: 41.6 % — ABNORMAL HIGH (ref 12.0–35.5)
EOS (ABSOLUTE): 0.1 10*3/uL (ref 0.0–0.4)
Eos: 2 %
Hematocrit: 49 % (ref 37.5–51.0)
Hemoglobin: 17 g/dL (ref 13.0–17.7)
Immature Grans (Abs): 0 10*3/uL (ref 0.0–0.1)
Immature Granulocytes: 0 %
Lymphocytes Absolute: 1.8 10*3/uL (ref 0.7–3.1)
Lymphs: 24 %
MCH: 29.7 pg (ref 26.6–33.0)
MCHC: 34.7 g/dL (ref 31.5–35.7)
MCV: 86 fL (ref 79–97)
Monocytes Absolute: 0.7 10*3/uL (ref 0.1–0.9)
Monocytes: 10 %
Neutrophils Absolute: 4.7 10*3/uL (ref 1.4–7.0)
Neutrophils: 63 %
Platelets: 232 10*3/uL (ref 150–450)
RBC: 5.72 x10E6/uL (ref 4.14–5.80)
RDW: 15.4 % (ref 11.6–15.4)
WBC: 7.4 10*3/uL (ref 3.4–10.8)

## 2019-11-10 LAB — HIV-1 RNA QUANT-NO REFLEX-BLD
HIV 1 RNA Quant: 50 copies/mL
LOG10 HIV-1 RNA: 1.699 log10copy/mL

## 2019-11-29 MED FILL — TRIUMEQ 600-50-300 MG TABS: 600-50-300 | 30 days supply | Qty: 30 | Fill #2

## 2019-12-30 MED FILL — TRIUMEQ 600-50-300 MG TABS: 600-50-300 | 30 days supply | Qty: 30 | Fill #3

## 2020-01-12 ENCOUNTER — Encounter: Payer: Self-pay | Admitting: Emergency Medicine

## 2020-01-12 ENCOUNTER — Emergency Department: Payer: Medicare Other

## 2020-01-12 ENCOUNTER — Emergency Department
Admission: EM | Admit: 2020-01-12 | Discharge: 2020-01-12 | Disposition: A | Discharge status: Home / Self Care | Payer: Medicare Other | Attending: Emergency Medicine | Admitting: Emergency Medicine

## 2020-01-12 ENCOUNTER — Other Ambulatory Visit: Payer: Self-pay

## 2020-01-12 DIAGNOSIS — Z5321 Procedure and treatment not carried out due to patient leaving prior to being seen by health care provider: Secondary | ICD-10-CM | POA: Insufficient documentation

## 2020-01-12 DIAGNOSIS — R519 Headache, unspecified: Secondary | ICD-10-CM | POA: Diagnosis not present

## 2020-01-12 NOTE — ED Notes (Signed)
Pt presentation discussed with EDP, Siadecki; see new orders. 

## 2020-01-12 NOTE — ED Notes (Signed)
This RN did second pt call. Pt not in lobby, restroom or outside.
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
## 2020-01-28 ENCOUNTER — Other Ambulatory Visit: Payer: Self-pay | Admitting: Infectious Diseases

## 2020-01-28 DIAGNOSIS — B20 Human immunodeficiency virus [HIV] disease: Secondary | ICD-10-CM

## 2020-01-28 MED ORDER — TRIUMEQ 600-50-300 MG PO TABS: 1.0000 | ORAL_TABLET | Freq: Every day | ORAL | 5 refills | Status: DC

## 2020-01-28 MED FILL — TRIUMEQ 600-50-300 MG TABS: 600-50-300 | 30 days supply | Qty: 30 | Fill #0

## 2020-02-03 ENCOUNTER — Other Ambulatory Visit
Admission: RE | Admit: 2020-02-03 | Discharge: 2020-02-03 | Disposition: A | Discharge status: Home / Self Care | Payer: Medicare Other | Source: Ambulatory Visit | Attending: Infectious Diseases | Admitting: Infectious Diseases

## 2020-02-03 ENCOUNTER — Encounter: Payer: Self-pay | Admitting: Infectious Diseases

## 2020-02-03 ENCOUNTER — Other Ambulatory Visit: Payer: Self-pay

## 2020-02-03 ENCOUNTER — Ambulatory Visit: Payer: Medicare Other | Attending: Infectious Diseases | Admitting: Infectious Diseases

## 2020-02-03 VITALS — BP 185/109 | HR 83 | Temp 98.4°F | Resp 16 | Ht 67.0 in | Wt 166.0 lb

## 2020-02-03 DIAGNOSIS — N644 Mastodynia: Secondary | ICD-10-CM | POA: Insufficient documentation

## 2020-02-03 DIAGNOSIS — Z79899 Other long term (current) drug therapy: Secondary | ICD-10-CM | POA: Diagnosis not present

## 2020-02-03 DIAGNOSIS — E785 Hyperlipidemia, unspecified: Secondary | ICD-10-CM | POA: Insufficient documentation

## 2020-02-03 DIAGNOSIS — I159 Secondary hypertension, unspecified: Secondary | ICD-10-CM

## 2020-02-03 DIAGNOSIS — B20 Human immunodeficiency virus [HIV] disease: Secondary | ICD-10-CM | POA: Insufficient documentation

## 2020-02-03 DIAGNOSIS — Z7901 Long term (current) use of anticoagulants: Secondary | ICD-10-CM | POA: Diagnosis not present

## 2020-02-03 DIAGNOSIS — I10 Essential (primary) hypertension: Secondary | ICD-10-CM | POA: Insufficient documentation

## 2020-02-03 DIAGNOSIS — Z950 Presence of cardiac pacemaker: Secondary | ICD-10-CM | POA: Diagnosis not present

## 2020-02-03 LAB — COMPREHENSIVE METABOLIC PANEL
ALT: 23 U/L (ref 0–44)
AST: 30 U/L (ref 15–41)
Albumin: 4.4 g/dL (ref 3.5–5.0)
Alkaline Phosphatase: 46 U/L (ref 38–126)
Anion gap: 10 (ref 5–15)
BUN: 11 mg/dL (ref 8–23)
CO2: 22 mmol/L (ref 22–32)
Calcium: 8.8 mg/dL — ABNORMAL LOW (ref 8.9–10.3)
Chloride: 106 mmol/L (ref 98–111)
Creatinine, Ser: 0.86 mg/dL (ref 0.61–1.24)
GFR calc Af Amer: >60 mL/min (ref >60)
GFR calc non Af Amer: >60 mL/min (ref >60)
Glucose, Bld: 118 mg/dL — ABNORMAL HIGH (ref 70–99)
Potassium: 3.5 mmol/L (ref 3.5–5.1)
Sodium: 138 mmol/L (ref 135–145)
Total Bilirubin: 0.9 mg/dL (ref 0.3–1.2)
Total Protein: 8 g/dL (ref 6.5–8.1)

## 2020-02-03 LAB — HEPATITIS C ANTIBODY: HCV Ab: NONREACTIVE

## 2020-02-03 NOTE — Addendum Note (Signed)
Addended by: Toluwani Ruder on: 02/03/2020 11:05 AM   Modules accepted: Orders  

## 2020-02-03 NOTE — Progress Notes (Signed)
NAME: Brian [name]  DOB: [birth year]  MRN: [id number]  Date/Time: 02/03/2020 9:27 AM Subjective:  Patient here for follow-up of HIV    Brian [name]  here for spanish interpretation Brian [name] is a 79 y.o. male with a history of  HIV/AIDS, pacemaker for symptomatic bradycardia, Hyperlipidemia,  is here for follow-up.   pcp Brian [name] [phone]  Pt  Diagnosed in 2014 when he found out that his wife in [place] had died of AIDS. He is originally from there but has been in [place] for the past 31 years HIV diagnosed 2014 1st visit to [facility] 11/25/13 Nadir cd4 was 120 (5%) on 10/29/2013 VL 273,240 from 10/29/2013  HAARt history 1st regimen was complera 2nd regimen triumeq As per Brian [name]'s note on 08/06/16  Recommendations HIV -he has had some issues with compliance with Complera in the past. His viral load has been slightly elevated now for several checks.  I would like to consider switching him to another regimen potentially more potent given the concern for developing resistance. He had pan sensitive virus on genotype in 2015. WIll check HLA [id number] and if neg start triumeq   Telephone Encounter - Brian Form, MD - 08/29/2016 11:35 AM EDT please let pt know that his testing we did to change his medicine to a stronger one was good  I have sent in a new medicine triumeq to take in place of the Complera. He should stop the complera once he gets the triumeq. He should call if any new problems - otherwise I will see him in Jan   Electronically signed by Brian Form, MD at 08/29/2016 11:39 AM EDT   Acquired thru- heterosexual contact Genotype highly sensitive organism  ? Past Medical History:  Diagnosis Date  . Arthritis   . Asthma   . Bradycardia   . Depression   . HIV (human immunodeficiency virus infection) ([place])   . Hyperlipidemia   Osteoporosis   Past Surgical History:  Procedure Laterality Date  . cataracts    . EYE SURGERY    . PACEMAKER  INSERTION N/A 08/02/2015   Procedure: INSERTION PACEMAKER;  Surgeon: Brian Athens, MD;  Location: [name];  Service: Cardiovascular;  Laterality: N/A;  prosthesis for urinary flow   SH Lives on his own 6 children and many grandchildren Non smoker No alcohol or illicit drug use   Family History  Problem Relation Age of Onset  . Hypertension Other    No Known Allergies    ?Current outpatient medications reviewed  Tramadol -acetaminophen Ventolin HFA Clonazepam 0.5 mg bid Celecoxib  Azelastine tamsulosin Omeprazole Dutasteride Paroxetine pregabalin Duloxetine triumeq atorvastatin Lisinopril 20m -( stopped this medicine)Alendronate 790monce a week  REVIEW OF SYSTEMS:  Const: negative fever, negative chills, negative weight loss Eyes: negative diplopia or visual changes, negative eye pain ENT: negative coryza, negative sore throat Resp: negative cough, hemoptysis, dyspnea Cards: negative for chest pain, palpitations, lower extremity edema GU: negative for frequency, dysuria and hematuria Skin: negative for rash and pruritus Heme: negative for easy bruising and gum/nose bleeding MS: negative for myalgias, arthralgias, back pain and muscle weakness Neurolo: no headache or dizziness Psych: negative for feelings of anxiety, depression   Objective:  VITALS:  BP (!) 185/109   Pulse 83   Temp 98.4 F (36.9 C) (Oral)   Resp 16   Ht 5' 7" (1.702 m)   Wt 166 lb (75.3 kg)   SpO2 96%   BMI 26.00 kg/m     PHYSICAL EXAM:  General: Alert,  cooperative, no distress, appears stated age.  Head: Normocephalic, without obvious abnormality, atraumatic. Eyes: Conjunctivae clear, anicteric sclerae. Pupils are equal Nose: Nares normal. No drainage or sinus tenderness. Throat: Lips, mucosa, and tongue normal. No Thrush Neck: Supple, symmetrical, no adenopathy, thyroid: non tender no carotid bruit and no JVD. Back: No CVA tenderness. Lungs: Clear to auscultation bilaterally.  No Wheezing or Rhonchi. No rales. Heart: Regular rate and rhythm, no murmur, rub or gallop.pacemaker site fine Abdomen: Soft, non-tender,not distended. Bowel sounds normal. No masses Extremities: Extremities normal, atraumatic, no cyanosis. No edema. No clubbing Skin: No rashes or lesions. Not Jaundiced Lymph: Cervical, supraclavicular normal. Neurologic: Grossly non-focal  Tender Rt breast Near areola- no discharge, no warmth , no redness   Health maintenance Vaccination   Vaccine Date last given comment  Influenza    Hepatitis B    Hepatitis A    Prevnar-PCV-13 12/01/18   Pneumovac-PPSV-23 2016   TdaP 12/01/18   HPV    Shingrix ( zoster vaccine)     ______________________  Labs Lab Result  Date comment  HIV VL 50 11/09/19   CD4 427 (23.7%) 11/09/19   Genotype Sensitive virus 10/29/2013 Genosure prime  [id number] NEgative 08/06/16   HIV antibody     RPR     Quantiferon Gold neg 09/03/18   Hep C ab NR 09/03/18   Hepatitis B-ab,ag,c     Hepatitis A-IgM, IgG /T     Lipid     GC/CHL          HB,PLT,Cr, LFT 14.5/218/0.99 06/01/19     Preventive  Procedure Result  Date comment  colonoscopy          Dental exam     Opthal        Impression/Recommendation ?HIV /AIDS- on Triumeq  100% adherent now Last vl 40 and cd4 is 425  HTN- uncontrolled- called his NP Brian [name] as patient had mentioned he stopped the BP medicine as he was told to- She confirmed that he must be taking it Made an appt for this afternoon to see her celocoxib should be stopped  Rt breast tenderness- no erythema or warmth this is not an abscess- r/o gynecomastia VS ?? Tumor- will need biopsy- PCP aware and has already treated him as abscess with augmentin March 2021  Hyperlipidemia on atorvastatin    Pt told the interpretor that an agency had called yesterday and got 72 $ from him We called the agency( [employer]) and confirmed that it is a debt collecting agency and [facility] had sent the bill to the agency for the service he received on 12/01/18 in my clinic for 2 vaccines- tdap and  prevnar( confirmation number [id number]) Ref acct # [id number]  Pt is counseled about any bills he receive that he should check with our interpretor service.  Labs today- lipid, HIV CBC/CD4/HIV RNA/CMP)

## 2020-02-03 NOTE — Addendum Note (Signed)
Addended by: Bonne Whack on: 02/03/2020 11:06 AM   Modules accepted: Orders  

## 2020-02-03 NOTE — Patient Instructions (Addendum)
You are here for follow up for HIV You are on Triumeq Last Vl 50, cd4 is 427 Today BP is 185/109- you have stopped taking lisinopril. I spoke to your PCP - Clint Guy- And she did not stop it So please start lisinopril You have an appt with her this afternoon at 3.45PM You will see you me in 6 months

## 2020-02-03 NOTE — Addendum Note (Signed)
Addended by: Niccole Witthuhn on: 02/03/2020 11:05 AM   Modules accepted: Orders  

## 2020-02-03 NOTE — Addendum Note (Signed)
Addended by: Deloria Lair on: 02/03/2020 11:06 AM   Modules accepted: Orders

## 2020-02-03 NOTE — Addendum Note (Signed)
Addended by: Elyas Villamor on: 02/03/2020 11:06 AM   Modules accepted: Orders  

## 2020-02-03 NOTE — Addendum Note (Signed)
Addended by: Deloria Lair on: 02/03/2020 11:05 AM   Modules accepted: Orders

## 2020-02-04 LAB — HIV-1 RNA QUANT-NO REFLEX-BLD
HIV 1 RNA Quant: 330 copies/mL
LOG10 HIV-1 RNA: 2.519 log10copy/mL

## 2020-02-04 LAB — RPR: RPR Ser Ql: NONREACTIVE

## 2020-02-05 LAB — T-HELPER CELLS CD4/CD8 %
% CD 4 Pos. Lymph.: 25.9 % — ABNORMAL LOW (ref 30.8–58.5)
Absolute CD 4 Helper: 259 /uL — ABNORMAL LOW (ref 359–1519)
Basophils Absolute: 0.1 10*3/uL (ref 0.0–0.2)
Basos: 1 %
CD3+CD4+ Cells/CD3+CD8+ Cells Bld: 0.79 — ABNORMAL LOW (ref 0.92–3.72)
CD3+CD8+ Cells # Bld: 329 /uL (ref 109–897)
CD3+CD8+ Cells NFr Bld: 32.9 % (ref 12.0–35.5)
EOS (ABSOLUTE): 0.1 10*3/uL (ref 0.0–0.4)
Eos: 1 %
Hematocrit: 46.3 % (ref 37.5–51.0)
Hemoglobin: 15.6 g/dL (ref 13.0–17.7)
Immature Grans (Abs): 0 10*3/uL (ref 0.0–0.1)
Immature Granulocytes: 0 %
Lymphocytes Absolute: 1 10*3/uL (ref 0.7–3.1)
Lymphs: 13 %
MCH: 29.4 pg (ref 26.6–33.0)
MCHC: 33.7 g/dL (ref 31.5–35.7)
MCV: 87 fL (ref 79–97)
Monocytes Absolute: 0.6 10*3/uL (ref 0.1–0.9)
Monocytes: 8 %
Neutrophils Absolute: 6.3 10*3/uL (ref 1.4–7.0)
Neutrophils: 77 %
Platelets: 176 10*3/uL (ref 150–450)
RBC: 5.3 x10E6/uL (ref 4.14–5.80)
RDW: 15.2 % (ref 11.6–15.4)
WBC: 8.2 10*3/uL (ref 3.4–10.8)

## 2020-02-06 LAB — QUANTIFERON-TB GOLD PLUS (RQFGPL)
QuantiFERON Mitogen Value: >10 IU/mL
QuantiFERON Nil Value: 0.05 IU/mL
QuantiFERON TB1 Ag Value: 0.05 IU/mL
QuantiFERON TB2 Ag Value: 0.06 IU/mL

## 2020-02-06 LAB — QUANTIFERON-TB GOLD PLUS: QuantiFERON-TB Gold Plus: NEGATIVE

## 2020-02-16 ENCOUNTER — Encounter: Payer: Self-pay | Admitting: Urology

## 2020-02-16 ENCOUNTER — Ambulatory Visit: Payer: Medicare Other | Admitting: Urology

## 2020-02-28 MED FILL — TRIUMEQ 600-50-300 MG TABS: 600-50-300 | 30 days supply | Qty: 30 | Fill #1

## 2020-03-30 MED FILL — TRIUMEQ 600-50-300 MG TABS: 600-50-300 | 30 days supply | Qty: 30 | Fill #2

## 2020-04-27 ENCOUNTER — Other Ambulatory Visit: Payer: Self-pay | Admitting: Family Medicine

## 2020-04-27 DIAGNOSIS — N644 Mastodynia: Secondary | ICD-10-CM

## 2020-04-27 MED FILL — TRIUMEQ 600-50-300 MG TABS: 600-50-300 | 30 days supply | Qty: 30 | Fill #3

## 2020-05-03 ENCOUNTER — Other Ambulatory Visit: Payer: Self-pay

## 2020-05-03 ENCOUNTER — Encounter: Payer: Self-pay | Admitting: Internal Medicine

## 2020-05-03 ENCOUNTER — Ambulatory Visit (INDEPENDENT_AMBULATORY_CARE_PROVIDER_SITE_OTHER): Payer: Medicare Other | Admitting: Internal Medicine

## 2020-05-03 VITALS — BP 153/89 | HR 85 | Ht 66.0 in | Wt 165.4 lb

## 2020-05-03 DIAGNOSIS — R55 Syncope and collapse: Secondary | ICD-10-CM

## 2020-05-03 DIAGNOSIS — B20 Human immunodeficiency virus [HIV] disease: Secondary | ICD-10-CM | POA: Diagnosis not present

## 2020-05-03 DIAGNOSIS — G629 Polyneuropathy, unspecified: Secondary | ICD-10-CM | POA: Insufficient documentation

## 2020-05-03 DIAGNOSIS — R079 Chest pain, unspecified: Secondary | ICD-10-CM

## 2020-05-03 DIAGNOSIS — G6289 Other specified polyneuropathies: Secondary | ICD-10-CM | POA: Diagnosis not present

## 2020-05-03 DIAGNOSIS — Z950 Presence of cardiac pacemaker: Secondary | ICD-10-CM | POA: Insufficient documentation

## 2020-05-03 NOTE — Progress Notes (Signed)
New Patient Office Visit  SUBJECTIVE:  Subjective  Patient ID: Brian [name], male    DOB: Dec 13, 1940  Age: 79 y.o. MRN: [id number]  CC:  Chief Complaint  Patient presents with  . cardiology consult    patient was referred by primary care     HPI Brian [name] is a 79 y.o. male presenting today for a cardiology consultation as referred by her primary care physician. He is here today with his son.   He has had several syncope episodes over the last several months. He states that he feels dizzy when he stands up. He denies chest pain. He has a pacemaker in place for symptomatic bradycardia, which was placed by Dr. Juel Burrow in 08/02/2015. It has not been checked since his surgery.   Echocardiogram on 11/01/2019 revealed an ejection fracture of 47%.   He has a history of HIV/AIDS. He was diagnosed in February 18, 2013 after the death of his wife with the same disease. He is currently being followed and treated by Dr. Lynn Ito in Infectious Diseases for this.   Past Medical History:  Diagnosis Date  . Arthritis   . Asthma   . Bradycardia   . Depression   . HIV (human immunodeficiency virus infection) ([facility])   . Hyperlipidemia     Past Surgical History:  Procedure Laterality Date  . cataracts    . EYE SURGERY    . PACEMAKER INSERTION N/A 08/02/2015   Procedure: INSERTION PACEMAKER;  Surgeon: Corky Downs, MD;  Location: [name];  Service: Cardiovascular;  Laterality: N/A;    Family History  Problem Relation Age of Onset  . Hypertension Other     Social History   Socioeconomic History  . Marital status: Married    Spouse name: Not on file  . Number of children: Not on file  . Years of education: Not on file  . Highest education level: Not on file  Occupational History  . Not on file  Tobacco Use  . Smoking status: Never Smoker  . Smokeless tobacco: Never Used  Vaping Use  . Vaping Use: Never used  Substance and Sexual Activity  . Alcohol use: Yes    Alcohol/week:  2.0 standard drinks    Types: 2 Cans of beer per week    Comment: 1-2 drinks a day  . Drug use: No  . Sexual activity: Not on file  Other Topics Concern  . Not on file  Social History Narrative  . Not on file   Social Determinants of Health   Financial Resource Strain:   . Difficulty of Paying Living Expenses:   Food Insecurity:   . Worried About Programme researcher, broadcasting/film/video in the Last Year:   . Barista in the Last Year:   Transportation Needs:   . Freight forwarder (Medical):   Marland Kitchen Lack of Transportation (Non-Medical):   Physical Activity:   . Days of Exercise per Week:   . Minutes of Exercise per Session:   Stress:   . Feeling of Stress :   Social Connections:   . Frequency of Communication with Friends and Family:   . Frequency of Social Gatherings with Friends and Family:   . Attends Religious Services:   . Active Member of Clubs or Organizations:   . Attends Banker Meetings:   Marland Kitchen Marital Status:   Intimate Partner Violence:   . Fear of Current or Ex-Partner:   . Emotionally Abused:   Marland Kitchen Physically Abused:   .  Sexually Abused:      Current Outpatient Medications:  .  clonazePAM (KLONOPIN) 0.5 MG tablet, Take 0.5 mg by mouth 2 (two) times daily as needed for anxiety., Disp: , Rfl:  .  cyclobenzaprine (FLEXERIL) 5 MG tablet, Take 5 mg by mouth 3 (three) times daily as needed for muscle spasms., Disp: , Rfl:  .  lisinopril (ZESTRIL) 10 MG tablet, Take 10 mg by mouth daily., Disp: , Rfl:  .  loratadine (CLARITIN) 10 MG tablet, Take 10 mg by mouth daily., Disp: , Rfl:  .  tadalafil (CIALIS) 5 MG tablet, Take 5 mg by mouth daily as needed for erectile dysfunction., Disp: , Rfl:  .  tiZANidine (ZANAFLEX) 4 MG capsule, Take 4 mg by mouth 3 (three) times daily., Disp: , Rfl:  .  abacavir-dolutegravir-lamiVUDine (TRIUMEQ) 600-50-300 MG tablet, Take 1 tablet by mouth daily. (Patient not taking: Reported on 05/03/2020), Disp: 30 tablet, Rfl: 5 .  albuterol  (PROVENTIL) (2.5 MG/3ML) 0.083% nebulizer solution, Inhale 2.5 mg into the lungs 2 (two) times daily as needed. For shortness of breath and/or wheezing (Patient not taking: Reported on 05/03/2020), Disp: , Rfl:  .  alendronate (FOSAMAX) 70 MG tablet, Take 70 mg by mouth once a week. Take on Saturday. (Patient not taking: Reported on 05/03/2020), Disp: , Rfl: 5 .  atorvastatin (LIPITOR) 40 MG tablet, Take 40 mg by mouth daily., Disp: , Rfl:  .  azelastine (OPTIVAR) 0.05 % ophthalmic solution, Place 1 drop into both eyes 2 (two) times daily. (Patient not taking: Reported on 05/03/2020), Disp: , Rfl: 5 .  celecoxib (CELEBREX) 200 MG capsule, Take 200 mg by mouth 2 (two) times daily., Disp: , Rfl: 5 .  dutasteride (AVODART) 0.5 MG capsule, Take 0.5 mg by mouth daily. (Patient not taking: Reported on 05/03/2020), Disp: , Rfl: 5 .  famotidine (PEPCID) 20 MG tablet, Take 1 tablet (20 mg total) by mouth 2 (two) times daily., Disp: 60 tablet, Rfl: 1 .  KLOR-CON M10 10 MEQ tablet, Take 10 mEq by mouth daily., Disp: , Rfl: 2 .  LYRICA 50 MG capsule, Take 50 mg by mouth 2 (two) times daily., Disp: , Rfl: 2 .  metaxalone (SKELAXIN) 800 MG tablet, Take 800 mg by mouth 2 (two) times daily. (Patient not taking: Reported on 05/03/2020), Disp: , Rfl: 5 .  omeprazole (PRILOSEC) 20 MG capsule, Take 20 mg by mouth daily., Disp: , Rfl: 5 .  oxyCODONE (OXY IR/ROXICODONE) 5 MG immediate release tablet, Take 1-2 tablets (5-10 mg total) by mouth every 4 (four) hours as needed for moderate pain. (Patient not taking: Reported on 05/03/2020), Disp: 30 tablet, Rfl: 0 .  PARoxetine (PAXIL) 20 MG tablet, Take 20 mg by mouth daily. (Patient not taking: Reported on 05/03/2020), Disp: , Rfl:  .  tamsulosin (FLOMAX) 0.4 MG CAPS capsule, Take 0.4 mg by mouth daily. (Patient not taking: Reported on 05/03/2020), Disp: , Rfl: 5 .  traMADol (ULTRAM) 50 MG tablet, Take 50 mg by mouth 3 (three) times daily as needed. For pain. (Patient not taking: Reported on  05/03/2020), Disp: , Rfl: 2 .  VENTOLIN HFA 108 (90 BASE) MCG/ACT inhaler, Inhale 2 puffs into the lungs 4 (four) times daily as needed. For shortness of breath and/or wheezing., Disp: , Rfl: 5 .  Vitamin D, Ergocalciferol, (DRISDOL) 1.25 MG (50000 UT) CAPS capsule, Take 1 capsule by mouth once a week., Disp: , Rfl: 5   No Known Allergies  ROS Review of Systems  Constitutional:  Negative.   HENT: Negative.   Eyes: Negative.   Respiratory: Negative.   Cardiovascular: Negative.  Negative for chest pain and palpitations.  Gastrointestinal: Negative.   Endocrine: Negative.   Genitourinary: Negative.   Musculoskeletal: Positive for arthralgias and back pain.  Skin: Negative.   Allergic/Immunologic: Positive for environmental allergies.  Neurological: Positive for dizziness and syncope (5-6 episodes).  Hematological: Negative.   Psychiatric/Behavioral: Negative.   All other systems reviewed and are negative.    OBJECTIVE:    Physical Exam Vitals reviewed.  Constitutional:      Appearance: Normal appearance.  Neck:     Vascular: No carotid bruit.  Cardiovascular:     Rate and Rhythm: Normal rate and regular rhythm.     Pulses: Normal pulses.     Heart sounds: Normal heart sounds.  Pulmonary:     Effort: Pulmonary effort is normal.     Breath sounds: Normal breath sounds.  Abdominal:     General: Bowel sounds are normal.     Palpations: Abdomen is soft. There is no hepatomegaly or splenomegaly.     Tenderness: There is no abdominal tenderness.     Hernia: No hernia is present.  Musculoskeletal:     Right lower leg: No edema.     Left lower leg: No edema.  Skin:    Findings: No rash.  Neurological:     Mental Status: He is alert and oriented to person, place, and time.  Psychiatric:        Mood and Affect: Mood normal.        Behavior: Behavior normal.     BP (!) 153/89   Pulse 85   Ht 5\' 6"  (1.676 m)   Wt 165 lb 6.4 oz (75 kg)   BMI 26.70 kg/m  Wt Readings from  Last 3 Encounters:  05/03/20 165 lb 6.4 oz (75 kg)  02/03/20 166 lb (75.3 kg)  10/05/19 169 lb (76.7 kg)    Health Maintenance Due  Topic Date Due  . COVID-19 Vaccine (1) Never done    There are no preventive care reminders to display for this patient.  Labs from 04/13/2020: CBC was normal other than MCHC 30.6, & RDW 17.2. CMP was normal other than glucose of 120. Hemoglobin A1C was abnormal at 6.1%. T4 was abnormal at 17.55. Vitamin B12 was abnormal at 178.  CBC Latest Ref Rng & Units 02/03/2020 11/09/2019 06/01/2019  WBC 3.4 - 10.8 x10E3/uL 8.2 7.4 6.2  Hemoglobin 13.0 - 17.7 g/dL 08/01/2019 53.9 76.7  Hematocrit 37.5 - 51.0 % 46.3 49.0 43.5  Platelets 150 - 450 x10E3/uL 176 232 218   CMP Latest Ref Rng & Units 02/03/2020 11/09/2019 06/01/2019  Glucose 70 - 99 mg/dL 08/01/2019) 937(T) 024(O)  BUN 8 - 23 mg/dL 11 13 11   Creatinine 0.61 - 1.24 mg/dL 973(Z ) 3.29  Sodium 135 - 145 mmol/L 138 139 138  Potassium 3.5 - 5.1 mmol/L 3.5 3.9 4.2  Chloride 98 - 111 mmol/L 106 101 104  CO2 22 - 32 mmol/L 22 27 26   Calcium 8.9 - 10.3 mg/dL 9.24(Q) 6.83 9.0  Total Protein 6.5 - 8.1 g/dL 8.0 ) 7.6  Total Bilirubin 0.3 - 1.2 mg/dL 0.9 0.9 0.7  Alkaline Phos 38 - 126 U/L 46 39 45  AST 15 - 41 U/L 30 28 39  ALT 0 - 44 U/L 23 25 33    Lab Results  Component Value Date   TSH 0.532 10/16/2014   Lab Results  Component Value Date   ALBUMIN 4.4 02/03/2020   ANIONGAP 10 02/03/2020   Lab Results  Component Value Date   CHOL 124 10/16/2014   HDL 35 (L) 10/16/2014   LDLCALC 57 10/16/2014   Lab Results  Component Value Date   TRIG 158 10/16/2014   No results found for: HGBA1C    ASSESSMENT & PLAN:   Problem List Items Addressed This Visit      Cardiovascular and Mediastinum   Syncope - Primary    Patient feels dizzy and lightheaded while getting up.  I asked him to stop using lisinopril.  His pacemaker has also not been checked on the last  3 yrs.  So we made an appointment for him to  regularly check pacemaker every 3 months.      Relevant Medications   tadalafil (CIALIS) 5 MG tablet   lisinopril (ZESTRIL) 10 MG tablet     Nervous and Auditory   Peripheral neuropathy    Patient B12 level was low on review.  If he is not taking B12 he should be started on that.      Relevant Medications   clonazePAM (KLONOPIN) 0.5 MG tablet   cyclobenzaprine (FLEXERIL) 5 MG tablet   tiZANidine (ZANAFLEX) 4 MG capsule     Other   HIV (human immunodeficiency virus infection) ([facility]) (Chronic)    Patient gets his treatment from the infectious diseases specialist on a regular basis      Pacemaker    Needs a regular every 3 months follow-up for his pacemaker.       Other Visit Diagnoses    Chest pain, unspecified type       Relevant Orders   EKG 12-Lead      No orders of the defined types were placed in this encounter.   Follow-up: Return in about 3 months (around 08/03/2020) for [facility] Check.  patient Came to the office with dizziness.  Has been taking lisinopril and I asked him to stop that completely.  His pacemaker has not been checked in the last 3 years so we scheduled  and made him a regular appointment every 3 months for his pacemaker to be checked.  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  So I entered supplement his B12 level and the B12 level was low.  Benefit from a referral from neurology because of the neuropathy.  He is being followed up by endocrine for the thyroid disorder.  Taking vitamin D replacement for the pain in the back.  Seems to be under control on the present medication. Advised to check a cortisol level also  /EKG revealed normal sinus rhythm without any acute changes.  Dr. Woodroe Chen [facility] [address] [place], [place] [address]   By signing my name below,  I, [employer], attest that this documentation has been prepared under the direction and in the presence of Corky Downs, MD. Electronically Signed: Corky Downs, MD 05/03/20, 1:32 PM   I personally performed the services described in this documentation, which was SCRIBED in my presence. The recorded information has been reviewed and considered accurate. It has been edited as necessary during review. Corky Downs, MD

## 2020-05-03 NOTE — Assessment & Plan Note (Signed)
Patient feels dizzy and lightheaded while getting up.  I asked him to stop using lisinopril.  His pacemaker has also not been checked on the last  3 yrs.  So we made an appointment for him to regularly check pacemaker every 3 months.

## 2020-05-03 NOTE — Assessment & Plan Note (Signed)
Needs a regular every 3 months follow-up for his pacemaker.

## 2020-05-03 NOTE — Patient Instructions (Addendum)
Please stop taking Lisinopril (Zestril) 10 mg as directed by Dr. Juel Burrow.   Please compare the medications on your check out sheet today with the medications you are taking at home. When you return, please report back what medications you are and are not taking.   Please come in for a routine pacemaker check every three months.

## 2020-05-03 NOTE — Assessment & Plan Note (Signed)
Patient B12 level was low on review.  If he is not taking B12 he should be started on that.

## 2020-05-03 NOTE — Assessment & Plan Note (Signed)
Patient gets his treatment from the infectious diseases specialist on a regular basis

## 2020-05-23 MED FILL — TRIUMEQ 600-50-300 MG TABS: 600-50-300 | 30 days supply | Qty: 30 | Fill #4

## 2020-07-08 MED FILL — TRIUMEQ 600-50-300 MG TABS: 600-50-300 | 30 days supply | Qty: 30 | Fill #5

## 2020-07-27 ENCOUNTER — Other Ambulatory Visit: Payer: Self-pay

## 2020-07-27 ENCOUNTER — Ambulatory Visit (INDEPENDENT_AMBULATORY_CARE_PROVIDER_SITE_OTHER): Payer: Medicare Other | Admitting: Urology

## 2020-07-27 ENCOUNTER — Encounter: Payer: Self-pay | Admitting: Urology

## 2020-07-27 VITALS — BP 175/103 | HR 83 | Ht 65.0 in | Wt 163.0 lb

## 2020-07-27 DIAGNOSIS — R310 Gross hematuria: Secondary | ICD-10-CM

## 2020-07-27 DIAGNOSIS — N390 Urinary tract infection, site not specified: Secondary | ICD-10-CM | POA: Diagnosis not present

## 2020-07-27 MED ORDER — NYSTATIN-TRIAMCINOLONE 100000-0.1 UNIT/GM-% EX OINT
1.0000 | TOPICAL_OINTMENT | Freq: Two times a day (BID) | CUTANEOUS | 0 refills | Status: DC
Start: 2020-07-27 — End: 2020-12-14

## 2020-07-27 NOTE — Patient Instructions (Signed)
Cistoscopia  Cystoscopy  La cistoscopia es un procedimiento que se utiliza para ayudar a diagnosticar y, a veces, tratar afecciones que afectan las vías urinarias inferiores. Las vías urinarias inferiores incluyen la vejiga y la uretra. La uretra es el conducto por el que drena la orina de la vejiga. La cistoscopia se hace con un instrumento fino en forma de tubo con una luz y una cámara en el extremo (cistoscopio). El cistoscopio puede ser duro o flexible, según el objetivo del procedimiento. El cistoscopio se inserta por la uretra e ingresa a la vejiga.  La cistoscopia se puede recomendar en los siguientes casos:  · Infecciones de las vías urinarias que se repiten.  · Sangre en la orina (hematuria).  · Incapacidad para controlar la orina (incontinencia urinaria) o vejiga hiperactiva.  · Células inusuales que se encuentran en una muestra de orina.  · Una obstrucción en la uretra, como un cálculo urinario.  · Dolor al orinar.  · Una anormalidad en la vejiga que se encuentra durante una pielografía intravenosa (PIV) o exploración por tomografía computarizada (TC).  La cistoscopia también puede realizarse para extraer una muestra de tejido para examinarla con un microscopio (biopsia).  Informe al médico acerca de lo siguiente:  · Cualquier alergia que tenga.  · Todos los medicamentos que utiliza, incluidos vitaminas, hierbas, gotas oftálmicas, cremas y medicamentos de venta libre.  · Cualquier problema previo que usted o algún miembro de su familia hayan tenido con los anestésicos.  · Cualquier trastorno de la sangre que tenga.  · Cirugías a las que se haya sometido.  · Cualquier afección médica que tenga.  · Si está embarazada o podría estarlo.  ¿Cuáles son los riesgos?  En general, se trata de un procedimiento seguro. Sin embargo, pueden ocurrir complicaciones, por ejemplo:  · Infección.  · Sangrado.  · Reacciones alérgicas a los medicamentos.  · Daños a otras estructuras u órganos.  ¿Qué ocurre antes del  procedimiento?  · Consulte al médico sobre:  ? Cambiar o suspender los medicamentos que toma habitualmente. Esto es muy importante si toma medicamentos para la diabetes o anticoagulantes.  ? Tomar medicamentos como aspirina e ibuprofeno. Estos medicamentos pueden tener un efecto anticoagulante en la sangre. No tome estos medicamentos a menos que el médico se lo indique.  ? Tomar medicamentos de venta libre, vitaminas, hierbas y suplementos.  · Siga las instrucciones del médico respecto de las restricciones en las comidas o las bebidas.  · Pregúntele al médico qué medidas se tomarán para ayudar a prevenir una infección. Estas medidas pueden incluir:  ? Lavar la piel con un jabón antiséptico.  ? Suministrar antibióticos.  · Pueden hacerle un examen o estudios, tales como:  ? Radiografías de la vejiga, la uretra o los riñones.  ? Análisis de orina para verificar si hay signos de infección.  · Haga que alguien lo lleve a su casa desde el hospital o la clínica.  ¿Qué ocurre durante el procedimiento?       · Le administrarán uno o más de los siguientes medicamentos:  ? Un medicamento para ayudar a relajarse (sedante).  ? Un medicamento para adormecer la zona (anestesia local).  · Se limpiará la zona que se encuentra alrededor de la abertura de la uretra.  · El cistoscopio se introducirá por la uretra e ingresará a la vejiga.  · Un líquido estéril fluirá por el cistoscopio y rellenará la vejiga. El líquido estirará la vejiga para que el médico pueda examinar claramente las paredes de la   vejiga.  · El médico examinará la uretra y la vejiga. El médico puede tomar una biopsia o extraer cálculos.  · El cistoscopio se retirará y la vejiga se vaciará.  El procedimiento puede variar según el médico y el hospital.  ¿Qué puedo esperar después del procedimiento?  Después del procedimiento, es común tener los siguientes síntomas:  · Algo de dolor o molestias en el abdomen y la uretra.  · Síntomas urinarios. Estos incluyen los  siguientes:  ? Dolor o ardor leves al orinar. El dolor debe ceder unos minutos después de orinar. Esto puede durar 1 semana.  ? Una pequeña cantidad de sangre en la orina durante varios días.  ? Sentir que necesita orinar pero produce solo una pequeña cantidad de orina.  Siga estas instrucciones en su casa:  Medicamentos  · Tome los medicamentos de venta libre y los recetados solamente como se lo haya indicado el médico.  · Si le recetaron un antibiótico, tómelo como se lo haya indicado el médico. No deje de tomar el antibiótico, aunque comience a sentirse mejor.  Instrucciones generales  · Retome sus actividades normales según lo indicado por el médico. Pregúntele al médico qué actividades son seguras para usted.  · No conduzca durante 24 horas si le administraron un sedante durante el procedimiento.  · Observe si hay sangre en la orina. Si la cantidad de sangre en la orina aumenta, comuníquese con el médico.  · Siga las instrucciones del médico respecto de las restricciones en las comidas o las bebidas.  · Si se tomó una muestra de tejido para análisis (biopsia) durante el procedimiento, depende de usted obtener los resultados de la prueba. Consulte al médico o pregunte en el departamento donde se realiza la prueba cuándo estarán listos los resultados.  · Beba suficiente líquido como para mantener la orina de color amarillo pálido.  · Concurra a todas las visitas de seguimiento como se lo haya indicado el médico. Esto es importante.  Comuníquese con un médico si:  · Tiene un dolor que empeora o que no mejora con los medicamentos, especialmente al orinar.  · Tiene problemas para orinar.  · Observa más sangre en la orina.  Solicite ayuda inmediatamente si:  · Hay coágulos de sangre en la orina.  · Tiene dolor abdominal.  · Tiene fiebre o escalofríos.  · No puede orinar.  Resumen  · La cistoscopia es un procedimiento que se utiliza para ayudar a diagnosticar y, a veces, tratar afecciones que afectan las vías  urinarias inferiores.  · La cistoscopia se hace con un instrumento fino en forma de tubo con una luz y una cámara en el extremo.  · Después del procedimiento, es común tener algo de sensibilidad o dolor en el abdomen y la uretra.  · Observe si hay sangre en la orina. Si la cantidad de sangre en la orina aumenta, comuníquese con el médico.  · Si le recetaron un antibiótico, tómelo como se lo haya indicado el médico. No deje de tomar el antibiótico, aunque comience a sentirse mejor.  Esta información no tiene como fin reemplazar el consejo del médico. Asegúrese de hacerle al médico cualquier pregunta que tenga.  Document Revised: 11/04/2018 Document Reviewed: 11/04/2018  Elsevier Patient Education © 2020 [employer] Inc[employer]

## 2020-07-27 NOTE — Progress Notes (Signed)
07/27/20 4:53 PM   Brian [name] [birth year] [id number]  CC: Gross hematuria, recurrent UTI  HPI: I saw Brian [name] in urology clinic today for evaluation of the above issues.  Today's history was obtained with the help of a family member, as well as interpreter Maryjane Hurter.  Even with the interpreter, it was very challenging to obtain a history.  He is a 79 year old male originally [place] with HIV who reports 1 episode of gross hematuria 2 weeks ago with clots.  He also reportedly has a history of recurrent urinary tract infections over the last year, however none of those records are urine cultures are available to me.  He is reportedly been treated with multiple different courses of antibiotics for UTI, and is currently finishing a course of nitrofurantoin.  It sounds like he underwent a urologic procedure originally 1997 where a " ring" was placed in the urethra.  It sounds like he underwent multiple follow-up surgeries for recurrent strictures.  I am not sure what his original procedure was, but this may have been a urethral stent or a TURP.  It sounds like the recurrent procedures were either DVIU's or incisions of a bladder neck contracture.  Again, none of those records are available to me.  He really denies any bothersome urinary symptoms, and denies any incontinence or weak stream.  He denies any episodes of urinary retention.  His only complaint is some mild pelvic pain.   Urinalysis is completely benign today with 0-5 WBCs, 0-2 RBCs, no bacteria, nitrite negative, trace leukocytes.   PMH: Past Medical History:  Diagnosis Date  . Arthritis   . Asthma   . Bradycardia   . Depression   . HIV (human immunodeficiency virus infection) ([facility])   . Hyperlipidemia     Surgical History: Past Surgical History:  Procedure Laterality Date  . cataracts    . EYE SURGERY    . PACEMAKER INSERTION N/A 08/02/2015   Procedure: INSERTION PACEMAKER;  Surgeon: Corky Downs, MD;  Location: [facility]  ORS;  Service: Cardiovascular;  Laterality: N/A;    Family History: Family History  Problem Relation Age of Onset  . Hypertension Other     Social History:  reports that he has never smoked. He has never used smokeless tobacco. He reports current alcohol use of about 2.0 standard drinks of alcohol per week. He reports that he does not use drugs.  Physical Exam: BP (!) 175/103   Pulse 83   Ht 5\' 5"  (1.651 m)   Wt 163 lb (73.9 kg)   BMI 27.12 kg/m    Constitutional:  Alert and oriented, No acute distress. Cardiovascular: No clubbing, cyanosis, or edema. Respiratory: Normal respiratory effort, no increased work of breathing. GI: Abdomen is soft, nontender, nondistended, no abdominal masses GU: On exam, uncircumcised phallus with significant balanitis and phimosis  Laboratory Data: Reviewed, see HPI  Pertinent Imaging: None to review  Assessment & Plan:   In summary, 79 year old male with HIV who presents with recurrent UTIs and gross hematuria of unclear etiology, and apparently has had multiple urethral procedures in the past, unclear what these were.  On exam, he clearly has concern for balanitis, and I recommended a course of Mycolog cream.  I also recommended further evaluation for his gross hematuria and reported UTIs with CT urogram and cystoscopy, and cystoscopy will also evaluate for any urethral stricture or prostate/bladder pathology with his history of multiple urologic surgeries in the past.  We discussed common possible etiologies of hematuria  including BPH, malignancy, urolithiasis, medical renal disease, and idiopathic. Standard workup recommended by the AUA includes imaging with CT urogram to assess the upper tracts, and cystoscopy. Cytology is performed on patient's with gross hematuria to look for malignant cells in the urine.  CT urogram and cystoscopy for hematuria/UTI workup Mycolog cream BID x2 weeks for balanaitis   Legrand Rams,  MD 07/27/2020  [facility] [address] [place], [place] [address] [phone]

## 2020-07-28 LAB — MICROSCOPIC EXAMINATION
Bacteria, UA: NONE SEEN
Epithelial Cells (non renal): NONE SEEN /HPF (ref 0–10)

## 2020-07-28 LAB — URINALYSIS, COMPLETE
Bilirubin, UA: NEGATIVE
Glucose, UA: NEGATIVE
Ketones, UA: NEGATIVE
Nitrite, UA: NEGATIVE
Protein,UA: NEGATIVE
Specific Gravity, UA: <1.005 — ABNORMAL LOW (ref 1.005–1.030)
Urobilinogen, Ur: 0.2 mg/dL (ref 0.2–1.0)
pH, UA: 5.5 (ref 5.0–7.5)

## 2020-08-03 ENCOUNTER — Other Ambulatory Visit (HOSPITAL_COMMUNITY): Payer: Self-pay | Admitting: Infectious Diseases

## 2020-08-07 MED FILL — TRIUMEQ 600-50-300 MG TABS: 600-50-300 | 30 days supply | Qty: 30 | Fill #0

## 2020-08-08 ENCOUNTER — Ambulatory Visit: Payer: Medicare Other | Admitting: Infectious Diseases

## 2020-08-10 ENCOUNTER — Ambulatory Visit: Payer: Medicare Other | Attending: Infectious Diseases | Admitting: Infectious Diseases

## 2020-08-10 ENCOUNTER — Ambulatory Visit: Admission: RE | Admit: 2020-08-10 | Payer: Medicare Other | Source: Ambulatory Visit

## 2020-08-10 ENCOUNTER — Other Ambulatory Visit
Admission: RE | Admit: 2020-08-10 | Discharge: 2020-08-10 | Disposition: A | Discharge status: Home / Self Care | Payer: Medicare Other | Source: Ambulatory Visit | Attending: Urology | Admitting: Urology

## 2020-08-10 ENCOUNTER — Encounter: Payer: Self-pay | Admitting: Infectious Diseases

## 2020-08-10 VITALS — BP 138/91 | HR 96 | Temp 98.4°F | Resp 16 | Ht 65.0 in | Wt 163.0 lb

## 2020-08-10 DIAGNOSIS — Z79899 Other long term (current) drug therapy: Secondary | ICD-10-CM | POA: Insufficient documentation

## 2020-08-10 DIAGNOSIS — Z21 Asymptomatic human immunodeficiency virus [HIV] infection status: Secondary | ICD-10-CM | POA: Insufficient documentation

## 2020-08-10 DIAGNOSIS — I10 Essential (primary) hypertension: Secondary | ICD-10-CM | POA: Diagnosis not present

## 2020-08-10 DIAGNOSIS — M810 Age-related osteoporosis without current pathological fracture: Secondary | ICD-10-CM | POA: Insufficient documentation

## 2020-08-10 DIAGNOSIS — B20 Human immunodeficiency virus [HIV] disease: Secondary | ICD-10-CM | POA: Insufficient documentation

## 2020-08-10 DIAGNOSIS — Z950 Presence of cardiac pacemaker: Secondary | ICD-10-CM | POA: Insufficient documentation

## 2020-08-10 DIAGNOSIS — E785 Hyperlipidemia, unspecified: Secondary | ICD-10-CM | POA: Diagnosis not present

## 2020-08-10 DIAGNOSIS — Z9114 Patient's other noncompliance with medication regimen: Secondary | ICD-10-CM | POA: Diagnosis not present

## 2020-08-10 DIAGNOSIS — Z603 Acculturation difficulty: Secondary | ICD-10-CM | POA: Insufficient documentation

## 2020-08-10 DIAGNOSIS — M1990 Unspecified osteoarthritis, unspecified site: Secondary | ICD-10-CM | POA: Diagnosis not present

## 2020-08-10 DIAGNOSIS — Z5989 Other problems related to housing and economic circumstances: Secondary | ICD-10-CM | POA: Insufficient documentation

## 2020-08-10 DIAGNOSIS — Z602 Problems related to living alone: Secondary | ICD-10-CM | POA: Diagnosis not present

## 2020-08-10 LAB — COMPREHENSIVE METABOLIC PANEL
ALT: 28 U/L (ref 0–44)
AST: 37 U/L (ref 15–41)
Albumin: 4.1 g/dL (ref 3.5–5.0)
Alkaline Phosphatase: 40 U/L (ref 38–126)
Anion gap: 12 (ref 5–15)
BUN: 11 mg/dL (ref 8–23)
CO2: 20 mmol/L — ABNORMAL LOW (ref 22–32)
Calcium: 8.8 mg/dL — ABNORMAL LOW (ref 8.9–10.3)
Chloride: 104 mmol/L (ref 98–111)
Creatinine, Ser: 1.15 mg/dL (ref 0.61–1.24)
GFR, Estimated: >60 mL/min (ref >60)
Glucose, Bld: 118 mg/dL — ABNORMAL HIGH (ref 70–99)
Potassium: 3.2 mmol/L — ABNORMAL LOW (ref 3.5–5.1)
Sodium: 136 mmol/L (ref 135–145)
Total Bilirubin: 0.7 mg/dL (ref 0.3–1.2)
Total Protein: 7.8 g/dL (ref 6.5–8.1)

## 2020-08-10 NOTE — Progress Notes (Signed)
NAME: Brian [name]  DOB: [birth year]  MRN: [id number]  Date/Time: 08/10/2020 10:47 AM Subjective:  Patient here for follow-up of HIV   Brian [name] ( [place] interpreter) is here for the whole visit  Brian [name] is a 79 y.o. 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  Since I last saw him in April he had a syncopal episode and fell-hurt his ring finger rt had- says it was fractured. He saw the cardiologist on 7/7  who stopped lisinopril and  asked to interrogate  his pacemaker which had not been done in 3 years but patient missed the appt. He first said he did not know about it and later told that he was in [place] with his family  During that time in August 2021  He also had frank hematuria and saw Dr.Sninsky urologist on 07/27/20 and is scheduled on 08/24/20 for cystoscopy Pt comes to the visit by himself and even with Spanish interpreter he does not follow many things we discuss . He relies on his son/duaghter in law to help with his visits but they dont usually come to appts to see me. He confused the time of his visit on Tuesday - instead of 10.30am to see me he was here at 1.30Pm and was rescheduled by ht estaff  Pt says he missed 6 days of meds because of delay in getting the refill. I called [facility] pharmacy and came to know they had called him thrice and also informed his son. He is on triumeq   HIV history Diagnosed in 2014 when he found out that his wife in [place] had died of AIDS. He is originally from there but has been in [place] for the past 31 years HIV diagnosed 2014 1st visit to [facility] 11/25/13 Nadir cd4 was 120 (5%) on 10/29/2013 VL 273,240 from 10/29/2013  HAARt history 1st regimen was complera 2nd regimen triumeq As per Dr.Fitzgerald's note on 08/06/16  Recommendations HIV -he has had some issues with compliance with Complera in the past. His viral load has been slightly elevated  now for several checks.  I would like to consider switching him to another regimen potentially more potent given the concern for developing resistance. He had pan sensitive virus on genotype in 2015. WIll check HLA [id number] and if neg start triumeq   Telephone Encounter - [name], MD - 08/29/2016 11:35 AM EDT please let pt know that his testing we did to change his medicine to a stronger one was good  I have sent in a new medicine triumeq to take in place of the Complera. He should stop the complera once he gets the triumeq. He should call if any new problems - otherwise I will see him in Jan   Electronically signed by [name], MD at 08/29/2016 11:39 AM EDT   Acquired thru- heterosexual contact Genotype highly sensitive organism  ? Past Medical History:  Diagnosis Date  . Arthritis   . Asthma   . Bradycardia   . Depression   . HIV (human immunodeficiency virus infection) ([place])   . Hyperlipidemia   Osteoporosis   Past Surgical History:  Procedure Laterality Date  . cataracts    . EYE SURGERY    . PACEMAKER INSERTION N/A 08/02/2015   Procedure: INSERTION PACEMAKER;  Surgeon: Cletis Athens, MD;  Location: [name];  Service: Cardiovascular;  Laterality: N/A;  prosthesis for urinary flow   SH Lives on his  own 6 children and many grandchildren Non smoker No alcohol or illicit drug use   Family History  Problem Relation Age of Onset  . Hypertension Other    No Known Allergies    ?Current outpatient medications reviewed  Tramadol -acetaminophen Ventolin HFA Clonazepam 0.5 mg bid Celecoxib  Azelastine tamsulosin Omeprazole Dutasteride Paroxetine pregabalin Duloxetine triumeq atorvastatin Alendronate 3m once a week  REVIEW OF SYSTEMS:  Const: negative fever, negative chills, negative weight loss Eyes: negative diplopia or visual changes, negative eye pain ENT: negative coryza, negative sore throat Resp: negative cough,  hemoptysis, dyspnea Cards: negative for chest pain, palpitations, lower extremity edema GU: negative for frequency, dysuria and hematuria Skin: negative for rash and pruritus Heme: negative for easy bruising and gum/nose bleeding MS: negative for myalgias, arthralgias, back pain and muscle weakness Neurolo: no headache or dizziness Psych: negative for feelings of anxiety, depression   Objective:  VITALS:  BP 136/91, HR 96, Pulse ox 96, Temp 98.4   PHYSICAL EXAM:  General: Alert, cooperative, no distress, appears stated age.  Head: Normocephalic, without obvious abnormality, atraumatic. Eyes: Conjunctivae clear, anicteric sclerae. Pupils are equal Nose: Nares normal. No drainage or sinus tenderness. Throat: Lips, mucosa, and tongue normal. No Thrush Neck: Supple, symmetrical, no adenopathy, thyroid: non tender no carotid bruit and no JVD. Back: No CVA tenderness. Lungs: Clear to auscultation bilaterally. No Wheezing or Rhonchi. No rales. Heart: Regular rate and rhythm, no murmur, rub or gallop.pacemaker site fine Abdomen: Soft, non-tender,not distended. Bowel sounds normal. No masses Extremities: tape over rt ring finger Skin: No rashes or lesions. Not Jaundiced Lymph: Cervical, supraclavicular normal. Neurologic: Grossly non-focal   Health maintenance Vaccination   Vaccine Date last given comment  Influenza    Hepatitis B    Hepatitis A    Prevnar-PCV-13 12/01/18   Pneumovac-PPSV-23 2016   TdaP 12/01/18   HPV    Shingrix ( zoster vaccine)     ______________________  Labs Lab Result  Date comment  HIV VL 330 02/03/20   CD4 259 (25%) 02/03/20   Genotype Sensitive virus 10/29/2013 Genosure prime  [id number] 08/06/16   HIV antibody     RPR NR 02/03/20   Quantiferon Gold neg 02/03/20   Hep C ab NR 02/03/20   Hepatitis B-ab,ag,c     Hepatitis A-IgM, IgG /T     Lipid     GC/CHL          HB,PLT,Cr, LFT 14.5/218/0.99 06/01/19     Preventive  Procedure Result  Date  comment  colonoscopy          Dental exam     Opthal        Impression/Recommendation ?HIV /AIDS- on Triumeq  Had missed for 6 days Says he is taking it regularly now Last vl 330 and cd4 is 2955  HTN- controlled  Recent syncope- followed by cardiology- I made another appt for him for Nov 4th at 10.30am  Hematuria- one episode- followed by Dr.Sninsky- has an appt for cystoscopy on 08/24/20  Hyperlipidemia on atorvastatin  Multiple factors that is being hindrance to medical care A) Pt not able to understand the instructions for appts and not keeping them inspite of on site- spanish interpreter assisting him  Pt not answering calls from pharmacy for refill delvery - because he says he has no connection Pt says his son, daughter-in -law manages his clinical visits but he does not keep the appts or wrong time  Pt 's family need to accompany him  to visits  Today I spent nearly 45 minutes trying to cocrdinate his care with the various consultants, pharmacy. Tried to call his daughter Pandora Leiter but could not reach them and voice mail full.  Pt will do labs todays and follow up 3 months- Brian, [place] interpreter was in the visit the whole time and accompanied him for lab work

## 2020-08-10 NOTE — Patient Instructions (Addendum)
You are here for follow of HIV Today you will do labs You missed a pacemaker check  visit set up in Aug - You were in [place] during that visit I have called the pacemaker clinic and another appt has been made for pacemaker check up  On 08/31/20 at 10.30AM [facility]  Address [address] [phone]

## 2020-08-11 LAB — T-HELPER CELLS CD4/CD8 %
% CD 4 Pos. Lymph.: 29.9 % — ABNORMAL LOW (ref 30.8–58.5)
Absolute CD 4 Helper: 329 /uL — ABNORMAL LOW (ref 359–1519)
Basophils Absolute: 0 10*3/uL (ref 0.0–0.2)
Basos: 1 %
CD3+CD4+ Cells/CD3+CD8+ Cells Bld: 0.95 (ref 0.92–3.72)
CD3+CD8+ Cells # Bld: 347 /uL (ref 109–897)
CD3+CD8+ Cells NFr Bld: 31.5 % (ref 12.0–35.5)
EOS (ABSOLUTE): 0.1 10*3/uL (ref 0.0–0.4)
Eos: 2 %
Hematocrit: 45.4 % (ref 37.5–51.0)
Hemoglobin: 15.2 g/dL (ref 13.0–17.7)
Immature Grans (Abs): 0 10*3/uL (ref 0.0–0.1)
Immature Granulocytes: 1 %
Lymphocytes Absolute: 1.1 10*3/uL (ref 0.7–3.1)
Lymphs: 17 %
MCH: 29.1 pg (ref 26.6–33.0)
MCHC: 33.5 g/dL (ref 31.5–35.7)
MCV: 87 fL (ref 79–97)
Monocytes Absolute: 0.5 10*3/uL (ref 0.1–0.9)
Monocytes: 8 %
Neutrophils Absolute: 4.8 10*3/uL (ref 1.4–7.0)
Neutrophils: 71 %
Platelets: 134 10*3/uL — ABNORMAL LOW (ref 150–450)
RBC: 5.22 x10E6/uL (ref 4.14–5.80)
RDW: 14.3 % (ref 11.6–15.4)
WBC: 6.6 10*3/uL (ref 3.4–10.8)

## 2020-08-11 LAB — HIV-1 RNA QUANT-NO REFLEX-BLD
HIV 1 RNA Quant: 160 copies/mL
LOG10 HIV-1 RNA: 2.204 log10copy/mL

## 2020-08-24 ENCOUNTER — Ambulatory Visit (INDEPENDENT_AMBULATORY_CARE_PROVIDER_SITE_OTHER): Payer: Medicare Other | Admitting: Urology

## 2020-08-24 ENCOUNTER — Encounter: Payer: Self-pay | Admitting: Urology

## 2020-08-24 ENCOUNTER — Other Ambulatory Visit: Payer: Self-pay | Admitting: Urology

## 2020-08-24 ENCOUNTER — Other Ambulatory Visit: Payer: Self-pay

## 2020-08-24 VITALS — BP 170/95 | HR 91 | Ht 67.0 in | Wt 163.0 lb

## 2020-08-24 DIAGNOSIS — N489 Disorder of penis, unspecified: Secondary | ICD-10-CM | POA: Diagnosis not present

## 2020-08-24 DIAGNOSIS — N35913 Unspecified membranous urethral stricture, male: Secondary | ICD-10-CM

## 2020-08-24 DIAGNOSIS — R310 Gross hematuria: Secondary | ICD-10-CM | POA: Diagnosis not present

## 2020-08-24 NOTE — Patient Instructions (Signed)
Reseccin transuretral de tumor de vejiga Transurethral Resection of Bladder Tumor  La reseccin transuretral de un tumor de vejiga es la extraccin (reseccin) de un crecimiento canceroso (tumor) en la pared interna de la vejiga. La vejiga es el rgano que contiene la [place]. El tumor se extrae a travs del conducto por donde se elimina la orina (uretra). En una reseccin transuretral, se pasa un telescopio delgado con Nettie Elm, una cmara pequea y un borde cortante elctrico (resectoscopio) a travs de Engineer, mining. En los hombres, la abertura de la uretra se encuentra en el extremo del pene. En las mujeres, justo por encima de la abertura vaginal. Informe al mdico acerca de lo siguiente:  Cualquier alergia que tenga.  Todos los [employer], incluidos vitaminas, hierbas, gotas oftlmicas, cremas y [address] de [address].  Cualquier problema previo que usted o algn miembro de su familia haya tenido con los anestsicos.  Cualquier trastorno de la sangre que tenga.  Cirugas a las que se haya sometido.  Cualquier afeccin mdica que tenga.  Cualquier infeccin de las vas urinarias reciente que haya tenido.  Si est embarazada o podra estarlo. Cules son los riesgos? En general, se trata de un procedimiento seguro. Sin embargo, pueden ocurrir complicaciones, por ejemplo:  Infeccin.  Sangrado.  Reacciones alrgicas a los medicamentos.  Dao a las estructuras u rganos cercanos como los siguientes: ? La uretra. ? Los conductos que drenan orina desde los riones hasta la vejiga (urteres).  Sensacin de dolor y ardor al Geographical information systems officer.  Dificultad para orinar debido a la obstruccin parcial de Engineer, mining.  Imposibilidad de orinar (retencin urinaria). Qu ocurre antes del procedimiento? Hidratacin Siga las indicaciones del mdico acerca de mantenerse hidratado, las cuales pueden incluir lo siguiente:  Hasta 2horas antes del procedimiento, puede beber lquidos  transparentes, como agua, jugos de fruta sin pulpa, caf negro y t solo.  Restricciones en las comidas y bebidas Siga las indicaciones del mdico respecto de las comidas y bebidas, las cuales pueden incluir lo siguiente:  8 horas antes del procedimiento, no coma alimentos pesados, por ejemplo, carnes, alimentos con alto contenido graso o fritos.  6 horas antes del procedimiento, deje de ingerir comidas o alimentos livianos, como tostadas o cereales.  6 horas antes del procedimiento, deje de tomar [address] o bebidas que [employer].  2 horas antes del procedimiento, deje de beber lquidos transparentes. Medicamentos Consulte al mdico sobre:  Multimedia programmer o suspender los medicamentos que toma habitualmente. Esto es muy importante si toma medicamentos para la diabetes o anticoagulantes.  Tomar medicamentos como aspirina e ibuprofeno. Estos medicamentos pueden tener un efecto anticoagulante en la [place]. No tome estos medicamentos a menos que el mdico se lo indique.  Tomar medicamentos de [employer], vitaminas, hierbas y suplementos. Estudios Posiblemente deba realizarse exmenes o pruebas que incluyen:  Un examen fsico.  Anlisis de sangre.  Anlisis de [place].  Electrocardiograma (ECG). Este estudio mide la actividad elctrica del corazn. Indicaciones generales  Haga que alguien lo lleve a su casa desde el hospital o la clnica.  Pregntele al mdico cmo se marcar o Audiological scientist de la Leisure centre manager.  Pregntele al mdico qu medidas se tomarn para ayudar a prevenir una infeccin. Estas medidas pueden incluir: ? Lavar la piel con un jabn antisptico. ? Tomar antibiticos. Qu ocurre durante el procedimiento?  Le colocarn una va intravenosa en una de las venas.  Le administrarn uno o ms de los siguientes medicamentos: ? Un medicamento para ayudar a relajarse (  sedante). ? Un medicamento que lo har dormir (anestesia general). ? Un medicamento que se inyecta en la  columna vertebral para adormecer la zona que est por debajo y apenas por encima del sitio de la inyeccin (anestesia raqudea).  Se le colocarn las piernas en los descansos para pies (estribos) para mantenerlas separadas y las rodillas, [address].  El resectoscopio se pasar a travs de la uretra hasta la vejiga.  La parte de la vejiga que est afectada por el tumor se extraer con el borde cortante del resectoscopio.  Se quitar el resectoscopio.  Se introducir un tubo flexible y delgado (catter) a travs de la uretra, hasta la vejiga. El catter drenar la orina dentro de una bolsa que se encuentra fuera del cuerpo. ? Es posible que se pase lquido a travs del catter para [employer]	[place]. El procedimiento puede variar segn el mdico y el hospital. Ladell Heads ocurre despus del procedimiento?  Le controlarn la presin arterial, la frecuencia cardaca, la frecuencia respiratoria y Air cabin crew de oxgeno en la sangre hasta que le den el alta del hospital o la clnica.  Puede seguir recibiendo lquidos o medicamentos por va intravenosa.  Sentir un poco de Engineer, mining. Le administrarn analgsicos para Engineer, materials.  Tendr un catter que drenar la [place]. ? Tendr Montez Hageman en la [place]. Se le puede dejar colocado el catter hasta que la orina sea transparente. ? Se controlar la cantidad de [place]. Si es necesario, la vejiga se Agricultural engineer) introduciendo un lquido Patent attorney.  Lo alentarn a caminar lo ms pronto posible.  Quizs [employer] de compresin. Estas medias ayudan a Transport planner formacin de cogulos de [place] y a reducir la hinchazn de las piernas.  No conduzca durante 24horas si le administraron un sedante durante el procedimiento. Resumen  La reseccin transuretral de un tumor de vejiga es la extraccin (reseccin) de un crecimiento canceroso (tumor) en la pared interna de la vejiga.  Para realizar este procedimiento, el mdico utiliza un telescopio delgado  con una luz, Posey Boyer, y un borde cortante elctrico (resectoscopio).  Siga las indicaciones de su mdico. Es posible que tenga que suspender o cambiar ciertos medicamentos y dejar de comer y beber varias horas antes del procedimiento.  Le controlarn la presin arterial, la frecuencia cardaca, la frecuencia respiratoria y Air cabin crew de oxgeno en la sangre hasta que le den el alta del hospital o la clnica.  Quizs [employer] de compresin. Estas medias ayudan a Transport planner formacin de cogulos de [place] y a reducir la hinchazn de las piernas. Esta informacin no tiene Theme park manager el consejo del mdico. Asegrese de hacerle al mdico cualquier pregunta que tenga. Document Revised: 06/18/2018 Document Reviewed: 06/18/2018 Elsevier Patient Education  2020 [employer].

## 2020-08-24 NOTE — Progress Notes (Signed)
YY#[id number]

## 2020-08-24 NOTE — Progress Notes (Signed)
Cystoscopy Procedure Note:  Indication: Gross hematuria, recurrent UTIs  Briefly, 79 year old Spanish-speaking male with HIV who has had multiple episodes of gross hematuria, as well as multiple UTIs.  He reportedly has had numerous urologic surgeries before and urethral stricture disease, but none of those records are available to me.  After informed consent and discussion of the procedure and its risks, Brian [name] was positioned and prepped in the standard fashion.  On exam, he had an uncircumcised phallus that was grossly abnormal with concern for possible lesions within the foreskin worrisome for possible malignancy.  Exam limited secondary to patient discomfort and phimosis.  I was able to identify the meatus and the scope was advanced into the urethra.  The urethra was diffusely narrowed, but I was ultimately able to navigate the scope into the bladder.  There were a few areas in the urethra that appeared shaggy and most papillary in nature.  The prostate was very large and abnormal appearing, and appeared to have a prior TURP defect with some regrowth of adenoma that was friable.  Once in the bladder, there were moderate bladder trabeculations, there are multiple areas of carpeted papillary appearing tumor on the lateral walls worrisome for CIS versus bladder cancer.  Retroflexion showed intravesical protrusion of the prostate with dilated blood vessels.  Cytology sent.  Imaging: CT not yet performed  Findings: 1.  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  Concern for urethral papillary lesions 3.  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  All of these could be contributing to his gross hematuria and UTIs.  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  We discussed at length the need for future treatments pending pathology of above procedure which could all be performed at one time.  We discussed transurethral resection of bladder tumor (TURBT) and risks and benefits at length. This is typically a 1 to 2-hour procedure done under general anesthesia in the operating room.  A scope is inserted through the urethra and used to resect abnormal tissue within the bladder, which is then sent to the pathologist to determine grade and stage of the tumor.  Risks include bleeding, infection, need for temporary Foley placement, and bladder perforation.  Treatment strategies are based on the type of tumor and depth of invasion.  -Information given to schedule CT -When CT resulted, will call to schedule penile biopsy, cystoscopy, urethral biopsy, and bladder biopsy and fulguration   Legrand Rams, MD 08/24/2020

## 2020-08-25 LAB — URINALYSIS, COMPLETE
Bilirubin, UA: NEGATIVE
Glucose, UA: NEGATIVE
Ketones, UA: NEGATIVE
Leukocytes,UA: NEGATIVE
Nitrite, UA: NEGATIVE
Protein,UA: NEGATIVE
Specific Gravity, UA: 1.01 (ref 1.005–1.030)
Urobilinogen, Ur: 0.2 mg/dL (ref 0.2–1.0)
pH, UA: 5.5 (ref 5.0–7.5)

## 2020-08-25 LAB — MICROSCOPIC EXAMINATION
Bacteria, UA: NONE SEEN
Epithelial Cells (non renal): NONE SEEN /hpf (ref 0–10)

## 2020-08-28 LAB — CYTOLOGY - NON PAP

## 2020-08-31 ENCOUNTER — Other Ambulatory Visit: Payer: Self-pay

## 2020-08-31 ENCOUNTER — Ambulatory Visit (INDEPENDENT_AMBULATORY_CARE_PROVIDER_SITE_OTHER): Payer: Medicare Other | Admitting: Internal Medicine

## 2020-08-31 VITALS — BP 165/90 | HR 88

## 2020-08-31 DIAGNOSIS — B20 Human immunodeficiency virus [HIV] disease: Secondary | ICD-10-CM

## 2020-08-31 DIAGNOSIS — Z950 Presence of cardiac pacemaker: Secondary | ICD-10-CM

## 2020-08-31 DIAGNOSIS — N401 Enlarged prostate with lower urinary tract symptoms: Secondary | ICD-10-CM | POA: Diagnosis not present

## 2020-08-31 DIAGNOSIS — I10 Essential (primary) hypertension: Secondary | ICD-10-CM | POA: Insufficient documentation

## 2020-08-31 MED ORDER — METOPROLOL SUCCINATE ER 25 MG PO TB24: 25.0000 mg | ORAL_TABLET | Freq: Every day | ORAL | 3 refills | Status: DC

## 2020-08-31 NOTE — Progress Notes (Signed)
Established Patient Office Visit  Subjective:  Patient ID: Brian [name], male    DOB: [birth year]  Age: 79 y.o. MRN: [id number]  CC:  Chief Complaint  Patient presents with  . Pacemaker Check    HPI  Brian [name] presents for patient to check in office visit.  Past Medical History:  Diagnosis Date  . Arthritis   . Asthma   . Bradycardia   . Depression   . HIV (human immunodeficiency virus infection) (HCC)   . Hyperlipidemia     Past Surgical History:  Procedure Laterality Date  . cataracts    . EYE SURGERY    . PACEMAKER INSERTION N/A 08/02/2015   Procedure: INSERTION PACEMAKER;  Surgeon: Corky Downs, MD;  Location: [name];  Service: Cardiovascular;  Laterality: N/A;    Family History  Problem Relation Age of Onset  . Hypertension Other     Social History   Socioeconomic History  . Marital status: Married    Spouse name: Not on file  . Number of children: Not on file  . Years of education: Not on file  . Highest education level: Not on file  Occupational History  . Not on file  Tobacco Use  . Smoking status: Never Smoker  . Smokeless tobacco: Never Used  Vaping Use  . Vaping Use: Never used  Substance and Sexual Activity  . Alcohol use: Yes    Alcohol/week: 2.0 standard drinks    Types: 2 Cans of beer per week    Comment: 1-2 drinks a day  . Drug use: No  . Sexual activity: Not on file  Other Topics Concern  . Not on file  Social History Narrative  . Not on file   Social Determinants of Health   Financial Resource Strain:   . Difficulty of Paying Living Expenses: Not on file  Food Insecurity:   . Worried About Programme researcher, broadcasting/film/video in the Last Year: Not on file  . Ran Out of Food in the Last Year: Not on file  Transportation Needs:   . Lack of Transportation (Medical): Not on file  . Lack of Transportation (Non-Medical): Not on file  Physical Activity:   . Days of Exercise per Week: Not on file  . Minutes of Exercise per Session: Not on  file  Stress:   . Feeling of Stress : Not on file  Social Connections:   . Frequency of Communication with Friends and Family: Not on file  . Frequency of Social Gatherings with Friends and Family: Not on file  . Attends Religious Services: Not on file  . Active Member of Clubs or Organizations: Not on file  . Attends Banker Meetings: Not on file  . Marital Status: Not on file  Intimate Partner Violence:   . Fear of Current or Ex-Partner: Not on file  . Emotionally Abused: Not on file  . Physically Abused: Not on file  . Sexually Abused: Not on file     Current Outpatient Medications:  .  alendronate (FOSAMAX) 70 MG tablet, Take 70 mg by mouth once a week. Take on Saturday., Disp: , Rfl: 5 .  atorvastatin (LIPITOR) 40 MG tablet, Take 40 mg by mouth daily., Disp: , Rfl:  .  clonazePAM (KLONOPIN) 0.5 MG tablet, Take 0.5 mg by mouth 2 (two) times daily as needed for anxiety., Disp: , Rfl:  .  DULoxetine (CYMBALTA) 30 MG capsule, Take 30 mg by mouth daily., Disp: , Rfl:  .  dutasteride (AVODART)  0.5 MG capsule, Take 0.5 mg by mouth daily. , Disp: , Rfl: 5 .  loratadine (CLARITIN) 10 MG tablet, Take 10 mg by mouth daily., Disp: , Rfl:  .  nystatin-triamcinolone ointment (MYCOLOG), Apply 1 application topically 2 (two) times daily., Disp: 30 g, Rfl: 0 .  omeprazole (PRILOSEC) 20 MG capsule, Take 20 mg by mouth daily., Disp: , Rfl: 5 .  tamsulosin (FLOMAX) 0.4 MG CAPS capsule, Take 0.4 mg by mouth daily. , Disp: , Rfl: 5 .  TRIUMEQ 600-50-300 MG tablet, Take 1 tablet by mouth daily., Disp: , Rfl:  .  VENTOLIN HFA 108 (90 BASE) MCG/ACT inhaler, Inhale 2 puffs into the lungs 4 (four) times daily as needed. For shortness of breath and/or wheezing., Disp: , Rfl: 5 .  metoprolol succinate (TOPROL-XL) 25 MG 24 hr tablet, Take 1 tablet (25 mg total) by mouth daily., Disp: 90 tablet, Rfl: 3   No Known Allergies  ROS Review of Systems  Constitutional: Negative.   HENT: Negative.     Eyes: Negative.   Respiratory: Negative.   Cardiovascular: Negative.   Gastrointestinal: Negative.   Endocrine: Negative.   Genitourinary: Negative.   Musculoskeletal: Negative.   Skin: Negative.   Allergic/Immunologic: Negative.   Neurological: Negative.   Hematological: Negative.   Psychiatric/Behavioral: Negative.   All other systems reviewed and are negative.     Objective:    Physical Exam Vitals reviewed.  Constitutional:      Appearance: Normal appearance.  HENT:     Mouth/Throat:     Mouth: Mucous membranes are moist.  Eyes:     Pupils: Pupils are equal, round, and reactive to light.  Neck:     Vascular: No carotid bruit.  Cardiovascular:     Rate and Rhythm: Normal rate and regular rhythm.     Pulses: Normal pulses.     Heart sounds: Normal heart sounds.  Pulmonary:     Effort: Pulmonary effort is normal.     Breath sounds: Normal breath sounds.  Abdominal:     General: Bowel sounds are normal.     Palpations: Abdomen is soft. There is no hepatomegaly, splenomegaly or mass.     Tenderness: There is no abdominal tenderness.     Hernia: No hernia is present.  Musculoskeletal:     Cervical back: Neck supple.     Right lower leg: No edema.     Left lower leg: No edema.  Skin:    Findings: No rash.  Neurological:     Mental Status: He is alert and oriented to person, place, and time.     Motor: No weakness.  Psychiatric:        Mood and Affect: Mood normal.        Behavior: Behavior normal.     BP (!) 165/90   Pulse 88  Wt Readings from Last 3 Encounters:  08/24/20 163 lb (73.9 kg)  08/10/20 163 lb (73.9 kg)  07/27/20 163 lb (73.9 kg)     Health Maintenance Due  Topic Date Due  . COVID-19 Vaccine (1) Never done  . INFLUENZA VACCINE  05/28/2020    There are no preventive care reminders to display for this patient.  Lab Results  Component Value Date   TSH 0.532 10/16/2014   Lab Results  Component Value Date   WBC 6.6 08/10/2020   HGB  15.2 08/10/2020   HCT 45.4 08/10/2020   MCV 87 08/10/2020   PLT 134 (L) 08/10/2020   Lab Results  Component Value Date   NA 136 08/10/2020   K 3.2 (L) 08/10/2020   CO2 20 (L) 08/10/2020   GLUCOSE 118 (H) 08/10/2020   BUN 11 08/10/2020   CREATININE 1.15 08/10/2020   BILITOT 0.7 08/10/2020   ALKPHOS 40 08/10/2020   AST 37 08/10/2020   ALT 28 08/10/2020   PROT 7.8 08/10/2020   ALBUMIN 4.1 08/10/2020   CALCIUM 8.8 (L) 08/10/2020   ANIONGAP 12 08/10/2020   Lab Results  Component Value Date   CHOL 124 10/16/2014   Lab Results  Component Value Date   HDL 35 (L) 10/16/2014   Lab Results  Component Value Date   LDLCALC 57 10/16/2014   Lab Results  Component Value Date   TRIG 158 10/16/2014   No results found for: CHOLHDL No results found for: PZWC5E    Assessment & Plan:   Problem List Items Addressed This Visit      Cardiovascular and Mediastinum   Essential hypertension    Blood pressure was found to be elevated today.  Patient was informed about following DASH diet.  He also need to lose weight.  And walk on a daily basis.      Relevant Medications   metoprolol succinate (TOPROL-XL) 25 MG 24 hr tablet     Genitourinary   BPH (benign prostatic hyperplasia) (Chronic)    Patient was referred to neurologist.        Other   HIV (human immunodeficiency virus infection) (HCC) (Chronic)    Patient general condition is stable he is being followed up by the infectious diseases guy who is giving him his medications.      Cardiac pacemaker - Primary    Pacemaker is working well with estimated life of the pacemaker about 8 years.  Patient not having any arrhythmia on checking.      Relevant Medications   metoprolol succinate (TOPROL-XL) 25 MG 24 hr tablet   Other Relevant Orders   PACEMAKER IN CLINIC CHECK    Patient pacemaker was interrogated by pacemakers analyzer, battery status is okay.  No programming changes were indicated after the review of the data.   Histogram shows no change since the last interrogation Atrial and ventricular sensing thresholds were found to be acceptable Impedance was checked and it was found to be normal.  Thresholds were found to be okay on evaluation of rhythm problem.  No high rate or low rate arrhythmia were noted.  Estimated battery longevity is 8 yrs . I have personally reviewed the device data and amended the report as necessary.   Meds ordered this encounter  Medications  . metoprolol succinate (TOPROL-XL) 25 MG 24 hr tablet    Sig: Take 1 tablet (25 mg total) by mouth daily.    Dispense:  90 tablet    Refill:  3    Follow-up: No follow-ups on file.    Corky Downs, MD

## 2020-09-03 ENCOUNTER — Encounter: Payer: Self-pay | Admitting: Internal Medicine

## 2020-09-03 NOTE — Assessment & Plan Note (Signed)
Patient was referred to neurologist. 

## 2020-09-03 NOTE — Assessment & Plan Note (Signed)
Blood pressure was found to be elevated today.  Patient was informed about following DASH diet.  He also need to lose weight.  And walk on a daily basis.

## 2020-09-03 NOTE — Assessment & Plan Note (Signed)
Pacemaker is working well with estimated life of the pacemaker about 8 years.  Patient not having any arrhythmia on checking.

## 2020-09-03 NOTE — Assessment & Plan Note (Signed)
Patient general condition is stable he is being followed up by the infectious diseases guy who is giving him his medications.

## 2020-09-07 ENCOUNTER — Ambulatory Visit
Admission: RE | Admit: 2020-09-07 | Discharge: 2020-09-07 | Disposition: A | Discharge status: Home / Self Care | Payer: Medicare Other | Source: Ambulatory Visit | Attending: Urology | Admitting: Urology

## 2020-09-07 ENCOUNTER — Other Ambulatory Visit: Payer: Self-pay

## 2020-09-07 DIAGNOSIS — R310 Gross hematuria: Secondary | ICD-10-CM | POA: Insufficient documentation

## 2020-09-07 MED ORDER — IOHEXOL 300 MG/ML  SOLN
125.0000 mL | Freq: Once | INTRAMUSCULAR | Status: AC | PRN
  Administered 2020-09-07: 125 mL via INTRAVENOUS

## 2020-09-08 ENCOUNTER — Telehealth: Payer: Self-pay

## 2020-09-08 MED FILL — TRIUMEQ 600-50-300 MG TABS: 600-50-300 | 30 days supply | Qty: 30 | Fill #1

## 2020-09-08 NOTE — Telephone Encounter (Signed)
[facility] Patient Advocate Encounter  [facility] and I have been unsuccsessful in reaching patient to be able to refill medication.    We have tried multiple times without a response.  Jessikah Dicker, CPhT [facility]ty Pharmacy Patient Advocate [facility] Phone: [phone] Fax:  [phone]  

## 2020-10-02 ENCOUNTER — Encounter: Payer: Self-pay | Admitting: Internal Medicine

## 2020-10-02 ENCOUNTER — Other Ambulatory Visit: Payer: Self-pay

## 2020-10-02 ENCOUNTER — Ambulatory Visit (INDEPENDENT_AMBULATORY_CARE_PROVIDER_SITE_OTHER): Payer: Medicare Other | Admitting: Internal Medicine

## 2020-10-02 VITALS — BP 143/83 | HR 85 | Ht 68.0 in | Wt 166.6 lb

## 2020-10-02 DIAGNOSIS — I10 Essential (primary) hypertension: Secondary | ICD-10-CM | POA: Diagnosis not present

## 2020-10-02 DIAGNOSIS — J449 Chronic obstructive pulmonary disease, unspecified: Secondary | ICD-10-CM | POA: Diagnosis not present

## 2020-10-02 DIAGNOSIS — G6289 Other specified polyneuropathies: Secondary | ICD-10-CM

## 2020-10-02 DIAGNOSIS — N401 Enlarged prostate with lower urinary tract symptoms: Secondary | ICD-10-CM

## 2020-10-02 NOTE — Progress Notes (Signed)
Established Patient Office Visit  Subjective:  Patient ID: Brian [name], male    DOB: December 05, 1940  Age: 79 y.o. MRN: [id number]  CC:  Chief Complaint  Patient presents with  . Follow-up    no complaints     HPI  Brian [name] presents for for general checkup he denies any chest pain shortness of breath.  Neuropathy is stable.  Past Medical History:  Diagnosis Date  . Arthritis   . Asthma   . Bradycardia   . Depression   . HIV (human immunodeficiency virus infection) (HCC)   . Hyperlipidemia     Past Surgical History:  Procedure Laterality Date  . cataracts    . EYE SURGERY    . PACEMAKER INSERTION N/A 08/02/2015   Procedure: INSERTION PACEMAKER;  Surgeon: Corky Downs, MD;  Location: [name];  Service: Cardiovascular;  Laterality: N/A;    Family History  Problem Relation Age of Onset  . Hypertension Other     Social History   Socioeconomic History  . Marital status: Married    Spouse name: Not on file  . Number of children: Not on file  . Years of education: Not on file  . Highest education level: Not on file  Occupational History  . Not on file  Tobacco Use  . Smoking status: Never Smoker  . Smokeless tobacco: Never Used  Vaping Use  . Vaping Use: Never used  Substance and Sexual Activity  . Alcohol use: Yes    Alcohol/week: 2.0 standard drinks    Types: 2 Cans of beer per week    Comment: 1-2 drinks a day  . Drug use: No  . Sexual activity: Not on file  Other Topics Concern  . Not on file  Social History Narrative  . Not on file   Social Determinants of Health   Financial Resource Strain:   . Difficulty of Paying Living Expenses: Not on file  Food Insecurity:   . Worried About Programme researcher, broadcasting/film/video in the Last Year: Not on file  . Ran Out of Food in the Last Year: Not on file  Transportation Needs:   . Lack of Transportation (Medical): Not on file  . Lack of Transportation (Non-Medical): Not on file  Physical Activity:   . Days of Exercise  per Week: Not on file  . Minutes of Exercise per Session: Not on file  Stress:   . Feeling of Stress : Not on file  Social Connections:   . Frequency of Communication with Friends and Family: Not on file  . Frequency of Social Gatherings with Friends and Family: Not on file  . Attends Religious Services: Not on file  . Active Member of Clubs or Organizations: Not on file  . Attends Banker Meetings: Not on file  . Marital Status: Not on file  Intimate Partner Violence:   . Fear of Current or Ex-Partner: Not on file  . Emotionally Abused: Not on file  . Physically Abused: Not on file  . Sexually Abused: Not on file     Current Outpatient Medications:  .  alendronate (FOSAMAX) 70 MG tablet, Take 70 mg by mouth once a week. Take on Saturday., Disp: , Rfl: 5 .  atorvastatin (LIPITOR) 40 MG tablet, Take 40 mg by mouth daily., Disp: , Rfl:  .  clonazePAM (KLONOPIN) 0.5 MG tablet, Take 0.5 mg by mouth 2 (two) times daily as needed for anxiety., Disp: , Rfl:  .  DULoxetine (CYMBALTA) 30 MG capsule,  Take 30 mg by mouth daily., Disp: , Rfl:  .  dutasteride (AVODART) 0.5 MG capsule, Take 0.5 mg by mouth daily. , Disp: , Rfl: 5 .  loratadine (CLARITIN) 10 MG tablet, Take 10 mg by mouth daily., Disp: , Rfl:  .  metoprolol succinate (TOPROL-XL) 25 MG 24 hr tablet, Take 1 tablet (25 mg total) by mouth daily., Disp: 90 tablet, Rfl: 3 .  nystatin-triamcinolone ointment (MYCOLOG), Apply 1 application topically 2 (two) times daily., Disp: 30 g, Rfl: 0 .  omeprazole (PRILOSEC) 20 MG capsule, Take 20 mg by mouth daily., Disp: , Rfl: 5 .  tamsulosin (FLOMAX) 0.4 MG CAPS capsule, Take 0.4 mg by mouth daily. , Disp: , Rfl: 5 .  TRIUMEQ 600-50-300 MG tablet, Take 1 tablet by mouth daily., Disp: , Rfl:  .  VENTOLIN HFA 108 (90 BASE) MCG/ACT inhaler, Inhale 2 puffs into the lungs 4 (four) times daily as needed. For shortness of breath and/or wheezing., Disp: , Rfl: 5   No Known  Allergies  ROS Review of Systems  Constitutional: Negative.   HENT: Negative.   Eyes: Negative.   Respiratory: Negative.   Cardiovascular: Negative.   Gastrointestinal: Negative.   Endocrine: Negative.   Genitourinary: Negative.   Musculoskeletal: Negative.   Skin: Negative.   Allergic/Immunologic: Negative.   Neurological: Negative.   Hematological: Negative.   Psychiatric/Behavioral: Negative.   All other systems reviewed and are negative.     Objective:    Physical Exam Vitals reviewed.  Constitutional:      Appearance: Normal appearance.  HENT:     Mouth/Throat:     Mouth: Mucous membranes are moist.  Eyes:     Pupils: Pupils are equal, round, and reactive to light.  Neck:     Vascular: No carotid bruit.  Cardiovascular:     Rate and Rhythm: Normal rate and regular rhythm.     Pulses: Normal pulses.     Heart sounds: Normal heart sounds.  Pulmonary:     Effort: Pulmonary effort is normal.     Breath sounds: Normal breath sounds.  Abdominal:     General: Bowel sounds are normal.     Palpations: Abdomen is soft. There is no hepatomegaly, splenomegaly or mass.     Tenderness: There is no abdominal tenderness.     Hernia: No hernia is present.  Musculoskeletal:     Cervical back: Neck supple.     Right lower leg: No edema.     Left lower leg: No edema.  Skin:    Findings: No rash.  Neurological:     Mental Status: He is alert and oriented to person, place, and time.     Motor: No weakness.  Psychiatric:        Mood and Affect: Mood normal.        Behavior: Behavior normal.     BP (!) 143/83   Pulse 85   Ht 5\' 8"  (1.727 m)   Wt 166 lb 9.6 oz (75.6 kg)   BMI 25.33 kg/m  Wt Readings from Last 3 Encounters:  10/02/20 166 lb 9.6 oz (75.6 kg)  08/24/20 163 lb (73.9 kg)  08/10/20 163 lb (73.9 kg)     Health Maintenance Due  Topic Date Due  . COVID-19 Vaccine (1) Never done  . INFLUENZA VACCINE  05/28/2020    There are no preventive care  reminders to display for this patient.  Lab Results  Component Value Date   TSH 0.532 10/16/2014  Lab Results  Component Value Date   WBC 6.6 08/10/2020   HGB 15.2 08/10/2020   HCT 45.4 08/10/2020   MCV 87 08/10/2020   PLT 134 (L) 08/10/2020   Lab Results  Component Value Date   NA 136 08/10/2020   K 3.2 (L) 08/10/2020   CO2 20 (L) 08/10/2020   GLUCOSE 118 (H) 08/10/2020   BUN 11 08/10/2020   CREATININE 1.15 08/10/2020   BILITOT 0.7 08/10/2020   ALKPHOS 40 08/10/2020   AST 37 08/10/2020   ALT 28 08/10/2020   PROT 7.8 08/10/2020   ALBUMIN 4.1 08/10/2020   CALCIUM 8.8 (L) 08/10/2020   ANIONGAP 12 08/10/2020   Lab Results  Component Value Date   CHOL 124 10/16/2014   Lab Results  Component Value Date   HDL 35 (L) 10/16/2014   Lab Results  Component Value Date   LDLCALC 57 10/16/2014   Lab Results  Component Value Date   TRIG 158 10/16/2014   No results found for: CHOLHDL No results found for: ZYSA6T    Assessment & Plan:   Problem List Items Addressed This Visit      Cardiovascular and Mediastinum   Essential hypertension    - Today, the patient's blood pressure is well managed on present treatment. - The patient will continue the current treatment regimen.  - I encouraged the patient to eat a low-sodium diet to help control blood pressure. - I encouraged the patient to live an active lifestyle and complete activities that increases heart rate to 85% target heart rate at least 5 times per week for one hour.            Respiratory   COPD (chronic obstructive pulmonary disease) (HCC)    Stable  No wheezing        Nervous and Auditory   Peripheral neuropathy - Primary    Not active        Genitourinary   BPH (benign prostatic hyperplasia) (Chronic)    No dysuria or frequency         No orders of the defined types were placed in this encounter.   Follow-up: No follow-ups on file.    Corky Downs, MD

## 2020-10-02 NOTE — Assessment & Plan Note (Signed)
Not active 

## 2020-10-02 NOTE — Assessment & Plan Note (Signed)
-   Today, the patient's blood pressure is well managed on present  treatment. - The patient will continue the current treatment regimen.  - I encouraged the patient to eat a low-sodium diet to help control blood pressure. - I encouraged the patient to live an active lifestyle and complete activities that increases heart rate to 85% target heart rate at least 5 times per week for one hour.     

## 2020-10-02 NOTE — Assessment & Plan Note (Signed)
Stable  No wheezing

## 2020-10-02 NOTE — Assessment & Plan Note (Signed)
No dysuria or frequency

## 2020-10-10 MED FILL — TRIUMEQ 600-50-300 MG TABS: 600-50-300 | 30 days supply | Qty: 30 | Fill #2

## 2020-10-18 NOTE — Progress Notes (Signed)
Established Patient Office Visit  Subjective:  Patient ID: Brian [name], male    DOB: Nov 12, 1940  Age: 79 y.o. MRN: [id number]  CC:  Chief Complaint  Patient presents with  . Follow-up    no complaints     HPI  Brian [name] presents for general check up  Past Medical History:  Diagnosis Date  . Arthritis   . Asthma   . Bradycardia   . Depression   . HIV (human immunodeficiency virus infection) (HCC)   . Hyperlipidemia     Past Surgical History:  Procedure Laterality Date  . cataracts    . EYE SURGERY    . PACEMAKER INSERTION N/A 08/02/2015   Procedure: INSERTION PACEMAKER;  Surgeon: Corky Downs, MD;  Location: [name];  Service: Cardiovascular;  Laterality: N/A;    Family History  Problem Relation Age of Onset  . Hypertension Other     Social History   Socioeconomic History  . Marital status: Married    Spouse name: Not on file  . Number of children: Not on file  . Years of education: Not on file  . Highest education level: Not on file  Occupational History  . Not on file  Tobacco Use  . Smoking status: Never Smoker  . Smokeless tobacco: Never Used  Vaping Use  . Vaping Use: Never used  Substance and Sexual Activity  . Alcohol use: Yes    Alcohol/week: 2.0 standard drinks    Types: 2 Cans of beer per week    Comment: 1-2 drinks a day  . Drug use: No  . Sexual activity: Not on file  Other Topics Concern  . Not on file  Social History Narrative  . Not on file   Social Determinants of Health   Financial Resource Strain: Not on file  Food Insecurity: Not on file  Transportation Needs: Not on file  Physical Activity: Not on file  Stress: Not on file  Social Connections: Not on file  Intimate Partner Violence: Not on file     Current Outpatient Medications:  .  alendronate (FOSAMAX) 70 MG tablet, Take 70 mg by mouth once a week. Take on Saturday., Disp: , Rfl: 5 .  atorvastatin (LIPITOR) 40 MG tablet, Take 40 mg by mouth daily., Disp: , Rfl:   .  clonazePAM (KLONOPIN) 0.5 MG tablet, Take 0.5 mg by mouth 2 (two) times daily as needed for anxiety., Disp: , Rfl:  .  DULoxetine (CYMBALTA) 30 MG capsule, Take 30 mg by mouth daily., Disp: , Rfl:  .  dutasteride (AVODART) 0.5 MG capsule, Take 0.5 mg by mouth daily. , Disp: , Rfl: 5 .  loratadine (CLARITIN) 10 MG tablet, Take 10 mg by mouth daily., Disp: , Rfl:  .  metoprolol succinate (TOPROL-XL) 25 MG 24 hr tablet, Take 1 tablet (25 mg total) by mouth daily., Disp: 90 tablet, Rfl: 3 .  nystatin-triamcinolone ointment (MYCOLOG), Apply 1 application topically 2 (two) times daily., Disp: 30 g, Rfl: 0 .  omeprazole (PRILOSEC) 20 MG capsule, Take 20 mg by mouth daily., Disp: , Rfl: 5 .  tamsulosin (FLOMAX) 0.4 MG CAPS capsule, Take 0.4 mg by mouth daily. , Disp: , Rfl: 5 .  TRIUMEQ 600-50-300 MG tablet, Take 1 tablet by mouth daily., Disp: , Rfl:  .  VENTOLIN HFA 108 (90 BASE) MCG/ACT inhaler, Inhale 2 puffs into the lungs 4 (four) times daily as needed. For shortness of breath and/or wheezing., Disp: , Rfl: 5   No Known Allergies  ROS Review of Systems  Constitutional: Negative.   HENT: Negative.   Eyes: Negative.   Respiratory: Negative.   Cardiovascular: Negative.   Gastrointestinal: Negative.   Endocrine: Negative.   Genitourinary: Negative.   Musculoskeletal: Negative.   Skin: Negative.   Allergic/Immunologic: Negative.   Neurological: Negative.   Hematological: Negative.   Psychiatric/Behavioral: Negative.   All other systems reviewed and are negative.     Objective:    Physical Exam Vitals reviewed.  Constitutional:      Appearance: Normal appearance.  HENT:     Mouth/Throat:     Mouth: Mucous membranes are moist.  Eyes:     Pupils: Pupils are equal, round, and reactive to light.  Neck:     Vascular: No carotid bruit.  Cardiovascular:     Rate and Rhythm: Normal rate and regular rhythm.     Pulses: Normal pulses.     Heart sounds: Normal heart sounds.   Pulmonary:     Effort: Pulmonary effort is normal.     Breath sounds: Normal breath sounds.  Abdominal:     General: Bowel sounds are normal.     Palpations: Abdomen is soft. There is no hepatomegaly, splenomegaly or mass.     Tenderness: There is no abdominal tenderness.     Hernia: No hernia is present.  Musculoskeletal:     Cervical back: Neck supple.     Right lower leg: No edema.     Left lower leg: No edema.  Skin:    Findings: No rash.  Neurological:     Mental Status: He is alert and oriented to person, place, and time.     Motor: No weakness.  Psychiatric:        Mood and Affect: Mood normal.        Behavior: Behavior normal.     BP (!) 143/83   Pulse 85   Ht 5\' 8"  (1.727 m)   Wt 166 lb 9.6 oz (75.6 kg)   BMI 25.33 kg/m  Wt Readings from Last 3 Encounters:  10/02/20 166 lb 9.6 oz (75.6 kg)  08/24/20 163 lb (73.9 kg)  08/10/20 163 lb (73.9 kg)     Health Maintenance Due  Topic Date Due  . COVID-19 Vaccine (1) Never done  . INFLUENZA VACCINE  05/28/2020    There are no preventive care reminders to display for this patient.  Lab Results  Component Value Date   TSH 0.532 10/16/2014   Lab Results  Component Value Date   WBC 6.6 08/10/2020   HGB 15.2 08/10/2020   HCT 45.4 08/10/2020   MCV 87 08/10/2020   PLT 134 (L) 08/10/2020   Lab Results  Component Value Date   NA 136 08/10/2020   K 3.2 (L) 08/10/2020   CO2 20 (L) 08/10/2020   GLUCOSE 118 (H) 08/10/2020   BUN 11 08/10/2020   CREATININE 1.15 08/10/2020   BILITOT 0.7 08/10/2020   ALKPHOS 40 08/10/2020   AST 37 08/10/2020   ALT 28 08/10/2020   PROT 7.8 08/10/2020   ALBUMIN 4.1 08/10/2020   CALCIUM 8.8 (L) 08/10/2020   ANIONGAP 12 08/10/2020   Lab Results  Component Value Date   CHOL 124 10/16/2014   Lab Results  Component Value Date   HDL 35 (L) 10/16/2014   Lab Results  Component Value Date   LDLCALC 57 10/16/2014   Lab Results  Component Value Date   TRIG 158 10/16/2014    No results found for: CHOLHDL No results  found for: HGBA1C    Assessment & Plan:   Problem List Items Addressed This Visit      Cardiovascular and Mediastinum   Essential hypertension    - Today, the patient's blood pressure is well managed on present treatment. - The patient will continue the current treatment regimen.  - I encouraged the patient to eat a low-sodium diet to help control blood pressure. - I encouraged the patient to live an active lifestyle and complete activities that increases heart rate to 85% target heart rate at least 5 times per week for one hour.            Respiratory   COPD (chronic obstructive pulmonary disease) (HCC)    Stable  No wheezing        Nervous and Auditory   Peripheral neuropathy - Primary    Not active        Genitourinary   BPH (benign prostatic hyperplasia) (Chronic)    No dysuria or frequency         No orders of the defined types were placed in this encounter.   Follow-up: No follow-ups on file.    Corky Downs, MD

## 2020-10-25 ENCOUNTER — Ambulatory Visit (INDEPENDENT_AMBULATORY_CARE_PROVIDER_SITE_OTHER): Payer: Medicare Other | Admitting: Urology

## 2020-10-25 ENCOUNTER — Other Ambulatory Visit: Payer: Self-pay

## 2020-10-25 ENCOUNTER — Encounter: Payer: Self-pay | Admitting: Urology

## 2020-10-25 VITALS — BP 151/84 | HR 80 | Ht 68.0 in | Wt 165.0 lb

## 2020-10-25 DIAGNOSIS — N489 Disorder of penis, unspecified: Secondary | ICD-10-CM

## 2020-10-25 DIAGNOSIS — R310 Gross hematuria: Secondary | ICD-10-CM

## 2020-10-25 DIAGNOSIS — N390 Urinary tract infection, site not specified: Secondary | ICD-10-CM

## 2020-10-25 DIAGNOSIS — N329 Bladder disorder, unspecified: Secondary | ICD-10-CM

## 2020-10-25 LAB — URINALYSIS, COMPLETE
Bilirubin, UA: NEGATIVE
Glucose, UA: NEGATIVE
Ketones, UA: NEGATIVE
Nitrite, UA: NEGATIVE
Protein,UA: NEGATIVE
Specific Gravity, UA: 1.015 (ref 1.005–1.030)
Urobilinogen, Ur: 0.2 mg/dL (ref 0.2–1.0)
pH, UA: 5.5 (ref 5.0–7.5)

## 2020-10-25 LAB — MICROSCOPIC EXAMINATION: WBC, UA: >30 /hpf — AB (ref 0–5)

## 2020-10-25 MED ORDER — DUTASTERIDE 0.5 MG PO CAPS: 0.5000 mg | ORAL_CAPSULE | Freq: Every day | ORAL | 11 refills | Status: AC

## 2020-10-25 MED ORDER — TAMSULOSIN HCL 0.4 MG PO CAPS: 0.4000 mg | ORAL_CAPSULE | Freq: Every day | ORAL | 11 refills | Status: AC

## 2020-10-25 NOTE — Progress Notes (Signed)
   10/25/2020 1:47 PM   Brian [name] Mar 08, 1941 [id number]  Reason for visit: Follow up gross hematuria, recurrent UTIs  HPI: Briefly, 79 year old Spanish-speaking male with HIV who has had multiple episodes of gross hematuria, as well as multiple UTIs.  He reportedly has had numerous urologic surgeries before and urethral stricture disease, but none of those records are available to me.  He has a urethral stent that was placed in [place].  He currently denies any urinary symptoms, and remains on maximal medical therapy with Flomax and dutasteride.  Urinalysis is again concerning for infection today with greater than 30 WBCs, 3-10 RBCs, moderate bacteria, nitrite negative, 2+ leukocytes.  Will send for culture.  At our last visit I performed cystoscopy that showed possible penile lesions under the foreskin worrisome for penile cancer, papillary and shaggy lesions in the urethra worrisome for urothelial cell carcinoma, very large prostate with obstructing lateral lobes and friable tissue, multiple bladder trabeculations and areas of carpeted papillary appearing tumor worrisome for CIS versus bladder cancer.  Cytology was atypical.  CT urogram showed diffuse bladder wall thickening, urethral stent in the penile urethra, enlarged prostate measuring 130 g.  I personally viewed and interpreted the CT and agree with these findings.  I recommended penile biopsy, cystoscopy, urethral and bladder biopsy for definitive diagnosis.  He did not follow through with scheduling surgery, and I scheduled him for a visit today to again review his complex urologic findings.  Via translator I had another long conversation with the patient today about my concern for possible penile malignancy, urethral and bladder cancer.  I again strongly recommended biopsy for definitive diagnosis.  We also reviewed his CT results.  We discussed the small risk of bleeding, infection, possible Foley placement with this procedure.  He is  strongly opposed to any surgery, and repeatedly states that he does not think he has cancer.  I discussed the risks of deferring further evaluation with cystoscopy and biopsy including recurrent gross hematuria, clot retention, recurrent UTIs, progression of metastatic disease, and death.  He understands these risks and continues to defer any intervention.  I also offered him referral to [facility] for second opinion, and he also deferred this.  -Urine sent for culture, call with results and will send in antibiotics if positive -He was amenable to following up in 9 months to check on his gross hematuria, recurrent UTIs, and rediscuss surgery  I spent 45 total minutes on the day of the encounter including pre-visit review of the medical record, face-to-face time with the patient, and post visit ordering of labs/imaging/tests.   Sondra Come, MD  [facility] [address], [address] [place], [place] [address] [phone]

## 2020-10-30 ENCOUNTER — Telehealth: Payer: Self-pay

## 2020-10-30 DIAGNOSIS — N390 Urinary tract infection, site not specified: Secondary | ICD-10-CM

## 2020-10-30 LAB — CULTURE, URINE COMPREHENSIVE

## 2020-10-30 MED ORDER — SULFAMETHOXAZOLE-TRIMETHOPRIM 800-160 MG PO TABS
1.0000 | ORAL_TABLET | Freq: Two times a day (BID) | ORAL | 0 refills | Status: AC
Start: 2020-10-30 — End: 2020-11-09

## 2020-10-30 NOTE — Telephone Encounter (Signed)
Called pt using interpreter services, informed pt of the information below. Pt gave verbal understanding. RX sent.

## 2020-10-30 NOTE — Telephone Encounter (Signed)
-----   Message from Sondra Come, MD sent at 10/30/2020 12:06 PM EST ----- Recommend bactrim ds bid x 10 days for UTI  Legrand Rams, MD 10/30/2020

## 2020-11-09 ENCOUNTER — Ambulatory Visit: Payer: Medicare Other

## 2020-11-09 MED FILL — TRIUMEQ 600-50-300 MG TABS: 600-50-300 | 30 days supply | Qty: 30 | Fill #3

## 2020-11-14 ENCOUNTER — Ambulatory Visit: Payer: Medicare Other | Admitting: Infectious Diseases

## 2020-11-15 ENCOUNTER — Telehealth: Payer: Self-pay

## 2020-11-16 ENCOUNTER — Ambulatory Visit: Payer: Medicare Other

## 2020-12-04 MED FILL — TRIUMEQ 600-50-300 MG TABS: 600-50-300 | 30 days supply | Qty: 30 | Fill #4

## 2020-12-14 ENCOUNTER — Other Ambulatory Visit
Admission: RE | Admit: 2020-12-14 | Discharge: 2020-12-14 | Disposition: A | Discharge status: Home / Self Care | Payer: Medicare Other | Source: Ambulatory Visit | Attending: Infectious Diseases | Admitting: Infectious Diseases

## 2020-12-14 ENCOUNTER — Other Ambulatory Visit: Payer: Self-pay

## 2020-12-14 ENCOUNTER — Ambulatory Visit: Payer: Medicare Other

## 2020-12-14 ENCOUNTER — Encounter: Payer: Self-pay | Admitting: Infectious Diseases

## 2020-12-14 ENCOUNTER — Ambulatory Visit: Payer: Medicare Other | Attending: Infectious Diseases | Admitting: Infectious Diseases

## 2020-12-14 VITALS — BP 121/79 | HR 78 | Resp 16 | Ht 68.0 in | Wt 169.0 lb

## 2020-12-14 DIAGNOSIS — Z79891 Long term (current) use of opiate analgesic: Secondary | ICD-10-CM | POA: Diagnosis not present

## 2020-12-14 DIAGNOSIS — Z950 Presence of cardiac pacemaker: Secondary | ICD-10-CM | POA: Diagnosis not present

## 2020-12-14 DIAGNOSIS — Z79899 Other long term (current) drug therapy: Secondary | ICD-10-CM | POA: Insufficient documentation

## 2020-12-14 DIAGNOSIS — I10 Essential (primary) hypertension: Secondary | ICD-10-CM | POA: Insufficient documentation

## 2020-12-14 DIAGNOSIS — Z21 Asymptomatic human immunodeficiency virus [HIV] infection status: Secondary | ICD-10-CM | POA: Insufficient documentation

## 2020-12-14 DIAGNOSIS — B20 Human immunodeficiency virus [HIV] disease: Secondary | ICD-10-CM | POA: Insufficient documentation

## 2020-12-14 DIAGNOSIS — Z8616 Personal history of COVID-19: Secondary | ICD-10-CM | POA: Insufficient documentation

## 2020-12-14 DIAGNOSIS — F40298 Other specified phobia: Secondary | ICD-10-CM

## 2020-12-14 DIAGNOSIS — R319 Hematuria, unspecified: Secondary | ICD-10-CM | POA: Diagnosis not present

## 2020-12-14 DIAGNOSIS — E785 Hyperlipidemia, unspecified: Secondary | ICD-10-CM | POA: Insufficient documentation

## 2020-12-14 LAB — COMPREHENSIVE METABOLIC PANEL
ALT: 22 U/L (ref 0–44)
AST: 30 U/L (ref 15–41)
Albumin: 3.7 g/dL (ref 3.5–5.0)
Alkaline Phosphatase: 40 U/L (ref 38–126)
Anion gap: 11 (ref 5–15)
BUN: 24 mg/dL — ABNORMAL HIGH (ref 8–23)
CO2: 20 mmol/L — ABNORMAL LOW (ref 22–32)
Calcium: 9 mg/dL (ref 8.9–10.3)
Chloride: 104 mmol/L (ref 98–111)
Creatinine, Ser: 1.17 mg/dL (ref 0.61–1.24)
GFR, Estimated: >60 mL/min (ref >60)
Glucose, Bld: 104 mg/dL — ABNORMAL HIGH (ref 70–99)
Potassium: 3.6 mmol/L (ref 3.5–5.1)
Sodium: 135 mmol/L (ref 135–145)
Total Bilirubin: 0.7 mg/dL (ref 0.3–1.2)
Total Protein: 8.2 g/dL — ABNORMAL HIGH (ref 6.5–8.1)

## 2020-12-14 LAB — LIPID PANEL
Cholesterol: 212 mg/dL — ABNORMAL HIGH (ref 0–200)
HDL: 40 mg/dL — ABNORMAL LOW (ref >40)
LDL Cholesterol: 117 mg/dL — ABNORMAL HIGH (ref 0–99)
Total CHOL/HDL Ratio: 5.3 RATIO
Triglycerides: 275 mg/dL — ABNORMAL HIGH (ref <150)
VLDL: 55 mg/dL — ABNORMAL HIGH (ref 0–40)

## 2020-12-14 MED ORDER — TRIUMEQ 600-50-300 MG PO TABS: 1.0000 | ORAL_TABLET | Freq: Every day | ORAL | 6 refills | Status: DC

## 2020-12-14 NOTE — Progress Notes (Signed)
NAME: Brian [name]  DOB: December 12, 1940  MRN: [id number]  Date/Time: 12/14/2020 10:38 AM Subjective:  Patient here for follow-up of HIV  Patient son is with him during this visit Alean Rinne ( [place] interpreter) is here for the whole visit  Joshva Labreck is a 80 y.o. 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  Since I last saw him he had seen his PCP, cardiologist and urologist. He had Covid infection recently and was sicker than normal but did not get a hospitalized.    He has had frank hematuria and has seen Dr. Diamantina Providence and he had scheduled him for cystoscopy and biopsy but the patient did not undergo that procedure.  He last saw Dr.Sninsky n 10/25/2020. He was offered the procedure again but patient has deferred it.  Patient is on Triumeq.  Now is 100% adherent.  His son says that they would [facility] because they have had some trouble with [place] [facility]. Also son says he has had unprotected sex with 2 women.  He was asking me whether he could have another HIV infection.   HIV history Diagnosed in 2014 when he found out that his wife in [place] had died of AIDS. He is originally from there but has been in [place] for the past 31 years HIV diagnosed 2014 1st visit to [facility] 11/25/13 Nadir cd4 was 120 (5%) on 10/29/2013 VL 273,240 from 10/29/2013  HAARt history 1st regimen was complera 2nd regimen triumeq As per Dr.Fitzgerald's note on 08/06/16  Recommendations HIV -he has had some issues with compliance with Complera in the past. His viral load has been slightly elevated now for several checks.  I would like to consider switching him to another regimen potentially more potent given the concern for developing resistance. He had pan sensitive virus on genotype in 2015. WIll check HLA [id number] and if neg start triumeq   Telephone Encounter - [name], MD -  08/29/2016 11:35 AM EDT please let pt know that his testing we did to change his medicine to a stronger one was good  I have sent in a new medicine triumeq to take in place of the Complera. He should stop the complera once he gets the triumeq. He should call if any new problems - otherwise I will see him in Jan   Electronically signed by [name], MD at 08/29/2016 11:39 AM EDT   Acquired thru- heterosexual contact Genotype highly sensitive organism  ? Past Medical History:  Diagnosis Date  . Arthritis   . Asthma   . Bradycardia   . Depression   . HIV (human immunodeficiency virus infection) ([place])   . Hyperlipidemia   Osteoporosis   Past Surgical History:  Procedure Laterality Date  . cataracts    . EYE SURGERY    . PACEMAKER INSERTION N/A 08/02/2015   Procedure: INSERTION PACEMAKER;  Surgeon: Cletis Athens, MD;  Location: [name];  Service: Cardiovascular;  Laterality: N/A;  prosthesis for urinary flow   SH Lives on his own 6 children and many grandchildren Non smoker No alcohol or illicit drug use   Family History  Problem Relation Age of Onset  . Hypertension Other    No Known Allergies    ?Current outpatient medications reviewed  Tramadol -acetaminophen Ventolin HFA Clonazepam 0.5 mg bid Celecoxib  Azelastine tamsulosin Omeprazole Dutasteride Paroxetine pregabalin Duloxetine triumeq atorvastatin Alendronate 28m once a week  REVIEW  OF SYSTEMS:  Const: negative fever, negative chills, negative weight loss Eyes: negative diplopia or visual changes, negative eye pain ENT: negative coryza, negative sore throat Resp: negative cough, hemoptysis, dyspnea Cards: negative for chest pain, palpitations, lower extremity edema GU: negative for frequency, dysuria and hematuria Skin: negative for rash and pruritus Heme: negative for easy bruising and gum/nose bleeding MS: negative for myalgias, arthralgias, back pain and muscle  weakness Neurolo: no headache or dizziness Psych: negative for feelings of anxiety, depression   Objective:  BP 121/79   Pulse 78   Resp 16   Ht '5\' 8"'  (1.727 m)   Wt 169 lb (76.7 kg)   SpO2 96%   BMI 25.70 kg/m    PHYSICAL EXAM:  General: Alert, cooperative, no distress, appears stated age.  Head: Normocephalic, without obvious abnormality, atraumatic. Eyes: Conjunctivae clear, anicteric sclerae. Pupils are equal Nose: Nares normal. No drainage or sinus tenderness. Throat: Lips, mucosa, and tongue normal. No Thrush Neck: Supple, symmetrical, no adenopathy, thyroid: non tender no carotid bruit and no JVD. Back: No CVA tenderness. Lungs: Clear to auscultation bilaterally. No Wheezing or Rhonchi. No rales. Heart: Regular rate and rhythm, no murmur, rub or gallop.pacemaker site fine Abdomen: Soft, non-tender,not distended. Bowel sounds normal. No masses Skin: No rashes or lesions. Not Jaundiced Lymph: Cervical, supraclavicular normal. Neurologic: Grossly non-focal   Health maintenance Vaccination   Vaccine Date last given comment  Influenza    Hepatitis B    Hepatitis A    Prevnar-PCV-13 12/01/18   Pneumovac-PPSV-23 2016   TdaP 12/01/18   HPV    Shingrix ( zoster vaccine)     ______________________  Labs Lab Result  Date comment  HIV VL 330 02/03/20   CD4 259 (25%) 02/03/20   Genotype Sensitive virus 10/29/2013 Genosure prime  [id number] NEgative 08/06/16   HIV antibody     RPR NR 02/03/20   Quantiferon Gold neg 02/03/20   Hep C ab NR 02/03/20   Hepatitis B-ab,ag,c     Hepatitis A-IgM, IgG /T     Lipid     GC/CHL          HB,PLT,Cr, LFT 14.5/218/0.99 06/01/19     Preventive  Procedure Result  Date comment  colonoscopy          Dental exam     Opthal        Impression/Recommendation ?HIV /AIDS- on Triumeq  Now has low-grade viremia Today we will do genotype testing if possible.  Told the son that he does not have a 2nd HIV infection. As the CD4 count is stable we  will continue Triumeq.  Recovered Covid infection  HTN- controlled pacemaker for symptomatic bradycardia recent   Hematuria- one episode- followed by Dr.Sninsky-he refused cystoscopy Hyperlipidemia on atorvastatin  Explained to son about adherence, compliance. [facility]   We will follow patient in 6 months.

## 2020-12-15 LAB — T-HELPER CELLS CD4/CD8 %
% CD 4 Pos. Lymph.: 19.3 % — ABNORMAL LOW (ref 30.8–58.5)
Absolute CD 4 Helper: 444 /uL (ref 359–1519)
Basophils Absolute: 0.1 10*3/uL (ref 0.0–0.2)
Basos: 1 %
CD3+CD4+ Cells/CD3+CD8+ Cells Bld: 0.55 — ABNORMAL LOW (ref 0.92–3.72)
CD3+CD8+ Cells # Bld: 803 /uL (ref 109–897)
CD3+CD8+ Cells NFr Bld: 34.9 % (ref 12.0–35.5)
EOS (ABSOLUTE): 0.2 10*3/uL (ref 0.0–0.4)
Eos: 2 %
Hematocrit: 44.1 % (ref 37.5–51.0)
Hemoglobin: 14.8 g/dL (ref 13.0–17.7)
Immature Grans (Abs): 0.1 10*3/uL (ref 0.0–0.1)
Immature Granulocytes: 1 %
Lymphocytes Absolute: 2.3 10*3/uL (ref 0.7–3.1)
Lymphs: 27 %
MCH: 29.5 pg (ref 26.6–33.0)
MCHC: 33.6 g/dL (ref 31.5–35.7)
MCV: 88 fL (ref 79–97)
Monocytes Absolute: 0.9 10*3/uL (ref 0.1–0.9)
Monocytes: 11 %
Neutrophils Absolute: 4.8 10*3/uL (ref 1.4–7.0)
Neutrophils: 58 %
Platelets: 239 10*3/uL (ref 150–450)
RBC: 5.01 x10E6/uL (ref 4.14–5.80)
RDW: 16.1 % — ABNORMAL HIGH (ref 11.6–15.4)
WBC: 8.3 10*3/uL (ref 3.4–10.8)

## 2020-12-15 LAB — HIV-1 RNA QUANT-NO REFLEX-BLD
HIV 1 RNA Quant: 40 copies/mL
LOG10 HIV-1 RNA: 1.602 log10copy/mL

## 2020-12-15 LAB — RPR: RPR Ser Ql: NONREACTIVE

## 2020-12-16 LAB — QUANTIFERON-TB GOLD PLUS (RQFGPL)
QuantiFERON Mitogen Value: >10 IU/mL
QuantiFERON Nil Value: 0.1 IU/mL
QuantiFERON TB1 Ag Value: 0.09 IU/mL
QuantiFERON TB2 Ag Value: 0.17 IU/mL

## 2020-12-16 LAB — GENOSURE PRIME (GSPRIL)

## 2020-12-16 LAB — QUANTIFERON-TB GOLD PLUS: QuantiFERON-TB Gold Plus: NEGATIVE

## 2020-12-19 ENCOUNTER — Telehealth: Payer: Self-pay

## 2020-12-19 NOTE — Telephone Encounter (Signed)
Advised labs are great no need to change meds. Also discussed that insurance requiring PA when changing pharmacy so they may need to remain with [facility] until I can get this settled with medicaid and medicare.

## 2020-12-28 MED FILL — TRIUMEQ 600-50-300 MG TABS: 600-50-300 | 30 days supply | Qty: 30 | Fill #5

## 2021-01-08 ENCOUNTER — Ambulatory Visit (INDEPENDENT_AMBULATORY_CARE_PROVIDER_SITE_OTHER): Payer: Medicare Other | Admitting: Internal Medicine

## 2021-01-08 ENCOUNTER — Encounter: Payer: Self-pay | Admitting: Internal Medicine

## 2021-01-08 ENCOUNTER — Other Ambulatory Visit: Payer: Self-pay

## 2021-01-08 VITALS — BP 149/93 | HR 76 | Ht 66.0 in | Wt 166.8 lb

## 2021-01-08 DIAGNOSIS — G4734 Idiopathic sleep related nonobstructive alveolar hypoventilation: Secondary | ICD-10-CM | POA: Diagnosis not present

## 2021-01-08 DIAGNOSIS — B20 Human immunodeficiency virus [HIV] disease: Secondary | ICD-10-CM | POA: Diagnosis not present

## 2021-01-08 DIAGNOSIS — J4520 Mild intermittent asthma, uncomplicated: Secondary | ICD-10-CM

## 2021-01-08 DIAGNOSIS — Z950 Presence of cardiac pacemaker: Secondary | ICD-10-CM

## 2021-01-08 NOTE — Assessment & Plan Note (Signed)
Sleep apnea stable at the present time. 

## 2021-01-08 NOTE — Assessment & Plan Note (Signed)
Patient denies any symptoms of wheezing.  Chest no rales or rhonchi.

## 2021-01-08 NOTE — Assessment & Plan Note (Signed)
Stable at the present time. 

## 2021-01-08 NOTE — Progress Notes (Signed)
Established Patient Office Visit  Subjective:  Patient ID: Brian [name], male    DOB: Mar 26, 1941  Age: 80 y.o. MRN: [id number]  CC:  Chief Complaint  Patient presents with  . Follow-up    HPI  Brian [name] presents for check up  Past Medical History:  Diagnosis Date  . Arthritis   . Asthma   . Bradycardia   . Depression   . HIV (human immunodeficiency virus infection) (HCC)   . Hyperlipidemia     Past Surgical History:  Procedure Laterality Date  . cataracts    . EYE SURGERY    . PACEMAKER INSERTION N/A 08/02/2015   Procedure: INSERTION PACEMAKER;  Surgeon: Corky Downs, MD;  Location: [name];  Service: Cardiovascular;  Laterality: N/A;    Family History  Problem Relation Age of Onset  . Hypertension Other     Social History   Socioeconomic History  . Marital status: Married    Spouse name: Not on file  . Number of children: Not on file  . Years of education: Not on file  . Highest education level: Not on file  Occupational History  . Not on file  Tobacco Use  . Smoking status: Never Smoker  . Smokeless tobacco: Never Used  Vaping Use  . Vaping Use: Never used  Substance and Sexual Activity  . Alcohol use: Yes    Alcohol/week: 2.0 standard drinks    Types: 2 Cans of beer per week    Comment: 1-2 drinks a day  . Drug use: No  . Sexual activity: Not on file  Other Topics Concern  . Not on file  Social History Narrative  . Not on file   Social Determinants of Health   Financial Resource Strain: Not on file  Food Insecurity: Not on file  Transportation Needs: Not on file  Physical Activity: Not on file  Stress: Not on file  Social Connections: Not on file  Intimate Partner Violence: Not on file     Current Outpatient Medications:  .  alendronate (FOSAMAX) 70 MG tablet, Take 70 mg by mouth once a week. Take on Saturday., Disp: , Rfl: 5 .  atorvastatin (LIPITOR) 40 MG tablet, Take 40 mg by mouth daily., Disp: , Rfl:  .  clonazePAM (KLONOPIN)  0.5 MG tablet, Take 0.5 mg by mouth 2 (two) times daily as needed for anxiety., Disp: , Rfl:  .  DULoxetine (CYMBALTA) 30 MG capsule, Take 30 mg by mouth daily., Disp: , Rfl:  .  dutasteride (AVODART) 0.5 MG capsule, Take 1 capsule (0.5 mg total) by mouth daily., Disp: 30 capsule, Rfl: 11 .  loratadine (CLARITIN) 10 MG tablet, Take 10 mg by mouth daily., Disp: , Rfl:  .  metoprolol succinate (TOPROL-XL) 25 MG 24 hr tablet, Take 1 tablet (25 mg total) by mouth daily., Disp: 90 tablet, Rfl: 3 .  omeprazole (PRILOSEC) 20 MG capsule, Take 20 mg by mouth daily., Disp: , Rfl: 5 .  tamsulosin (FLOMAX) 0.4 MG CAPS capsule, Take 1 capsule (0.4 mg total) by mouth daily., Disp: 30 capsule, Rfl: 11 .  TRIUMEQ 600-50-300 MG tablet, Take 1 tablet by mouth daily., Disp: 30 tablet, Rfl: 6 .  VENTOLIN HFA 108 (90 BASE) MCG/ACT inhaler, Inhale 2 puffs into the lungs 4 (four) times daily as needed. For shortness of breath and/or wheezing., Disp: , Rfl: 5   No Known Allergies  ROS Review of Systems  Constitutional: Negative.   HENT: Negative.   Eyes: Negative.  Respiratory: Negative.   Cardiovascular: Negative.   Gastrointestinal: Negative.   Endocrine: Negative.   Genitourinary: Negative.   Musculoskeletal: Negative.   Skin: Negative.   Allergic/Immunologic: Negative.   Neurological: Negative.   Hematological: Negative.   Psychiatric/Behavioral: Negative.   All other systems reviewed and are negative.     Objective:    Physical Exam Vitals reviewed.  Constitutional:      Appearance: Normal appearance.  HENT:     Mouth/Throat:     Mouth: Mucous membranes are moist.  Eyes:     Pupils: Pupils are equal, round, and reactive to light.  Neck:     Vascular: No carotid bruit.  Cardiovascular:     Rate and Rhythm: Normal rate and regular rhythm.     Pulses: Normal pulses.     Heart sounds: Normal heart sounds.  Pulmonary:     Effort: Pulmonary effort is normal.     Breath sounds: Normal  breath sounds.  Abdominal:     General: Bowel sounds are normal.     Palpations: Abdomen is soft. There is no hepatomegaly, splenomegaly or mass.     Tenderness: There is no abdominal tenderness.     Hernia: No hernia is present.  Musculoskeletal:     Cervical back: Neck supple.     Right lower leg: No edema.     Left lower leg: No edema.  Skin:    Findings: No rash.  Neurological:     Mental Status: He is alert and oriented to person, place, and time.     Motor: No weakness.  Psychiatric:        Mood and Affect: Mood normal.        Behavior: Behavior normal.     BP (!) 149/93   Pulse 76   Ht 5\' 6"  (1.676 m)   Wt 166 lb 12.8 oz (75.7 kg)   BMI 26.92 kg/m  Wt Readings from Last 3 Encounters:  01/08/21 166 lb 12.8 oz (75.7 kg)  12/14/20 169 lb (76.7 kg)  10/25/20 165 lb (74.8 kg)     Health Maintenance Due  Topic Date Due  . COVID-19 Vaccine (1) Never done  . INFLUENZA VACCINE  05/28/2020    There are no preventive care reminders to display for this patient.  Lab Results  Component Value Date   TSH 0.532 10/16/2014   Lab Results  Component Value Date   WBC 8.3 12/14/2020   HGB 14.8 12/14/2020   HCT 44.1 12/14/2020   MCV 88 12/14/2020   PLT 239 12/14/2020   Lab Results  Component Value Date   NA 135 12/14/2020   K 3.6 12/14/2020   CO2 20 (L) 12/14/2020   GLUCOSE 104 (H) 12/14/2020   BUN 24 (H) 12/14/2020   CREATININE 1.17 12/14/2020   BILITOT 0.7 12/14/2020   ALKPHOS 40 12/14/2020   AST 30 12/14/2020   ALT 22 12/14/2020   PROT 8.2 (H) 12/14/2020   ALBUMIN 3.7 12/14/2020   CALCIUM 9.0 12/14/2020   ANIONGAP 11 12/14/2020   Lab Results  Component Value Date   CHOL 212 (H) 12/14/2020   Lab Results  Component Value Date   HDL 40 (L) 12/14/2020   Lab Results  Component Value Date   LDLCALC 117 (H) 12/14/2020   Lab Results  Component Value Date   TRIG 275 (H) 12/14/2020   Lab Results  Component Value Date   CHOLHDL 5.3 12/14/2020   No  results found for: HGBA1C    Assessment &  Plan:   Problem List Items Addressed This Visit      Respiratory   Bronchial asthma (Chronic)    Patient denies any symptoms of wheezing.  Chest no rales or rhonchi.      Sleep apnea - Primary (Chronic)    Sleep apnea stable at the present time        Other   HIV (human immunodeficiency virus infection) (HCC) (Chronic)    Stable at the present time.      Cardiac pacemaker    Patient is scheduled to have his pacemaker checked in May of this year         No orders of the defined types were placed in this encounter.   Follow-up: No follow-ups on file.    Corky Downs, MD

## 2021-01-08 NOTE — Assessment & Plan Note (Signed)
Patient is scheduled to have his pacemaker checked in May of this year

## 2021-01-26 ENCOUNTER — Other Ambulatory Visit (HOSPITAL_COMMUNITY): Payer: Self-pay

## 2021-01-31 ENCOUNTER — Other Ambulatory Visit (HOSPITAL_COMMUNITY): Payer: Self-pay

## 2021-01-31 MED FILL — Abacavir-Dolutegravir-Lamivudine Tab 600-50-300 MG: ORAL | 30 days supply | Qty: 30 | Fill #0 | Status: AC

## 2021-02-20 NOTE — Telephone Encounter (Signed)
error 

## 2021-03-01 ENCOUNTER — Other Ambulatory Visit (HOSPITAL_COMMUNITY): Payer: Self-pay

## 2021-03-05 ENCOUNTER — Encounter: Payer: Self-pay | Admitting: Internal Medicine

## 2021-03-05 ENCOUNTER — Ambulatory Visit (INDEPENDENT_AMBULATORY_CARE_PROVIDER_SITE_OTHER): Payer: Medicare Other | Admitting: Internal Medicine

## 2021-03-05 VITALS — BP 151/96 | HR 80

## 2021-03-05 DIAGNOSIS — B20 Human immunodeficiency virus [HIV] disease: Secondary | ICD-10-CM

## 2021-03-05 DIAGNOSIS — Z950 Presence of cardiac pacemaker: Secondary | ICD-10-CM | POA: Diagnosis not present

## 2021-03-05 DIAGNOSIS — G4734 Idiopathic sleep related nonobstructive alveolar hypoventilation: Secondary | ICD-10-CM

## 2021-03-05 DIAGNOSIS — J449 Chronic obstructive pulmonary disease, unspecified: Secondary | ICD-10-CM

## 2021-03-05 DIAGNOSIS — R55 Syncope and collapse: Secondary | ICD-10-CM

## 2021-03-05 DIAGNOSIS — I10 Essential (primary) hypertension: Secondary | ICD-10-CM | POA: Diagnosis not present

## 2021-03-05 NOTE — Assessment & Plan Note (Signed)
Patient blood pressure is elevated patient denies any chest pain or shortness of breath there is no history of palpitation or paroxysmal nocturnal dyspnea   patient was advised to follow low-salt low-cholesterol diet    ideally I want to keep systolic blood pressure below 130 mmHg, patient was asked to check blood pressure one times a week and give me a report on that.  Patient will be follow-up in 3 months  or earlier as needed, patient will call me back for any change in the cardiovascular symptoms    

## 2021-03-05 NOTE — Assessment & Plan Note (Signed)
Chest examination does not reveal any wheezing °

## 2021-03-05 NOTE — Assessment & Plan Note (Signed)
Uses  Oxygen at night

## 2021-03-05 NOTE — Assessment & Plan Note (Signed)
H/o near  Passing out  5 months ago

## 2021-03-05 NOTE — Assessment & Plan Note (Signed)
Pt  Is on treatment  For HIV

## 2021-03-05 NOTE — Progress Notes (Signed)
Established Patient Office Visit  Subjective:  Patient ID: Brian [name], male    DOB: Dec 11, 1940  Age: 80 y.o. MRN: [id number]  CC:  Chief Complaint  Patient presents with  . Pacemaker Check    HPI  Brian [name] presents for pacer check and office visit.  Patient has a pacemaker placed on 08/02/2015.  He is known to have hyperlipidemia also has been treated for HIV infection by infectious diseases in the hospital.  He denies any chest pain or shortness of breath.  Past Medical History:  Diagnosis Date  . Arthritis   . Asthma   . Bradycardia   . Depression   . HIV (human immunodeficiency virus infection) ([facility])   . Hyperlipidemia     Past Surgical History:  Procedure Laterality Date  . cataracts    . EYE SURGERY    . PACEMAKER INSERTION N/A 08/02/2015   Procedure: INSERTION PACEMAKER;  Surgeon: Corky Downs, MD;  Location: [name];  Service: Cardiovascular;  Laterality: N/A;    Family History  Problem Relation Age of Onset  . Hypertension Other     Social History   Socioeconomic History  . Marital status: Married    Spouse name: Not on file  . Number of children: Not on file  . Years of education: Not on file  . Highest education level: Not on file  Occupational History  . Not on file  Tobacco Use  . Smoking status: Never Smoker  . Smokeless tobacco: Never Used  Vaping Use  . Vaping Use: Never used  Substance and Sexual Activity  . Alcohol use: Yes    Alcohol/week: 2.0 standard drinks    Types: 2 Cans of beer per week    Comment: 1-2 drinks a day  . Drug use: No  . Sexual activity: Not on file  Other Topics Concern  . Not on file  Social History Narrative  . Not on file   Social Determinants of Health   Financial Resource Strain: Not on file  Food Insecurity: Not on file  Transportation Needs: Not on file  Physical Activity: Not on file  Stress: Not on file  Social Connections: Not on file  Intimate Partner Violence: Not on file     Current  Outpatient Medications:  .  abacavir-dolutegravir-lamiVUDine (TRIUMEQ) 600-50-300 MG tablet, TAKE 1 TABLET BY MOUTH ONCE A DAY., Disp: 30 tablet, Rfl: 6 .  alendronate (FOSAMAX) 70 MG tablet, Take 70 mg by mouth once a week. Take on Saturday., Disp: , Rfl: 5 .  atorvastatin (LIPITOR) 40 MG tablet, Take 40 mg by mouth daily., Disp: , Rfl:  .  clonazePAM (KLONOPIN) 0.5 MG tablet, Take 0.5 mg by mouth 2 (two) times daily as needed for anxiety., Disp: , Rfl:  .  DULoxetine (CYMBALTA) 30 MG capsule, Take 30 mg by mouth daily., Disp: , Rfl:  .  dutasteride (AVODART) 0.5 MG capsule, Take 1 capsule (0.5 mg total) by mouth daily., Disp: 30 capsule, Rfl: 11 .  loratadine (CLARITIN) 10 MG tablet, Take 10 mg by mouth daily., Disp: , Rfl:  .  metoprolol succinate (TOPROL-XL) 25 MG 24 hr tablet, Take 1 tablet (25 mg total) by mouth daily., Disp: 90 tablet, Rfl: 3 .  omeprazole (PRILOSEC) 20 MG capsule, Take 20 mg by mouth daily., Disp: , Rfl: 5 .  tamsulosin (FLOMAX) 0.4 MG CAPS capsule, Take 1 capsule (0.4 mg total) by mouth daily., Disp: 30 capsule, Rfl: 11 .  TRIUMEQ 600-50-300 MG tablet, Take 1 tablet  by mouth daily., Disp: 30 tablet, Rfl: 6 .  VENTOLIN HFA 108 (90 BASE) MCG/ACT inhaler, Inhale 2 puffs into the lungs 4 (four) times daily as needed. For shortness of breath and/or wheezing., Disp: , Rfl: 5   No Known Allergies  ROS Review of Systems  Constitutional: Negative.   HENT: Negative.   Eyes: Negative.   Respiratory: Negative.   Cardiovascular: Negative.   Gastrointestinal: Negative.   Endocrine: Negative.   Genitourinary: Negative.   Musculoskeletal: Negative.   Skin: Negative.   Allergic/Immunologic: Negative.   Neurological: Negative.   Hematological: Negative.   Psychiatric/Behavioral: Negative.   All other systems reviewed and are negative.     Objective:    Physical Exam Vitals reviewed.  Constitutional:      Appearance: Normal appearance.  HENT:     Mouth/Throat:      Mouth: Mucous membranes are moist.  Eyes:     Pupils: Pupils are equal, round, and reactive to light.  Neck:     Vascular: No carotid bruit.  Cardiovascular:     Rate and Rhythm: Normal rate and regular rhythm.     Pulses: Normal pulses.     Heart sounds: Normal heart sounds.  Pulmonary:     Effort: Pulmonary effort is normal.     Breath sounds: Normal breath sounds.  Abdominal:     General: Bowel sounds are normal.     Palpations: Abdomen is soft. There is no hepatomegaly, splenomegaly or mass.     Tenderness: There is no abdominal tenderness.     Hernia: No hernia is present.  Musculoskeletal:     Cervical back: Neck supple.     Right lower leg: No edema.     Left lower leg: No edema.  Skin:    Findings: No rash.  Neurological:     Mental Status: He is alert and oriented to person, place, and time.     Motor: No weakness.  Psychiatric:        Mood and Affect: Mood normal.        Behavior: Behavior normal.     BP (!) 151/96   Pulse 80  Wt Readings from Last 3 Encounters:  01/08/21 166 lb 12.8 oz (75.7 kg)  12/14/20 169 lb (76.7 kg)  10/25/20 165 lb (74.8 kg)     Health Maintenance Due  Topic Date Due  . COVID-19 Vaccine (1) Never done    There are no preventive care reminders to display for this patient.  Lab Results  Component Value Date   TSH 0.532 10/16/2014   Lab Results  Component Value Date   WBC 8.3 12/14/2020   HGB 14.8 12/14/2020   HCT 44.1 12/14/2020   MCV 88 12/14/2020   PLT 239 12/14/2020   Lab Results  Component Value Date   NA 135 12/14/2020   K 3.6 12/14/2020   CO2 20 (L) 12/14/2020   GLUCOSE 104 (H) 12/14/2020   BUN 24 (H) 12/14/2020   CREATININE 1.17 12/14/2020   BILITOT 0.7 12/14/2020   ALKPHOS 40 12/14/2020   AST 30 12/14/2020   ALT 22 12/14/2020   PROT 8.2 (H) 12/14/2020   ALBUMIN 3.7 12/14/2020   CALCIUM 9.0 12/14/2020   ANIONGAP 11 12/14/2020   Lab Results  Component Value Date   CHOL 212 (H) 12/14/2020   Lab  Results  Component Value Date   HDL 40 (L) 12/14/2020   Lab Results  Component Value Date   LDLCALC 117 (H) 12/14/2020   Lab  Results  Component Value Date   TRIG 275 (H) 12/14/2020   Lab Results  Component Value Date   CHOLHDL 5.3 12/14/2020   No results found for: HGBA1C    Assessment & Plan:   Problem List Items Addressed This Visit      Cardiovascular and Mediastinum   Essential hypertension    Patient blood pressure is elevated patient denies any chest pain or shortness of breath there is no history of palpitation or paroxysmal nocturnal dyspnea   patient was advised to follow low-salt low-cholesterol diet    ideally I want to keep systolic blood pressure below 709 mmHg, patient was asked to check blood pressure one times a week and give me a report on that.  Patient will be follow-up in 3 months  or earlier as needed, patient will call me back for any change in the cardiovascular symptoms           Respiratory   Sleep apnea (Chronic)    Uses  Oxygen at night      COPD (chronic obstructive pulmonary disease) (HCC)    Chest examination does not reveal any wheezing.        Other   HIV (human immunodeficiency virus infection) (HCC) (Chronic)    Pt  Is on treatment  For HIV      Syncope    H/o near  Passing out  5 months ago      Cardiac pacemaker - Primary   Relevant Orders   PACEMAKER IN CLINIC CHECK   EKG 12-Lead   DG Chest 2 View    Patient is functioning only 2% of the time.  Right ventricular lead function is impaired.  Estimated battery life is 6 years.  Because of dysfunction of the RV lead I will get a opinion from Dr. Lalla Brothers  we will get a chest x-ray to see the position of the lead.   No orders of the defined types were placed in this encounter. Electrocardiogram report. .  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    Corky Downs, MD

## 2021-03-06 ENCOUNTER — Other Ambulatory Visit: Payer: Self-pay | Admitting: Infectious Diseases

## 2021-03-06 ENCOUNTER — Other Ambulatory Visit (HOSPITAL_COMMUNITY): Payer: Self-pay

## 2021-03-07 ENCOUNTER — Other Ambulatory Visit (HOSPITAL_COMMUNITY): Payer: Self-pay

## 2021-03-08 ENCOUNTER — Other Ambulatory Visit (HOSPITAL_COMMUNITY): Payer: Self-pay

## 2021-03-08 ENCOUNTER — Ambulatory Visit
Admission: RE | Admit: 2021-03-08 | Discharge: 2021-03-08 | Disposition: A | Discharge status: Home / Self Care | Payer: Medicare Other | Attending: Internal Medicine | Admitting: Internal Medicine

## 2021-03-08 ENCOUNTER — Other Ambulatory Visit: Payer: Self-pay

## 2021-03-08 ENCOUNTER — Other Ambulatory Visit: Payer: Self-pay | Admitting: Infectious Diseases

## 2021-03-08 ENCOUNTER — Ambulatory Visit
Admission: RE | Admit: 2021-03-08 | Discharge: 2021-03-08 | Disposition: A | Discharge status: Home / Self Care | Payer: Medicare Other | Source: Ambulatory Visit | Attending: Internal Medicine | Admitting: Internal Medicine

## 2021-03-08 DIAGNOSIS — Z950 Presence of cardiac pacemaker: Secondary | ICD-10-CM | POA: Diagnosis present

## 2021-03-08 NOTE — Addendum Note (Signed)
Addended by: Jobie Quaker on: 03/08/2021 09:33 AM   Modules accepted: Orders

## 2021-03-12 ENCOUNTER — Other Ambulatory Visit: Payer: Self-pay | Admitting: Infectious Diseases

## 2021-03-12 ENCOUNTER — Other Ambulatory Visit (HOSPITAL_COMMUNITY): Payer: Self-pay

## 2021-03-13 ENCOUNTER — Other Ambulatory Visit (HOSPITAL_COMMUNITY): Payer: Self-pay

## 2021-03-14 ENCOUNTER — Encounter: Payer: Self-pay | Admitting: Internal Medicine

## 2021-03-14 ENCOUNTER — Ambulatory Visit (INDEPENDENT_AMBULATORY_CARE_PROVIDER_SITE_OTHER): Payer: Medicare Other | Admitting: Internal Medicine

## 2021-03-14 ENCOUNTER — Other Ambulatory Visit: Payer: Self-pay

## 2021-03-14 VITALS — BP 147/90 | HR 82 | Ht 66.0 in | Wt 166.0 lb

## 2021-03-14 DIAGNOSIS — G6289 Other specified polyneuropathies: Secondary | ICD-10-CM

## 2021-03-14 DIAGNOSIS — G4734 Idiopathic sleep related nonobstructive alveolar hypoventilation: Secondary | ICD-10-CM | POA: Diagnosis not present

## 2021-03-14 DIAGNOSIS — I10 Essential (primary) hypertension: Secondary | ICD-10-CM

## 2021-03-14 DIAGNOSIS — R9389 Abnormal findings on diagnostic imaging of other specified body structures: Secondary | ICD-10-CM | POA: Diagnosis not present

## 2021-03-14 DIAGNOSIS — B20 Human immunodeficiency virus [HIV] disease: Secondary | ICD-10-CM

## 2021-03-14 DIAGNOSIS — Z950 Presence of cardiac pacemaker: Secondary | ICD-10-CM

## 2021-03-14 NOTE — Progress Notes (Signed)
amb pulm  

## 2021-03-15 ENCOUNTER — Encounter: Payer: Self-pay | Admitting: Internal Medicine

## 2021-03-15 NOTE — Assessment & Plan Note (Signed)
Patient is on HIV treatment.

## 2021-03-15 NOTE — Assessment & Plan Note (Signed)
Stable at the present time. 

## 2021-03-15 NOTE — Progress Notes (Signed)
Established Patient Office Visit  Subjective:  Patient ID: Brian [name], male    DOB: 04-18-1941  Age: 80 y.o. MRN: [id number]  CC:  Chief Complaint  Patient presents with  . chest xray results    HPI  Brian [name] presents for evaluation of the abnormal chest x-ray.  Past Medical History:  Diagnosis Date  . Arthritis   . Asthma   . Bradycardia   . Depression   . HIV (human immunodeficiency virus infection) (HCC)   . Hyperlipidemia     Past Surgical History:  Procedure Laterality Date  . cataracts    . EYE SURGERY    . PACEMAKER INSERTION N/A 08/02/2015   Procedure: INSERTION PACEMAKER;  Surgeon: Corky Downs, MD;  Location: [name];  Service: Cardiovascular;  Laterality: N/A;    Family History  Problem Relation Age of Onset  . Hypertension Other     Social History   Socioeconomic History  . Marital status: Married    Spouse name: Not on file  . Number of children: Not on file  . Years of education: Not on file  . Highest education level: Not on file  Occupational History  . Not on file  Tobacco Use  . Smoking status: Never Smoker  . Smokeless tobacco: Never Used  Vaping Use  . Vaping Use: Never used  Substance and Sexual Activity  . Alcohol use: Yes    Alcohol/week: 2.0 standard drinks    Types: 2 Cans of beer per week    Comment: 1-2 drinks a day  . Drug use: No  . Sexual activity: Not on file  Other Topics Concern  . Not on file  Social History Narrative  . Not on file   Social Determinants of Health   Financial Resource Strain: Not on file  Food Insecurity: Not on file  Transportation Needs: Not on file  Physical Activity: Not on file  Stress: Not on file  Social Connections: Not on file  Intimate Partner Violence: Not on file     Current Outpatient Medications:  .  abacavir-dolutegravir-lamiVUDine (TRIUMEQ) 600-50-300 MG tablet, TAKE 1 TABLET BY MOUTH ONCE A DAY., Disp: 30 tablet, Rfl: 6 .  alendronate (FOSAMAX) 70 MG tablet, Take  70 mg by mouth once a week. Take on Saturday., Disp: , Rfl: 5 .  atorvastatin (LIPITOR) 40 MG tablet, Take 40 mg by mouth daily., Disp: , Rfl:  .  clonazePAM (KLONOPIN) 0.5 MG tablet, Take 0.5 mg by mouth 2 (two) times daily as needed for anxiety., Disp: , Rfl:  .  DULoxetine (CYMBALTA) 30 MG capsule, Take 30 mg by mouth daily., Disp: , Rfl:  .  dutasteride (AVODART) 0.5 MG capsule, Take 1 capsule (0.5 mg total) by mouth daily., Disp: 30 capsule, Rfl: 11 .  loratadine (CLARITIN) 10 MG tablet, Take 10 mg by mouth daily., Disp: , Rfl:  .  metoprolol succinate (TOPROL-XL) 25 MG 24 hr tablet, Take 1 tablet (25 mg total) by mouth daily., Disp: 90 tablet, Rfl: 3 .  omeprazole (PRILOSEC) 20 MG capsule, Take 20 mg by mouth daily., Disp: , Rfl: 5 .  tamsulosin (FLOMAX) 0.4 MG CAPS capsule, Take 1 capsule (0.4 mg total) by mouth daily., Disp: 30 capsule, Rfl: 11 .  TRIUMEQ 600-50-300 MG tablet, Take 1 tablet by mouth daily., Disp: 30 tablet, Rfl: 6 .  VENTOLIN HFA 108 (90 BASE) MCG/ACT inhaler, Inhale 2 puffs into the lungs 4 (four) times daily as needed. For shortness of breath and/or wheezing., Disp: ,  Rfl: 5   No Known Allergies  ROS Review of Systems  Constitutional: Negative.   HENT: Negative.   Eyes: Negative.   Respiratory: Negative.   Cardiovascular: Negative.   Gastrointestinal: Negative.   Endocrine: Negative.   Genitourinary: Negative.   Musculoskeletal: Negative.   Skin: Negative.   Allergic/Immunologic: Negative.   Neurological: Negative.   Hematological: Negative.   Psychiatric/Behavioral: Negative.   All other systems reviewed and are negative.     Objective:    Physical Exam Vitals reviewed.  Constitutional:      Appearance: Normal appearance.  HENT:     Mouth/Throat:     Mouth: Mucous membranes are moist.  Eyes:     Pupils: Pupils are equal, round, and reactive to light.  Neck:     Vascular: No carotid bruit.  Cardiovascular:     Rate and Rhythm: Normal rate and  regular rhythm.     Pulses: Normal pulses.     Heart sounds: Normal heart sounds.  Pulmonary:     Effort: Pulmonary effort is normal.     Breath sounds: Normal breath sounds.  Abdominal:     General: Bowel sounds are normal.     Palpations: Abdomen is soft. There is no hepatomegaly, splenomegaly or mass.     Tenderness: There is no abdominal tenderness.     Hernia: No hernia is present.  Musculoskeletal:     Cervical back: Neck supple.     Right lower leg: No edema.     Left lower leg: No edema.  Skin:    Findings: No rash.  Neurological:     Mental Status: He is alert and oriented to person, place, and time.     Motor: No weakness.  Psychiatric:        Mood and Affect: Mood normal.        Behavior: Behavior normal.     BP (!) 147/90   Pulse 82   Ht 5\' 6"  (1.676 m)   Wt 166 lb (75.3 kg)   BMI 26.79 kg/m  Wt Readings from Last 3 Encounters:  03/14/21 166 lb (75.3 kg)  01/08/21 166 lb 12.8 oz (75.7 kg)  12/14/20 169 lb (76.7 kg)     Health Maintenance Due  Topic Date Due  . COVID-19 Vaccine (1) Never done    There are no preventive care reminders to display for this patient.  Lab Results  Component Value Date   TSH 0.532 10/16/2014   Lab Results  Component Value Date   WBC 8.3 12/14/2020   HGB 14.8 12/14/2020   HCT 44.1 12/14/2020   MCV 88 12/14/2020   PLT 239 12/14/2020   Lab Results  Component Value Date   NA 135 12/14/2020   K 3.6 12/14/2020   CO2 20 (L) 12/14/2020   GLUCOSE 104 (H) 12/14/2020   BUN 24 (H) 12/14/2020   CREATININE 1.17 12/14/2020   BILITOT 0.7 12/14/2020   ALKPHOS 40 12/14/2020   AST 30 12/14/2020   ALT 22 12/14/2020   PROT 8.2 (H) 12/14/2020   ALBUMIN 3.7 12/14/2020   CALCIUM 9.0 12/14/2020   ANIONGAP 11 12/14/2020   Lab Results  Component Value Date   CHOL 212 (H) 12/14/2020   Lab Results  Component Value Date   HDL 40 (L) 12/14/2020   Lab Results  Component Value Date   LDLCALC 117 (H) 12/14/2020   Lab Results   Component Value Date   TRIG 275 (H) 12/14/2020   Lab Results  Component Value  Date   CHOLHDL 5.3 12/14/2020   No results found for: HGBA1C    Assessment & Plan:   Problem List Items Addressed This Visit      Cardiovascular and Mediastinum   Essential hypertension    Blood pressure is stable.        Respiratory   Sleep apnea (Chronic)    Stable.  We will send the patient to lung specialist.  Patient has an abnormal chest x-ray we will get a CT scan before the consultation, patient also has a consultation with EP specialist at [facility].        Nervous and Auditory   Peripheral neuropathy    Stable at the present time.        Other   HIV (human immunodeficiency virus infection) (HCC) (Chronic)    Patient is on HIV treatment.      Cardiac pacemaker    Refer to EP specialist for evaluation of arrhythmia and evaluate the pacemaker leads.       Other Visit Diagnoses    Abnormal chest x-ray    -  Primary   Relevant Orders   Ambulatory referral to Pulmonology   Abnormal chest xray          No orders of the defined types were placed in this encounter.   Follow-up: No follow-ups on file.    Corky Downs, MD

## 2021-03-15 NOTE — Assessment & Plan Note (Signed)
Refer to EP specialist for evaluation of arrhythmia and evaluate the pacemaker leads.

## 2021-03-15 NOTE — Assessment & Plan Note (Signed)
Stable.  We will send the patient to lung specialist.  Patient has an abnormal chest x-ray we will get a CT scan before the consultation, patient also has a consultation with EP specialist at [facility].

## 2021-03-15 NOTE — Assessment & Plan Note (Signed)
Blood pressure is stable 

## 2021-03-19 ENCOUNTER — Other Ambulatory Visit: Payer: Self-pay | Admitting: Pharmacist

## 2021-03-19 ENCOUNTER — Other Ambulatory Visit: Payer: Self-pay | Admitting: Infectious Diseases

## 2021-03-19 ENCOUNTER — Other Ambulatory Visit (HOSPITAL_COMMUNITY): Payer: Self-pay

## 2021-03-19 DIAGNOSIS — B20 Human immunodeficiency virus [HIV] disease: Secondary | ICD-10-CM

## 2021-03-19 MED ORDER — ABACAVIR-DOLUTEGRAVIR-LAMIVUD 600-50-300 MG PO TABS
1.0000 | ORAL_TABLET | Freq: Every day | ORAL | 5 refills | Status: DC
  Filled 2021-03-19: qty 30, 30d supply, fill #0
  Filled 2021-04-04: qty 30, 30d supply, fill #1
  Filled 2021-05-01: qty 30, 30d supply, fill #2
  Filled 2021-05-30: qty 30, 30d supply, fill #3
  Filled 2021-07-05: qty 30, 30d supply, fill #4
  Filled 2021-08-01: qty 30, 30d supply, fill #5

## 2021-03-19 NOTE — Progress Notes (Signed)
Patient needing refills. Sending to [facility].

## 2021-03-28 ENCOUNTER — Other Ambulatory Visit (HOSPITAL_COMMUNITY): Payer: Self-pay

## 2021-04-04 ENCOUNTER — Other Ambulatory Visit (HOSPITAL_COMMUNITY): Payer: Self-pay

## 2021-04-06 ENCOUNTER — Other Ambulatory Visit (HOSPITAL_COMMUNITY): Payer: Self-pay

## 2021-04-10 ENCOUNTER — Other Ambulatory Visit (HOSPITAL_COMMUNITY): Payer: Self-pay

## 2021-04-10 NOTE — Progress Notes (Signed)
Electrophysiology Office Note:    Date:  04/11/2021   ID:  Brian [name], DOB 12/19/1940, MRN [id number]  PCP:  Armando Gang, FNP  [facility] Cardiologist:  None  [facility] Electrophysiologist:  Lanier Prude, MD   Referring MD: Corky Downs, MD   Chief Complaint: Pacemaker lead malfunction  History of Present Illness:    Brian [name] is a 80 y.o. male who presents for an evaluation of pacemaker lead malfunction at the request of Dr. Harl Bowie. Their medical history includes symptomatic bradycardia post permanent pacemaker implant in 2016, HIV, hyperlipidemia and depression.  The patient is here today with his son and daughter.      Past Medical History:  Diagnosis Date   Arthritis    Asthma    Bradycardia    Depression    HIV (human immunodeficiency virus infection) (HCC)    Hyperlipidemia     Past Surgical History:  Procedure Laterality Date   cataracts     EYE SURGERY     PACEMAKER INSERTION N/A 08/02/2015   Procedure: INSERTION PACEMAKER;  Surgeon: Corky Downs, MD;  Location: [name];  Service: Cardiovascular;  Laterality: N/A;    Current Medications: Current Meds  Medication Sig   abacavir-dolutegravir-lamiVUDine (TRIUMEQ) 600-50-300 MG tablet Take 1 tablet by mouth daily.   alendronate (FOSAMAX) 70 MG tablet Take 70 mg by mouth once a week. Take on Saturday.   atorvastatin (LIPITOR) 40 MG tablet Take 40 mg by mouth daily.   azelastine (OPTIVAR) 0.05 % ophthalmic solution Apply 1 drop to eye 2 (two) times daily.   clonazePAM (KLONOPIN) 0.5 MG tablet Take 0.5 mg by mouth 2 (two) times daily as needed for anxiety.   DULoxetine (CYMBALTA) 30 MG capsule Take 30 mg by mouth daily.   dutasteride (AVODART) 0.5 MG capsule Take 1 capsule (0.5 mg total) by mouth daily.   loratadine (CLARITIN) 10 MG tablet Take 10 mg by mouth daily.   metoprolol succinate (TOPROL-XL) 25 MG 24 hr tablet Take 1 tablet (25 mg total) by mouth daily.   omeprazole (PRILOSEC) 20  MG capsule Take 20 mg by mouth daily.   tamsulosin (FLOMAX) 0.4 MG CAPS capsule Take 1 capsule (0.4 mg total) by mouth daily.   traMADol-acetaminophen (ULTRACET) 37.5-325 MG tablet Take 1 tablet by mouth daily as needed.   VENTOLIN HFA 108 (90 BASE) MCG/ACT inhaler Inhale 2 puffs into the lungs 4 (four) times daily as needed. For shortness of breath and/or wheezing.   [DISCONTINUED] TRIUMEQ 600-50-300 MG tablet Take 1 tablet by mouth daily.     Allergies:   Patient has no known allergies.   Social History   Socioeconomic History   Marital status: Married    Spouse name: Not on file   Number of children: Not on file   Years of education: Not on file   Highest education level: Not on file  Occupational History   Not on file  Tobacco Use   Smoking status: Never   Smokeless tobacco: Never  Vaping Use   Vaping Use: Never used  Substance and Sexual Activity   Alcohol use: Yes    Alcohol/week: 2.0 standard drinks    Types: 2 Cans of beer per week    Comment: 1-2 drinks a day   Drug use: No   Sexual activity: Not on file  Other Topics Concern   Not on file  Social History Narrative   Not on file   Social Determinants of Health   Financial Resource Strain:  Not on file  Food Insecurity: Not on file  Transportation Needs: Not on file  Physical Activity: Not on file  Stress: Not on file  Social Connections: Not on file     Family History: The patient's family history includes Hypertension in an other family member.  ROS:   Please see the history of present illness.    All other systems reviewed and are negative.  EKGs/Labs/Other Studies Reviewed:    The following studies were reviewed today:  July 31, 2015 echo LV function normal, 55% RV function normal No significant valvular abnormalities   Mar 05, 2021 device interrogation reviewed from Dr. Renie Ora office Dual-chamber pacemaker implanted in October 2016 RV lead parameters are unstable.  Capture threshold  fluctuates and is now significantly elevated above 3 mA Looks like he ventricularly paces around 2% of the time although it is hard to know whether or not that is true with the dysfunctional RV lead.  Currently, the RV lead is programmed at 5 mA of output  April 11, 2021 in clinic device interrogation personally reviewed Battery longevity estimated at 6 years V pacing 2.9% Atrially pacing 32.7% Atrial lead sensing 1.4-2.8 Ventricular lead sensing 5.6-16 Impedance stable Ventricular lead threshold 3.5 V at 1 ms Atrial lead threshold 0.75 V at 0.4 ms Ventricular lead sensing threshold in the office 11.2 to 15.6 mV 1 ventricular high rate episode on Mar 08, 2021 lasting 6 seconds with a ventricular rate of 202 bpm.  03/08/2021 CXR  Right atrial lead course appropriate.  Right ventricular lead has a posterior trajectory out of the SVC and seems to course anteriorly towards the base of the ventricle.  04/02/2017 CT Angio chest      Selected images above show right ventricular lead coursing posteriorly to the right atrium.  The lead appears to enter the coronary sinus main ostium and then likely down the middle cardiac vein although the motion artifact limits definitive placement of the lead tip.  EKG:  The ekg ordered today demonstrates sinus rhythm.  Recent Labs: 12/14/2020: ALT 22; BUN 24; Creatinine, Ser 1.17; Hemoglobin 14.8; Platelets 239; Potassium 3.6; Sodium 135  Recent Lipid Panel    Component Value Date/Time   CHOL 212 (H) 12/14/2020 1143   CHOL 124 10/16/2014 0405   TRIG 275 (H) 12/14/2020 1143   TRIG 158 10/16/2014 0405   HDL 40 (L) 12/14/2020 1143   HDL 35 (L) 10/16/2014 0405   CHOLHDL 5.3 12/14/2020 1143   VLDL 55 (H) 12/14/2020 1143   VLDL 32 10/16/2014 0405   LDLCALC 117 (H) 12/14/2020 1143   LDLCALC 57 10/16/2014 0405    Physical Exam:    VS:  BP 130/88 (BP Location: Left Arm, Patient Position: Sitting, Cuff Size: Normal)   Pulse 80   Ht 5\' 6"  (1.676 m)   Wt  163 lb (73.9 kg)   SpO2 97%   BMI 26.31 kg/m     Wt Readings from Last 3 Encounters:  04/11/21 163 lb (73.9 kg)  03/14/21 166 lb (75.3 kg)  01/08/21 166 lb 12.8 oz (75.7 kg)     GEN:  Well nourished, well developed in no acute distress HEENT: Normal NECK: No JVD; No carotid bruits LYMPHATICS: No lymphadenopathy CARDIAC: RRR, no murmurs, rubs, gallops.  Left-sided pacemaker pocket well-healed. RESPIRATORY:  Clear to auscultation without rales, wheezing or rhonchi  ABDOMEN: Soft, non-tender, non-distended MUSCULOSKELETAL:  No edema; No deformity  SKIN: Warm and dry NEUROLOGIC:  Alert and oriented x 3 PSYCHIATRIC:  Normal  affect   ASSESSMENT:    1. Symptomatic bradycardia   2. Cardiac pacemaker   3. HIV infection, unspecified symptom status (HCC)   4. Failure of pacemaker lead, initial encounter    PLAN:    In order of problems listed above:  Symptomatic bradycardia post permanent pacemaker implant in 2016 Patient has a very low ventricular pacing burden. He does atrially pace approximately 33% of the time with a lower rate set at 60 bpm.  I suspect he will benefit long-term from having a functional atrial lead.   Review of historical imaging suggest that the location of the ventricular lead is likely within the middle cardiac vein.  This will likely limit my ability to safely extract the lead.  Although not protocoled to accurately assess vein patency, the CT angio reviewed above suggests the left subclavian venous system was patent at the time of the study.  Review of today's EKG suggests intact AV conduction.    We discussed various options moving forward for his pacemaker system.  Given the above, options include:   Programming his pacemaker system to an AAI mode. Capping the current ventricular lead, implanting a new ventricular lead.  Would prefer to time this closer to battery end of service to minimize the chances of him requiring multiple future generator  replacements. Capping the current leads, removing the generator and implanting a Micra leadless pacemaker.  Before finalizing a treatment plan, I would like to obtain a CT scan protocoled to assess the lead positions and left subclavian venous patency.  I will plan to see him back in the next few weeks after he has the CT scan.  In the meantime we will keep his ventricular lead outputs high to ensure RV capture.      2.  Ventricular high rate episode Single episode lasting 6 seconds.  Appears to be nonsustained ventricular tachycardia.  Continue Toprol-XL.     Follow-up in the next few weeks after CT scan to finalize treatment plan.    Total time spent with patient today 65 minutes. This includes reviewing records, evaluating the patient and coordinating care.  Medication Adjustments/Labs and Tests Ordered: Current medicines are reviewed at length with the patient today.  Concerns regarding medicines are outlined above.  No orders of the defined types were placed in this encounter.  No orders of the defined types were placed in this encounter.    Signed, Rossie Muskrat. Lalla Brothers, MD, [facility], [facility] 04/11/2021 7:08 PM    Electrophysiology [facility]

## 2021-04-11 ENCOUNTER — Ambulatory Visit (INDEPENDENT_AMBULATORY_CARE_PROVIDER_SITE_OTHER): Payer: Medicare Other | Admitting: Cardiology

## 2021-04-11 ENCOUNTER — Other Ambulatory Visit: Payer: Self-pay

## 2021-04-11 ENCOUNTER — Encounter: Payer: Self-pay | Admitting: Cardiology

## 2021-04-11 VITALS — BP 130/88 | HR 80 | Ht 66.0 in | Wt 163.0 lb

## 2021-04-11 DIAGNOSIS — T82518A Breakdown (mechanical) of other cardiac and vascular devices and implants, initial encounter: Secondary | ICD-10-CM

## 2021-04-11 DIAGNOSIS — Z950 Presence of cardiac pacemaker: Secondary | ICD-10-CM

## 2021-04-11 DIAGNOSIS — R001 Bradycardia, unspecified: Secondary | ICD-10-CM | POA: Diagnosis not present

## 2021-04-11 DIAGNOSIS — B20 Human immunodeficiency virus [HIV] disease: Secondary | ICD-10-CM

## 2021-04-11 DIAGNOSIS — T82110A Breakdown (mechanical) of cardiac electrode, initial encounter: Secondary | ICD-10-CM | POA: Diagnosis not present

## 2021-04-11 NOTE — Patient Instructions (Addendum)
Medication Instructions:  Your physician recommends that you continue on your current medications as directed. Please refer to the Current Medication list given to you today.  *If you need a refill on your cardiac medications before your next appointment, please call your pharmacy*   Lab Work: None ordered If you have labs (blood work) drawn today and your tests are completely normal, you will receive your results only by: MyChart Message (if you have MyChart) OR A paper copy in the mail If you have any lab test that is abnormal or we need to change your treatment, we will call you to review the results.   Testing/Procedures: Dr. Lalla Brothers recommends a CT.  The hospital will call you to schedule this.   Follow-Up: At [facility], you and your health needs are our priority.  As part of our continuing mission to provide you with exceptional heart care, we have created designated Provider Care Teams.  These Care Teams include your primary Cardiologist (physician) and Advanced Practice Providers (APPs -  Physician Assistants and Nurse Practitioners) who all work together to provide you with the care you need, when you need it.  Your next appointment:   1 month(s)  The format for your next appointment:   In Person  Provider:   Steffanie Dunn, MD    Thank you for choosing [facility]!!     Other Instructions  Your cardiac CT will be scheduled at one of the below locations:   [facility] [address] [place], [place] [address] [phone]  OR  [facility] [address] [place], [place] [address] [phone]  If scheduled at [facility], please arrive at the [facility] main entrance (entrance A) of [facility] 30 minutes prior to test start time. Proceed to the [facility] Radiology Department (first floor) to check-in and test prep.  If scheduled at [facility], please arrive 15 mins early for check-in and test prep.  Please follow these instructions carefully (unless otherwise directed):  Hold all erectile dysfunction medications at least 3 days (72 hrs) prior to test.  On the Night Before the Test: Be sure to Drink plenty of water. Do not consume any caffeinated/decaffeinated beverages or chocolate 12 hours prior to your test. Do not take any antihistamines 12 hours prior to your test.   On the Day of the Test: Drink plenty of water until 1 hour prior to the test. Do not eat any food 4 hours prior to the test. You may take your regular medications prior to the test.  HOLD Furosemide/Hydrochlorothiazide morning of the test.      After the Test: Drink plenty of water. After receiving IV contrast, you may experience a mild flushed feeling. This is normal. On occasion, you may experience a mild rash up to 24 hours after the test. This is not dangerous. If this occurs, you can take Benadryl 25 mg and increase your fluid intake. If you experience trouble breathing, this can be serious. If it is severe call 911 IMMEDIATELY. If it is mild, please call our office. If you take any of these medications: Glipizide/Metformin, Avandament, Glucavance, please do not take 48 hours after completing test unless otherwise instructed.   Once we have confirmed authorization from your insurance company, we will call you to set up a date and time for your test. Based on how quickly your insurance processes prior authorizations requests, please allow up to 4 weeks  to be contacted for scheduling your Cardiac CT appointment. Be advised that routine Cardiac CT appointments could be scheduled as many as 8 weeks after your provider has ordered it.  For non-scheduling related questions, please contact the cardiac imaging nurse navigator should you have any questions/concerns: Rockwell Alexandria, Cardiac Imaging Nurse Navigator Larey Brick, Cardiac Imaging Nurse  Navigator [facility] Direct Office Dial: [phone]   For scheduling needs, including cancellations and rescheduling, please call [place], [phone].

## 2021-04-12 ENCOUNTER — Other Ambulatory Visit (HOSPITAL_COMMUNITY): Payer: Self-pay

## 2021-04-18 NOTE — Addendum Note (Signed)
Addended by: Roney Mans A on: 04/18/2021 07:30 AM   Modules accepted: Orders

## 2021-05-01 ENCOUNTER — Other Ambulatory Visit (HOSPITAL_COMMUNITY): Payer: Self-pay

## 2021-05-07 ENCOUNTER — Telehealth: Payer: Self-pay | Admitting: Cardiology

## 2021-05-07 ENCOUNTER — Other Ambulatory Visit (HOSPITAL_COMMUNITY): Payer: Self-pay

## 2021-05-07 NOTE — Telephone Encounter (Signed)
-----   Message from Wiliam Ke, RN sent at 05/07/2021  1:45 PM EDT ----- Regarding: needs f/u with Dr. Lalla Brothers after CT on July 20 First available after July 20.  Ty Boneta Lucks

## 2021-05-07 NOTE — Telephone Encounter (Signed)
[name], Brian Ridgel R  P C[facility] Scheduling LVM for patient to schedule 1 month fu, recall placed as well         Previous Messages    ----- Message -----  From: Baird Lyons, RN  Sent: 04/19/2021  10:30 AM EDT  To: Baird Lyons, RN, [name]  Subject: lambert appt                                   Pt need f/u appt w/ Lalla Brothers in [facility] in one month please   Thx

## 2021-05-08 ENCOUNTER — Encounter: Payer: Self-pay | Admitting: Internal Medicine

## 2021-05-08 ENCOUNTER — Ambulatory Visit (INDEPENDENT_AMBULATORY_CARE_PROVIDER_SITE_OTHER): Payer: Medicare Other | Admitting: Internal Medicine

## 2021-05-08 ENCOUNTER — Other Ambulatory Visit: Payer: Self-pay

## 2021-05-08 VITALS — BP 110/70 | HR 90 | Temp 97.4°F | Ht 66.0 in | Wt 162.6 lb

## 2021-05-08 DIAGNOSIS — R918 Other nonspecific abnormal finding of lung field: Secondary | ICD-10-CM | POA: Diagnosis not present

## 2021-05-08 DIAGNOSIS — R9389 Abnormal findings on diagnostic imaging of other specified body structures: Secondary | ICD-10-CM | POA: Diagnosis not present

## 2021-05-08 DIAGNOSIS — J411 Mucopurulent chronic bronchitis: Secondary | ICD-10-CM

## 2021-05-08 DIAGNOSIS — J479 Bronchiectasis, uncomplicated: Secondary | ICD-10-CM | POA: Diagnosis not present

## 2021-05-08 NOTE — Progress Notes (Signed)
[facility] Consultation      Date: 05/08/2021,   MRN# [id number] Brian [name] Mar 12, 1941 Code Status:  Code Status History     Date Active Date Inactive Code Status Order ID Comments User Context   07/31/2015 0034 08/03/2015 1819 Full Code [id number]  Crissie Figures, MD Inpatient      Questions for Most Recent Historical Code Status (Order [id number])           AdmissionWeight: 162 lb 9.6 oz (73.8 kg)                 CurrentWeight: 162 lb 9.6 oz (73.8 kg) Brian [name] is a 80 y.o. old male seen in consultation for abnormal CXR      CHIEF COMPLAINT:  ABNORMAL CXR Chronic bronchitis    HISTORY OF PRESENT ILLNESS   80 year old pleasant Hispanic male seen today for abnormal chest x-ray I reviewed the chest x-ray with the patient there is a possible middle lung right-sided opacification along with left lower lobe opacity consistent with effusion Patient also has a pacemaker in place  Patient has chronic cough for many years Productive cough with white phlegm constantly Patient uses nebulizer therapy daily albuterol for many years and this helped significantly Patient also uses albuterol inhaler as needed  No previous history of pneumonia in the past Patient was a former smoker 1 pack would last for a month quit in 1998 patient is from [place] He worked in [employer] as a Public affairs consultant Patient has 3 pets in the home  Patient moved from [place] years ago Spanish-speaking only Interpreter at the visit  No exacerbation at this time No evidence of heart failure at this time No evidence or signs of infection at this time No respiratory distress No fevers, chills, nausea, vomiting, diarrhea No evidence of lower extremity edema No evidence hemoptysis  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 therapy    PAST MEDICAL HISTORY   Past Medical History:  Diagnosis Date   Arthritis    Asthma    Bradycardia    Depression     HIV (human immunodeficiency virus infection) (HCC)    Hyperlipidemia      SURGICAL HISTORY   Past Surgical History:  Procedure Laterality Date   cataracts     EYE SURGERY     PACEMAKER INSERTION N/A 08/02/2015   Procedure: INSERTION PACEMAKER;  Surgeon: Corky Downs, MD;  Location: [name];  Service: Cardiovascular;  Laterality: N/A;     FAMILY HISTORY   Family History  Problem Relation Age of Onset   Hypertension Other      SOCIAL HISTORY   Social History   Tobacco Use   Smoking status: Never   Smokeless tobacco: Never  Vaping Use   Vaping Use: Never used  Substance Use Topics   Alcohol use: Yes    Alcohol/week: 2.0 standard drinks    Types: 2 Cans of beer per week    Comment: 1-2 drinks a day   Drug use: No     MEDICATIONS    Home Medication:  Current Outpatient Rx   Order #: [id number] Class: Normal   Order #: [id number] Class: Historical Med   Order #: [id number] Class: Historical Med   Order #: [id number] Class: Historical Med   Order #: [id number] Class: Historical Med   Order #: [id number] Class: Historical Med   Order #: [id number] Class: Normal   Order #: [id number] Class: Historical Med   Order #: [id number] Class: Normal   Order #: [id number] Class: Historical Med  Order #: [id number] Class: Normal   Order #: [id number] Class: Historical Med   Order #: [id number] Class: Historical Med    Current Medication:  Current Outpatient Medications:    abacavir-dolutegravir-lamiVUDine (TRIUMEQ) 600-50-300 MG tablet, Take 1 tablet by mouth daily., Disp: 30 tablet, Rfl: 5   alendronate (FOSAMAX) 70 MG tablet, Take 70 mg by mouth once a week. Take on Saturday., Disp: , Rfl: 5   atorvastatin (LIPITOR) 40 MG tablet, Take 40 mg by mouth daily., Disp: , Rfl:    azelastine (OPTIVAR) 0.05 % ophthalmic solution, Apply 1 drop to eye 2 (two) times daily., Disp: , Rfl:    clonazePAM (KLONOPIN) 0.5 MG tablet, Take 0.5 mg by mouth 2 (two) times daily as needed for anxiety., Disp: ,  Rfl:    DULoxetine (CYMBALTA) 30 MG capsule, Take 30 mg by mouth daily., Disp: , Rfl:    dutasteride (AVODART) 0.5 MG capsule, Take 1 capsule (0.5 mg total) by mouth daily., Disp: 30 capsule, Rfl: 11   loratadine (CLARITIN) 10 MG tablet, Take 10 mg by mouth daily., Disp: , Rfl:    metoprolol succinate (TOPROL-XL) 25 MG 24 hr tablet, Take 1 tablet (25 mg total) by mouth daily., Disp: 90 tablet, Rfl: 3   omeprazole (PRILOSEC) 20 MG capsule, Take 20 mg by mouth daily., Disp: , Rfl: 5   tamsulosin (FLOMAX) 0.4 MG CAPS capsule, Take 1 capsule (0.4 mg total) by mouth daily., Disp: 30 capsule, Rfl: 11   traMADol-acetaminophen (ULTRACET) 37.5-325 MG tablet, Take 1 tablet by mouth daily as needed., Disp: , Rfl:    VENTOLIN HFA 108 (90 BASE) MCG/ACT inhaler, Inhale 2 puffs into the lungs 4 (four) times daily as needed. For shortness of breath and/or wheezing., Disp: , Rfl: 5    ALLERGIES   Patient has no known allergies.     REVIEW OF SYSTEMS    Review of Systems:  Gen:  Denies  fever, sweats, chills weigh loss  HEENT: Denies blurred vision, double vision, ear pain, eye pain, hearing loss, nose bleeds, sore throat Cardiac:  No dizziness, chest pain or heaviness, chest tightness,edema Resp:   Denies cough or sputum porduction, shortness of breath,wheezing, hemoptysis,  Gi: Denies swallowing difficulty, stomach pain, nausea or vomiting, diarrhea, constipation, bowel incontinence Gu:  Denies bladder incontinence, burning urine Ext:   Denies Joint pain, stiffness or swelling Skin: Denies  skin rash, easy bruising or bleeding or hives Endoc:  Denies polyuria, polydipsia , polyphagia or weight change Psych:   Denies depression, insomnia or hallucinations  Other:  All other systems negative   VS: BP 110/70 (BP Location: Right Arm, Patient Position: Sitting, Cuff Size: Large)   Pulse 90   Temp (!) 97.4 F (36.3 C) (Oral)   Ht 5\' 6"  (1.676 m)   Wt 162 lb 9.6 oz (73.8 kg)   SpO2 97%   BMI  26.24 kg/m      PHYSICAL EXAM  Physical Examination:   General Appearance: No distress  EYES PERRLA, EOM intact.   NECK Supple, No JVD Pulmonary: normal breath sounds, No wheezing.  CardiovascularNormal S1,S2.  No m/r/g.   Abdomen: Benign, Soft, non-tender. Skin:   warm, no rashes, no ecchymosis  Extremities: normal, no cyanosis, clubbing. Neuro:without focal findings,  speech normal  PSYCHIATRIC: Mood, affect within normal limits.   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  At this time his bronchiectasis and bronchitis is controlled with nebulizers and inhaler therapy No exacerbation at this time No indication for prednisone or antibiotics at this time Patient seems to be very active at this time Will consider flutter valve and incentive spirometry therapy  Abnormal chest x-ray Patient will need CT of his chest to assess lungs    MEDICATION ADJUSTMENTS/LABS AND TESTS ORDERED:  Continue inhalers as prescribed Continue nebulizers as prescribed Obtain CT of the chest for abnormal findings  CURRENT MEDICATIONS REVIEWED AT LENGTH WITH PATIENT TODAY   Patient  satisfied with Plan of action and management. All questions answered  Follow up 6 months  Total Time Spent 50 mins   Wallis Bamberg Santiago Glad, M.D.  [facility]  Medical Director [facility] Medical Director [facility] Cardio-Pulmonary Department

## 2021-05-08 NOTE — Patient Instructions (Signed)
Continue inhalers as prescribed Continue nebulizers as prescribed Obtain CT of the chest for abnormal findings

## 2021-05-10 ENCOUNTER — Telehealth: Payer: Self-pay

## 2021-05-10 DIAGNOSIS — T82110A Breakdown (mechanical) of cardiac electrode, initial encounter: Secondary | ICD-10-CM

## 2021-05-10 DIAGNOSIS — R001 Bradycardia, unspecified: Secondary | ICD-10-CM

## 2021-05-10 NOTE — Telephone Encounter (Signed)
Left detailed message requesting call back to schedule BMP at [facility].  Also notify Pt of follow up visit scheduled with Dr. Lalla Brothers.

## 2021-05-14 ENCOUNTER — Telehealth (HOSPITAL_COMMUNITY): Payer: Self-pay | Admitting: *Deleted

## 2021-05-14 NOTE — Telephone Encounter (Signed)
Attempted to call patient regarding upcoming cardiac CT appointment. °Left message on voicemail with name and callback number ° °Indra Wolters RN Navigator Cardiac Imaging °[facility] °[phone] Office °[phone] Cell ° °

## 2021-05-15 ENCOUNTER — Encounter (HOSPITAL_COMMUNITY): Payer: Self-pay

## 2021-05-15 ENCOUNTER — Other Ambulatory Visit
Admission: RE | Admit: 2021-05-15 | Discharge: 2021-05-15 | Disposition: A | Discharge status: Home / Self Care | Payer: Medicare Other | Attending: Cardiology | Admitting: Cardiology

## 2021-05-15 ENCOUNTER — Telehealth (HOSPITAL_COMMUNITY): Payer: Self-pay | Admitting: *Deleted

## 2021-05-15 ENCOUNTER — Other Ambulatory Visit (HOSPITAL_COMMUNITY): Payer: Self-pay | Admitting: *Deleted

## 2021-05-15 DIAGNOSIS — T82110A Breakdown (mechanical) of cardiac electrode, initial encounter: Secondary | ICD-10-CM | POA: Insufficient documentation

## 2021-05-15 DIAGNOSIS — R001 Bradycardia, unspecified: Secondary | ICD-10-CM | POA: Diagnosis present

## 2021-05-15 DIAGNOSIS — Y838 Other surgical procedures as the cause of abnormal reaction of the patient, or of later complication, without mention of misadventure at the time of the procedure: Secondary | ICD-10-CM | POA: Insufficient documentation

## 2021-05-15 LAB — BASIC METABOLIC PANEL
Anion gap: 9 (ref 5–15)
BUN: 10 mg/dL (ref 8–23)
CO2: 26 mmol/L (ref 22–32)
Calcium: 9 mg/dL (ref 8.9–10.3)
Chloride: 101 mmol/L (ref 98–111)
Creatinine, Ser: 1.01 mg/dL (ref 0.61–1.24)
GFR, Estimated: >60 mL/min (ref >60)
Glucose, Bld: 98 mg/dL (ref 70–99)
Potassium: 3.8 mmol/L (ref 3.5–5.1)
Sodium: 136 mmol/L (ref 135–145)

## 2021-05-15 NOTE — Telephone Encounter (Signed)
Attempted to call patient regarding upcoming cardiac CT appointment. °Left message on voicemail with name and callback number ° °Gwendolyn Mclees RN Navigator Cardiac Imaging °[facility] °[phone] Office °[phone] Cell ° °

## 2021-05-16 ENCOUNTER — Other Ambulatory Visit: Payer: Self-pay

## 2021-05-16 ENCOUNTER — Ambulatory Visit (HOSPITAL_COMMUNITY)
Admission: RE | Admit: 2021-05-16 | Discharge: 2021-05-16 | Disposition: A | Discharge status: Home / Self Care | Payer: Medicare Other | Source: Ambulatory Visit | Attending: Cardiology | Admitting: Cardiology

## 2021-05-16 DIAGNOSIS — Z950 Presence of cardiac pacemaker: Secondary | ICD-10-CM

## 2021-05-16 DIAGNOSIS — T82518A Breakdown (mechanical) of other cardiac and vascular devices and implants, initial encounter: Secondary | ICD-10-CM | POA: Diagnosis present

## 2021-05-16 DIAGNOSIS — T82110A Breakdown (mechanical) of cardiac electrode, initial encounter: Secondary | ICD-10-CM | POA: Diagnosis present

## 2021-05-16 DIAGNOSIS — R001 Bradycardia, unspecified: Secondary | ICD-10-CM | POA: Diagnosis present

## 2021-05-16 DIAGNOSIS — I700 Atherosclerosis of aorta: Secondary | ICD-10-CM | POA: Diagnosis not present

## 2021-05-16 MED ORDER — METOPROLOL TARTRATE 5 MG/5ML IV SOLN: 10.0000 mg | INTRAVENOUS | Status: DC | PRN

## 2021-05-16 MED ORDER — IOHEXOL 350 MG/ML SOLN
80.0000 mL | Freq: Once | INTRAVENOUS | Status: AC | PRN
  Administered 2021-05-16: 80 mL via INTRAVENOUS

## 2021-05-16 MED ORDER — METOPROLOL TARTRATE 5 MG/5ML IV SOLN
INTRAVENOUS | Status: AC
  Administered 2021-05-16: 10 mg via INTRAVENOUS
  Filled 2021-05-16: qty 20

## 2021-05-22 NOTE — Progress Notes (Signed)
Brian [name]:    Date:  05/23/2021   ID:  Brian [name], DOB 1941/08/25, MRN [id number]  PCP:  Brian Athens, MD  [facility] Cardiologist:  None  [facility] Electrophysiologist:  Brian Epley, MD    Interval History:    Brian [name] is a 80 y.o. male who presents for a follow up visit.  I last saw the patient April 11, 2021 for a pacemaker lead malfunction.  At that appointment I ordered a CT lead extraction protocol to assess the position of the right ventricular lead.  The CT scan showed this lead was within the middle cardiac vein.  A Spanish interpreter was used during this visit.  The patient's son was here who I previously met.  The patient has done well since I last saw him.  He has had had the CT scan of the chest.    Past Medical History:  Diagnosis Date   Arthritis    Asthma    Bradycardia    Depression    HIV (human immunodeficiency virus infection) ([place])    Hyperlipidemia     Past Surgical History:  Procedure Laterality Date   Brian [name]     EYE SURGERY     PACEMAKER INSERTION N/A 08/02/2015   Procedure: INSERTION PACEMAKER;  Surgeon: Brian Athens, MD;  Location: [name];  Service: Cardiovascular;  Laterality: N/A;    Current Medications: Current Meds  Medication Sig   abacavir-dolutegravir-lamiVUDine (TRIUMEQ) 600-50-300 MG tablet Take 1 tablet by mouth daily.   alendronate (FOSAMAX) 70 MG tablet Take 70 mg by mouth once a week. Take on Saturday.   atorvastatin (LIPITOR) 40 MG tablet Take 40 mg by mouth daily.   azelastine (OPTIVAR) 0.05 % ophthalmic solution Apply 1 drop to eye 2 (two) times daily.   clonazePAM (KLONOPIN) 0.5 MG tablet Take 0.5 mg by mouth 2 (two) times daily as needed for anxiety.   DULoxetine (CYMBALTA) 30 MG capsule Take 30 mg by mouth daily.   dutasteride (AVODART) 0.5 MG capsule Take 1 capsule (0.5 mg total) by mouth daily.   loratadine (CLARITIN) 10 MG tablet Take 10 mg by mouth daily.    metoprolol succinate (TOPROL-XL) 25 MG 24 hr tablet Take 1 tablet (25 mg total) by mouth daily.   omeprazole (PRILOSEC) 20 MG capsule Take 20 mg by mouth daily.   tamsulosin (FLOMAX) 0.4 MG CAPS capsule Take 1 capsule (0.4 mg total) by mouth daily.   traMADol-acetaminophen (ULTRACET) 37.5-325 MG tablet Take 1 tablet by mouth daily as needed.   VENTOLIN HFA 108 (90 BASE) MCG/ACT inhaler Inhale 2 puffs into the lungs 4 (four) times daily as needed. For shortness of breath and/or wheezing.     Allergies:   Patient has no known allergies.   Social History   Socioeconomic History   Marital status: Married    Spouse name: Not on file   Number of children: Not on file   Years of education: Not on file   Highest education level: Not on file  Occupational History   Not on file  Tobacco Use   Smoking status: Never   Smokeless tobacco: Never  Vaping Use   Vaping Use: Never used  Substance and Sexual Activity   Alcohol use: Yes    Alcohol/week: 2.0 standard drinks    Types: 2 Cans of beer per week    Comment: 1-2 drinks a day   Drug use: No   Sexual activity: Not on file  Other Topics  Concern   Not on file  Social History Narrative   Not on file   Social Determinants of Health   Financial Resource Strain: Not on file  Food Insecurity: Not on file  Transportation Needs: Not on file  Physical Activity: Not on file  Stress: Not on file  Social Connections: Not on file     Family History: The patient's family history includes Hypertension in an other family member.  ROS:   Please see the history of present illness.    All other systems reviewed and are negative.  EKGs/Labs/Other Studies Reviewed:    The following studies were reviewed today:  April 23, 2021 in clinic device interrogation personally reviewed Battery longevity estimated at 5 years.  Ventricular pacing 2.9% Atrial pacing 40.8% Reprogrammed to AAI-DDD Reprogrammed base rate to 40 bpm With atrial pacing at 90  bpm there is intact one-to-one AV conduction  EKG:  The ekg ordered today demonstrates a paced, V sensed  Recent Labs: 12/14/2020: ALT 22; Hemoglobin 14.8; Platelets 239 05/15/2021: BUN 10; Creatinine, Ser 1.01; Potassium 3.8; Sodium 136  Recent Lipid Panel    Component Value Date/Time   CHOL 212 (H) 12/14/2020 1143   CHOL 124 10/16/2014 0405   TRIG 275 (H) 12/14/2020 1143   TRIG 158 10/16/2014 0405   HDL 40 (L) 12/14/2020 1143   HDL 35 (L) 10/16/2014 0405   CHOLHDL 5.3 12/14/2020 1143   VLDL 55 (H) 12/14/2020 1143   VLDL 32 10/16/2014 0405   LDLCALC 117 (H) 12/14/2020 1143   LDLCALC 57 10/16/2014 0405    Physical Exam:    VS:  BP 130/80 (BP Location: Left Arm, Patient Position: Sitting, Cuff Size: Normal)   Pulse 73   Ht '5\' 6"'  (1.676 m)   Wt 160 lb (72.6 kg)   SpO2 97%   BMI 25.82 kg/m     Wt Readings from Last 3 Encounters:  05/23/21 160 lb (72.6 kg)  05/08/21 162 lb 9.6 oz (73.8 kg)  04/11/21 163 lb (73.9 kg)     GEN:  Well nourished, well developed in no acute distress HEENT: Normal NECK: No JVD; No carotid bruits LYMPHATICS: No lymphadenopathy CARDIAC: RRR, no murmurs, rubs, gallops RESPIRATORY:  Clear to auscultation without rales, wheezing or rhonchi  ABDOMEN: Soft, non-tender, non-distended MUSCULOSKELETAL:  No edema; No deformity  SKIN: Warm and dry NEUROLOGIC:  Alert and oriented x 3 PSYCHIATRIC:  Normal affect   ASSESSMENT:    1. Symptomatic bradycardia   2. Cardiac pacemaker   3. Failure of pacemaker lead, initial encounter    PLAN:    In order of problems listed above:  1. 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  The right ventricular lead is within the middle cardiac vein.  The right ventricular lead does not reliably function and intermittently captures the atrial tissue.  We discussed the options for managing this moving forward.  Option 1 would be to extract the right ventricular lead and  implant a new right ventricular lead.  I do not think this is a good option given the risk associated with extracting a passive fixation lead within the coronary sinus.  Option 2 would be to abandon the current RV lead and implant a new right ventricular lead.  I do not think this is absolutely necessary given the patient rarely uses the ventricular lead.  Option 3 would be to reprogram the device to atrially pace and maximize battery longevity.  After a long discussion with  the patient and his son, we decided on option 3.  I reprogrammed his device and lowered the base rate to 40 bpm in an effort to minimize pacing and maximize battery output.  Would plan on attempting RV lead addition at the time of generator replacement when we get to that point.  3. Failure of pacemaker lead, initial encounter See #1    Follow-up 1 year or sooner as needed.  Total time spent with patient today 45 minutes. This includes reviewing records, evaluating the patient and coordinating care.   Medication Adjustments/Labs and Tests Ordered: Current medicines are reviewed at length with the patient today.  Concerns regarding medicines are outlined above.  Orders Placed This Encounter  Procedures   EKG 12-Lead   No orders of the defined types were placed in this encounter.    Signed, Lars Mage, MD, [facility], [facility] 05/23/2021 1:12 PM    [facility]

## 2021-05-23 ENCOUNTER — Other Ambulatory Visit: Payer: Self-pay

## 2021-05-23 ENCOUNTER — Ambulatory Visit (INDEPENDENT_AMBULATORY_CARE_PROVIDER_SITE_OTHER): Payer: Medicare Other | Admitting: Cardiology

## 2021-05-23 ENCOUNTER — Encounter: Payer: Self-pay | Admitting: Cardiology

## 2021-05-23 VITALS — BP 130/80 | HR 73 | Ht 66.0 in | Wt 160.0 lb

## 2021-05-23 DIAGNOSIS — R001 Bradycardia, unspecified: Secondary | ICD-10-CM

## 2021-05-23 DIAGNOSIS — T82110A Breakdown (mechanical) of cardiac electrode, initial encounter: Secondary | ICD-10-CM

## 2021-05-23 DIAGNOSIS — Z950 Presence of cardiac pacemaker: Secondary | ICD-10-CM

## 2021-05-23 DIAGNOSIS — T82110D Breakdown (mechanical) of cardiac electrode, subsequent encounter: Secondary | ICD-10-CM | POA: Diagnosis not present

## 2021-05-23 NOTE — Patient Instructions (Signed)
Medication Instructions:  Your physician recommends that you continue on your current medications as directed. Please refer to the Current Medication list given to you today. *If you need a refill on your cardiac medications before your next appointment, please call your pharmacy*  Lab Work: None ordered. If you have labs (blood work) drawn today and your tests are completely normal, you will receive your results only by: . MyChart Message (if you have MyChart) OR . A paper copy in the mail If you have any lab test that is abnormal or we need to change your treatment, we will call you to review the results.  Testing/Procedures: None ordered.  Follow-Up: At [facility], you and your health needs are our priority.  As part of our continuing mission to provide you with exceptional heart care, we have created designated Provider Care Teams.  These Care Teams include your primary Cardiologist (physician) and Advanced Practice Providers (APPs -  Physician Assistants and Nurse Practitioners) who all work together to provide you with the care you need, when you need it.  Your next appointment:   Your physician wants you to follow-up in: one year with Dr. Lambert.   You will receive a reminder letter in the mail two months in advance. If you don't receive a letter, please call our office to schedule the follow-up appointment.    

## 2021-05-24 ENCOUNTER — Other Ambulatory Visit: Payer: Self-pay

## 2021-05-24 ENCOUNTER — Ambulatory Visit
Admission: RE | Admit: 2021-05-24 | Discharge: 2021-05-24 | Disposition: A | Discharge status: Home / Self Care | Payer: Medicare Other | Source: Ambulatory Visit | Attending: Internal Medicine | Admitting: Internal Medicine

## 2021-05-24 DIAGNOSIS — J411 Mucopurulent chronic bronchitis: Secondary | ICD-10-CM | POA: Diagnosis present

## 2021-05-24 DIAGNOSIS — R9389 Abnormal findings on diagnostic imaging of other specified body structures: Secondary | ICD-10-CM | POA: Insufficient documentation

## 2021-05-24 DIAGNOSIS — J479 Bronchiectasis, uncomplicated: Secondary | ICD-10-CM | POA: Diagnosis present

## 2021-05-24 DIAGNOSIS — R918 Other nonspecific abnormal finding of lung field: Secondary | ICD-10-CM

## 2021-05-28 ENCOUNTER — Telehealth: Payer: Self-pay | Admitting: Internal Medicine

## 2021-05-28 NOTE — Telephone Encounter (Signed)
Spoke to patient's daughter, Lolita Cram) who is requesting results from 05/24/2021.  Dr. Belia Heman, please advise. Thanks

## 2021-05-29 NOTE — Telephone Encounter (Signed)
Patient's daughter, Lolita Cram) is aware of results and voiced her understanding. Johney Maine is concerned that patient needs oxygen with exertion. Patient wears 2L QHS.  Dr. Belia Heman, please advise if okay to order a qualifying test? Thanks.

## 2021-05-29 NOTE — Telephone Encounter (Signed)
Brian [name] daughter is returning phone call. Brian [name] phone number is [phone].

## 2021-05-29 NOTE — Telephone Encounter (Signed)
Lm for patient's daughter, Lolita Cram)

## 2021-05-29 NOTE — Telephone Encounter (Signed)
Lm x1 for patient's daughter/

## 2021-05-30 ENCOUNTER — Telehealth: Payer: Self-pay | Admitting: Internal Medicine

## 2021-05-30 ENCOUNTER — Other Ambulatory Visit (HOSPITAL_COMMUNITY): Payer: Self-pay

## 2021-05-30 NOTE — Telephone Encounter (Signed)
Lm x2 for patient's daughter, Lolita Cram). Will close encounter per office protocol.

## 2021-05-30 NOTE — Telephone Encounter (Signed)
Spoke to patient's daughter, Lolita Cram) who stated that she was returning our call.  It appears that it was an older message and results have already been relayed to [place].  Nothing further needed at this time.

## 2021-06-06 ENCOUNTER — Other Ambulatory Visit (HOSPITAL_COMMUNITY): Payer: Self-pay

## 2021-06-14 ENCOUNTER — Other Ambulatory Visit
Admission: RE | Admit: 2021-06-14 | Discharge: 2021-06-14 | Disposition: A | Discharge status: Home / Self Care | Payer: Medicare Other | Source: Ambulatory Visit | Attending: Infectious Diseases | Admitting: Infectious Diseases

## 2021-06-14 ENCOUNTER — Encounter: Payer: Self-pay | Admitting: Infectious Diseases

## 2021-06-14 ENCOUNTER — Ambulatory Visit: Payer: Medicare Other | Attending: Infectious Diseases | Admitting: Infectious Diseases

## 2021-06-14 ENCOUNTER — Other Ambulatory Visit: Payer: Self-pay

## 2021-06-14 VITALS — BP 141/93 | HR 84 | Temp 97.5°F | Resp 16 | Ht 66.0 in | Wt 161.0 lb

## 2021-06-14 DIAGNOSIS — B20 Human immunodeficiency virus [HIV] disease: Secondary | ICD-10-CM | POA: Insufficient documentation

## 2021-06-14 DIAGNOSIS — Z950 Presence of cardiac pacemaker: Secondary | ICD-10-CM | POA: Insufficient documentation

## 2021-06-14 DIAGNOSIS — R001 Bradycardia, unspecified: Secondary | ICD-10-CM | POA: Diagnosis not present

## 2021-06-14 DIAGNOSIS — E785 Hyperlipidemia, unspecified: Secondary | ICD-10-CM | POA: Diagnosis not present

## 2021-06-14 DIAGNOSIS — I10 Essential (primary) hypertension: Secondary | ICD-10-CM | POA: Insufficient documentation

## 2021-06-14 DIAGNOSIS — Z8616 Personal history of COVID-19: Secondary | ICD-10-CM | POA: Diagnosis not present

## 2021-06-14 DIAGNOSIS — Z8249 Family history of ischemic heart disease and other diseases of the circulatory system: Secondary | ICD-10-CM | POA: Diagnosis not present

## 2021-06-14 NOTE — Progress Notes (Signed)
NAME: Brian [name]  DOB: 04-30-1941  MRN: [id number]  Date/Time: 06/14/2021 10:27 AM Subjective:  Patient here for follow-up of HIV  Patient's  daughter in law  with him during this visit  spanish interpreter here for the whole visit  Brian [name] is a 80 y.o. 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  Since I last saw him in feb 2022 he had seen pulmonologist for sob and a CT tchest done on 05/24/21 showed irregular and bandlike ground-glass airspace opacity with arching subpleural elements and subpleural sparing throughout the lungs, new compared to prior CT dated 04/02/2017. 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 He had Covid infection late last year and was sicker than normal but did not get hospitalized.   Pt says he is feeling better He has home oxygen His daughter-in-law states that he gets short of breath and he would like to have a carry on oxygen He is 100% adherent to HAART His last Vl was 40 and cd4 was 388 Says Triumeq is being delivered from [name].  And it is more regular now.  HIV history Diagnosed in 2014 when he found out that his wife in [place] had died of AIDS. He is originally from there but has been in [place] for the past 31 years HIV diagnosed 2014 1st visit to [facility] 11/25/13 Nadir cd4 was 120 (5%) on 10/29/2013 VL 273,240 from 10/29/2013  HAARt history 1st regimen was complera 2nd regimen triumeq As per Brian [name]'s note on 08/06/16  Recommendations HIV -he has had some issues with compliance with Complera in the past. His viral load has been slightly elevated now for several checks.  I would like to consider switching him to another regimen potentially more potent given the concern for developing resistance. He had pan sensitive virus on genotype in 2015. WIll check HLA [id number] and if neg start triumeq   Telephone Encounter  - Brian Form, MD - 08/29/2016 11:35 AM EDT please let pt know that his testing we did to change his medicine to a stronger one was good  I have sent in a new medicine triumeq to take in place of the Complera. He should stop the complera once he gets the triumeq. He should call if any new problems - otherwise I will see him in Jan    Electronically signed by Brian Form, MD at 08/29/2016 11:39 AM EDT    Acquired thru- heterosexual contact Genotype highly sensitive organism  ? Past Medical History:  Diagnosis Date   Arthritis    Asthma    Bradycardia    Depression    HIV (human immunodeficiency virus infection) ([place])    Hyperlipidemia   Osteoporosis   Past Surgical History:  Procedure Laterality Date   cataracts     EYE SURGERY     PACEMAKER INSERTION N/A 08/02/2015   Procedure: INSERTION PACEMAKER;  Surgeon: Brian Athens, MD;  Location: [name];  Service: Cardiovascular;  Laterality: N/A;  prosthesis for urinary flow   SH Lives on his own 6 children and many grandchildren Non smoker No alcohol or illicit drug use   Family History  Problem Relation Age of Onset   Hypertension Other    No Known Allergies    ?Current outpatient medications reviewed  Patient did not bring his medication list or pills today.  REVIEW OF SYSTEMS:  Const: negative fever, negative chills, negative weight loss Eyes: negative  diplopia or visual changes, negative eye pain ENT: negative coryza, negative sore throat Resp: negative cough, hemoptysis, has dyspnea on exertion alert looks Cards: negative for chest pain, palpitations, lower extremity edema GU: negative for frequency, dysuria and hematuria Skin: negative for rash and pruritus Heme: negative for easy bruising and gum/nose bleeding MS: negative for myalgias, arthralgias, back pain and muscle weakness Neurolo: no headache or dizziness Psych: negative for feelings of anxiety, depression   Objective:  BP  (!) 141/93   Pulse 84   Temp (!) 97.5 F (36.4 C) (Oral)   Resp 16   Ht '5\' 6"'  (1.676 m)   Wt 161 lb (73 kg)   SpO2 94%   BMI 25.99 kg/m    PHYSICAL EXAM:  General: Well. Head: Normocephalic, without obvious abnormality, atraumatic. Eyes: Conjunctivae clear, anicteric sclerae. Pupils are equal Nose: Nares normal. No drainage or sinus tenderness. Throat: Lips, mucosa, and tongue normal. No Thrush Neck: Supple, symmetrical, no adenopathy, thyroid: non tender no carotid bruit and no JVD. Back: No CVA tenderness. Lungs: Bilateral air entry. Heart: Regular rate and rhythm, no murmur, rub or gallop.pacemaker site fine Abdomen: Soft, non-tender,not distended. Bowel sounds normal. No masses Skin: No rashes or lesions. Not Jaundiced Lymph: Cervical, supraclavicular normal. Neurologic: Grossly non-focal   Health maintenance Vaccination   Vaccine Date last given comment  Influenza    Hepatitis B    Hepatitis A    Prevnar-PCV-13 12/01/18   Pneumovac-PPSV-23 2016   TdaP 12/01/18   HPV    Shingrix ( zoster vaccine)     ______________________  Labs Lab Result  Date comment  HIV VL 330 02/03/20   CD4 259 (25%) 02/03/20   Genotype Sensitive virus 10/29/2013 Genosure prime  [id number] NEgative 08/06/16   HIV antibody     RPR NR 02/03/20   Quantiferon Gold neg 02/03/20   Hep C ab NR 02/03/20   Hepatitis B-ab,ag,c     Hepatitis A-IgM, IgG /T     Lipid     GC/CHL          HB,PLT,Cr, LFT 14.5/218/0.99 06/01/19     Preventive  Procedure Result  Date comment  colonoscopy   none       Dental exam   Not recently  Opthal   Few months ago     Impression/Recommendation ?HIV /AIDS- on Triumeq  VL 40 and cd4 is more than 400. We will do labs today  Recovered Covid infection Saw Brian [name]- CT chest in July had some scarring Today pulse ox resting 96% and on ambuating still 97% HTN- controlled pacemaker for symptomatic bradycardia recent   Hematuria- one episode-was followed by  Brian [name]-he refused cystoscopy- he has no further hematuria Hyperlipidemia on atorvastatin  Prescriptions is now from [place] We will follow patient in 6 months. We will do labs today. Discussed the management with the patient and his daughter-in-law through interpreter.

## 2021-06-15 LAB — T-HELPER CELLS CD4/CD8 %
% CD 4 Pos. Lymph.: 25.1 % — ABNORMAL LOW (ref 30.8–58.5)
Absolute CD 4 Helper: 326 /uL — ABNORMAL LOW (ref 359–1519)
Basophils Absolute: 0 10*3/uL (ref 0.0–0.2)
Basos: 1 %
CD3+CD4+ Cells/CD3+CD8+ Cells Bld: 0.69 — ABNORMAL LOW (ref 0.92–3.72)
CD3+CD8+ Cells # Bld: 471 /uL (ref 109–897)
CD3+CD8+ Cells NFr Bld: 36.2 % — ABNORMAL HIGH (ref 12.0–35.5)
EOS (ABSOLUTE): 0.1 10*3/uL (ref 0.0–0.4)
Eos: 1 %
Hematocrit: 45.5 % (ref 37.5–51.0)
Hemoglobin: 15.7 g/dL (ref 13.0–17.7)
Immature Grans (Abs): 0 10*3/uL (ref 0.0–0.1)
Immature Granulocytes: 0 %
Lymphocytes Absolute: 1.3 10*3/uL (ref 0.7–3.1)
Lymphs: 20 %
MCH: 30.7 pg (ref 26.6–33.0)
MCHC: 34.5 g/dL (ref 31.5–35.7)
MCV: 89 fL (ref 79–97)
Monocytes Absolute: 0.6 10*3/uL (ref 0.1–0.9)
Monocytes: 9 %
Neutrophils Absolute: 4.6 10*3/uL (ref 1.4–7.0)
Neutrophils: 69 %
Platelets: 182 10*3/uL (ref 150–450)
RBC: 5.12 x10E6/uL (ref 4.14–5.80)
RDW: 14.6 % (ref 11.6–15.4)
WBC: 6.6 10*3/uL (ref 3.4–10.8)

## 2021-06-15 LAB — HIV-1 RNA QUANT-NO REFLEX-BLD
HIV 1 RNA Quant: 60 copies/mL
LOG10 HIV-1 RNA: 1.778 log10copy/mL

## 2021-06-15 LAB — RPR: RPR Ser Ql: NONREACTIVE

## 2021-06-24 ENCOUNTER — Other Ambulatory Visit: Payer: Self-pay | Admitting: Urology

## 2021-06-28 ENCOUNTER — Other Ambulatory Visit (HOSPITAL_COMMUNITY): Payer: Self-pay

## 2021-07-05 ENCOUNTER — Other Ambulatory Visit: Payer: Self-pay

## 2021-07-05 ENCOUNTER — Other Ambulatory Visit (HOSPITAL_COMMUNITY): Payer: Self-pay

## 2021-07-05 MED ORDER — NYSTATIN-TRIAMCINOLONE 100000-0.1 UNIT/GM-% EX OINT
TOPICAL_OINTMENT | Freq: Two times a day (BID) | CUTANEOUS | 0 refills | Status: DC
  Filled 2021-07-05: qty 30, 10d supply, fill #0

## 2021-07-06 ENCOUNTER — Other Ambulatory Visit (HOSPITAL_COMMUNITY): Payer: Self-pay

## 2021-07-13 ENCOUNTER — Other Ambulatory Visit: Payer: Self-pay

## 2021-07-13 ENCOUNTER — Inpatient Hospital Stay
Admission: EM | Admit: 2021-07-13 | Discharge: 2021-07-18 | DRG: 313 | Disposition: A | Discharge status: Home Health | Payer: Medicare Other | Attending: Student in an Organized Health Care Education/Training Program | Admitting: Student in an Organized Health Care Education/Training Program

## 2021-07-13 ENCOUNTER — Emergency Department: Payer: Medicare Other

## 2021-07-13 ENCOUNTER — Encounter: Payer: Self-pay | Admitting: Radiology

## 2021-07-13 DIAGNOSIS — R0789 Other chest pain: Secondary | ICD-10-CM | POA: Diagnosis not present

## 2021-07-13 DIAGNOSIS — B20 Human immunodeficiency virus [HIV] disease: Secondary | ICD-10-CM | POA: Diagnosis present

## 2021-07-13 DIAGNOSIS — M79606 Pain in leg, unspecified: Secondary | ICD-10-CM

## 2021-07-13 DIAGNOSIS — L03115 Cellulitis of right lower limb: Secondary | ICD-10-CM | POA: Diagnosis present

## 2021-07-13 DIAGNOSIS — I10 Essential (primary) hypertension: Secondary | ICD-10-CM | POA: Diagnosis present

## 2021-07-13 DIAGNOSIS — Z21 Asymptomatic human immunodeficiency virus [HIV] infection status: Secondary | ICD-10-CM | POA: Diagnosis present

## 2021-07-13 DIAGNOSIS — Z8249 Family history of ischemic heart disease and other diseases of the circulatory system: Secondary | ICD-10-CM

## 2021-07-13 DIAGNOSIS — R001 Bradycardia, unspecified: Secondary | ICD-10-CM | POA: Diagnosis present

## 2021-07-13 DIAGNOSIS — J9611 Chronic respiratory failure with hypoxia: Secondary | ICD-10-CM | POA: Diagnosis present

## 2021-07-13 DIAGNOSIS — M549 Dorsalgia, unspecified: Secondary | ICD-10-CM | POA: Diagnosis present

## 2021-07-13 DIAGNOSIS — M79672 Pain in left foot: Secondary | ICD-10-CM

## 2021-07-13 DIAGNOSIS — Z950 Presence of cardiac pacemaker: Secondary | ICD-10-CM

## 2021-07-13 DIAGNOSIS — F32A Depression, unspecified: Secondary | ICD-10-CM | POA: Diagnosis present

## 2021-07-13 DIAGNOSIS — N400 Enlarged prostate without lower urinary tract symptoms: Secondary | ICD-10-CM | POA: Diagnosis present

## 2021-07-13 DIAGNOSIS — J42 Unspecified chronic bronchitis: Secondary | ICD-10-CM | POA: Diagnosis present

## 2021-07-13 DIAGNOSIS — R079 Chest pain, unspecified: Secondary | ICD-10-CM | POA: Diagnosis present

## 2021-07-13 DIAGNOSIS — I712 Thoracic aortic aneurysm, without rupture: Secondary | ICD-10-CM | POA: Diagnosis present

## 2021-07-13 DIAGNOSIS — Z20822 Contact with and (suspected) exposure to covid-19: Secondary | ICD-10-CM | POA: Diagnosis present

## 2021-07-13 DIAGNOSIS — I454 Nonspecific intraventricular block: Secondary | ICD-10-CM | POA: Diagnosis present

## 2021-07-13 DIAGNOSIS — K219 Gastro-esophageal reflux disease without esophagitis: Secondary | ICD-10-CM | POA: Diagnosis present

## 2021-07-13 DIAGNOSIS — E785 Hyperlipidemia, unspecified: Secondary | ICD-10-CM | POA: Diagnosis present

## 2021-07-13 DIAGNOSIS — M25552 Pain in left hip: Secondary | ICD-10-CM

## 2021-07-13 DIAGNOSIS — F419 Anxiety disorder, unspecified: Secondary | ICD-10-CM | POA: Diagnosis present

## 2021-07-13 DIAGNOSIS — Z9981 Dependence on supplemental oxygen: Secondary | ICD-10-CM

## 2021-07-13 DIAGNOSIS — J45909 Unspecified asthma, uncomplicated: Secondary | ICD-10-CM | POA: Diagnosis present

## 2021-07-13 DIAGNOSIS — J45998 Other asthma: Secondary | ICD-10-CM | POA: Diagnosis present

## 2021-07-13 DIAGNOSIS — M1990 Unspecified osteoarthritis, unspecified site: Secondary | ICD-10-CM | POA: Diagnosis present

## 2021-07-13 DIAGNOSIS — R072 Precordial pain: Principal | ICD-10-CM | POA: Diagnosis present

## 2021-07-13 LAB — CBC
HCT: 42.5 % (ref 39.0–52.0)
Hemoglobin: 15.3 g/dL (ref 13.0–17.0)
MCH: 32.7 pg (ref 26.0–34.0)
MCHC: 36 g/dL (ref 30.0–36.0)
MCV: 90.8 fL (ref 80.0–100.0)
Platelets: 206 10*3/uL (ref 150–400)
RBC: 4.68 MIL/uL (ref 4.22–5.81)
RDW: 14.5 % (ref 11.5–15.5)
WBC: 12.7 10*3/uL — ABNORMAL HIGH (ref 4.0–10.5)
nRBC: 0 % (ref 0.0–0.2)

## 2021-07-13 LAB — BASIC METABOLIC PANEL
Anion gap: 10 (ref 5–15)
BUN: 12 mg/dL (ref 8–23)
CO2: 25 mmol/L (ref 22–32)
Calcium: 9 mg/dL (ref 8.9–10.3)
Chloride: 104 mmol/L (ref 98–111)
Creatinine, Ser: 1.07 mg/dL (ref 0.61–1.24)
GFR, Estimated: >60 mL/min (ref >60)
Glucose, Bld: 101 mg/dL — ABNORMAL HIGH (ref 70–99)
Potassium: 3.4 mmol/L — ABNORMAL LOW (ref 3.5–5.1)
Sodium: 139 mmol/L (ref 135–145)

## 2021-07-13 LAB — TROPONIN I (HIGH SENSITIVITY)
Troponin I (High Sensitivity): 7 ng/L (ref <18)
Troponin I (High Sensitivity): 11 ng/L (ref <18)

## 2021-07-13 MED ORDER — DUTASTERIDE 0.5 MG PO CAPS
0.5000 mg | ORAL_CAPSULE | Freq: Every day | ORAL | Status: DC
  Administered 2021-07-14 – 2021-07-18 (×5): 0.5 mg via ORAL
  Filled 2021-07-13 (×6): qty 1

## 2021-07-13 MED ORDER — ONDANSETRON HCL 4 MG/2ML IJ SOLN
4.0000 mg | Freq: Once | INTRAMUSCULAR | Status: AC
  Administered 2021-07-13: 4 mg via INTRAVENOUS
  Filled 2021-07-13: qty 2

## 2021-07-13 MED ORDER — ACETAMINOPHEN 500 MG PO TABS
1000.0000 mg | ORAL_TABLET | Freq: Once | ORAL | Status: AC
  Administered 2021-07-13: 1000 mg via ORAL
  Filled 2021-07-13: qty 2

## 2021-07-13 MED ORDER — TRAMADOL-ACETAMINOPHEN 37.5-325 MG PO TABS
1.0000 | ORAL_TABLET | Freq: Every day | ORAL | Status: DC | PRN
  Administered 2021-07-15: 1 via ORAL
  Filled 2021-07-13: qty 1

## 2021-07-13 MED ORDER — LIDOCAINE 5 % EX PTCH
1.0000 | MEDICATED_PATCH | Freq: Once | CUTANEOUS | Status: AC
  Administered 2021-07-13: 1 via TRANSDERMAL
  Filled 2021-07-13: qty 1

## 2021-07-13 MED ORDER — MORPHINE SULFATE (PF) 4 MG/ML IV SOLN
4.0000 mg | Freq: Once | INTRAVENOUS | Status: AC
  Administered 2021-07-13: 4 mg via INTRAVENOUS
  Filled 2021-07-13: qty 1

## 2021-07-13 MED ORDER — METOPROLOL SUCCINATE ER 25 MG PO TB24
25.0000 mg | ORAL_TABLET | Freq: Every day | ORAL | Status: DC
  Administered 2021-07-14 – 2021-07-18 (×5): 25 mg via ORAL
  Filled 2021-07-13 (×5): qty 1

## 2021-07-13 MED ORDER — ATORVASTATIN CALCIUM 20 MG PO TABS
40.0000 mg | ORAL_TABLET | Freq: Every day | ORAL | Status: DC
  Administered 2021-07-14 – 2021-07-18 (×5): 40 mg via ORAL
  Filled 2021-07-13 (×5): qty 2

## 2021-07-13 MED ORDER — ABACAVIR-DOLUTEGRAVIR-LAMIVUD 600-50-300 MG PO TABS
1.0000 | ORAL_TABLET | Freq: Every day | ORAL | Status: DC
  Administered 2021-07-13 – 2021-07-18 (×6): 1 via ORAL
  Filled 2021-07-13 (×6): qty 1

## 2021-07-13 MED ORDER — DULOXETINE HCL 30 MG PO CPEP
30.0000 mg | ORAL_CAPSULE | Freq: Every day | ORAL | Status: DC
  Administered 2021-07-14 – 2021-07-18 (×5): 30 mg via ORAL
  Filled 2021-07-13 (×6): qty 1

## 2021-07-13 MED ORDER — IOHEXOL 350 MG/ML SOLN
100.0000 mL | Freq: Once | INTRAVENOUS | Status: AC | PRN
  Administered 2021-07-13: 100 mL via INTRAVENOUS

## 2021-07-13 MED ORDER — ONDANSETRON HCL 4 MG/2ML IJ SOLN
4.0000 mg | Freq: Four times a day (QID) | INTRAMUSCULAR | Status: DC | PRN
  Administered 2021-07-13: 4 mg via INTRAVENOUS
  Filled 2021-07-13: qty 2

## 2021-07-13 MED ORDER — ASPIRIN 81 MG PO CHEW
162.0000 mg | CHEWABLE_TABLET | Freq: Once | ORAL | Status: AC
  Administered 2021-07-13: 162 mg via ORAL
  Filled 2021-07-13: qty 2

## 2021-07-13 MED ORDER — ENOXAPARIN SODIUM 40 MG/0.4ML IJ SOSY
40.0000 mg | PREFILLED_SYRINGE | INTRAMUSCULAR | Status: DC
  Administered 2021-07-13 – 2021-07-17 (×5): 40 mg via SUBCUTANEOUS
  Filled 2021-07-13 (×5): qty 0.4

## 2021-07-13 MED ORDER — SENNOSIDES-DOCUSATE SODIUM 8.6-50 MG PO TABS
1.0000 | ORAL_TABLET | Freq: Every day | ORAL | Status: DC
  Administered 2021-07-13 – 2021-07-17 (×5): 1 via ORAL
  Filled 2021-07-13 (×5): qty 1

## 2021-07-13 MED ORDER — KETOTIFEN FUMARATE 0.025 % OP SOLN
1.0000 [drp] | Freq: Two times a day (BID) | OPHTHALMIC | Status: DC
Start: 2021-07-13 — End: 2021-07-18
  Administered 2021-07-14 – 2021-07-18 (×9): 1 [drp] via OPHTHALMIC
  Filled 2021-07-13 (×2): qty 5

## 2021-07-13 MED ORDER — ALBUTEROL SULFATE HFA 108 (90 BASE) MCG/ACT IN AERS
2.0000 | INHALATION_SPRAY | Freq: Four times a day (QID) | RESPIRATORY_TRACT | Status: DC | PRN
  Administered 2021-07-14: 2 via RESPIRATORY_TRACT
  Filled 2021-07-13 (×2): qty 6.7

## 2021-07-13 MED ORDER — TAMSULOSIN HCL 0.4 MG PO CAPS
0.4000 mg | ORAL_CAPSULE | Freq: Every day | ORAL | Status: DC
  Administered 2021-07-14 – 2021-07-18 (×5): 0.4 mg via ORAL
  Filled 2021-07-13 (×5): qty 1

## 2021-07-13 MED ORDER — CLONAZEPAM 0.5 MG PO TABS
0.2500 mg | ORAL_TABLET | Freq: Two times a day (BID) | ORAL | Status: DC | PRN
  Administered 2021-07-14 – 2021-07-18 (×3): 0.25 mg via ORAL
  Filled 2021-07-13 (×4): qty 1

## 2021-07-13 MED ORDER — HYDROMORPHONE HCL 1 MG/ML IJ SOLN
1.0000 mg | INTRAMUSCULAR | Status: DC | PRN
  Administered 2021-07-13 – 2021-07-15 (×6): 1 mg via INTRAVENOUS
  Filled 2021-07-13 (×6): qty 1

## 2021-07-13 MED ORDER — PANTOPRAZOLE SODIUM 40 MG PO TBEC
40.0000 mg | DELAYED_RELEASE_TABLET | Freq: Every day | ORAL | Status: DC
  Administered 2021-07-14 – 2021-07-18 (×5): 40 mg via ORAL
  Filled 2021-07-13 (×5): qty 1

## 2021-07-13 MED ORDER — POTASSIUM CHLORIDE CRYS ER 20 MEQ PO TBCR
40.0000 meq | EXTENDED_RELEASE_TABLET | Freq: Once | ORAL | Status: AC
  Administered 2021-07-13: 40 meq via ORAL
  Filled 2021-07-13: qty 2

## 2021-07-13 MED ORDER — ACETAMINOPHEN 325 MG PO TABS: 650.0000 mg | ORAL_TABLET | Freq: Four times a day (QID) | ORAL | Status: DC | PRN

## 2021-07-13 NOTE — ED Triage Notes (Signed)
Pt reports that he is having, chest pain  that radiates to his back with SHOB, no Nausea or diaphoresis. He is also complaining of left foot pain no known injury, hurts when he bears weight.

## 2021-07-13 NOTE — H&P (Addendum)
History and Physical  Drakkar Medeiros XHB:[id number] DOB: 1941-05-29 DOA: 07/13/2021  Referring physician: Dr. Adaline Sill, EDP  PCP: Armando Gang, FNP  Outpatient Specialists: Infectious disease Patient coming from: Home.  Chief Complaint: Chest pain  HPI: Brian [name] is a 80 y.o. male with medical history significant for HIV/AIDS, pacemaker for symptomatic bradycardia, hyperlipidemia, who presented to [facility] ED from home with complaints of chest pain, substernal, radiating to his thoracic back.  Associated with dyspnea on exertion.  Also reported left lower extremity pain with difficulty bearing weight for the past 3 days.  No falls.  He went to his primary care physician today with the same complaints.  He was advised to go to the ED for further evaluation.  Work-up in the ED was unrevealing, troponin negative x2.  CTA chest/AP negative for PE, negative for dissection.  His chest pain persisted in the ED despite IV morphine.  EDP requested admission for symptoms management.  ED Course: Temperature 99.5.  BP 122/83, pulse 81, respiration rate 15, O2 saturation 94% on room air.  Lab studies remarkable for serum potassium 3.4.  Troponin 11.7.  WBC 12.7.  Review of Systems: Review of systems as noted in the HPI. All other systems reviewed and are negative.   Past Medical History:  Diagnosis Date   Arthritis    Asthma    Bradycardia    Depression    HIV (human immunodeficiency virus infection) ([facility])    Hyperlipidemia    Past Surgical History:  Procedure Laterality Date   cataracts     EYE SURGERY     PACEMAKER INSERTION N/A 08/02/2015   Procedure: INSERTION PACEMAKER;  Surgeon: Corky Downs, MD;  Location: [name];  Service: Cardiovascular;  Laterality: N/A;    Social History:  reports that he has never smoked. He has never used smokeless tobacco. He reports current alcohol use of about 2.0 standard drinks per week. He reports that he does not use drugs.   No Known  Allergies  Family History  Problem Relation Age of Onset   Hypertension Other       Prior to Admission medications   Medication Sig Start Date End Date Taking? Authorizing Provider  abacavir-dolutegravir-lamiVUDine (TRIUMEQ) 600-50-300 MG tablet Take 1 tablet by mouth daily. 03/19/21  Yes Kuppelweiser, Cassie L, RPH-CPP  atorvastatin (LIPITOR) 40 MG tablet Take 40 mg by mouth daily.   Yes [provider]  azelastine (OPTIVAR) 0.05 % ophthalmic solution Apply 1 drop to eye 2 (two) times daily. 03/28/21  Yes [provider]  clonazePAM (KLONOPIN) 0.5 MG tablet Take 0.5 mg by mouth 2 (two) times daily as needed for anxiety.   Yes [provider]  DULoxetine (CYMBALTA) 30 MG capsule Take 30 mg by mouth daily. 08/14/20  Yes [provider]  dutasteride (AVODART) 0.5 MG capsule Take 1 capsule (0.5 mg total) by mouth daily. 10/25/20  Yes Sondra Come, MD  metoprolol succinate (TOPROL-XL) 25 MG 24 hr tablet Take 1 tablet (25 mg total) by mouth daily. 08/31/20  Yes Masoud, Renda Rolls, MD  nystatin-triamcinolone ointment (MYCOLOG) Apply topically 2 (two) times daily. 07/05/21  Yes Sondra Come, MD  omeprazole (PRILOSEC) 20 MG capsule Take 20 mg by mouth daily. 07/21/15  Yes [provider]  tamsulosin (FLOMAX) 0.4 MG CAPS capsule Take 1 capsule (0.4 mg total) by mouth daily. 10/25/20  Yes Sondra Come, MD  traMADol-acetaminophen (ULTRACET) 37.5-325 MG tablet Take 1 tablet by mouth daily as needed. 03/29/21  Yes [provider]  alendronate (FOSAMAX) 70 MG tablet Take 70 mg by mouth once a week. Take on Saturday. 07/21/15   [provider]  loratadine (CLARITIN) 10 MG tablet Take 10 mg by mouth daily.    [provider]  triamcinolone cream (KENALOG) 0.1 % Apply 1 application topically 2 (two) times daily. 06/25/21   [provider]  VENTOLIN HFA 108 (90 BASE) MCG/ACT inhaler Inhale 2 puffs into the lungs 4 (four) times daily  as needed. For shortness of breath and/or wheezing. 07/21/15   [provider]    Physical Exam: BP 122/83   Pulse 81   Temp 99.5 F (37.5 C) (Oral)   Resp 15   Ht 5\' 6"  (1.676 m)   Wt 73 kg   SpO2 94%   BMI 25.98 kg/m   General: 80 y.o. year-old male well developed well nourished in no acute distress.  Alert and oriented x3.  Appears uncomfortable due to chest pain. Cardiovascular: Regular rate and rhythm with no rubs or gallops.  No thyromegaly or JVD noted.  No lower extremity edema. 2/4 pulses in all 4 extremities.  Tenderness on palpation of chest wall. Respiratory: Clear to auscultation with no wheezes or rales. Good inspiratory effort. Abdomen: Soft nontender nondistended with normal bowel sounds x4 quadrants. Muskuloskeletal: No cyanosis, clubbing or edema noted bilaterally Neuro: CN II-XII intact, strength, sensation, reflexes Skin: No ulcerative lesions noted or rashes Psychiatry: Judgement and insight appear normal. Mood is appropriate for condition and setting          Labs on Admission:  Basic Metabolic Panel: Recent Labs  Lab 07/13/21 1250  NA 139  K 3.4*  CL 104  CO2 25  GLUCOSE 101*  BUN 12  CREATININE 1.07  CALCIUM 9.0   Liver Function Tests: No results for input(s): AST, ALT, ALKPHOS, BILITOT, PROT, ALBUMIN in the last 168 hours. No results for input(s): LIPASE, AMYLASE in the last 168 hours. No results for input(s): AMMONIA in the last 168 hours. CBC: Recent Labs  Lab 07/13/21 1250  WBC 12.7*  HGB 15.3  HCT 42.5  MCV 90.8  PLT 206   Cardiac Enzymes: No results for input(s): CKTOTAL, CKMB, CKMBINDEX, TROPONINI in the last 168 hours.  BNP (last 3 results) No results for input(s): BNP in the last 8760 hours.  ProBNP (last 3 results) No results for input(s): PROBNP in the last 8760 hours.  CBG: No results for input(s): GLUCAP in the last 168 hours.  Radiological Exams on Admission: DG Chest 2 View  Result Date:  07/13/2021 CLINICAL DATA:  Chest pain EXAM: CHEST - 2 VIEW COMPARISON:  03/08/2021 FINDINGS: Cardiomegaly with left chest multi lead pacer. Both lungs are clear. The visualized skeletal structures are unremarkable. IMPRESSION: Cardiomegaly without acute abnormality of the lungs. Electronically Signed   By: 05/08/2021 M.D.   On: 07/13/2021 13:45   DG Ankle Complete Left  Result Date: 07/13/2021 CLINICAL DATA:  Foot and ankle pain EXAM: LEFT ANKLE COMPLETE - 3+ VIEW COMPARISON:  None. FINDINGS: There is no evidence of fracture, dislocation, or joint effusion. There is no evidence of arthropathy or other focal bone abnormality. Soft tissues are unremarkable. IMPRESSION: Negative. Electronically Signed   By: 07/15/2021 M.D.   On: 07/13/2021 15:23   DG Foot Complete Left  Result Date: 07/13/2021 CLINICAL DATA:  Foot and ankle pain EXAM: LEFT FOOT - COMPLETE 3+ VIEW COMPARISON:  None. FINDINGS: No fracture or malalignment. Mild degenerative changes at the first  MTP joint. Mild vascular calcifications. IMPRESSION: No acute osseous abnormality Electronically Signed   By: Jasmine Pang M.D.   On: 07/13/2021 15:23   CT Angio Chest/Abd/Pel for Dissection W and/or Wo Contrast  Result Date: 07/13/2021 CLINICAL DATA:  Abdominal pain, chest pain, suspected dissection EXAM: CT ANGIOGRAPHY CHEST, ABDOMEN AND PELVIS TECHNIQUE: Multidetector CT imaging through the chest, abdomen and pelvis was performed using the standard protocol during bolus administration of intravenous contrast. Multiplanar reconstructed images and MIPs were obtained and reviewed to evaluate the vascular anatomy. CONTRAST:  OMNIPAQUE IOHEXOL 350 MG/ML SOLN COMPARISON:  04/24/2021 and previous FINDINGS: CTA CHEST FINDINGS Cardiovascular: No hyperdense intramural hematoma on the precontrast images. Left subclavian transvenous pacing leads extend to the right atrium and coronary sinus. The RV is nondilated. Satisfactory opacification of  pulmonary arteries noted, and there is no evidence of pulmonary emboli. There is good contrast opacification of the thoracic aorta. 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 This extends over length of approximately 2.5 cm, with the depth of approximately 6 mm. No evidence of dissection or stenosis. Scattered calcified atheromatous plaque in ascending, arch, and descending segments. Bovine variant brachiocephalic arterial origin anatomy without proximal stenosis. Aortic Root: --Valve: 2.5 cm --Sinuses: 4.3 cm --Sinotubular Junction: 3.2 cm Limitations by motion: Moderate Thoracic Aorta: --Ascending Aorta: 4 cm --Aortic Arch: 4 cm --Descending Aorta: 3.4 cm Mediastinum/Nodes: No mediastinal hematoma.  No mass or adenopathy. Lungs/Pleura: No pleural effusion. No pneumothorax. Persistent irregular bandlike ground-glass opacities peripherally with some subpleural sparing. 1.1 cm nodular opacity in the inferior lingula, stable since previous but new since 04/02/2017. Musculoskeletal: Anterior vertebral endplate spurring at multiple levels in the lower thoracic spine. Review of the MIP images confirms the above findings. CTA ABDOMEN AND PELVIS FINDINGS VASCULAR Aorta: Calcified atheromatous plaque throughout. No aneurysm, dissection, or stenosis. Celiac: Patent without evidence of aneurysm, dissection, vasculitis or significant stenosis. SMA: Patent without evidence of aneurysm, dissection, vasculitis or significant stenosis. Renals: Single right, widely patent. Duplicated left, superior dominant, both patent. IMA: Patent without evidence of aneurysm, dissection, vasculitis or significant stenosis. Veins: No obvious venous abnormality within the limitations of this arterial phase study. Review of the MIP images confirms the above findings. NON-VASCULAR Hepatobiliary: No focal liver abnormality is seen. No gallstones, gallbladder wall thickening, or biliary dilatation. Pancreas:  Unremarkable. No pancreatic ductal dilatation or surrounding inflammatory changes. Spleen: Normal in size without focal abnormality. Adrenals/Urinary Tract: Kidneys unremarkable. Symmetric renal enhancement without focal lesion. No urolithiasis. The urinary bladder is distended, thick-walled, with a left lateral diverticulum.Urethral stent. Stomach/Bowel: Stomach is incompletely distended. The small bowel is decompressed. Appendix not identified. The colon is nondilated with a few scattered diverticula. Abrupt caliber change in the distal sigmoid segment with some apparent eccentric circumferential wall thickening. No adjacent inflammatory change. Lymphatic: Subcentimeter left para-aortic lymph nodes. No mesenteric or pelvic adenopathy. Reproductive: Prostate enlargement with scattered coarse calcifications. Other: No ascites.  No free air. Musculoskeletal: Mild spondylitic changes in the lumbar spine. No fracture or worrisome bone lesion. Review of the MIP images confirms the above findings. IMPRESSION: 1. 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 2. 4 cm ascending thoracic aortic aneurysm. Recommend annual imaging followup by CTA or MRA. This recommendation follows 2010 ACCF/AHA/AATS/ACR/ASA/SCA/SCAI/SIR/STS/SVM Guidelines for the Diagnosis and Management of Patients with Thoracic Aortic Disease. Circulation. 2010; 121: Z610-R604 3. Caliber change in the distal sigmoid colon with apparent segmental circumferential  wall thickening, which may be mimicked by under distension. Consider colonoscopy or barium enema to exclude neoplasm. Electronically Signed   By: Corlis Leak M.D.   On: 07/13/2021 16:48    EKG: I independently viewed the EKG done and my findings are as followed: Sinus rhythm rate of 63.  Nonspecific ST-T changes.  QTc 415.  Assessment/Plan Present on Admission:  Chest  pain  Active Problems:   Chest pain  Chest pain rule out ACS Presented with chest pain, substernal, severe radiated to thoracic back. 2 sets of troponin negative No evidence of acute ischemia on twelve-lead EKG. Obtain 2D echo Monitor on telemetry  HIV/AIDS Resume HAART He follows with infectious disease  Chronic anxiety/depression Resume home duloxetine and home as needed Klonopin  Hyperlipidemia Resume home Lipitor.  BPH Resume home regimen Monitor for urinary retention  GERD Resume PPI  Left lower extremity pain Imaging no evidence of acute fracture No edema PT OT with fall precautions If no improvement may consider MRI   DVT prophylaxis: Subcu Lovenox daily  Code Status: Full code  Family Communication: Grandson at bedside  Disposition Plan: Admit to progressive cardiac unit.  Consults called: None  Admission status: Observation status   Status is: Observation    Dispo: The patient is from: Home.              Anticipated d/c is to: Home.              Patient currently not medically stable for discharge   Difficult to place patient, not applicable       Darlin Drop MD [facility] Pager [phone]  If 7PM-7AM, please contact night-coverage [email or web address] Password [facility]  07/13/2021, 8:24 PM

## 2021-07-13 NOTE — ED Provider Notes (Signed)
[facility] Emergency Department Provider Note ____________________________________________   Event Date/Time   First MD Initiated Contact with Patient 07/13/21 1351     (approximate)  I have reviewed the triage vital signs and the nursing notes.   HISTORY  Chief Complaint Chest Pain (Pt )    HPI Brian [name] is a 80 y.o. male with PMH as noted below including HIV and bradycardia status post pacemaker placement who presents with chest pain, acute onset this morning, described as squeezing, mainly right-sided, and radiating to the right side of his back.  He reports some associated shortness of breath but denies nausea or lightheadedness.  He denies any prior history of this pain.  In addition he has had left foot and ankle pain over the last 3 days gradual onset, and not preceded by any trauma.  He states that the pain is bad enough that he can now only put minimal weight on it.  Past Medical History:  Diagnosis Date   Arthritis    Asthma    Bradycardia    Depression    HIV (human immunodeficiency virus infection) (HCC)    Hyperlipidemia     Patient Active Problem List   Diagnosis Date Noted   Essential hypertension 08/31/2020   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   Peripheral neuropathy 05/03/2020   COPD (chronic obstructive pulmonary disease) (HCC) 11/27/2018   Syncope 07/30/2015   AKI (acute kidney injury) (HCC) 07/30/2015   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   Sleep apnea 07/30/2015   HIV (human immunodeficiency virus infection) (HCC) 07/30/2015   Depression 07/30/2015   BPH (benign prostatic hyperplasia) 07/30/2015    Past Surgical History:  Procedure Laterality Date   cataracts     EYE SURGERY     PACEMAKER INSERTION N/A 08/02/2015   Procedure: INSERTION PACEMAKER;  Surgeon: Corky Downs, MD;  Location: [name];  Service: Cardiovascular;  Laterality: N/A;    Prior to Admission medications   Medication Sig Start Date End Date Taking? Authorizing  Provider  abacavir-dolutegravir-lamiVUDine (TRIUMEQ) 600-50-300 MG tablet Take 1 tablet by mouth daily. 03/19/21   Kuppelweiser, Cassie L, RPH-CPP  alendronate (FOSAMAX) 70 MG tablet Take 70 mg by mouth once a week. Take on Saturday. 07/21/15   [provider]  atorvastatin (LIPITOR) 40 MG tablet Take 40 mg by mouth daily.    [provider]  azelastine (OPTIVAR) 0.05 % ophthalmic solution Apply 1 drop to eye 2 (two) times daily. 03/28/21   [provider]  clonazePAM (KLONOPIN) 0.5 MG tablet Take 0.5 mg by mouth 2 (two) times daily as needed for anxiety.    [provider]  DULoxetine (CYMBALTA) 30 MG capsule Take 30 mg by mouth daily. 08/14/20   [provider]  dutasteride (AVODART) 0.5 MG capsule Take 1 capsule (0.5 mg total) by mouth daily. 10/25/20   Sondra Come, MD  loratadine (CLARITIN) 10 MG tablet Take 10 mg by mouth daily.    [provider]  metoprolol succinate (TOPROL-XL) 25 MG 24 hr tablet Take 1 tablet (25 mg total) by mouth daily. 08/31/20   Corky Downs, MD  nystatin-triamcinolone ointment (MYCOLOG) Apply topically 2 (two) times daily. 07/05/21   Sondra Come, MD  omeprazole (PRILOSEC) 20 MG capsule Take 20 mg by mouth daily. 07/21/15   [provider]  tamsulosin (FLOMAX) 0.4 MG CAPS capsule Take 1 capsule (0.4 mg total) by mouth daily. 10/25/20   Sondra Come, MD  traMADol-acetaminophen (ULTRACET) 37.5-325 MG tablet Take 1 tablet by  mouth daily as needed. 03/29/21   [provider]  VENTOLIN HFA 108 (90 BASE) MCG/ACT inhaler Inhale 2 puffs into the lungs 4 (four) times daily as needed. For shortness of breath and/or wheezing. 07/21/15   [provider]    Allergies Patient has no known allergies.  Family History  Problem Relation Age of Onset   Hypertension Other     Social History Social History   Tobacco Use   Smoking status: Never   Smokeless tobacco: Never  Vaping Use   Vaping  Use: Never used  Substance Use Topics   Alcohol use: Yes    Alcohol/week: 2.0 standard drinks    Types: 2 Cans of beer per week    Comment: 1-2 drinks a day   Drug use: No    Review of Systems  Constitutional: No fever. Eyes: No visual changes. ENT: No sore throat. Cardiovascular: Positive for chest pain. Respiratory: Positive for shortness of breath. Gastrointestinal: No vomiting or diarrhea.  Genitourinary: Negative for dysuria.  Musculoskeletal: Positive for back and left foot pain. Skin: Negative for rash. Neurological: Negative for headaches, focal weakness or numbness.   ____________________________________________   PHYSICAL EXAM:  VITAL SIGNS: ED Triage Vitals  Enc Vitals Group     BP 07/13/21 1242 (!) 168/80     Pulse Rate 07/13/21 1242 62     Resp 07/13/21 1242 20     Temp 07/13/21 1242 98.6 F (37 C)     Temp Source 07/13/21 1242 Oral     SpO2 07/13/21 1242 99 %     Weight 07/13/21 1243 160 lb 15 oz (73 kg)     Height 07/13/21 1243 5\' 6"  (1.676 m)     Head Circumference --      Peak Flow --      Pain Score 07/13/21 1243 9     Pain Loc --      Pain Edu? --      Excl. in GC? --     Constitutional: Alert and oriented.  Uncomfortable appearing but in no acute distress. Eyes: Conjunctivae are normal.  EOMI. Head: Atraumatic. Nose: No congestion/rhinnorhea. Mouth/Throat: Mucous membranes are moist.   Neck: Normal range of motion.  Cardiovascular: Normal rate, regular rhythm. Grossly normal heart sounds.  Good peripheral circulation. Respiratory: Normal respiratory effort.  No retractions. Lungs CTAB. Gastrointestinal: Soft and nontender. No distention.  Genitourinary: No blank tenderness. Musculoskeletal: No midline spinal tenderness.  No lower extremity edema.  Extremities warm and well perfused.  Minimal swelling to the left medial malleolus with tenderness to bilateral malleoli and the dorsum of the foot.  2+ DP pulse.  Cap refill less than 2 seconds.   Full range of motion at all joints.  No calf or popliteal swelling or tenderness. Neurologic:  Normal speech and language. No gross focal neurologic deficits are appreciated.  Skin:  Skin is warm and dry. No rash noted. Psychiatric: Mood and affect are normal. Speech and behavior are normal.  ____________________________________________   LABS (all labs ordered are listed, but only abnormal results are displayed)  Labs Reviewed  BASIC METABOLIC PANEL - Abnormal; Notable for the following components:      Result Value   Potassium 3.4 (*)    Glucose, Bld 101 (*)    All other components within normal limits  CBC - Abnormal; Notable for the following components:   WBC 12.7 (*)    All other components within normal limits  TROPONIN I (HIGH SENSITIVITY)  TROPONIN I (  HIGH SENSITIVITY)   ____________________________________________  EKG  ED ECG REPORT I, Dionne Bucy, the attending physician, personally viewed and interpreted this ECG.  Date: 07/13/2021 EKG Time: 1233 Rate: 63 Rhythm: normal sinus rhythm QRS Axis: normal Intervals: RBBB ST/T Wave abnormalities: Nonspecific T wave abnormalities Narrative Interpretation: no evidence of acute ischemia; slightly more prominent T wave inversions when compared to EKG of 05/23/2021   ED ECG REPORT I, Dionne Bucy, the attending physician, personally viewed and interpreted this ECG.  Date: 07/13/2021 EKG Time: 1405 Rate: 63 Rhythm: normal sinus rhythm QRS Axis: normal Intervals: Incomplete RBBB, LAFB ST/T Wave abnormalities: Nonspecific T wave abnormalities Narrative Interpretation: no evidence of acute ischemia; no dynamic changes when compared to EKG of 1233 today   ____________________________________________  RADIOLOGY  Chest x-ray interpreted by me shows no focal consolidation or edema XR L ankle: No acute fracture XR L foot: No acute fracture CT angio chest/abdomen/pelvis:  Pending  ____________________________________________   PROCEDURES  Procedure(s) performed: No  Procedures  Critical Care performed: No ____________________________________________   INITIAL IMPRESSION / ASSESSMENT AND PLAN / ED COURSE  Pertinent labs & imaging results that were available during my care of the patient were reviewed by me and considered in my medical decision making (see chart for details).   80 year old male with PMH as noted above including bradycardia status post pacemaker placement (but no known CAD) presents with acute onset of right-sided chest pain radiating to the back since this morning as well as atraumatic left foot pain over the last 3 days.  On exam the patient is uncomfortable appearing but in no acute distress.  He is hypertensive with otherwise normal vital signs.  He has tenderness to the left ankle but no significant effusion and good range of motion.  There are no cutaneous findings.  He has no reproducible chest wall tenderness, but the right upper part of the back is slightly tender.  Chest pain: Differential includes musculoskeletal pain, GERD, ACS, aortic dissection, PE.  Chest x-ray is unremarkable.  EKG shows some nonspecific T wave inversions.  We will obtain cardiac enzymes x2 and obtain a CT angio to rule out dissection or PE.  Left foot pain: Differential includes stress fracture, gout, sprain.  There is no evidence of cellulitis.  There is no evidence of DVT.  We will obtain x-rays and give analgesia.  ----------------------------------------- 3:50 PM on 07/13/2021 -----------------------------------------  Imaging and repeat troponin are pending.  I have signed the patient out to the oncoming ED physician Dr. Katrinka Blazing.  ____________________________________________   FINAL CLINICAL IMPRESSION(S) / ED DIAGNOSES  Final diagnoses:  Left foot pain  Nonspecific chest pain      NEW MEDICATIONS STARTED DURING THIS VISIT:  New  Prescriptions   No medications on file     Note:  This document was prepared using Dragon voice recognition software and may include unintentional dictation errors.    Dionne Bucy, MD 07/13/21 1558

## 2021-07-14 ENCOUNTER — Other Ambulatory Visit: Payer: Self-pay

## 2021-07-14 ENCOUNTER — Observation Stay (HOSPITAL_BASED_OUTPATIENT_CLINIC_OR_DEPARTMENT_OTHER)
Admit: 2021-07-14 | Discharge: 2021-07-14 | Disposition: A | Discharge status: Home / Self Care | Payer: Medicare Other | Attending: Internal Medicine | Admitting: Internal Medicine

## 2021-07-14 DIAGNOSIS — R079 Chest pain, unspecified: Secondary | ICD-10-CM | POA: Diagnosis present

## 2021-07-14 DIAGNOSIS — N400 Enlarged prostate without lower urinary tract symptoms: Secondary | ICD-10-CM | POA: Diagnosis present

## 2021-07-14 DIAGNOSIS — M1990 Unspecified osteoarthritis, unspecified site: Secondary | ICD-10-CM | POA: Diagnosis present

## 2021-07-14 DIAGNOSIS — Z950 Presence of cardiac pacemaker: Secondary | ICD-10-CM | POA: Diagnosis not present

## 2021-07-14 DIAGNOSIS — E785 Hyperlipidemia, unspecified: Secondary | ICD-10-CM | POA: Diagnosis present

## 2021-07-14 DIAGNOSIS — J9611 Chronic respiratory failure with hypoxia: Secondary | ICD-10-CM | POA: Diagnosis present

## 2021-07-14 DIAGNOSIS — M549 Dorsalgia, unspecified: Secondary | ICD-10-CM | POA: Diagnosis present

## 2021-07-14 DIAGNOSIS — R072 Precordial pain: Secondary | ICD-10-CM | POA: Diagnosis not present

## 2021-07-14 DIAGNOSIS — R0789 Other chest pain: Secondary | ICD-10-CM | POA: Diagnosis present

## 2021-07-14 DIAGNOSIS — B20 Human immunodeficiency virus [HIV] disease: Secondary | ICD-10-CM | POA: Diagnosis present

## 2021-07-14 DIAGNOSIS — R001 Bradycardia, unspecified: Secondary | ICD-10-CM | POA: Diagnosis present

## 2021-07-14 DIAGNOSIS — M79604 Pain in right leg: Secondary | ICD-10-CM | POA: Diagnosis not present

## 2021-07-14 DIAGNOSIS — F32A Depression, unspecified: Secondary | ICD-10-CM | POA: Diagnosis present

## 2021-07-14 DIAGNOSIS — Z20822 Contact with and (suspected) exposure to covid-19: Secondary | ICD-10-CM | POA: Diagnosis present

## 2021-07-14 DIAGNOSIS — I10 Essential (primary) hypertension: Secondary | ICD-10-CM | POA: Diagnosis present

## 2021-07-14 DIAGNOSIS — Z8249 Family history of ischemic heart disease and other diseases of the circulatory system: Secondary | ICD-10-CM | POA: Diagnosis not present

## 2021-07-14 DIAGNOSIS — L03115 Cellulitis of right lower limb: Secondary | ICD-10-CM | POA: Diagnosis present

## 2021-07-14 DIAGNOSIS — I712 Thoracic aortic aneurysm, without rupture: Secondary | ICD-10-CM | POA: Diagnosis present

## 2021-07-14 DIAGNOSIS — K219 Gastro-esophageal reflux disease without esophagitis: Secondary | ICD-10-CM | POA: Diagnosis present

## 2021-07-14 DIAGNOSIS — F419 Anxiety disorder, unspecified: Secondary | ICD-10-CM | POA: Diagnosis present

## 2021-07-14 DIAGNOSIS — R3914 Feeling of incomplete bladder emptying: Secondary | ICD-10-CM

## 2021-07-14 DIAGNOSIS — J42 Unspecified chronic bronchitis: Secondary | ICD-10-CM | POA: Diagnosis present

## 2021-07-14 DIAGNOSIS — Z9981 Dependence on supplemental oxygen: Secondary | ICD-10-CM | POA: Diagnosis not present

## 2021-07-14 DIAGNOSIS — I454 Nonspecific intraventricular block: Secondary | ICD-10-CM | POA: Diagnosis present

## 2021-07-14 DIAGNOSIS — N401 Enlarged prostate with lower urinary tract symptoms: Secondary | ICD-10-CM | POA: Diagnosis not present

## 2021-07-14 LAB — COMPREHENSIVE METABOLIC PANEL
ALT: 18 U/L (ref 0–44)
AST: 27 U/L (ref 15–41)
Albumin: 4.2 g/dL (ref 3.5–5.0)
Alkaline Phosphatase: 49 U/L (ref 38–126)
Anion gap: 13 (ref 5–15)
BUN: 10 mg/dL (ref 8–23)
CO2: 26 mmol/L (ref 22–32)
Calcium: 9.2 mg/dL (ref 8.9–10.3)
Chloride: 96 mmol/L — ABNORMAL LOW (ref 98–111)
Creatinine, Ser: 0.98 mg/dL (ref 0.61–1.24)
GFR, Estimated: >60 mL/min (ref >60)
Glucose, Bld: 127 mg/dL — ABNORMAL HIGH (ref 70–99)
Potassium: 3.6 mmol/L (ref 3.5–5.1)
Sodium: 135 mmol/L (ref 135–145)
Total Bilirubin: 1.8 mg/dL — ABNORMAL HIGH (ref 0.3–1.2)
Total Protein: 8.8 g/dL — ABNORMAL HIGH (ref 6.5–8.1)

## 2021-07-14 LAB — ECHOCARDIOGRAM COMPLETE
AR max vel: 1.9 cm2
AV Peak grad: 7.4 mmHg
Ao pk vel: 1.36 m/s
Area-P 1/2: 3.77 cm2
Height: 66 in
S' Lateral: 3.4 cm
Weight: 2574.97 oz

## 2021-07-14 LAB — CBC
HCT: 45.5 % (ref 39.0–52.0)
Hemoglobin: 16.5 g/dL (ref 13.0–17.0)
MCH: 32.5 pg (ref 26.0–34.0)
MCHC: 36.3 g/dL — ABNORMAL HIGH (ref 30.0–36.0)
MCV: 89.6 fL (ref 80.0–100.0)
Platelets: 197 10*3/uL (ref 150–400)
RBC: 5.08 MIL/uL (ref 4.22–5.81)
RDW: 14.3 % (ref 11.5–15.5)
WBC: 14.6 10*3/uL — ABNORMAL HIGH (ref 4.0–10.5)
nRBC: 0 % (ref 0.0–0.2)

## 2021-07-14 LAB — LIPID PANEL
Cholesterol: 208 mg/dL — ABNORMAL HIGH (ref 0–200)
HDL: 54 mg/dL (ref >40)
LDL Cholesterol: 132 mg/dL — ABNORMAL HIGH (ref 0–99)
Total CHOL/HDL Ratio: 3.9 RATIO
Triglycerides: 110 mg/dL (ref <150)
VLDL: 22 mg/dL (ref 0–40)

## 2021-07-14 LAB — RESP PANEL BY RT-PCR (FLU A&B, COVID) ARPGX2
Influenza A by PCR: NEGATIVE
Influenza B by PCR: NEGATIVE
SARS Coronavirus 2 by RT PCR: NEGATIVE

## 2021-07-14 LAB — AMYLASE: Amylase: 27 U/L — ABNORMAL LOW (ref 28–100)

## 2021-07-14 LAB — LIPASE, BLOOD: Lipase: 26 U/L (ref 11–51)

## 2021-07-14 LAB — MAGNESIUM: Magnesium: 2.1 mg/dL (ref 1.7–2.4)

## 2021-07-14 MED ORDER — LACTATED RINGERS IV SOLN: INTRAVENOUS | Status: DC

## 2021-07-14 NOTE — ED Notes (Signed)
Continue to await response from dr. Para March regarding hypertension.

## 2021-07-14 NOTE — ED Notes (Signed)
Lab notified of need for venipuncture assist.  

## 2021-07-14 NOTE — Evaluation (Signed)
Physical Therapy Evaluation Patient Details Name: Brian [name] MRN: [id number] DOB: [birth year] Today's Date: 07/14/2021  History of Present Illness  80 y.o. male with medical history significant for HIV/AIDS, pacemaker for symptomatic bradycardia, hyperlipidemia, who presented to [facility] ED from home with complaints of chest pain, substernal, radiating to his thoracic back.  Associated with dyspnea on exertion.  Also reported left lower extremity pain with difficulty bearing weight for the past 3 days.  No falls.  He went to his primary care physician today with the same complaints.  He was advised to go to the ED for further evaluation.  Work-up in the ED was unrevealing, troponin negative x2.  CTA chest/AP negative for PE, negative for dissection.  His chest pain persisted in the ED despite IV morphine.   Clinical Impression  Patient received reclining in ED stretcher with grandson [name] at bedside. Pt needs Spanish interpreter and requests [name] interpret for him. Patient lives alone and was I with all aspects of care and mobility prior to hospitalization. He is planning to stay with his grandson [name] upon discharge but they both were concerned he is not currently moving well enough to go home. Patient reported 9/10 pain at the chest and right medial lower leg, worse with weight bearing and exertion and better at rest. Patient utilized 2L/min O2 throughout session and vitals remained WFL. Upon PT evaluation, patient required mod A for bed mobility, CGA for sit <> stand from elevated surface (ED stretcher) to RW, and CGA for ambulation ~ 15 feet with RW. Patient appeared to be struggling with and most limited by pain throughout session. Ambulated extremely slowly with stooped posture and short step lengths, allowing RW to get too far in front of him and crooked which increases fall risk. Patient requested to urinate upon standing and while being assisted was found to use a tube he keeps in his underwear and  inserts into his urethra to urinate (states he does this sometimes). Nursing notified of this. Patient educated on lack of safety due to infection risk. Patient appears to have experienced a significant decline in functional mobility and independence and lacks necessary functional mobility at this time to discharge home safely. Therefore, PT recommends short term rehab prior to returning home at this time. However, based on PLOF patient has good potential to improve in functional ability significantly if his pain can be adequately controlled. Patient would benefit from skilled physical therapy to address impairments and functional limitations (see PT Problem List below) to work towards stated goals and return to PLOF or maximal functional independence.       Recommendations for follow up therapy are one component of a multi-disciplinary discharge planning process, led by the attending physician.  Recommendations may be updated based on patient status, additional functional criteria and insurance authorization.  Follow Up Recommendations SNF    Equipment Recommendations  Rolling walker with 5" wheels;3in1 (PT)    Recommendations for Other Services OT consult     Precautions / Restrictions Precautions Precautions: Fall      Mobility  Bed Mobility Overal bed mobility: Needs Assistance Bed Mobility: Supine to Sit;Sit to Supine     Supine to sit: Mod assist Sit to supine: Mod assist   General bed mobility comments: Required assist at trunk and B LE during supine to sit due to difficulty moving with pain.    Transfers Overall transfer level: Needs assistance Equipment used: Rolling walker (2 wheeled) Transfers: Sit to/from Stand Sit to Stand: From elevated surface;Min  guard         General transfer comment: Pateint completed sit <> stand at edge of elevated surface (ED stretcher) to RW with CGA  Ambulation/Gait Ambulation/Gait assistance: Min guard Gait Distance (Feet): 15  Feet Assistive device: Rolling walker (2 wheeled) Gait Pattern/deviations: Trunk flexed;Decreased stance time - right Gait velocity: extremely slow   General Gait Details: Patient ambulated ~ 15 feet with RW and CGA with extremely slow pace, short step length, stooped posture, and poor positioning of RW and body. Pateint appears pre-occupied by pain and reports his R lower leg is extremely painful with weight bearing. Reports chest pain continues. Patient reports wanting to walk further at first but then requested to return to bed due to severity of pain.  Stairs            Wheelchair Mobility    Modified Rankin (Stroke Patients Only)       Balance Overall balance assessment: Needs assistance Sitting-balance support: Feet supported Sitting balance-Leahy Scale: Good     Standing balance support: During functional activity;Bilateral upper extremity supported Standing balance-Leahy Scale: Fair Standing balance comment: able to let go of RW while leaning on stretcher to use hands for urination, but balance not good enought to also manage clothes.                             Pertinent Vitals/Pain Pain Score: 9  Faces Pain Scale: Hurts whole lot Pain Location: chest pain and right medial lower leg pain worse with movement (chest and leg) or weight bearing (leg) Pain Descriptors / Indicators: Grimacing;Guarding;Moaning Pain Intervention(s): Limited activity within patient's tolerance;Monitored during session;Repositioned    Home Living Family/patient expects to be discharged to:: Private residence Living Arrangements: Alone Available Help at Discharge: Family;Available 24 hours/day Type of Home: House Home Access: Stairs to enter   [employer] of Steps: 5 Home Layout: One level Home Equipment: Cane - single point      Prior Function Level of Independence: Independent with assistive device(s)         Comments: Pt lives in an apartment alone but  grandson present in the room reports he will be taking him home to stay with him at discharge. Grandson lives in 1 level home with 5 STE.     Hand Dominance   Dominant Hand: Right    Extremity/Trunk Assessment   Upper Extremity Assessment Upper Extremity Assessment: Generalized weakness    Lower Extremity Assessment Lower Extremity Assessment: Generalized weakness;RLE deficits/detail RLE: Unable to fully assess due to pain       Communication   Communication: Other (comment) (patient requests grandson [name] to interpret)  Cognition Arousal/Alertness: Awake/alert Behavior During Therapy: WFL for tasks assessed/performed Overall Cognitive Status: Within Functional Limits for tasks assessed                                        General Comments General comments (skin integrity, edema, etc.): Patient found to use tube to insert into urethra to urinate. Updated nursing with this information. Vitals remained WFL on 2L/min O2.    Exercises Other Exercises Other Exercises: Patient practiced standing balance while urinating. Required assistance with clothing and to stablize his balance. Noted to use tube to insert into urethra to urinate (states he does this sometimes). Notified nursing.   Assessment/Plan    PT Assessment Patient needs  continued PT services  PT Problem List Decreased strength;Decreased range of motion;Decreased knowledge of use of DME;Decreased activity tolerance;Decreased balance;Pain;Decreased knowledge of precautions;Decreased mobility;Decreased coordination;Cardiopulmonary status limiting activity       PT Treatment Interventions DME instruction;Balance training;Gait training;Stair training;Neuromuscular re-education;Functional mobility training;Patient/family education;Therapeutic activities;Therapeutic exercise    PT Goals (Current goals can be found in the Care Plan section)  Acute Rehab PT Goals Patient Stated Goal: to feel better and go  home PT Goal Formulation: With patient/family Time For Goal Achievement: 07/28/21 Potential to Achieve Goals: Good    Frequency Min 2X/week   Barriers to discharge Other (comment) Patient currently requires physical assist with mobility and mobility is restricted to ~ 15 feet    Co-evaluation               AM-PAC PT "6 Clicks" Mobility  Outcome Measure Help needed turning from your back to your side while in a flat bed without using bedrails?: A Lot Help needed moving from lying on your back to sitting on the side of a flat bed without using bedrails?: A Lot Help needed moving to and from a bed to a chair (including a wheelchair)?: A Little Help needed standing up from a chair using your arms (e.g., wheelchair or bedside chair)?: A Little Help needed to walk in hospital room?: A Lot Help needed climbing 3-5 steps with a railing? : Total 6 Click Score: 13    End of Session Equipment Utilized During Treatment: Gait belt;Oxygen Activity Tolerance: Patient limited by pain Patient left: in bed;with family/visitor present Nurse Communication: Mobility status PT Visit Diagnosis: Unsteadiness on feet (R26.81);Other abnormalities of gait and mobility (R26.89);Difficulty in walking, not elsewhere classified (R26.2);Pain;Muscle weakness (generalized) (M62.81) Pain - Right/Left: Right (chest and R lower leg) Pain - part of body: Leg (and chest)    Time: 1157-2620 PT Time Calculation (min) (ACUTE ONLY): 28 min   Charges:   PT Evaluation $PT Eval Moderate Complexity: 1 Mod          Shondell Poulson R. Ilsa Iha, PT, DPT 07/14/21, 6:14 PM

## 2021-07-14 NOTE — ED Notes (Signed)
Pt assisted up to commode to have bowel movement by ed staff.

## 2021-07-14 NOTE — Progress Notes (Signed)
Echocardiogram 07-14-21

## 2021-07-14 NOTE — ED Notes (Signed)
RN found multiple medications at bedside that had not been given and was charted by previous RN  "not given due to not available".

## 2021-07-14 NOTE — Progress Notes (Signed)
PROGRESS NOTE    Brian [name]  ZOX:[id number] DOB: 1941-05-25 DOA: 07/13/2021 PCP: Armando Gang, FNP    Brief Narrative:  80 year old male with a history of HIV/AIDS, pacemaker placement for symptomatic bradycardia, hyperlipidemia, BPH, admitted to the hospital with progressive substernal chest pain, radiating to his back.  Work-up in the emergency room was unrevealing with negative cardiac enzymes and no acute EKG changes.  CT of the chest was negative for dissection.  Since he had continued symptoms, he was admitted for further evaluation.   Assessment & Plan:   Active Problems:   Chest pain   Chest pain -Appears to be atypical, present for the last week, increased with motion, reproducible on palpation -Cardiac enzymes have been negative and did not have any acute changes on EKG -Echo shows EF of 50% with grade 1 diastolic dysfunction.  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 -CTA of the chest was negative for dissection -Etiology is unclear at this point -There was no reported biliary ductal dilatation or abnormalities with hepatobiliary system on CT imaging -Bilirubin mildly elevated, but transaminases normal -Pancreas also noted to be normal on CT, but will check amylase and lipase -Continue supportive measures  History of HIV/AIDS -Continue on HAART -He is followed in the infectious disease clinic  Hyperlipidemia -Continue Lipitor  BPH -Continue on Avodart, Flomax -Staff has reported that patient mentioned that he occasionally self catheterizes at home with what appeared to be a children straw -Patient educated regarding the risks of infection and improper catheterization -We will check urinalysis and bladder scan  Hypertension -Continue metoprolol  Chronic respiratory failure with hypoxia -Patient reports that he uses 2 L of oxygen chronically -Reports being told that he has asthma and uses albuterol as needed -No  other reported chronic lung disease  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 Lynnea Ferrier -It was not felt that ulcer was causing his symptoms or any acute issues, and aneurysm was too small for intervention Patient should have outpatient follow-up for surveillance  Distal sigmoid colon wall thickening -Will need elective colonoscopy for further evaluation   DVT prophylaxis: enoxaparin (LOVENOX) injection 40 mg Start: 07/13/21 2200 SCDs Start: 07/13/21 2027  Code Status: Full code Family Communication: No family present.  History obtained from patient with the help of Spanish interpreter, Rafael Disposition Plan: Status is: Inpatient  Remains inpatient appropriate because:Ongoing active pain requiring inpatient pain management, Ongoing diagnostic testing needed not appropriate for outpatient work up, and Inpatient level of care appropriate due to severity of illness  Dispo:  Patient From: Home  Planned Disposition: Home  Medically stable for discharge: No           Consultants:    Procedures:  Echocardiogram  Antimicrobials:      Subjective: Patient reports continued substernal chest pain that radiates through to his back.  Reports the pain is worse with deep inspiration and cough.  Denies any worsening shortness of breath.  Denies any nausea or vomiting or indigestion.  Does not have any abdominal pain.  Objective: Vitals:   07/14/21 1500 07/14/21 1515 07/14/21 1530 07/14/21 1545  BP: 120/74  (!) 151/104   Pulse: 85 88 82 91  Resp: <BADTEXHan Vejar 25  Temp:      TempSrc:      SpO2: 96% 96% 97% 98%  Weight:  Height:        Intake/Output Summary (Last 24 hours) at 07/14/2021 1927 Last data filed at 07/14/2021 1737 Gross per 24 hour  Intake --  Output 325 ml  Net -325 ml   Filed Weights   07/13/21 1243  Weight: 73  kg    Examination:  General exam: Appears calm and comfortable  Respiratory system: Clear to auscultation. Respiratory effort normal. Tenderness to palpation over sternum Cardiovascular system: S1 & S2 heard, RRR. No JVD, murmurs, rubs, gallops or clicks. No pedal edema. Gastrointestinal system: Abdomen is distended, soft and nontender. No organomegaly or masses felt. Normal bowel sounds heard. Central nervous system: Alert and oriented. No focal neurological deficits. Extremities: Symmetric 5 x 5 power. Skin: No rashes, lesions or ulcers Psychiatry: Judgement and insight appear normal. Mood & affect appropriate.     Data Reviewed: I have personally reviewed following labs and imaging studies  CBC: Recent Labs  Lab 07/13/21 1250 07/14/21 0758  WBC 12.7* 14.6*  HGB 15.3 16.5  HCT 42.5 45.5  MCV 90.8 89.6  PLT 206 197   Basic Metabolic Panel: Recent Labs  Lab 07/13/21 1250 07/14/21 0758  NA 139 135  K 3.4* 3.6  CL 104 96*  CO2 25 26  GLUCOSE 101* 127*  BUN 12 10  CREATININE 1.07 0.98  CALCIUM 9.0 9.2  MG  --  2.1   GFR: Estimated Creatinine Clearance: 54.3 mL/min (by C-G formula based on SCr of 0.98 mg/dL). Liver Function Tests: Recent Labs  Lab 07/14/21 0758  AST 27  ALT 18  ALKPHOS 49  BILITOT 1.8*  PROT 8.8*  ALBUMIN 4.2   No results for input(s): LIPASE, AMYLASE in the last 168 hours. No results for input(s): AMMONIA in the last 168 hours. Coagulation Profile: No results for input(s): INR, PROTIME in the last 168 hours. Cardiac Enzymes: No results for input(s): CKTOTAL, CKMB, CKMBINDEX, TROPONINI in the last 168 hours. BNP (last 3 results) No results for input(s): PROBNP in the last 8760 hours. HbA1C: No results for input(s): HGBA1C in the last 72 hours. CBG: No results for input(s): GLUCAP in the last 168 hours. Lipid Profile: Recent Labs    07/14/21 0758  CHOL 208*  HDL 54  LDLCALC 132*  TRIG 110  CHOLHDL 3.9   Thyroid Function  Tests: No results for input(s): TSH, T4TOTAL, FREET4, T3FREE, THYROIDAB in the last 72 hours. Anemia Panel: No results for input(s): VITAMINB12, FOLATE, FERRITIN, TIBC, IRON, RETICCTPCT in the last 72 hours. Sepsis Labs: No results for input(s): PROCALCITON, LATICACIDVEN in the last 168 hours.  Recent Results (from the past 240 hour(s))  Resp Panel by RT-PCR (Flu A&B, Covid) Nasopharyngeal Swab     Status: None   Collection Time: 07/13/21 10:55 PM   Specimen: Nasopharyngeal Swab; Nasopharyngeal(NP) swabs in vial transport medium  Result Value Ref Range Status   SARS Coronavirus 2 by RT PCR NEGATIVE NEGATIVE Final    Comment: (NOTE) SARS-CoV-2 target nucleic acids are NOT DETECTED.  The SARS-CoV-2 RNA is generally detectable in upper respiratory specimens during the acute phase of infection. The lowest concentration of SARS-CoV-2 viral copies this assay can detect is 138 copies/mL. A negative result does not preclude SARS-Cov-2 infection and should not be used as the sole basis for treatment or other patient management decisions. A negative result may occur with  improper specimen collection/handling, submission of specimen other than nasopharyngeal swab, presence of viral mutation(s) within the areas targeted by this assay, and inadequate number of  viral copies(<138 copies/mL). A negative result must be combined with clinical observations, patient history, and epidemiological information. The expected result is Negative.  Fact Sheet for Patients:  BloggerCourse.com  Fact Sheet for Healthcare Providers:  SeriousBroker.it  This test is no t yet approved or cleared by the [place] FDA and  has been authorized for detection and/or diagnosis of SARS-CoV-2 by FDA under an Emergency Use Authorization (EUA). This EUA will remain  in effect (meaning this test can be used) for the duration of the COVID-19 declaration under Section  564(b)(1) of the Act, 21 U.S.C.section 360bbb-3(b)(1), unless the authorization is terminated  or revoked sooner.       Influenza A by PCR NEGATIVE NEGATIVE Final   Influenza B by PCR NEGATIVE NEGATIVE Final    Comment: (NOTE) The Xpert Xpress SARS-CoV-2/FLU/RSV plus assay is intended as an aid in the diagnosis of influenza from Nasopharyngeal swab specimens and should not be used as a sole basis for treatment. Nasal washings and aspirates are unacceptable for Xpert Xpress SARS-CoV-2/FLU/RSV testing.  Fact Sheet for Patients: BloggerCourse.com  Fact Sheet for Healthcare Providers: SeriousBroker.it  This test is not yet approved or cleared by the [place] FDA and has been authorized for detection and/or diagnosis of SARS-CoV-2 by FDA under an Emergency Use Authorization (EUA). This EUA will remain in effect (meaning this test can be used) for the duration of the COVID-19 declaration under Section 564(b)(1) of the Act, 21 U.S.C. section 360bbb-3(b)(1), unless the authorization is terminated or revoked.  Performed at [facility], [address] [place], [place] [address]          Radiology Studies: DG Chest 2 View  Result Date: 07/13/2021 CLINICAL DATA:  Chest pain EXAM: CHEST - 2 VIEW COMPARISON:  03/08/2021 FINDINGS: Cardiomegaly with left chest multi lead pacer. Both lungs are clear. The visualized skeletal structures are unremarkable. IMPRESSION: Cardiomegaly without acute abnormality of the lungs. Electronically Signed   By: Lauralyn Primes M.D.   On: 07/13/2021 13:45   DG Ankle Complete Left  Result Date: 07/13/2021 CLINICAL DATA:  Foot and ankle pain EXAM: LEFT ANKLE COMPLETE - 3+ VIEW COMPARISON:  None. FINDINGS: There is no evidence of fracture, dislocation, or joint effusion. There is no evidence of arthropathy or other focal bone abnormality. Soft tissues are unremarkable. IMPRESSION: Negative.  Electronically Signed   By: Jasmine Pang M.D.   On: 07/13/2021 15:23   DG Foot Complete Left  Result Date: 07/13/2021 CLINICAL DATA:  Foot and ankle pain EXAM: LEFT FOOT - COMPLETE 3+ VIEW COMPARISON:  None. FINDINGS: No fracture or malalignment. Mild degenerative changes at the first MTP joint. Mild vascular calcifications. IMPRESSION: No acute osseous abnormality Electronically Signed   By: Jasmine Pang M.D.   On: 07/13/2021 15:23   ECHOCARDIOGRAM COMPLETE  Result Date: 07/14/2021    ECHOCARDIOGRAM REPORT   Patient Name:   Brian [name] Date of Exam: 07/14/2021 Medical Rec #:  [id number]    Height:       66.0 in Accession #:    [id number]   Weight:       160.9 lb Date of Birth:  [birth year]     BSA:          1.823 m Patient Age:    80 years     BP:           122/83 mmHg Patient Gender: M            HR:  89 bpm. Exam Location:  [facility] Procedure: 2D Echo, Cardiac Doppler and Color Doppler Indications:     Chest Pain R07.9  History:         Patient has prior history of Echocardiogram examinations.                  Huston Foley, HLD.  Sonographer:     Neysa Bonito Roar Referring Phys:  [id number] Oliver Pila HALL Diagnosing Phys: Julien Nordmann MD IMPRESSIONS  1. Left ventricular ejection fraction, by estimation, is 50 %. The left ventricle has low normal function. 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 . Left ventricular diastolic parameters are consistent with Grade I diastolic dysfunction (impaired relaxation). The average left ventricular global longitudinal strain is -16.6 %.  2. Right ventricular systolic function is normal. The right ventricular size is normal. Tricuspid regurgitation signal is inadequate for assessing PA pressure.  3. The mitral valve is normal in structure. No evidence of mitral valve regurgitation. No evidence of mitral stenosis. FINDINGS  Left Ventricle: Left ventricular ejection fraction, by estimation, is 50 to 55%. The left ventricle has low normal function. The left  ventricle has no regional wall motion abnormalities. The average left ventricular global longitudinal strain is -16.6 %. The left ventricular internal cavity size was normal in size. There is no left ventricular hypertrophy. Left ventricular diastolic parameters are consistent with Grade I diastolic dysfunction (impaired relaxation). Right Ventricle: The right ventricular size is normal. No increase in right ventricular wall thickness. Right ventricular systolic function is normal. Tricuspid regurgitation signal is inadequate for assessing PA pressure. Left Atrium: Left atrial size was normal in size. Right Atrium: Right atrial size was normal in size. Pericardium: There is no evidence of pericardial effusion. Mitral Valve: The mitral valve is normal in structure. No evidence of mitral valve regurgitation. No evidence of mitral valve stenosis. Tricuspid Valve: The tricuspid valve is normal in structure. Tricuspid valve regurgitation is mild . No evidence of tricuspid stenosis. Aortic Valve: The aortic valve was not well visualized. Aortic valve regurgitation is not visualized. No aortic stenosis is present. Aortic valve peak gradient measures 7.4 mmHg. Pulmonic Valve: The pulmonic valve was normal in structure. Pulmonic valve regurgitation is not visualized. No evidence of pulmonic stenosis. Aorta: The aortic root is normal in size and structure. Venous: The inferior vena cava is normal in size with greater than 50% respiratory variability, suggesting right atrial pressure of 3 mmHg. IAS/Shunts: No atrial level shunt detected by color flow Doppler.  LEFT VENTRICLE PLAX 2D LVIDd:         4.60 cm  Diastology LVIDs:         3.40 cm  LV e' medial:    3.37 cm/s LV PW:         1.00 cm  LV E/e' medial:  14.4 LV IVS:        1.00 cm  LV e' lateral:   5.11 cm/s LVOT diam:     1.90 cm  LV E/e' lateral: 9.5 LVOT Area:     2.84 cm                         2D Longitudinal Strain                         2D Strain GLS Avg:      -16.6 % RIGHT VENTRICLE RV Mid diam:    3.00 cm RV S prime:  7.62 cm/s TAPSE (M-mode): 1.4 cm LEFT ATRIUM             Index LA diam:        3.50 cm 1.92 cm/m LA Vol (A2C):   27.3 ml 14.97 ml/m LA Vol (A4C):   28.5 ml 15.63 ml/m LA Biplane Vol: 28.7 ml 15.74 ml/m  AORTIC VALVE                PULMONIC VALVE AV Area (Vmax): 1.90 cm    PV Vmax:          0.84 m/s AV Vmax:        136.00 cm/s PV Peak grad:     2.8 mmHg AV Peak Grad:   7.4 mmHg    PR End Diast Vel: 5.20 msec LVOT Vmax:      91.20 cm/s  RVOT Peak grad:   2 mmHg  AORTA Ao Root diam: 3.50 cm MITRAL VALVE MV Area (PHT): 3.77 cm    SHUNTS MV Decel Time: 201 msec    Systemic Diam: 1.90 cm MV E velocity: 48.40 cm/s MV A velocity: 89.50 cm/s MV E/A ratio:  0.54 MV A Prime:    12.8 cm/s Julien Nordmann MD Electronically signed by Julien Nordmann MD Signature Date/Time: 07/14/2021/2:48:31 PM    Final    CT Angio Chest/Abd/Pel for Dissection W and/or Wo Contrast  Result Date: 07/13/2021 CLINICAL DATA:  Abdominal pain, chest pain, suspected dissection EXAM: CT ANGIOGRAPHY CHEST, ABDOMEN AND PELVIS TECHNIQUE: Multidetector CT imaging through the chest, abdomen and pelvis was performed using the standard protocol during bolus administration of intravenous contrast. Multiplanar reconstructed images and MIPs were obtained and reviewed to evaluate the vascular anatomy. CONTRAST:  OMNIPAQUE IOHEXOL 350 MG/ML SOLN COMPARISON:  04/24/2021 and previous FINDINGS: CTA CHEST FINDINGS Cardiovascular: No hyperdense intramural hematoma on the precontrast images. Left subclavian transvenous pacing leads extend to the right atrium and coronary sinus. The RV is nondilated. Satisfactory opacification of pulmonary arteries noted, and there is no evidence of pulmonary emboli. There is good contrast opacification of the thoracic aorta. 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 This extends over length of approximately 2.5 cm, with the  depth of approximately 6 mm. No evidence of dissection or stenosis. Scattered calcified atheromatous plaque in ascending, arch, and descending segments. Bovine variant brachiocephalic arterial origin anatomy without proximal stenosis. Aortic Root: --Valve: 2.5 cm --Sinuses: 4.3 cm --Sinotubular Junction: 3.2 cm Limitations by motion: Moderate Thoracic Aorta: --Ascending Aorta: 4 cm --Aortic Arch: 4 cm --Descending Aorta: 3.4 cm Mediastinum/Nodes: No mediastinal hematoma.  No mass or adenopathy. Lungs/Pleura: No pleural effusion. No pneumothorax. Persistent irregular bandlike ground-glass opacities peripherally with some subpleural sparing. 1.1 cm nodular opacity in the inferior lingula, stable since previous but new since 04/02/2017. Musculoskeletal: Anterior vertebral endplate spurring at multiple levels in the lower thoracic spine. Review of the MIP images confirms the above findings. CTA ABDOMEN AND PELVIS FINDINGS VASCULAR Aorta: Calcified atheromatous plaque throughout. No aneurysm, dissection, or stenosis. Celiac: Patent without evidence of aneurysm, dissection, vasculitis or significant stenosis. SMA: Patent without evidence of aneurysm, dissection, vasculitis or significant stenosis. Renals: Single right, widely patent. Duplicated left, superior dominant, both patent. IMA: Patent without evidence of aneurysm, dissection, vasculitis or significant stenosis. Veins: No obvious venous abnormality within the limitations of this arterial phase study. Review of the MIP images confirms the above findings. NON-VASCULAR Hepatobiliary: No focal liver abnormality is seen. No gallstones, gallbladder wall thickening, or biliary dilatation. Pancreas: Unremarkable.  No pancreatic ductal dilatation or surrounding inflammatory changes. Spleen: Normal in size without focal abnormality. Adrenals/Urinary Tract: Kidneys unremarkable. Symmetric renal enhancement without focal lesion. No urolithiasis. The urinary bladder is  distended, thick-walled, with a left lateral diverticulum.Urethral stent. Stomach/Bowel: Stomach is incompletely distended. The small bowel is decompressed. Appendix not identified. The colon is nondilated with a few scattered diverticula. Abrupt caliber change in the distal sigmoid segment with some apparent eccentric circumferential wall thickening. No adjacent inflammatory change. Lymphatic: Subcentimeter left para-aortic lymph nodes. No mesenteric or pelvic adenopathy. Reproductive: Prostate enlargement with scattered coarse calcifications. Other: No ascites.  No free air. Musculoskeletal: Mild spondylitic changes in the lumbar spine. No fracture or worrisome bone lesion. Review of the MIP images confirms the above findings. IMPRESSION: 1. 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 2. 4 cm ascending thoracic aortic aneurysm. Recommend annual imaging followup by CTA or MRA. This recommendation follows 2010 ACCF/AHA/AATS/ACR/ASA/SCA/SCAI/SIR/STS/SVM Guidelines for the Diagnosis and Management of Patients with Thoracic Aortic Disease. Circulation. 2010; 121: A263-F354 3. Caliber change in the distal sigmoid colon with apparent segmental circumferential wall thickening, which may be mimicked by under distension. Consider colonoscopy or barium enema to exclude neoplasm. Electronically Signed   By: Corlis Leak M.D.   On: 07/13/2021 16:48        Scheduled Meds:  abacavir-dolutegravir-lamiVUDine  1 tablet Oral Daily   atorvastatin  40 mg Oral Daily   DULoxetine  30 mg Oral Daily   dutasteride  0.5 mg Oral Daily   enoxaparin (LOVENOX) injection  40 mg Subcutaneous Q24H   ketotifen  1 drop Both Eyes BID   metoprolol succinate  25 mg Oral Daily   pantoprazole  40 mg Oral Daily   senna-docusate  1 tablet Oral QHS   tamsulosin  0.4 mg Oral Daily   Continuous Infusions:  lactated ringers        LOS: 0 days    Time spent:    Erick Blinks, MD [facility]   If 7PM-7AM, please contact night-coverage [email or web address]  07/14/2021, 7:27 PM

## 2021-07-14 NOTE — ED Notes (Signed)
RN to bedside to introduce self to pt. Pt sleeping.  

## 2021-07-14 NOTE — ED Notes (Signed)
Interpreter at bedside.

## 2021-07-14 NOTE — ED Notes (Signed)
Report to crystal, rn.  

## 2021-07-14 NOTE — ED Notes (Signed)
Pt is flailing around in bed and crying in pain. H has pain all over and doesn't understand why it will not go away.

## 2021-07-14 NOTE — ED Notes (Signed)
Report from jenn, rn.  

## 2021-07-14 NOTE — Evaluation (Signed)
Occupational Therapy Evaluation Patient Details Name: Brian [name] MRN: [id number] DOB: 01-10-1941 Today's Date: 07/14/2021   History of Present Illness 80 y.o. male with medical history significant for HIV/AIDS, pacemaker for symptomatic bradycardia, hyperlipidemia, who presented to [facility] ED from home with complaints of chest pain, substernal, radiating to his thoracic back.  Associated with dyspnea on exertion.  Also reported left lower extremity pain with difficulty bearing weight for the past 3 days.  No falls.  He went to his primary care physician today with the same complaints.  He was advised to go to the ED for further evaluation.  Work-up in the ED was unrevealing, troponin negative x2.  CTA chest/AP negative for PE, negative for dissection.  His chest pain persisted in the ED despite IV morphine.   Clinical Impression   Patient presenting with decreased Ind in self care, balance, functional mobility/transfers, endurance, and safety awareness. Patient is spanish speaking and grandson in room requests to interpret during session. Pt lives at home alone with use of SPC at baseline. Grandson would like to take pt to his home at discharge and is able to provide assist for him. Pt needing min A of 2 hand held for ambulation to toilet for balance and equipment. Pt's HR increased to 120's with toileting. Pt on 2 L O2 at night but on RA O2 saturation decreased to 75% and placed on 2 Ls at end of session. Patient will benefit from acute OT to increase overall independence in the areas of ADLs, functional mobility, and safety awareness in order to safely discharge home with family.     Recommendations for follow up therapy are one component of a multi-disciplinary discharge planning process, led by the attending physician.  Recommendations may be updated based on patient status, additional functional criteria and insurance authorization.   Follow Up Recommendations  Home health OT;Supervision -  Intermittent    Equipment Recommendations  None recommended by OT       Precautions / Restrictions Precautions Precautions: Fall      Mobility Bed Mobility Overal bed mobility: Needs Assistance Bed Mobility: Supine to Sit;Sit to Supine     Supine to sit: Min assist Sit to supine: Mod assist   General bed mobility comments: assist for trunk support to EOB and mod A for B LEs to return to bed    Transfers Overall transfer level: Needs assistance Equipment used: 2 person hand held assist Transfers: Sit to/from [employer] Sit to Stand: Min assist Stand pivot transfers: Min assist            Balance Overall balance assessment: Needs assistance Sitting-balance support: Feet supported Sitting balance-Leahy Scale: Good     Standing balance support: During functional activity Standing balance-Leahy Scale: Fair                             ADL either performed or assessed with clinical judgement   ADL Overall ADL's : Needs assistance/impaired                         Toilet Transfer: Minimal assistance;+2 for safety/equipment   Toileting- Clothing Manipulation and Hygiene: Minimal assistance;Sit to/from stand       Functional mobility during ADLs: Minimal assistance;+2 for safety/equipment General ADL Comments: Pt required min HHA and second helper for equipment.     Vision Patient Visual Report: No change from baseline  Pertinent Vitals/Pain Pain Assessment: Faces Faces Pain Scale: Hurts a little bit Pain Location: chest pain Pain Descriptors / Indicators: Discomfort Pain Intervention(s): Limited activity within patient's tolerance;Premedicated before session;Repositioned;Monitored during session     Hand Dominance Right   Extremity/Trunk Assessment Upper Extremity Assessment Upper Extremity Assessment: Overall WFL for tasks assessed;Generalized weakness   Lower Extremity Assessment Lower Extremity  Assessment: Overall WFL for tasks assessed;Generalized weakness       Communication Communication Communication: Other (comment) (grandson requests to interpret)   Cognition Arousal/Alertness: Awake/alert Behavior During Therapy: WFL for tasks assessed/performed Overall Cognitive Status: Within Functional Limits for tasks assessed                                                Home Living Family/patient expects to be discharged to:: Private residence Living Arrangements: Alone Available Help at Discharge: Family;Available 24 hours/day Type of Home: House Home Access: Stairs to enter [employer] of Steps: 5   Home Layout: One level     Bathroom Shower/Tub: Tub/shower unit         Home Equipment: Cane - single point          Prior Functioning/Environment Level of Independence: Independent with assistive device(s)        Comments: Pt lives in an apartment alone but grandson present in the room reports he will be taking him home to stay with him at discharge. Grandson lives in 1 level home with 5 STE.        OT Problem List: Decreased strength;Decreased activity tolerance;Impaired balance (sitting and/or standing);Decreased safety awareness;Decreased knowledge of use of DME or AE;Pain;Cardiopulmonary status limiting activity      OT Treatment/Interventions: Self-care/ADL training;Therapeutic exercise;Energy conservation;DME and/or AE instruction;Therapeutic activities;Balance training;Patient/family education;Manual therapy;Modalities    OT Goals(Current goals can be found in the care plan section) Acute Rehab OT Goals Patient Stated Goal: to go home OT Goal Formulation: With patient Time For Goal Achievement: 07/28/21 Potential to Achieve Goals: Good ADL Goals Pt Will Perform Grooming: standing;with modified independence Pt Will Perform Lower Body Dressing: with modified independence;sit to/from stand Pt Will Transfer to Toilet: with  modified independence;ambulating Pt Will Perform Toileting - Clothing Manipulation and hygiene: with modified independence;sit to/from stand  OT Frequency: Min 2X/week   Barriers to D/C:    none known at this time          AM-PAC OT "6 Clicks" Daily Activity     Outcome Measure Help from another person eating meals?: None Help from another person taking care of personal grooming?: None Help from another person toileting, which includes using toliet, bedpan, or urinal?: A Little Help from another person bathing (including washing, rinsing, drying)?: A Little Help from another person to put on and taking off regular upper body clothing?: None Help from another person to put on and taking off regular lower body clothing?: A Little 6 Click Score: 21   End of Session Equipment Utilized During Treatment: Oxygen (2Ls) Nurse Communication: Mobility status;Precautions  Activity Tolerance: Patient tolerated treatment well Patient left: in bed;with call bell/phone within reach;with family/visitor present  OT Visit Diagnosis: Unsteadiness on feet (R26.81);Repeated falls (R29.6);Muscle weakness (generalized) (M62.81)                Time: 8242-3536 OT Time Calculation (min): 19 min Charges:  OT General Charges $OT Visit: 1 Visit OT Evaluation $  OT Eval Moderate Complexity: 1 Mod OT Treatments $Self Care/Home Management : 8-22 mins  Jackquline Denmark, MS, OTR/L , CBIS ascom [phone]  07/14/21, 11:47 AM

## 2021-07-14 NOTE — ED Notes (Signed)
Secure chat message sent to dr. Para March regarding hypertension, awaiting response.

## 2021-07-15 ENCOUNTER — Inpatient Hospital Stay: Payer: Medicare Other

## 2021-07-15 DIAGNOSIS — M79604 Pain in right leg: Secondary | ICD-10-CM

## 2021-07-15 DIAGNOSIS — M79606 Pain in leg, unspecified: Secondary | ICD-10-CM

## 2021-07-15 DIAGNOSIS — M25552 Pain in left hip: Secondary | ICD-10-CM

## 2021-07-15 DIAGNOSIS — I10 Essential (primary) hypertension: Secondary | ICD-10-CM

## 2021-07-15 DIAGNOSIS — N401 Enlarged prostate with lower urinary tract symptoms: Secondary | ICD-10-CM | POA: Diagnosis not present

## 2021-07-15 DIAGNOSIS — B20 Human immunodeficiency virus [HIV] disease: Secondary | ICD-10-CM | POA: Diagnosis not present

## 2021-07-15 LAB — URINALYSIS, COMPLETE (UACMP) WITH MICROSCOPIC
Bilirubin Urine: NEGATIVE
Glucose, UA: NEGATIVE mg/dL
Nitrite: POSITIVE — AB
Protein, ur: 100 mg/dL — AB
Specific Gravity, Urine: 1.025 (ref 1.005–1.030)
WBC, UA: >50 WBC/hpf (ref 0–5)
pH: 5.5 (ref 5.0–8.0)

## 2021-07-15 LAB — COMPREHENSIVE METABOLIC PANEL
ALT: 17 U/L (ref 0–44)
AST: 32 U/L (ref 15–41)
Albumin: 3.3 g/dL — ABNORMAL LOW (ref 3.5–5.0)
Alkaline Phosphatase: 47 U/L (ref 38–126)
Anion gap: 8 (ref 5–15)
BUN: 15 mg/dL (ref 8–23)
CO2: 28 mmol/L (ref 22–32)
Calcium: 8.5 mg/dL — ABNORMAL LOW (ref 8.9–10.3)
Chloride: 96 mmol/L — ABNORMAL LOW (ref 98–111)
Creatinine, Ser: 1.01 mg/dL (ref 0.61–1.24)
GFR, Estimated: >60 mL/min (ref >60)
Glucose, Bld: 111 mg/dL — ABNORMAL HIGH (ref 70–99)
Potassium: 4 mmol/L (ref 3.5–5.1)
Sodium: 132 mmol/L — ABNORMAL LOW (ref 135–145)
Total Bilirubin: 1.5 mg/dL — ABNORMAL HIGH (ref 0.3–1.2)
Total Protein: 7.1 g/dL (ref 6.5–8.1)

## 2021-07-15 LAB — CBC
HCT: 40.3 % (ref 39.0–52.0)
Hemoglobin: 13.9 g/dL (ref 13.0–17.0)
MCH: 31.2 pg (ref 26.0–34.0)
MCHC: 34.5 g/dL (ref 30.0–36.0)
MCV: 90.4 fL (ref 80.0–100.0)
Platelets: 195 10*3/uL (ref 150–400)
RBC: 4.46 MIL/uL (ref 4.22–5.81)
RDW: 14.1 % (ref 11.5–15.5)
WBC: 11.3 10*3/uL — ABNORMAL HIGH (ref 4.0–10.5)
nRBC: 0 % (ref 0.0–0.2)

## 2021-07-15 LAB — D-DIMER, QUANTITATIVE: D-Dimer, Quant: 2.46 ug/mL-FEU — ABNORMAL HIGH (ref 0.00–0.50)

## 2021-07-15 NOTE — Progress Notes (Signed)
PROGRESS NOTE    Brian [name]  IHK:[id number] DOB: [birth year] DOA: 07/13/2021 PCP: Armando Gang, FNP    Brief Narrative:  80 year old male with a history of HIV/AIDS, pacemaker placement for symptomatic bradycardia, hyperlipidemia, BPH, admitted to the hospital with progressive substernal chest pain, radiating to his back.  Work-up in the emergency room was unrevealing with negative cardiac enzymes and no acute EKG changes.  CT of the chest was negative for dissection.  Since he had continued symptoms, he was admitted for further evaluation.   Assessment & Plan:   Active Problems:   Bronchial asthma   HIV (human immunodeficiency virus infection) (HCC)   BPH (benign prostatic hyperplasia)   Essential hypertension   Chest pain   Chest pain -Appears to be atypical, present for the last week, increased with motion, reproducible on palpation -Cardiac enzymes have been negative and did not have any acute changes on EKG -Echo shows EF of 50% with grade 1 diastolic dysfunction.  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 -CTA of the chest was negative for dissection -Etiology is unclear at this point -There was no reported biliary ductal dilatation or abnormalities with hepatobiliary system on CT imaging -Bilirubin mildly elevated, but transaminases normal -Pancreas also noted to be normal on CT, amylase and lipase unremarkable -Continue supportive measures  Back pain -Noted to have pain between his shoulder blades -No significant findings on CT chest -We will check CT of thoracic spine for any compression fractures  Right lower extremity pain -Check D-dimer and venous Dopplers to rule out DVT  History of HIV/AIDS -Continue on HAART -He is followed in the infectious disease clinic  Hyperlipidemia -Continue Lipitor  BPH -Continue on Avodart, Flomax -Staff has reported that patient mentioned that he occasionally self catheterizes at  home with what appeared to be a children straw -Patient educated regarding the risks of infection and improper catheterization -Urinalysis indicating possible infection, urine culture in process  Hypertension -Continue metoprolol  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 Lynnea Ferrier -It was not felt that ulcer was causing his symptoms or any acute issues, and aneurysm was too small for intervention Patient should have outpatient follow-up for surveillance  Distal sigmoid colon wall thickening -Will need elective colonoscopy for further evaluation  Generalized weakness -Seen by PT/OT with skilled nursing facility placement   DVT prophylaxis: enoxaparin (LOVENOX) injection 40 mg Start: 07/13/21 2200 SCDs Start: 07/13/21 2027  Code Status: Full code Family Communication: No family present.  History obtained from patient with the help of Spanish interpreter, Rafael Disposition Plan: Status is: Inpatient  Remains inpatient appropriate because:Ongoing active pain requiring inpatient pain management, Ongoing diagnostic testing needed not appropriate for outpatient work up, and Inpatient level of care appropriate due to severity of illness  Dispo:  Patient From: Home  Planned Disposition: Home  Medically stable for discharge: No           Consultants:    Procedures:  Echocardiogram  Antimicrobials:      Subjective: Reports pain and right lower leg/calf.  Unable to bear weight on his foot.  Also describes pain in his back between his shoulder blades that does at times radiate to his chest.  Does not have any shortness of breath.  His pain in his  back/chest are worse with movement.  Objective: Vitals:   07/15/21 0948 07/15/21 1000 07/15/21 1325 07/15/21 1741  BP: (!) 139/59 (!) 163/86 (!) 150/78 (!) 169/92  Pulse: 66 63 73 71  Resp:  (!) Temp:   98 F (36.7 C) 97.6 F (36.4 C)  TempSrc:   Oral   SpO2:  96% 98% 100%  Weight:      Height:        Intake/Output Summary (Last 24 hours) at 07/15/2021 2041 Last data filed at 07/15/2021 1500 Gross per 24 hour  Intake 1336.67 ml  Output --  Net 1336.67 ml   Filed Weights   07/13/21 1243  Weight: 73 kg    Examination:  General exam: Appears calm and comfortable  Respiratory system: Clear to auscultation. Respiratory effort normal. Tenderness to palpation over sternum Cardiovascular system: S1 & S2 heard, RRR. No JVD, murmurs, rubs, gallops or clicks. No pedal edema. Gastrointestinal system: Abdomen is distended, soft and nontender. No organomegaly or masses felt. Normal bowel sounds heard. Central nervous system: Alert and oriented. No focal neurological deficits. Extremities: Patient has pain with rotation of his right ankle, also has pain to palpation over right calf Skin: No rashes, lesions or ulcers Psychiatry: Judgement and insight appear normal. Mood & affect appropriate.     Data Reviewed: I have personally reviewed following labs and imaging studies  CBC: Recent Labs  Lab 07/13/21 1250 07/14/21 0758 07/15/21 0710  WBC 12.7* 14.6* 11.3*  HGB 15.3 16.5 13.9  HCT 42.5 45.5 40.3  MCV 90.8 89.6 90.4  PLT 206 197 195   Basic Metabolic Panel: Recent Labs  Lab 07/13/21 1250 07/14/21 0758 07/15/21 0710  NA 139 135 132*  K 3.4* 3.6 4.0  CL 104 96* 96*  CO2 GLUCOSE 101* 127* 111*  BUN CREATININE 1.07 0.98 1.01  CALCIUM 9.0 9.2 8.5*  MG  --  2.1  --    GFR: Estimated Creatinine Clearance: 52.6 mL/min (by C-G formula based on SCr of 1.01 mg/dL). Liver Function Tests: Recent Labs  Lab 07/14/21 0758 07/15/21 0710  AST  27 32  ALT 18 17  ALKPHOS 49 47  BILITOT 1.8* 1.5*  PROT 8.8* 7.1  ALBUMIN 4.2 3.3*   Recent Labs  Lab 07/14/21 0758  LIPASE 26  AMYLASE 27*   No results for input(s): AMMONIA in the last 168 hours. Coagulation Profile: No results for input(s): INR, PROTIME in the last 168 hours. Cardiac Enzymes: No results for input(s): CKTOTAL, CKMB, CKMBINDEX, TROPONINI in the last 168 hours. BNP (last 3 results) No results for input(s): PROBNP in the last 8760 hours. HbA1C: No results for input(s): HGBA1C in the last 72 hours. CBG: No results for input(s): GLUCAP in the last 168 hours. Lipid Profile: Recent Labs    07/14/21 0758  CHOL 208*  HDL 54  LDLCALC 132*  TRIG 110  CHOLHDL 3.9   Thyroid Function Tests: No results for input(s): TSH, T4TOTAL, FREET4, T3FREE, THYROIDAB in the last 72 hours. Anemia Panel: No results for input(s): VITAMINB12, FOLATE, FERRITIN, TIBC, IRON, RETICCTPCT in the last 72 hours. Sepsis Labs: No results for input(s): PROCALCITON, LATICACIDVEN in the last 168 hours.  Recent Results (from the past 240 hour(s))  Resp Panel by RT-PCR (Flu A&B, Covid) Nasopharyngeal Swab     Status: None   Collection Time: 07/13/21 10:55 PM  Specimen: Nasopharyngeal Swab; Nasopharyngeal(NP) swabs in vial transport medium  Result Value Ref Range Status   SARS Coronavirus 2 by RT PCR NEGATIVE NEGATIVE Final    Comment: (NOTE) SARS-CoV-2 target nucleic acids are NOT DETECTED.  The SARS-CoV-2 RNA is generally detectable in upper respiratory specimens during the acute phase of infection. The lowest concentration of SARS-CoV-2 viral copies this assay can detect is 138 copies/mL. A negative result does not preclude SARS-Cov-2 infection and should not be used as the sole basis for treatment or other patient management decisions. A negative result may occur with  improper specimen collection/handling, submission of specimen other than nasopharyngeal swab, presence of viral  mutation(s) within the areas targeted by this assay, and inadequate number of viral copies(<138 copies/mL). A negative result must be combined with clinical observations, patient history, and epidemiological information. The expected result is Negative.  Fact Sheet for Patients:  BloggerCourse.com  Fact Sheet for Healthcare Providers:  SeriousBroker.it  This test is no t yet approved or cleared by the [place] FDA and  has been authorized for detection and/or diagnosis of SARS-CoV-2 by FDA under an Emergency Use Authorization (EUA). This EUA will remain  in effect (meaning this test can be used) for the duration of the COVID-19 declaration under Section 564(b)(1) of the Act, 21 U.S.C.section 360bbb-3(b)(1), unless the authorization is terminated  or revoked sooner.       Influenza A by PCR NEGATIVE NEGATIVE Final   Influenza B by PCR NEGATIVE NEGATIVE Final    Comment: (NOTE) The Xpert Xpress SARS-CoV-2/FLU/RSV plus assay is intended as an aid in the diagnosis of influenza from Nasopharyngeal swab specimens and should not be used as a sole basis for treatment. Nasal washings and aspirates are unacceptable for Xpert Xpress SARS-CoV-2/FLU/RSV testing.  Fact Sheet for Patients: BloggerCourse.com  Fact Sheet for Healthcare Providers: SeriousBroker.it  This test is not yet approved or cleared by the [place] FDA and has been authorized for detection and/or diagnosis of SARS-CoV-2 by FDA under an Emergency Use Authorization (EUA). This EUA will remain in effect (meaning this test can be used) for the duration of the COVID-19 declaration under Section 564(b)(1) of the Act, 21 U.S.C. section 360bbb-3(b)(1), unless the authorization is terminated or revoked.  Performed at [facility], [address] [place], [place] [address]          Radiology  Studies: CT CERVICAL SPINE WO CONTRAST  Result Date: 07/15/2021 CLINICAL DATA:  Neck and back pain EXAM: CT CERVICAL SPINE WITHOUT CONTRAST CT THORACIC SPINE WITHOUT CONTRAST TECHNIQUE: Multidetector CT imaging of the cervical and thoracic spine was performed without contrast. Multiplanar CT image reconstructions were also generated. COMPARISON:  None. FINDINGS: CT CERVICAL SPINE FINDINGS Alignment: Normal. Skull base and vertebrae: No acute fracture. No primary bone lesion or focal pathologic process. Soft tissues and spinal canal: No prevertebral fluid or swelling. No visible canal hematoma. Disc levels: Uncovertebral osteophytes at the C4-5 and C5-6 levels. No high-grade spinal canal stenosis. Upper chest: Negative. Other: None. CT THORACIC SPINE FINDINGS Alignment: Normal Vertebrae: No acute fracture or focal pathologic process. Paraspinal and other soft tissues: Multifocal atelectasis and calcific aortic atherosclerosis. Enlarged caudate lobe of the liver comment is visualized. Disc levels: IMPRESSION: 1. No acute fracture or static subluxation of the cervical or thoracic spine Aortic Atherosclerosis (ICD10-I70.0). Electronically Signed   By: Deatra Robinson M.D.   On: 07/15/2021 20:08   CT THORACIC SPINE WO CONTRAST  Result Date: 07/15/2021 CLINICAL DATA:  Neck and  back pain EXAM: CT CERVICAL SPINE WITHOUT CONTRAST CT THORACIC SPINE WITHOUT CONTRAST TECHNIQUE: Multidetector CT imaging of the cervical and thoracic spine was performed without contrast. Multiplanar CT image reconstructions were also generated. COMPARISON:  None. FINDINGS: CT CERVICAL SPINE FINDINGS Alignment: Normal. Skull base and vertebrae: No acute fracture. No primary bone lesion or focal pathologic process. Soft tissues and spinal canal: No prevertebral fluid or swelling. No visible canal hematoma. Disc levels: Uncovertebral osteophytes at the C4-5 and C5-6 levels. No high-grade spinal canal stenosis. Upper chest: Negative. Other: None.  CT THORACIC SPINE FINDINGS Alignment: Normal Vertebrae: No acute fracture or focal pathologic process. Paraspinal and other soft tissues: Multifocal atelectasis and calcific aortic atherosclerosis. Enlarged caudate lobe of the liver comment is visualized. Disc levels: IMPRESSION: 1. No acute fracture or static subluxation of the cervical or thoracic spine Aortic Atherosclerosis (ICD10-I70.0). Electronically Signed   By: Deatra Robinson M.D.   On: 07/15/2021 20:08   ECHOCARDIOGRAM COMPLETE  Result Date: 07/14/2021    ECHOCARDIOGRAM REPORT   Patient Name:   DENE NAZIR Date of Exam: 07/14/2021 Medical Rec #:  [id number]    Height:       66.0 in Accession #:    [id number]   Weight:       160.9 lb Date of Birth:  05-21-1941     BSA:          1.823 m Patient Age:    80 years     BP:           122/83 mmHg Patient Gender: M            HR:           89 bpm. Exam Location:  [facility] Procedure: 2D Echo, Cardiac Doppler and Color Doppler Indications:     Chest Pain R07.9  History:         Patient has prior history of Echocardiogram examinations.                  Huston Foley, HLD.  Sonographer:     Neysa Bonito Roar Referring Phys:  [id number] Oliver Pila HALL Diagnosing Phys: Julien Nordmann MD IMPRESSIONS  1. Left ventricular ejection fraction, by estimation, is 50 %. The left ventricle has low normal function. 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 . Left ventricular diastolic parameters are consistent with Grade I diastolic dysfunction (impaired relaxation). The average left ventricular global longitudinal strain is -16.6 %.  2. Right ventricular systolic function is normal. The right ventricular size is normal. Tricuspid regurgitation signal is inadequate for assessing PA pressure.  3. The mitral valve is normal in structure. No evidence of mitral valve regurgitation. No evidence of mitral stenosis. FINDINGS  Left Ventricle: Left ventricular ejection fraction, by estimation, is 50 to 55%. The left ventricle has low  normal function. The left ventricle has no regional wall motion abnormalities. The average left ventricular global longitudinal strain is -16.6 %. The left ventricular internal cavity size was normal in size. There is no left ventricular hypertrophy. Left ventricular diastolic parameters are consistent with Grade I diastolic dysfunction (impaired relaxation). Right Ventricle: The right ventricular size is normal. No increase in right ventricular wall thickness. Right ventricular systolic function is normal. Tricuspid regurgitation signal is inadequate for assessing PA pressure. Left Atrium: Left atrial size was normal in size. Right Atrium: Right atrial size was normal in size. Pericardium: There is no evidence of pericardial effusion. Mitral Valve: The mitral valve is normal in structure. No evidence  of mitral valve regurgitation. No evidence of mitral valve stenosis. Tricuspid Valve: The tricuspid valve is normal in structure. Tricuspid valve regurgitation is mild . No evidence of tricuspid stenosis. Aortic Valve: The aortic valve was not well visualized. Aortic valve regurgitation is not visualized. No aortic stenosis is present. Aortic valve peak gradient measures 7.4 mmHg. Pulmonic Valve: The pulmonic valve was normal in structure. Pulmonic valve regurgitation is not visualized. No evidence of pulmonic stenosis. Aorta: The aortic root is normal in size and structure. Venous: The inferior vena cava is normal in size with greater than 50% respiratory variability, suggesting right atrial pressure of 3 mmHg. IAS/Shunts: No atrial level shunt detected by color flow Doppler.  LEFT VENTRICLE PLAX 2D LVIDd:         4.60 cm  Diastology LVIDs:         3.40 cm  LV e' medial:    3.37 cm/s LV PW:         1.00 cm  LV E/e' medial:  14.4 LV IVS:        1.00 cm  LV e' lateral:   5.11 cm/s LVOT diam:     1.90 cm  LV E/e' lateral: 9.5 LVOT Area:     2.84 cm                         2D Longitudinal Strain                          2D Strain GLS Avg:     -16.6 % RIGHT VENTRICLE RV Mid diam:    3.00 cm RV S prime:     7.62 cm/s TAPSE (M-mode): 1.4 cm LEFT ATRIUM             Index LA diam:        3.50 cm 1.92 cm/m LA Vol (A2C):   27.3 ml 14.97 ml/m LA Vol (A4C):   28.5 ml 15.63 ml/m LA Biplane Vol: 28.7 ml 15.74 ml/m  AORTIC VALVE                PULMONIC VALVE AV Area (Vmax): 1.90 cm    PV Vmax:          0.84 m/s AV Vmax:        136.00 cm/s PV Peak grad:     2.8 mmHg AV Peak Grad:   7.4 mmHg    PR End Diast Vel: 5.20 msec LVOT Vmax:      91.20 cm/s  RVOT Peak grad:   2 mmHg  AORTA Ao Root diam: 3.50 cm MITRAL VALVE MV Area (PHT): 3.77 cm    SHUNTS MV Decel Time: 201 msec    Systemic Diam: 1.90 cm MV E velocity: 48.40 cm/s MV A velocity: 89.50 cm/s MV E/A ratio:  0.54 MV A Prime:    12.8 cm/s Julien Nordmann MD Electronically signed by Julien Nordmann MD Signature Date/Time: 07/14/2021/2:48:31 PM    Final         Scheduled Meds:  abacavir-dolutegravir-lamiVUDine  1 tablet Oral Daily   atorvastatin  40 mg Oral Daily   DULoxetine  30 mg Oral Daily   dutasteride  0.5 mg Oral Daily   enoxaparin (LOVENOX) injection  40 mg Subcutaneous Q24H   ketotifen  1 drop Both Eyes BID   metoprolol succinate  25 mg Oral Daily   pantoprazole  40 mg Oral Daily   senna-docusate  1 tablet  Oral QHS   tamsulosin  0.4 mg Oral Daily   Continuous Infusions:  lactated ringers 75 mL/hr at 07/15/21 1652     LOS: 1 day    Time spent:    Erick Blinks, MD [facility]   If 7PM-7AM, please contact night-coverage [email or web address]  07/15/2021, 8:41 PM

## 2021-07-16 DIAGNOSIS — J9611 Chronic respiratory failure with hypoxia: Secondary | ICD-10-CM | POA: Diagnosis not present

## 2021-07-16 DIAGNOSIS — N401 Enlarged prostate with lower urinary tract symptoms: Secondary | ICD-10-CM | POA: Diagnosis not present

## 2021-07-16 DIAGNOSIS — R079 Chest pain, unspecified: Secondary | ICD-10-CM | POA: Diagnosis not present

## 2021-07-16 DIAGNOSIS — B20 Human immunodeficiency virus [HIV] disease: Secondary | ICD-10-CM | POA: Diagnosis not present

## 2021-07-16 LAB — COMPREHENSIVE METABOLIC PANEL
ALT: 19 U/L (ref 0–44)
AST: 32 U/L (ref 15–41)
Albumin: 3 g/dL — ABNORMAL LOW (ref 3.5–5.0)
Alkaline Phosphatase: 52 U/L (ref 38–126)
Anion gap: 7 (ref 5–15)
BUN: 12 mg/dL (ref 8–23)
CO2: 28 mmol/L (ref 22–32)
Calcium: 8.3 mg/dL — ABNORMAL LOW (ref 8.9–10.3)
Chloride: 99 mmol/L (ref 98–111)
Creatinine, Ser: 0.84 mg/dL (ref 0.61–1.24)
GFR, Estimated: >60 mL/min (ref >60)
Glucose, Bld: 114 mg/dL — ABNORMAL HIGH (ref 70–99)
Potassium: 3.8 mmol/L (ref 3.5–5.1)
Sodium: 134 mmol/L — ABNORMAL LOW (ref 135–145)
Total Bilirubin: 1.4 mg/dL — ABNORMAL HIGH (ref 0.3–1.2)
Total Protein: 6.7 g/dL (ref 6.5–8.1)

## 2021-07-16 LAB — URINE CULTURE

## 2021-07-16 LAB — CBC
HCT: 37.7 % — ABNORMAL LOW (ref 39.0–52.0)
Hemoglobin: 13.2 g/dL (ref 13.0–17.0)
MCH: 31.4 pg (ref 26.0–34.0)
MCHC: 35 g/dL (ref 30.0–36.0)
MCV: 89.8 fL (ref 80.0–100.0)
Platelets: 178 10*3/uL (ref 150–400)
RBC: 4.2 MIL/uL — ABNORMAL LOW (ref 4.22–5.81)
RDW: 13.7 % (ref 11.5–15.5)
WBC: 8.1 10*3/uL (ref 4.0–10.5)
nRBC: 0 % (ref 0.0–0.2)

## 2021-07-16 MED ORDER — CEFAZOLIN SODIUM-DEXTROSE 1-4 GM/50ML-% IV SOLN
1.0000 g | Freq: Three times a day (TID) | INTRAVENOUS | Status: DC
  Administered 2021-07-16 – 2021-07-18 (×5): 1 g via INTRAVENOUS
  Filled 2021-07-16 (×6): qty 50

## 2021-07-16 MED ORDER — KETOROLAC TROMETHAMINE 15 MG/ML IJ SOLN
15.0000 mg | Freq: Four times a day (QID) | INTRAMUSCULAR | Status: DC | PRN
  Administered 2021-07-16 – 2021-07-17 (×3): 15 mg via INTRAVENOUS
  Filled 2021-07-16 (×4): qty 1

## 2021-07-16 NOTE — Progress Notes (Signed)
Occupational Therapy Treatment Patient Details Name: Brian [name] MRN: [id number] DOB: 05/27/1941 Today's Date: 07/16/2021   History of present illness 80 y.o. male with medical history significant for HIV/AIDS, pacemaker for symptomatic bradycardia, hyperlipidemia, who presented to [facility] ED from home with complaints of chest pain, substernal, radiating to his thoracic back.  Associated with dyspnea on exertion.  Also reported left lower extremity pain with difficulty bearing weight for the past 3 days.  No falls.  He went to his primary care physician today with the same complaints.  He was advised to go to the ED for further evaluation.  Work-up in the ED was unrevealing, troponin negative x2.  CTA chest/AP negative for PE, negative for dissection.  His chest pain persisted in the ED despite IV morphine.   OT comments  Chart reviewed, pt is greeted in bed in room with daughter present. Interpreter Paola #[id number] utilized via device. Pt reports 8/10 pain throughout L side however is in agreement for mobility into chair. Tx session targeted progressing functional mobility for improved independence in ADL completion. Pt performs supine>seated at EOB with MOD A, transfer to bedside chair with RW and gait bet with MIN A, step by step vcs. Pt performed grooming tasks in sitting with SET UP. Pt does not report increased pain during/following task, vss remain throughout. Increased time and rest breaks required throughout. Education provided re: purpose of tx, recommendations. Information is accepted. Pt is left in bedside chair, NAD, all needs met, +chair alarm, daughter present. Pt does appear to present with increased fatigue and decreased mobility as compared to evaluation therefore recommendation is changed to discharge to SNF to improve skills in functional deficits to facilitate safe discharge. OT will continue to follow while admitted.    Recommendations for follow up therapy are one component of a  multi-disciplinary discharge planning process, led by the attending physician.  Recommendations may be updated based on patient status, additional functional criteria and insurance authorization.    Follow Up Recommendations  SNF    Equipment Recommendations   (per next venue of care)    Recommendations for Other Services      Precautions / Restrictions Precautions Precautions: Fall Restrictions Weight Bearing Restrictions: No       Mobility Bed Mobility Overal bed mobility: Needs Assistance Bed Mobility: Supine to Sit     Supine to sit: Mod assist     General bed mobility comments: declined attempt    Transfers Overall transfer level: Needs assistance Equipment used: Rolling walker (2 wheeled) Transfers: Sit to/from Stand;Stand Pivot Transfers Sit to Stand: Min assist Stand pivot transfers: Min assist            Balance Overall balance assessment: Needs assistance Sitting-balance support: Feet unsupported Sitting balance-Leahy Scale: Good     Standing balance support: During functional activity;Bilateral upper extremity supported Standing balance-Leahy Scale: Fair                           General ADL Comments: SET UP for grooming tasks in seated, MIN A for STS, SPT to bedside chair with RW                     Cognition Arousal/Alertness: Awake/alert Behavior During Therapy: WFL for tasks assessed/performed Overall Cognitive Status: Within Functional Limits for tasks assessed                                                  General Comments Vss with 2L o2 via [place]    Pertinent Vitals/ Pain       Pain Assessment: 0-10 Pain Score: 8  Faces Pain Scale: Hurts whole lot Pain Location: L trunk pain, pt reports he has been mediciated, is agreeable to mobility Pain Descriptors / Indicators: Grimacing;Guarding;Moaning Pain Intervention(s): Limited activity within patient's tolerance;Monitored during session  Home Living                                           Prior Functioning/Environment              Frequency  Min 2X/week        Progress Toward Goals  OT Goals(current goals can now be found in the care plan section)  Progress towards OT goals: Progressing toward goals  Acute Rehab OT Goals Patient Stated Goal: to feel bettter OT Goal Formulation: With patient Potential to Achieve Goals: Good  Plan Discharge plan remains appropriate;Discharge plan needs to be updated    Co-evaluation                 AM-PAC OT "6 Clicks" Daily Activity     Outcome Measure   Help from another person eating meals?: None Help from another person taking care of personal grooming?: None Help from another person toileting, which includes using toliet, bedpan, or urinal?: A Little Help from another person bathing (including washing, rinsing, drying)?: A Little Help from another person to put on and taking off regular upper body clothing?: None Help from another person to put on and taking off regular lower body clothing?: A Little 6 Click Score: 21    End of Session Equipment Utilized During Treatment: Oxygen;Gait belt;Rolling walker  OT Visit Diagnosis: Unsteadiness on feet (R26.81);Repeated falls (R29.6);Muscle weakness (generalized) (M62.81)   Activity Tolerance No increased pain;Patient limited by fatigue   Patient Left in chair;with call bell/phone within reach;with chair alarm set;with family/visitor present   Nurse Communication Mobility status (NT)        Time: 1155-1228 OT Time Calculation (min): 33 min  Charges: OT General Charges $OT Visit: 1 Visit OT Treatments $Self Care/Home Management : 8-22 mins $Therapeutic Activity: 8-22 mins  Anna Unwin, OTD OTR/L  07/16/21, 1:05 PM  

## 2021-07-16 NOTE — Progress Notes (Signed)
PT Cancellation Note  Patient Details Name: Brian [name] MRN: [id number] DOB: 25-Jan-1941   Cancelled Treatment:    Reason Eval/Treat Not Completed: Pain limiting ability to participate  Interpreter Cindi Carbon from [facility]) in for session.  Pt reported continued pain in chest/sternum at times.  When asked to try to get up, he grabs chest and stated it hurts too much but is able to lean slightly forward and take personal button up pajama top off without grimacing.  He also complains of continued RLE pain.  [place] was - yesterday to r/o DVT.  He reports pain lower post leg at rest and with dorsiflexion.  There is some redness noted in area of c/o but no swelling or warmth.  He declines encouragement of OOB and supine exercises.  He stated he was unable to tolerate activity today.  Will continue at a later time/date.   Danielle Dess 07/16/2021, 12:27 PM

## 2021-07-16 NOTE — [place] FL2 (Signed)
[place] MEDICAID FL2 LEVEL OF CARE SCREENING TOOL     IDENTIFICATION  Patient Name: Brian [name] Birthdate: 05-29-1941 Sex: male Admission Date (Current Location): 07/13/2021  [facility] and [place] Number:  Chiropodist and Address:  [facility], [address], [place], [place] [address]      Provider Number: [id number]  Attending Physician Name and Address:  Erick Blinks, MD  Relative Name and Phone Number:  Ebbie Latus [phone]    Current Level of Care: Hospital Recommended Level of Care: Skilled Nursing Facility Prior Approval Number:    Date Approved/Denied:   PASRR Number: [id number]  Discharge Plan: SNF    Current Diagnoses: Patient Active Problem List   Diagnosis Date Noted   Leg pain    Chest pain 07/13/2021   Essential hypertension 08/31/2020   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   Peripheral neuropathy 05/03/2020   COPD (chronic obstructive pulmonary disease) (HCC) 11/27/2018   Syncope 07/30/2015   AKI (acute kidney injury) (HCC) 07/30/2015   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   Sleep apnea 07/30/2015   HIV (human immunodeficiency virus infection) (HCC) 07/30/2015   Depression 07/30/2015   BPH (benign prostatic hyperplasia) 07/30/2015    Orientation RESPIRATION BLADDER Height & Weight     Self, Time, Situation, Place  Normal Continent Weight: 73 kg Height:  5\' 6"  (167.6 cm)  BEHAVIORAL SYMPTOMS/MOOD NEUROLOGICAL BOWEL NUTRITION STATUS      Continent Diet (heart healthy)  AMBULATORY STATUS COMMUNICATION OF NEEDS Skin   Extensive Assist Verbally (Speaks SPanish) Normal                       Personal Care Assistance Level of Assistance  Bathing, Dressing Bathing Assistance: Limited assistance   Dressing Assistance: Limited assistance     Functional Limitations Info             SPECIAL CARE FACTORS FREQUENCY  PT (By licensed PT), OT (By licensed OT)     PT Frequency: 5 times per week OT Frequency: 5  times per week            Contractures Contractures Info: Not present    Additional Factors Info  Code Status, Allergies Code Status Info: Full code Allergies Info: NKDA           Current Medications (07/16/2021):  This is the current hospital active medication list Current Facility-Administered Medications  Medication Dose Route Frequency Provider Last Rate Last Admin   abacavir-dolutegravir-lamiVUDine (TRIUMEQ) 600-50-300 MG per tablet 1 tablet  1 tablet Oral Daily 07/18/2021 N, DO   1 tablet at 07/15/21 0948   acetaminophen (TYLENOL) tablet 650 mg  650 mg Oral Q6H PRN 07/17/21 N, DO       albuterol (VENTOLIN HFA) 108 (90 Base) MCG/ACT inhaler 2 puff  2 puff Inhalation QID PRN Dow Adolph N, DO   2 puff at 07/14/21 0040   atorvastatin (LIPITOR) tablet 40 mg  40 mg Oral Daily 07/16/21 N, DO   40 mg at 07/15/21 0948   clonazePAM (KLONOPIN) tablet 0.25 mg  0.25 mg Oral BID PRN 07/17/21 N, DO   0.25 mg at 07/14/21 0751   DULoxetine (CYMBALTA) DR capsule 30 mg  30 mg Oral Daily 07/16/21 N, DO   30 mg at 07/15/21 0949   dutasteride (AVODART) capsule 0.5 mg  0.5 mg Oral Daily 07/17/21 N, DO   0.5 mg at 07/15/21 0948   enoxaparin (LOVENOX) injection 40 mg  40  mg Subcutaneous Q24H Dow Adolph N, DO   40 mg at 07/15/21 2103   ketotifen (ZADITOR) 0.025 % ophthalmic solution 1 drop  1 drop Both Eyes BID Dow Adolph N, DO   1 drop at 07/15/21 2132   lactated ringers infusion   Intravenous Continuous Erick Blinks, MD 75 mL/hr at 07/16/21 0308 New Bag at 07/16/21 0308   metoprolol succinate (TOPROL-XL) 24 hr tablet 25 mg  25 mg Oral Daily Dow Adolph N, DO   25 mg at 07/15/21 0948   ondansetron (ZOFRAN) injection 4 mg  4 mg Intravenous Q6H PRN Dow Adolph N, DO   4 mg at 07/13/21 1942   pantoprazole (PROTONIX) EC tablet 40 mg  40 mg Oral Daily Dow Adolph N, DO   40 mg at 07/15/21 9937   senna-docusate (Senokot-S) tablet 1 tablet  1 tablet Oral QHS Dow Adolph N,  DO   1 tablet at 07/15/21 2102   tamsulosin (FLOMAX) capsule 0.4 mg  0.4 mg Oral Daily Dow Adolph N, DO   0.4 mg at 07/15/21 1696   traMADol-acetaminophen (ULTRACET) 37.5-325 MG per tablet 1 tablet  1 tablet Oral Daily PRN Darlin Drop, DO   1 tablet at 07/15/21 2130     Discharge Medications: Please see discharge summary for a list of discharge medications.  Relevant Imaging Results:  Relevant Lab Results:   Additional Information SS# [id number]  Barrie Dunker, RN

## 2021-07-16 NOTE — Progress Notes (Signed)
PROGRESS NOTE    Brian [name]  XBM:[id number] DOB: [birth year] DOA: 07/13/2021 PCP: Armando Gang, FNP    Brief Narrative:  80 year old male with a history of HIV/AIDS, pacemaker placement for symptomatic bradycardia, hyperlipidemia, BPH, admitted to the hospital with progressive substernal chest pain, radiating to his back.  Work-up in the emergency room was unrevealing with negative cardiac enzymes and no acute EKG changes.  CT of the chest was negative for dissection.  Since he had continued symptoms, he was admitted for further evaluation.   Assessment & Plan:   Active Problems:   Bronchial asthma   HIV (human immunodeficiency virus infection) (HCC)   BPH (benign prostatic hyperplasia)   Essential hypertension   Chest pain   Leg pain   Chest pain -Appears to be atypical, present for the last week, increased with motion, reproducible on palpation -Cardiac enzymes have been negative and did not have any acute changes on EKG -Echo shows EF of 50% with grade 1 diastolic dysfunction.  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 -CTA of the chest was negative for dissection -Etiology is unclear at this point -There was no reported biliary ductal dilatation or abnormalities with hepatobiliary system on CT imaging -Bilirubin mildly elevated, but transaminases normal -Pancreas also noted to be normal on CT, amylase and lipase unremarkable -He now says the pain is more on the right chest and right upper back, no rash present, will continue to monitor  Back pain -Noted to have pain between his shoulder blades -No significant findings on CT chest -CT C-spine and thoracic spine negative for compression fractures -Unable to perform MRI due to pacemaker  Right lower extremity pain with possible cellulitis -D-dimer elevated, but venous Dopplers negative for DVT -He is noted to have some erythema over his calf and may be developing a  cellulitis -Temperature noted to be elevated on exam today -Started on antibiotics for cellulitis order set -Check blood cultures  History of HIV/AIDS -Continue on HAART -He is followed in the infectious disease clinic  Hyperlipidemia -Continue Lipitor  BPH -Continue on Avodart, Flomax -Staff has reported that patient mentioned that he occasionally self catheterizes at home with what appeared to be a children straw -Patient educated regarding the risks of infection and improper catheterization -Urinalysis indicating possible infection, urine culture showing multiple species  Hypertension -Continue metoprolol  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 Lynnea Ferrier -It was not felt that ulcer was causing his symptoms or any acute issues, and aneurysm was too small for intervention Patient should have outpatient follow-up for surveillance  Distal sigmoid colon wall thickening -Will need elective colonoscopy for further evaluation  Generalized weakness -Seen by PT/OT with skilled nursing facility placement   DVT prophylaxis: enoxaparin (LOVENOX) injection 40 mg Start: 07/13/21 2200 SCDs Start: 07/13/21 2027  Code Status: Full code Family Communication: Discussed with his daughter at the bedside Disposition Plan: Status is: Inpatient  Remains inpatient appropriate because:Ongoing active pain requiring inpatient pain management, Ongoing diagnostic testing needed not appropriate for outpatient work up, and Inpatient level of care appropriate due to severity of illness  Dispo:  Patient From: Home  Planned Disposition: SNF  Medically stable for discharge:  No  Consultants:    Procedures:  Echocardiogram  Antimicrobials:      Subjective: Reports continued pain in his right lower leg as well as right chest/right upper back.  Finds it difficult to even move in bed.  Objective: Vitals:   07/16/21 0756 07/16/21 1251 07/16/21 1610 07/16/21 2037  BP: (!) 177/89  (!) 161/78 (!) 162/82  Pulse: 72  89 65  Resp: 20  19 18   Temp: 98.3 F (36.8 C)  99 F (37.2 C) (!) 97.5 F (36.4 C)  TempSrc:   Oral   SpO2: 100% 98% 96% 97%  Weight:      Height:        Intake/Output Summary (Last 24 hours) at 07/16/2021 2045 Last data filed at 07/16/2021 2038 Gross per 24 hour  Intake 2330.26 ml  Output 500 ml  Net 1830.26 ml   Filed Weights   07/13/21 1243  Weight: 73 kg    Examination:  General exam: Appears calm and comfortable  Respiratory system: Clear to auscultation. Respiratory effort normal. Tenderness to palpation over sternum Cardiovascular system: S1 & S2 heard, RRR. No JVD, murmurs, rubs, gallops or clicks. No pedal edema. Gastrointestinal system: Abdomen is distended, soft and nontender. No organomegaly or masses felt. Normal bowel sounds heard. Central nervous system: Alert and oriented. No focal neurological deficits. Extremities: Pain with rotation of his right foot he also has pain with rotation of his right shoulder and over his right chest/upper back Skin: Area of erythema noted over right calf Psychiatry: Judgement and insight appear normal. Mood & affect appropriate.     Data Reviewed: I have personally reviewed following labs and imaging studies  CBC: Recent Labs  Lab 07/13/21 1250 07/14/21 0758 07/15/21 0710 07/16/21 0414  WBC 12.7* 14.6* 11.3* 8.1  HGB 15.3 16.5 13.9 13.2  HCT 42.5 45.5 40.3 37.7*  MCV 90.8 89.6 90.4 89.8  PLT 206 197 195 178   Basic Metabolic Panel: Recent Labs  Lab 07/13/21 1250 07/14/21 0758 07/15/21 0710 07/16/21 0414  NA 139 135 132* 134*  K 3.4* 3.6 4.0 3.8   CL 104 96* 96* 99  CO2 25 26 28 28   GLUCOSE 101* 127* 111* 114*  BUN 12 10 15 12   CREATININE 1.07 0.98 1.01 0.84  CALCIUM 9.0 9.2 8.5* 8.3*  MG  --  2.1  --   --    GFR: Estimated Creatinine Clearance: 63.3 mL/min (by C-G formula based on SCr of 0.84 mg/dL). Liver Function Tests: Recent Labs  Lab 07/14/21 0758 07/15/21 0710 07/16/21 0414  AST 27 32 32  ALT 18 17 19   ALKPHOS 49 47 52  BILITOT 1.8* 1.5* 1.4*  PROT 8.8* 7.1 6.7  ALBUMIN 4.2 3.3* 3.0*   Recent Labs  Lab 07/14/21 0758  LIPASE 26  AMYLASE 27*   No results for input(s): AMMONIA in the last 168 hours. Coagulation Profile: No results for input(s): INR, PROTIME in the last 168 hours. Cardiac Enzymes: No results for input(s): CKTOTAL, CKMB, CKMBINDEX, TROPONINI in the last 168 hours. BNP (last 3 results) No results for input(s): PROBNP in the last 8760 hours. HbA1C: No results for input(s): HGBA1C in the last 72 hours. CBG: No results for input(s): GLUCAP in the last 168 hours. Lipid Profile: Recent Labs    07/14/21 0758  CHOL 208*  HDL 54  LDLCALC 132*  TRIG 110  CHOLHDL 3.9   Thyroid Function Tests: No results for input(s): TSH, T4TOTAL, FREET4, T3FREE, THYROIDAB in the last 72 hours. Anemia  Panel: No results for input(s): VITAMINB12, FOLATE, FERRITIN, TIBC, IRON, RETICCTPCT in the last 72 hours. Sepsis Labs: No results for input(s): PROCALCITON, LATICACIDVEN in the last 168 hours.  Recent Results (from the past 240 hour(s))  Resp Panel by RT-PCR (Flu A&B, Covid) Nasopharyngeal Swab     Status: None   Collection Time: 07/13/21 10:55 PM   Specimen: Nasopharyngeal Swab; Nasopharyngeal(NP) swabs in vial transport medium  Result Value Ref Range Status   SARS Coronavirus 2 by RT PCR NEGATIVE NEGATIVE Final    Comment: (NOTE) SARS-CoV-2 target nucleic acids are NOT DETECTED.  The SARS-CoV-2 RNA is generally detectable in upper respiratory specimens during the acute phase of infection. The  lowest concentration of SARS-CoV-2 viral copies this assay can detect is 138 copies/mL. A negative result does not preclude SARS-Cov-2 infection and should not be used as the sole basis for treatment or other patient management decisions. A negative result may occur with  improper specimen collection/handling, submission of specimen other than nasopharyngeal swab, presence of viral mutation(s) within the areas targeted by this assay, and inadequate number of viral copies(<138 copies/mL). A negative result must be combined with clinical observations, patient history, and epidemiological information. The expected result is Negative.  Fact Sheet for Patients:  BloggerCourse.com  Fact Sheet for Healthcare Providers:  SeriousBroker.it  This test is no t yet approved or cleared by the [place] FDA and  has been authorized for detection and/or diagnosis of SARS-CoV-2 by FDA under an Emergency Use Authorization (EUA). This EUA will remain  in effect (meaning this test can be used) for the duration of the COVID-19 declaration under Section 564(b)(1) of the Act, 21 U.S.C.section 360bbb-3(b)(1), unless the authorization is terminated  or revoked sooner.       Influenza A by PCR NEGATIVE NEGATIVE Final   Influenza B by PCR NEGATIVE NEGATIVE Final    Comment: (NOTE) The Xpert Xpress SARS-CoV-2/FLU/RSV plus assay is intended as an aid in the diagnosis of influenza from Nasopharyngeal swab specimens and should not be used as a sole basis for treatment. Nasal washings and aspirates are unacceptable for Xpert Xpress SARS-CoV-2/FLU/RSV testing.  Fact Sheet for Patients: BloggerCourse.com  Fact Sheet for Healthcare Providers: SeriousBroker.it  This test is not yet approved or cleared by the [place] FDA and has been authorized for detection and/or diagnosis of SARS-CoV-2 by FDA under  an Emergency Use Authorization (EUA). This EUA will remain in effect (meaning this test can be used) for the duration of the COVID-19 declaration under Section 564(b)(1) of the Act, 21 U.S.C. section 360bbb-3(b)(1), unless the authorization is terminated or revoked.  Performed at [facility], [address] [place], [place] [address]   Urine Culture     Status: Abnormal   Collection Time: 07/15/21  5:25 AM   Specimen: Urine, Random  Result Value Ref Range Status   Specimen Description   Final    URINE, RANDOM Performed at [facility], [address] [place], [place] [address]    Special Requests   Final    Immunocompromised Performed at [facility], [address] [place], [place] [address]    Culture MULTIPLE SPECIES PRESENT, SUGGEST RECOLLECTION (A)  Final   Report Status 07/16/2021 FINAL  Final         Radiology Studies: CT CERVICAL SPINE WO CONTRAST  Result Date: 07/15/2021 CLINICAL DATA:  Neck and back pain EXAM: CT CERVICAL SPINE WITHOUT CONTRAST CT THORACIC SPINE WITHOUT CONTRAST TECHNIQUE: Multidetector CT imaging of  the cervical and thoracic spine was performed without contrast. Multiplanar CT image reconstructions were also generated. COMPARISON:  None. FINDINGS: CT CERVICAL SPINE FINDINGS Alignment: Normal. Skull base and vertebrae: No acute fracture. No primary bone lesion or focal pathologic process. Soft tissues and spinal canal: No prevertebral fluid or swelling. No visible canal hematoma. Disc levels: Uncovertebral osteophytes at the C4-5 and C5-6 levels. No high-grade spinal canal stenosis. Upper chest: Negative. Other: None. CT THORACIC SPINE FINDINGS Alignment: Normal Vertebrae: No acute fracture or focal pathologic process. Paraspinal and other soft tissues: Multifocal atelectasis and calcific aortic atherosclerosis. Enlarged caudate lobe of the liver comment is visualized. Disc levels: IMPRESSION: 1. No acute fracture or static  subluxation of the cervical or thoracic spine Aortic Atherosclerosis (ICD10-I70.0). Electronically Signed   By: Deatra Robinson M.D.   On: 07/15/2021 20:08   CT THORACIC SPINE WO CONTRAST  Result Date: 07/15/2021 CLINICAL DATA:  Neck and back pain EXAM: CT CERVICAL SPINE WITHOUT CONTRAST CT THORACIC SPINE WITHOUT CONTRAST TECHNIQUE: Multidetector CT imaging of the cervical and thoracic spine was performed without contrast. Multiplanar CT image reconstructions were also generated. COMPARISON:  None. FINDINGS: CT CERVICAL SPINE FINDINGS Alignment: Normal. Skull base and vertebrae: No acute fracture. No primary bone lesion or focal pathologic process. Soft tissues and spinal canal: No prevertebral fluid or swelling. No visible canal hematoma. Disc levels: Uncovertebral osteophytes at the C4-5 and C5-6 levels. No high-grade spinal canal stenosis. Upper chest: Negative. Other: None. CT THORACIC SPINE FINDINGS Alignment: Normal Vertebrae: No acute fracture or focal pathologic process. Paraspinal and other soft tissues: Multifocal atelectasis and calcific aortic atherosclerosis. Enlarged caudate lobe of the liver comment is visualized. Disc levels: IMPRESSION: 1. No acute fracture or static subluxation of the cervical or thoracic spine Aortic Atherosclerosis (ICD10-I70.0). Electronically Signed   By: Deatra Robinson M.D.   On: 07/15/2021 20:08   US Venous Img Lower Unilateral Right (DVT)  Result Date: 07/16/2021 CLINICAL DATA:  RIGHT leg pain. EXAM: RIGHT LOWER EXTREMITY VENOUS DOPPLER ULTRASOUND TECHNIQUE: Gray-scale sonography with compression, as well as color and duplex ultrasound, were performed to evaluate the deep venous system(s) from the level of the common femoral vein through the popliteal and proximal calf veins. COMPARISON:  CTA CAP, 07/13/2021. FINDINGS: VENOUS Normal compressibility of the common femoral, superficial femoral, and popliteal veins, as well as the visualized calf veins. Visualized portions  of profunda femoral vein and great saphenous vein unremarkable. No filling defects to suggest DVT on grayscale or color Doppler imaging. Doppler waveforms show normal direction of venous flow, normal respiratory plasticity and response to augmentation. Limited views of the contralateral common femoral vein are unremarkable. OTHER No evidence of superficial thrombophlebitis or abnormal fluid collection. Limitations: none IMPRESSION: No evidence of femoropopliteal DVT within the RIGHT lower extremity. Roanna Banning, MD [facility] Radiology Electronically Signed   By: Roanna Banning M.D.   On: 07/16/2021 07:37        Scheduled Meds:  abacavir-dolutegravir-lamiVUDine  1 tablet Oral Daily   atorvastatin  40 mg Oral Daily   DULoxetine  30 mg Oral Daily   dutasteride  0.5 mg Oral Daily   enoxaparin (LOVENOX) injection  40 mg Subcutaneous Q24H   ketotifen  1 drop Both Eyes BID   metoprolol succinate  25 mg Oral Daily   pantoprazole  40 mg Oral Daily   senna-docusate  1 tablet Oral QHS   tamsulosin  0.4 mg Oral Daily   Continuous Infusions:   ceFAZolin (  ANCEF) IV 1 g (07/16/21 1730)   lactated ringers 75 mL/hr at 07/16/21 0308     LOS: 2 days    Time spent:    Erick Blinks, MD [facility]   If 7PM-7AM, please contact night-coverage [email or web address]  07/16/2021, 8:45 PM

## 2021-07-16 NOTE — TOC Progression Note (Signed)
Transition of Care [facility]) - Progression Note    Patient Details  Name: Brian [name] MRN: [id number] Date of Birth: 01/15/1941  [facility] CM/SW Contact  Barrie Dunker, RN Phone Number: 07/16/2021, 9:30 AM  Clinical Narrative:   The patient had planned to go home with the Grandson at DC, he lives at home alone, His mobility is not goo d enough o go right home, PT/OT recommends SNF Bedsearch was sent, will review the bed offers once obtained         Expected Discharge Plan and Services                                                 Social Determinants of Health (SDOH) Interventions    Readmission Risk Interventions No flowsheet data found.

## 2021-07-17 DIAGNOSIS — I10 Essential (primary) hypertension: Secondary | ICD-10-CM | POA: Diagnosis not present

## 2021-07-17 DIAGNOSIS — B20 Human immunodeficiency virus [HIV] disease: Secondary | ICD-10-CM | POA: Diagnosis not present

## 2021-07-17 DIAGNOSIS — M79604 Pain in right leg: Secondary | ICD-10-CM | POA: Diagnosis not present

## 2021-07-17 DIAGNOSIS — N401 Enlarged prostate with lower urinary tract symptoms: Secondary | ICD-10-CM | POA: Diagnosis not present

## 2021-07-17 LAB — BASIC METABOLIC PANEL
Anion gap: 9 (ref 5–15)
BUN: 12 mg/dL (ref 8–23)
CO2: 26 mmol/L (ref 22–32)
Calcium: 8.6 mg/dL — ABNORMAL LOW (ref 8.9–10.3)
Chloride: 99 mmol/L (ref 98–111)
Creatinine, Ser: 0.82 mg/dL (ref 0.61–1.24)
GFR, Estimated: >60 mL/min (ref >60)
Glucose, Bld: 112 mg/dL — ABNORMAL HIGH (ref 70–99)
Potassium: 3.4 mmol/L — ABNORMAL LOW (ref 3.5–5.1)
Sodium: 134 mmol/L — ABNORMAL LOW (ref 135–145)

## 2021-07-17 LAB — CBC
HCT: 37.3 % — ABNORMAL LOW (ref 39.0–52.0)
Hemoglobin: 13.5 g/dL (ref 13.0–17.0)
MCH: 32.4 pg (ref 26.0–34.0)
MCHC: 36.2 g/dL — ABNORMAL HIGH (ref 30.0–36.0)
MCV: 89.4 fL (ref 80.0–100.0)
Platelets: 206 10*3/uL (ref 150–400)
RBC: 4.17 MIL/uL — ABNORMAL LOW (ref 4.22–5.81)
RDW: 13.2 % (ref 11.5–15.5)
WBC: 7 10*3/uL (ref 4.0–10.5)
nRBC: 0 % (ref 0.0–0.2)

## 2021-07-17 MED ORDER — HYDRALAZINE HCL 20 MG/ML IJ SOLN
10.0000 mg | Freq: Four times a day (QID) | INTRAMUSCULAR | Status: DC | PRN
  Administered 2021-07-17: 10 mg via INTRAVENOUS
  Filled 2021-07-17: qty 1

## 2021-07-17 NOTE — Progress Notes (Signed)
VAST consult received to obtain IV access. Pt's nurse reported that patient has removed last 3 IV's, although none of them were charted in IV flowsheets. She stated she will be placing Mitts on patient but also asked that IV be wrapped with Kerlix in meantime.  PIV started and line wrapped with Kerlix.

## 2021-07-17 NOTE — Progress Notes (Signed)
PROGRESS NOTE    Brian [name]  WUX:[id number] DOB: 1940-11-13 DOA: 07/13/2021 PCP: Armando Gang, FNP    Brief Narrative:  80 year old male with a history of HIV/AIDS, pacemaker placement for symptomatic bradycardia, hyperlipidemia, BPH, admitted to the hospital with progressive substernal chest pain, radiating to his back.  Work-up in the emergency room was unrevealing with negative cardiac enzymes and no acute EKG changes.  CT of the chest was negative for dissection.  Since he had continued symptoms, he was admitted for further evaluation.   Assessment & Plan:   Active Problems:   Bronchial asthma   HIV (human immunodeficiency virus infection) (HCC)   BPH (benign prostatic hyperplasia)   Essential hypertension   Chest pain   Leg pain   Chest pain -Appears to be atypical, present for the last week, increased with motion, reproducible on palpation -Cardiac enzymes have been negative and did not have any acute changes on EKG -Echo shows EF of 50% with grade 1 diastolic dysfunction.  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 -CTA of the chest was negative for dissection -Etiology is unclear at this point -There was no reported biliary ductal dilatation or abnormalities with hepatobiliary system on CT imaging -Bilirubin mildly elevated, but transaminases normal -Pancreas also noted to be normal on CT, amylase and lipase unremarkable -He now says the pain is more on the right chest and right upper back, no rash present, will continue to monitor -Reports significant improvement of pain in chest, back and lower extremity with Toradol  Back pain -Noted to have pain between his shoulder blades -No significant findings on CT chest -CT C-spine and thoracic spine negative for compression fractures -Unable to perform MRI due to pacemaker  Right lower extremity pain with possible cellulitis -D-dimer elevated, but venous Dopplers negative for  DVT -He is noted to have some erythema over his calf and may be developing a cellulitis -Started on Ancef per cellulitis order set -Blood cultures in process, no growth thus far -Overall erythema improving  History of HIV/AIDS -Continue on HAART -He is followed in the infectious disease clinic  Hyperlipidemia -Continue Lipitor  BPH -Continue on Avodart, Flomax -Staff has reported that patient mentioned that he occasionally self catheterizes at home with what appeared to be a children straw -Patient educated regarding the risks of infection and improper catheterization -Urinalysis indicating possible infection, urine culture showing multiple species   Hypertension -Continue metoprolol  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 Lynnea Ferrier -It was not felt that ulcer was causing his symptoms or any acute issues, and aneurysm was too small for intervention Patient should have outpatient follow-up for surveillance  Distal sigmoid colon wall thickening -Will need elective colonoscopy for further evaluation  Generalized weakness -Seen by PT/OT with initial recommendations for SNF, although he may be able to go home with home health due to overall improvement   DVT prophylaxis: enoxaparin (LOVENOX) injection 40 mg Start: 07/13/21 2200 SCDs Start: 07/13/21 2027  Code Status: Full code Family Communication: Discussed with his grandson at the bedside Disposition Plan: Status is: Inpatient  Remains inpatient appropriate because:Ongoing active pain requiring inpatient pain management, Ongoing diagnostic testing needed not appropriate for outpatient work  up, and  Inpatient level of care appropriate due to severity of illness  Dispo:  Patient From: Home  Planned Disposition: SNF versus home health  Medically stable for discharge: No           Consultants:    Procedures:  Echocardiogram  Antimicrobials:  Ancef 9/19 >   Subjective: Reports that pain in chest and right lower leg has significantly improved since yesterday.  Objective: Vitals:   07/17/21 0946 07/17/21 1110 07/17/21 1551 07/17/21 2056  BP: (!) 179/91 (!) 168/92 138/73 (!) 165/89  Pulse: 71 68 66 63  Resp:  16 18 18   Temp:  98 F (36.7 C) 98.1 F (36.7 C) 97.9 F (36.6 C)  TempSrc:  Oral Oral Oral  SpO2:  98% 99% 100%  Weight:      Height:        Intake/Output Summary (Last 24 hours) at 07/17/2021 2108 Last data filed at 07/17/2021 0815 Gross per 24 hour  Intake 753.56 ml  Output 725 ml  Net 28.56 ml   Filed Weights   07/13/21 1243  Weight: 73 kg    Examination:  General exam: Alert, awake, oriented x 3 Respiratory system: Clear to auscultation. Respiratory effort normal. Cardiovascular system:RRR. No murmurs, rubs, gallops. Gastrointestinal system: Abdomen is nondistended, soft and nontender. No organomegaly or masses felt. Normal bowel sounds heard. Central nervous system: Alert and oriented. No focal neurological deficits. Extremities: No C/C/E, +pedal pulses Skin: Erythema over right lower extremity improving Psychiatry: Judgement and insight appear normal. Mood & affect appropriate.      Data Reviewed: I have personally reviewed following labs and imaging studies  CBC: Recent Labs  Lab 07/13/21 1250 07/14/21 0758 07/15/21 0710 07/16/21 0414 07/17/21 0459  WBC 12.7* 14.6* 11.3* 8.1 7.0  HGB 15.3 16.5 13.9 13.2 13.5  HCT 42.5 45.5 40.3 37.7* 37.3*  MCV 90.8 89.6 90.4 89.8 89.4  PLT 206 197 195 178 206   Basic Metabolic Panel: Recent Labs  Lab 07/13/21 1250 07/14/21 0758 07/15/21 0710 07/16/21 0414 07/17/21 0459  NA 139 135  132* 134* 134*  K 3.4* 3.6 4.0 3.8 3.4*  CL 104 96* 96* 99 99  CO2 25 26 28 28 26   GLUCOSE 101* 127* 111* 114* 112*  BUN 12 10 15 12 12   CREATININE 1.07 0.98 1.01 0.84 0.82  CALCIUM 9.0 9.2 8.5* 8.3* 8.6*  MG  --  2.1  --   --   --    GFR: Estimated Creatinine Clearance: 64.8 mL/min (by C-G formula based on SCr of 0.82 mg/dL). Liver Function Tests: Recent Labs  Lab 07/14/21 0758 07/15/21 0710 07/16/21 0414  AST 27 32 32  ALT 18 17 19   ALKPHOS 49 47 52  BILITOT 1.8* 1.5* 1.4*  PROT 8.8* 7.1 6.7  ALBUMIN 4.2 3.3* 3.0*   Recent Labs  Lab 07/14/21 0758  LIPASE 26  AMYLASE 27*   No results for input(s): AMMONIA in the last 168 hours. Coagulation Profile: No results for input(s): INR, PROTIME in the last 168 hours. Cardiac Enzymes: No results for input(s): CKTOTAL, CKMB, CKMBINDEX, TROPONINI in the last 168 hours. BNP (last 3 results) No results for input(s): PROBNP in the last 8760 hours. HbA1C: No results for input(s): HGBA1C in the last 72 hours. CBG: No results for input(s): GLUCAP in the last 168 hours. Lipid Profile: No results for input(s): CHOL, HDL, LDLCALC, TRIG, CHOLHDL, LDLDIRECT in the last 72 hours.  Thyroid Function Tests: No results for input(s): TSH, T4TOTAL,  FREET4, T3FREE, THYROIDAB in the last 72 hours. Anemia Panel: No results for input(s): VITAMINB12, FOLATE, FERRITIN, TIBC, IRON, RETICCTPCT in the last 72 hours. Sepsis Labs: No results for input(s): PROCALCITON, LATICACIDVEN in the last 168 hours.  Recent Results (from the past 240 hour(s))  Resp Panel by RT-PCR (Flu A&B, Covid) Nasopharyngeal Swab     Status: None   Collection Time: 07/13/21 10:55 PM   Specimen: Nasopharyngeal Swab; Nasopharyngeal(NP) swabs in vial transport medium  Result Value Ref Range Status   SARS Coronavirus 2 by RT PCR NEGATIVE NEGATIVE Final    Comment: (NOTE) SARS-CoV-2 target nucleic acids are NOT DETECTED.  The SARS-CoV-2 RNA is generally detectable in upper  respiratory specimens during the acute phase of infection. The lowest concentration of SARS-CoV-2 viral copies this assay can detect is 138 copies/mL. A negative result does not preclude SARS-Cov-2 infection and should not be used as the sole basis for treatment or other patient management decisions. A negative result may occur with  improper specimen collection/handling, submission of specimen other than nasopharyngeal swab, presence of viral mutation(s) within the areas targeted by this assay, and inadequate number of viral copies(<138 copies/mL). A negative result must be combined with clinical observations, patient history, and epidemiological information. The expected result is Negative.  Fact Sheet for Patients:  BloggerCourse.com  Fact Sheet for Healthcare Providers:  SeriousBroker.it  This test is no t yet approved or cleared by the [place] FDA and  has been authorized for detection and/or diagnosis of SARS-CoV-2 by FDA under an Emergency Use Authorization (EUA). This EUA will remain  in effect (meaning this test can be used) for the duration of the COVID-19 declaration under Section 564(b)(1) of the Act, 21 U.S.C.section 360bbb-3(b)(1), unless the authorization is terminated  or revoked sooner.       Influenza A by PCR NEGATIVE NEGATIVE Final   Influenza B by PCR NEGATIVE NEGATIVE Final    Comment: (NOTE) The Xpert Xpress SARS-CoV-2/FLU/RSV plus assay is intended as an aid in the diagnosis of influenza from Nasopharyngeal swab specimens and should not be used as a sole basis for treatment. Nasal washings and aspirates are unacceptable for Xpert Xpress SARS-CoV-2/FLU/RSV testing.  Fact Sheet for Patients: BloggerCourse.com  Fact Sheet for Healthcare Providers: SeriousBroker.it  This test is not yet approved or cleared by the [place] FDA and has been  authorized for detection and/or diagnosis of SARS-CoV-2 by FDA under an Emergency Use Authorization (EUA). This EUA will remain in effect (meaning this test can be used) for the duration of the COVID-19 declaration under Section 564(b)(1) of the Act, 21 U.S.C. section 360bbb-3(b)(1), unless the authorization is terminated or revoked.  Performed at [facility], [address] [place], [place] [address]   Urine Culture     Status: Abnormal   Collection Time: 07/15/21  5:25 AM   Specimen: Urine, Random  Result Value Ref Range Status   Specimen Description   Final    URINE, RANDOM Performed at [facility], [address] [place], [place] [address]    Special Requests   Final    Immunocompromised Performed at [facility], [address] [place], [place] [address]    Culture MULTIPLE SPECIES PRESENT, SUGGEST RECOLLECTION (A)  Final   Report Status 07/16/2021 FINAL  Final  Culture, blood (routine x 2)     Status: None (Preliminary result)   Collection Time: 07/16/21  4:33 PM   Specimen: BLOOD  Result Value Ref Range Status  Specimen Description BLOOD RIGHT ANTECUBITAL  Final   Special Requests   Final    BOTTLES DRAWN AEROBIC AND ANAEROBIC Blood Culture adequate volume   Culture   Final    NO GROWTH < 12 HOURS Performed at [facility], [address] [place], [place] [address]    Report Status PENDING  Incomplete  Culture, blood (routine x 2)     Status: None (Preliminary result)   Collection Time: 07/16/21  4:45 PM   Specimen: BLOOD  Result Value Ref Range Status   Specimen Description BLOOD BLOOD RIGHT HAND  Final   Special Requests   Final    BOTTLES DRAWN AEROBIC AND ANAEROBIC Blood Culture adequate volume   Culture   Final    NO GROWTH < 12 HOURS Performed at [facility], [address] [place], [place] [address]    Report Status PENDING  Incomplete         Radiology Studies: No results  found.      Scheduled Meds:  abacavir-dolutegravir-lamiVUDine  1 tablet Oral Daily   atorvastatin  40 mg Oral Daily   DULoxetine  30 mg Oral Daily   dutasteride  0.5 mg Oral Daily   enoxaparin (LOVENOX) injection  40 mg Subcutaneous Q24H   ketotifen  1 drop Both Eyes BID   metoprolol succinate  25 mg Oral Daily   pantoprazole  40 mg Oral Daily   senna-docusate  1 tablet Oral QHS   tamsulosin  0.4 mg Oral Daily   Continuous Infusions:   ceFAZolin (ANCEF) IV Stopped (07/17/21 1141)   lactated ringers 75 mL/hr at 07/16/21 0308     LOS: 3 days    Time spent:    Erick Blinks, MD [facility]   If 7PM-7AM, please contact night-coverage [email or web address]  07/17/2021, 9:08 PM

## 2021-07-17 NOTE — Progress Notes (Signed)
Physical Therapy Treatment Patient Details Name: Brian [name] MRN: [id number] DOB: [birth year] Today's Date: 07/17/2021   History of Present Illness 80 y.o. male with medical history significant for HIV/AIDS, pacemaker for symptomatic bradycardia, hyperlipidemia, who presented to [facility] ED from home with complaints of chest pain, substernal, radiating to his thoracic back.  Associated with dyspnea on exertion.  Also reported left lower extremity pain with difficulty bearing weight for the past 3 days.  No falls.  He went to his primary care physician today with the same complaints.  He was advised to go to the ED for further evaluation.  Work-up in the ED was unrevealing, troponin negative x2.  CTA chest/AP negative for PE, negative for dissection.  His chest pain persisted in the ED despite IV morphine.    PT Comments n house interpreter Marchelle Folks present for session.  Pt reports feeling better today.  Denies pain at rest.  Able to get to EOB with out assist.  Is able to walk to bathroom to void in standing and no assist to manage clothing. He continues to walk x 1 lap around unit with RW and good speed.  No LOB or buckling and generally steady safe gait demonstrated.  He does remain in recliner at end of session and requests some pain meds for increased chest pain with mobility.   Overall good mobility and tolerance for activity today.  Discharge recommendations upgraded to reflect progression of mobility.     Recommendations for follow up therapy are one component of a multi-disciplinary discharge planning process, led by the attending physician.  Recommendations may be updated based on patient status, additional functional criteria and insurance authorization.  Follow Up Recommendations  Home health PT;Supervision for mobility/OOB     Equipment Recommendations  Rolling walker with 5" wheels;3in1 (PT)    Recommendations for Other Services       Precautions / Restrictions  Precautions Precautions: Fall Restrictions Weight Bearing Restrictions: No     Mobility  Bed Mobility Overal bed mobility: Modified Independent Bed Mobility: Supine to Sit     Supine to sit: Modified independent (Device/Increase time)          Transfers Overall transfer level: Needs assistance Equipment used: Rolling walker (2 wheeled) Transfers: Sit to/from Stand Sit to Stand: Min guard            Ambulation/Gait Ambulation/Gait assistance: Min guard Gait Distance (Feet): 180 Feet Assistive device: Rolling walker (2 wheeled) Gait Pattern/deviations: Step-through pattern;Decreased step length - right;Decreased step length - left Gait velocity: WFL   General Gait Details: completes lap this session with no LOB or buckling   Stairs             Wheelchair Mobility    Modified Rankin (Stroke Patients Only)       Balance Overall balance assessment: Needs assistance Sitting-balance support: Feet supported Sitting balance-Leahy Scale: Good     Standing balance support: During functional activity;Bilateral upper extremity supported Standing balance-Leahy Scale: Fair Standing balance comment: abe to manage clothing today for toileting wihtout LOB as in prior sessions                            Cognition Arousal/Alertness: Awake/alert Behavior During Therapy: WFL for tasks assessed/performed Overall Cognitive Status: Within Functional Limits for tasks assessed  Exercises      General Comments        Pertinent Vitals/Pain Pain Assessment: Faces Faces Pain Scale: Hurts a little bit Pain Location: chest after mobility Pain Descriptors / Indicators: Sore Pain Intervention(s): Limited activity within patient's tolerance;Monitored during session;Patient requesting pain meds-RN notified;Repositioned    Home Living                      Prior Function            PT Goals  (current goals can now be found in the care plan section) Progress towards PT goals: Progressing toward goals    Frequency    Min 2X/week      PT Plan Discharge plan needs to be updated    Co-evaluation              AM-PAC PT "6 Clicks" Mobility   Outcome Measure  Help needed turning from your back to your side while in a flat bed without using bedrails?: None Help needed moving from lying on your back to sitting on the side of a flat bed without using bedrails?: None Help needed moving to and from a bed to a chair (including a wheelchair)?: A Little Help needed standing up from a chair using your arms (e.g., wheelchair or bedside chair)?: A Little Help needed to walk in hospital room?: A Little Help needed climbing 3-5 steps with a railing? : A Little 6 Click Score: 20    End of Session Equipment Utilized During Treatment: Gait belt;Oxygen Activity Tolerance: Patient tolerated treatment well Patient left: in chair;with call bell/phone within reach;with chair alarm set Nurse Communication: Patient requests pain meds PT Visit Diagnosis: Unsteadiness on feet (R26.81);Other abnormalities of gait and mobility (R26.89);Difficulty in walking, not elsewhere classified (R26.2);Pain;Muscle weakness (generalized) (M62.81) Pain - Right/Left: Right Pain - part of body: Leg     Time: 0156-0212 PT Time Calculation (min) (ACUTE ONLY): 16 min  Charges:  $Gait Training: 8-22 mins                    Danielle Dess, PTA 07/17/21, 2:22 PM

## 2021-07-18 DIAGNOSIS — R072 Precordial pain: Secondary | ICD-10-CM

## 2021-07-18 DIAGNOSIS — B20 Human immunodeficiency virus [HIV] disease: Secondary | ICD-10-CM | POA: Diagnosis not present

## 2021-07-18 LAB — CBC
HCT: 39.7 % (ref 39.0–52.0)
Hemoglobin: 14.5 g/dL (ref 13.0–17.0)
MCH: 32.4 pg (ref 26.0–34.0)
MCHC: 36.5 g/dL — ABNORMAL HIGH (ref 30.0–36.0)
MCV: 88.8 fL (ref 80.0–100.0)
Platelets: 249 10*3/uL (ref 150–400)
RBC: 4.47 MIL/uL (ref 4.22–5.81)
RDW: 13.3 % (ref 11.5–15.5)
WBC: 7.2 10*3/uL (ref 4.0–10.5)
nRBC: 0 % (ref 0.0–0.2)

## 2021-07-18 LAB — COMPREHENSIVE METABOLIC PANEL
ALT: 30 U/L (ref 0–44)
AST: 48 U/L — ABNORMAL HIGH (ref 15–41)
Albumin: 3.2 g/dL — ABNORMAL LOW (ref 3.5–5.0)
Alkaline Phosphatase: 105 U/L (ref 38–126)
Anion gap: 11 (ref 5–15)
BUN: 13 mg/dL (ref 8–23)
CO2: 26 mmol/L (ref 22–32)
Calcium: 8.6 mg/dL — ABNORMAL LOW (ref 8.9–10.3)
Chloride: 100 mmol/L (ref 98–111)
Creatinine, Ser: 0.72 mg/dL (ref 0.61–1.24)
GFR, Estimated: >60 mL/min (ref >60)
Glucose, Bld: 105 mg/dL — ABNORMAL HIGH (ref 70–99)
Potassium: 3.5 mmol/L (ref 3.5–5.1)
Sodium: 137 mmol/L (ref 135–145)
Total Bilirubin: 0.9 mg/dL (ref 0.3–1.2)
Total Protein: 7.6 g/dL (ref 6.5–8.1)

## 2021-07-18 MED ORDER — CEPHALEXIN 250 MG PO CAPS: 250.0000 mg | ORAL_CAPSULE | Freq: Four times a day (QID) | ORAL | 0 refills | Status: AC

## 2021-07-18 MED ORDER — CEPHALEXIN 250 MG PO CAPS
250.0000 mg | ORAL_CAPSULE | Freq: Four times a day (QID) | ORAL | Status: DC
  Filled 2021-07-18 (×2): qty 1

## 2021-07-18 NOTE — Discharge Instructions (Signed)
Please complete antibiotics as directed on medication instructions. Follow up with primary doctor within a week for follow up of infection.

## 2021-07-18 NOTE — Discharge Summary (Signed)
Physician Discharge Summary  Humza Tallerico NOM:[id number] DOB: 1941-08-03 DOA: 07/13/2021  PCP: Armando Gang, FNP  Admit date: 07/13/2021 Discharge date: 07/18/2021  Admitted From: home Disposition:  home with grandson  Recommendations for Outpatient Follow-up:  Follow up with PCP in 1-2 weeks Please obtain BMP/CBC in one week  Home Health:PT Equipment/Devices:3 in 1, walker  Discharge Condition:stable, improved CODE STATUS:full Diet recommendation: regular  Brief/Interim Summary: 80 year old male with a history of HIV/AIDS, pacemaker placement for symptomatic bradycardia, hyperlipidemia, BPH, admitted to the hospital with progressive substernal chest pain, radiating to his back.  Work-up in the emergency room was unrevealing with negative cardiac enzymes and no acute EKG changes.  CT of the chest was negative for dissection.  Since he had continued symptoms, he was admitted for further evaluation. He was evaluated and treated for additional problem of R LE cellulitis. He improved on Abx and had negative blood cultures. Discharged with PO Abx.   Discharge Diagnoses:  Active Problems:   Bronchial asthma   HIV (human immunodeficiency virus infection) (HCC)   BPH (benign prostatic hyperplasia)   Essential hypertension   Chest pain   Leg pain    Allergies as of 07/18/2021   No Known Allergies      Medication List     TAKE these medications    alendronate 70 MG tablet Commonly known as: FOSAMAX Take 70 mg by mouth once a week. Take on Saturday.   atorvastatin 40 MG tablet Commonly known as: LIPITOR Take 40 mg by mouth daily.   azelastine 0.05 % ophthalmic solution Commonly known as: OPTIVAR Apply 1 drop to eye 2 (two) times daily.   cephALEXin 250 MG capsule Commonly known as: KEFLEX Take 1 capsule (250 mg total) by mouth every 6 (six) hours for 3 days.   clonazePAM 0.5 MG tablet Commonly known as: KLONOPIN Take 0.5 mg by mouth 2 (two) times daily as needed  for anxiety.   DULoxetine 30 MG capsule Commonly known as: CYMBALTA Take 30 mg by mouth daily.   dutasteride 0.5 MG capsule Commonly known as: AVODART Take 1 capsule (0.5 mg total) by mouth daily.   loratadine 10 MG tablet Commonly known as: CLARITIN Take 10 mg by mouth daily.   metoprolol succinate 25 MG 24 hr tablet Commonly known as: TOPROL-XL Take 1 tablet (25 mg total) by mouth daily.   nystatin-triamcinolone ointment Commonly known as: MYCOLOG Apply topically 2 (two) times daily.   omeprazole 20 MG capsule Commonly known as: PRILOSEC Take 20 mg by mouth daily.   tamsulosin 0.4 MG Caps capsule Commonly known as: FLOMAX Take 1 capsule (0.4 mg total) by mouth daily.   traMADol-acetaminophen 37.5-325 MG tablet Commonly known as: ULTRACET Take 1 tablet by mouth daily as needed.   triamcinolone cream 0.1 % Commonly known as: KENALOG Apply 1 application topically 2 (two) times daily.   Triumeq 600-50-300 MG tablet Generic drug: abacavir-dolutegravir-lamiVUDine T[place] 1 tableta por va oral diariamente. (Take 1 tablet by mouth daily.)   Ventolin HFA 108 (90 Base) MCG/ACT inhaler Generic drug: albuterol Inhale 2 puffs into the lungs 4 (four) times daily as needed. For shortness of breath and/or wheezing.               Durable Medical Equipment  (From admission, onward)           Start     Ordered   07/18/21 0943  For home use only DME 3 n 1  Once  07/18/21 0942   07/18/21 0942  For home use only DME Walker rolling  Once       Question Answer Comment  Walker: With 5 Inch Wheels   Patient needs a walker to treat with the following condition Impaired ambulation      07/18/21 0941            No Known Allergies  Consultations: none   Procedures/Studies: DG Chest 2 View  Result Date: 07/13/2021 CLINICAL DATA:  Chest pain EXAM: CHEST - 2 VIEW COMPARISON:  03/08/2021 FINDINGS: Cardiomegaly with left chest multi lead pacer. Both lungs are  clear. The visualized skeletal structures are unremarkable. IMPRESSION: Cardiomegaly without acute abnormality of the lungs. Electronically Signed   By: Lauralyn Primes M.D.   On: 07/13/2021 13:45   DG Ankle Complete Left  Result Date: 07/13/2021 CLINICAL DATA:  Foot and ankle pain EXAM: LEFT ANKLE COMPLETE - 3+ VIEW COMPARISON:  None. FINDINGS: There is no evidence of fracture, dislocation, or joint effusion. There is no evidence of arthropathy or other focal bone abnormality. Soft tissues are unremarkable. IMPRESSION: Negative. Electronically Signed   By: Jasmine Pang M.D.   On: 07/13/2021 15:23   CT CERVICAL SPINE WO CONTRAST  Result Date: 07/15/2021 CLINICAL DATA:  Neck and back pain EXAM: CT CERVICAL SPINE WITHOUT CONTRAST CT THORACIC SPINE WITHOUT CONTRAST TECHNIQUE: Multidetector CT imaging of the cervical and thoracic spine was performed without contrast. Multiplanar CT image reconstructions were also generated. COMPARISON:  None. FINDINGS: CT CERVICAL SPINE FINDINGS Alignment: Normal. Skull base and vertebrae: No acute fracture. No primary bone lesion or focal pathologic process. Soft tissues and spinal canal: No prevertebral fluid or swelling. No visible canal hematoma. Disc levels: Uncovertebral osteophytes at the C4-5 and C5-6 levels. No high-grade spinal canal stenosis. Upper chest: Negative. Other: None. CT THORACIC SPINE FINDINGS Alignment: Normal Vertebrae: No acute fracture or focal pathologic process. Paraspinal and other soft tissues: Multifocal atelectasis and calcific aortic atherosclerosis. Enlarged caudate lobe of the liver comment is visualized. Disc levels: IMPRESSION: 1. No acute fracture or static subluxation of the cervical or thoracic spine Aortic Atherosclerosis (ICD10-I70.0). Electronically Signed   By: Deatra Robinson M.D.   On: 07/15/2021 20:08   CT THORACIC SPINE WO CONTRAST  Result Date: 07/15/2021 CLINICAL DATA:  Neck and back pain EXAM: CT CERVICAL SPINE WITHOUT CONTRAST  CT THORACIC SPINE WITHOUT CONTRAST TECHNIQUE: Multidetector CT imaging of the cervical and thoracic spine was performed without contrast. Multiplanar CT image reconstructions were also generated. COMPARISON:  None. FINDINGS: CT CERVICAL SPINE FINDINGS Alignment: Normal. Skull base and vertebrae: No acute fracture. No primary bone lesion or focal pathologic process. Soft tissues and spinal canal: No prevertebral fluid or swelling. No visible canal hematoma. Disc levels: Uncovertebral osteophytes at the C4-5 and C5-6 levels. No high-grade spinal canal stenosis. Upper chest: Negative. Other: None. CT THORACIC SPINE FINDINGS Alignment: Normal Vertebrae: No acute fracture or focal pathologic process. Paraspinal and other soft tissues: Multifocal atelectasis and calcific aortic atherosclerosis. Enlarged caudate lobe of the liver comment is visualized. Disc levels: IMPRESSION: 1. No acute fracture or static subluxation of the cervical or thoracic spine Aortic Atherosclerosis (ICD10-I70.0). Electronically Signed   By: Deatra Robinson M.D.   On: 07/15/2021 20:08   US Venous Img Lower Unilateral Right (DVT)  Result Date: 07/16/2021 CLINICAL DATA:  RIGHT leg pain. EXAM: RIGHT LOWER EXTREMITY VENOUS DOPPLER ULTRASOUND TECHNIQUE: Gray-scale sonography with compression, as well as color and duplex ultrasound, were performed to evaluate the deep  venous system(s) from the level of the common femoral vein through the popliteal and proximal calf veins. COMPARISON:  CTA CAP, 07/13/2021. FINDINGS: VENOUS Normal compressibility of the common femoral, superficial femoral, and popliteal veins, as well as the visualized calf veins. Visualized portions of profunda femoral vein and great saphenous vein unremarkable. No filling defects to suggest DVT on grayscale or color Doppler imaging. Doppler waveforms show normal direction of venous flow, normal respiratory plasticity and response to augmentation. Limited views of the contralateral  common femoral vein are unremarkable. OTHER No evidence of superficial thrombophlebitis or abnormal fluid collection. Limitations: none IMPRESSION: No evidence of femoropopliteal DVT within the RIGHT lower extremity. Roanna Banning, MD [facility] Electronically Signed   By: Roanna Banning M.D.   On: 07/16/2021 07:37   DG Foot Complete Left  Result Date: 07/13/2021 CLINICAL DATA:  Foot and ankle pain EXAM: LEFT FOOT - COMPLETE 3+ VIEW COMPARISON:  None. FINDINGS: No fracture or malalignment. Mild degenerative changes at the first MTP joint. Mild vascular calcifications. IMPRESSION: No acute osseous abnormality Electronically Signed   By: Jasmine Pang M.D.   On: 07/13/2021 15:23   ECHOCARDIOGRAM COMPLETE  Result Date: 07/14/2021    ECHOCARDIOGRAM REPORT   Patient Name:   Brian [name] Date of Exam: 07/14/2021 Medical Rec #:  [id number]    Height:       66.0 in Accession #:    [id number]   Weight:       160.9 lb Date of Birth:  Oct 18, 1941     BSA:          1.823 m Patient Age:    80 years     BP:           122/83 mmHg Patient Gender: M            HR:           89 bpm. Exam Location:  [facility] Procedure: 2D Echo, Cardiac Doppler and Color Doppler Indications:     Chest Pain R07.9  History:         Patient has prior history of Echocardiogram examinations.                  Huston Foley, HLD.  Sonographer:     Neysa Bonito Roar Referring Phys:  [id number] Oliver Pila HALL Diagnosing Phys: Julien Nordmann MD IMPRESSIONS  1. Left ventricular ejection fraction, by estimation, is 50 %. The left ventricle has low normal function. 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 . Left ventricular diastolic parameters are consistent with Grade I diastolic dysfunction (impaired relaxation). The average left ventricular global longitudinal strain is -16.6 %.  2. Right ventricular systolic function is normal. The right ventricular size is normal. Tricuspid regurgitation signal is  inadequate for assessing PA pressure.  3. The mitral valve is normal in structure. No evidence of mitral valve regurgitation. No evidence of mitral stenosis. FINDINGS  Left Ventricle: Left ventricular ejection fraction, by estimation, is 50 to 55%. The left ventricle has low normal function. The left ventricle has no regional wall motion abnormalities. The average left ventricular global longitudinal strain is -16.6 %. The left ventricular internal cavity size was normal in size. There is no left ventricular hypertrophy. Left ventricular diastolic parameters are consistent with Grade I diastolic dysfunction (impaired relaxation). Right Ventricle: The right ventricular size is normal. No increase in right ventricular wall thickness. Right ventricular systolic function is normal. Tricuspid regurgitation signal is inadequate for assessing  PA pressure. Left Atrium: Left atrial size was normal in size. Right Atrium: Right atrial size was normal in size. Pericardium: There is no evidence of pericardial effusion. Mitral Valve: The mitral valve is normal in structure. No evidence of mitral valve regurgitation. No evidence of mitral valve stenosis. Tricuspid Valve: The tricuspid valve is normal in structure. Tricuspid valve regurgitation is mild . No evidence of tricuspid stenosis. Aortic Valve: The aortic valve was not well visualized. Aortic valve regurgitation is not visualized. No aortic stenosis is present. Aortic valve peak gradient measures 7.4 mmHg. Pulmonic Valve: The pulmonic valve was normal in structure. Pulmonic valve regurgitation is not visualized. No evidence of pulmonic stenosis. Aorta: The aortic root is normal in size and structure. Venous: The inferior vena cava is normal in size with greater than 50% respiratory variability, suggesting right atrial pressure of 3 mmHg. IAS/Shunts: No atrial level shunt detected by color flow Doppler.  LEFT VENTRICLE PLAX 2D LVIDd:         4.60 cm  Diastology LVIDs:          3.40 cm  LV e' medial:    3.37 cm/s LV PW:         1.00 cm  LV E/e' medial:  14.4 LV IVS:        1.00 cm  LV e' lateral:   5.11 cm/s LVOT diam:     1.90 cm  LV E/e' lateral: 9.5 LVOT Area:     2.84 cm                         2D Longitudinal Strain                         2D Strain GLS Avg:     -16.6 % RIGHT VENTRICLE RV Mid diam:    3.00 cm RV S prime:     7.62 cm/s TAPSE (M-mode): 1.4 cm LEFT ATRIUM             Index LA diam:        3.50 cm 1.92 cm/m LA Vol (A2C):   27.3 ml 14.97 ml/m LA Vol (A4C):   28.5 ml 15.63 ml/m LA Biplane Vol: 28.7 ml 15.74 ml/m  AORTIC VALVE                PULMONIC VALVE AV Area (Vmax): 1.90 cm    PV Vmax:          0.84 m/s AV Vmax:        136.00 cm/s PV Peak grad:     2.8 mmHg AV Peak Grad:   7.4 mmHg    PR End Diast Vel: 5.20 msec LVOT Vmax:      91.20 cm/s  RVOT Peak grad:   2 mmHg  AORTA Ao Root diam: 3.50 cm MITRAL VALVE MV Area (PHT): 3.77 cm    SHUNTS MV Decel Time: 201 msec    Systemic Diam: 1.90 cm MV E velocity: 48.40 cm/s MV A velocity: 89.50 cm/s MV E/A ratio:  0.54 MV A Prime:    12.8 cm/s Julien Nordmann MD Electronically signed by Julien Nordmann MD Signature Date/Time: 07/14/2021/2:48:31 PM    Final    CT Angio Chest/Abd/Pel for Dissection W and/or Wo Contrast  Result Date: 07/13/2021 CLINICAL DATA:  Abdominal pain, chest pain, suspected dissection EXAM: CT ANGIOGRAPHY CHEST, ABDOMEN AND PELVIS TECHNIQUE: Multidetector CT imaging through the chest, abdomen and pelvis was performed  using the standard protocol during bolus administration of intravenous contrast. Multiplanar reconstructed images and MIPs were obtained and reviewed to evaluate the vascular anatomy. CONTRAST:  OMNIPAQUE IOHEXOL 350 MG/ML SOLN COMPARISON:  04/24/2021 and previous FINDINGS: CTA CHEST FINDINGS Cardiovascular: No hyperdense intramural hematoma on the precontrast images. Left subclavian transvenous pacing leads extend to the right atrium and coronary sinus. The RV is nondilated.  Satisfactory opacification of pulmonary arteries noted, and there is no evidence of pulmonary emboli. There is good contrast opacification of the thoracic aorta. 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 This extends over length of approximately 2.5 cm, with the depth of approximately 6 mm. No evidence of dissection or stenosis. Scattered calcified atheromatous plaque in ascending, arch, and descending segments. Bovine variant brachiocephalic arterial origin anatomy without proximal stenosis. Aortic Root: --Valve: 2.5 cm --Sinuses: 4.3 cm --Sinotubular Junction: 3.2 cm Limitations by motion: Moderate Thoracic Aorta: --Ascending Aorta: 4 cm --Aortic Arch: 4 cm --Descending Aorta: 3.4 cm Mediastinum/Nodes: No mediastinal hematoma.  No mass or adenopathy. Lungs/Pleura: No pleural effusion. No pneumothorax. Persistent irregular bandlike ground-glass opacities peripherally with some subpleural sparing. 1.1 cm nodular opacity in the inferior lingula, stable since previous but new since 04/02/2017. Musculoskeletal: Anterior vertebral endplate spurring at multiple levels in the lower thoracic spine. Review of the MIP images confirms the above findings. CTA ABDOMEN AND PELVIS FINDINGS VASCULAR Aorta: Calcified atheromatous plaque throughout. No aneurysm, dissection, or stenosis. Celiac: Patent without evidence of aneurysm, dissection, vasculitis or significant stenosis. SMA: Patent without evidence of aneurysm, dissection, vasculitis or significant stenosis. Renals: Single right, widely patent. Duplicated left, superior dominant, both patent. IMA: Patent without evidence of aneurysm, dissection, vasculitis or significant stenosis. Veins: No obvious venous abnormality within the limitations of this arterial phase study. Review of the MIP images confirms the above findings. NON-VASCULAR Hepatobiliary: No focal liver abnormality is seen. No gallstones, gallbladder wall thickening, or  biliary dilatation. Pancreas: Unremarkable. No pancreatic ductal dilatation or surrounding inflammatory changes. Spleen: Normal in size without focal abnormality. Adrenals/Urinary Tract: Kidneys unremarkable. Symmetric renal enhancement without focal lesion. No urolithiasis. The urinary bladder is distended, thick-walled, with a left lateral diverticulum.Urethral stent. Stomach/Bowel: Stomach is incompletely distended. The small bowel is decompressed. Appendix not identified. The colon is nondilated with a few scattered diverticula. Abrupt caliber change in the distal sigmoid segment with some apparent eccentric circumferential wall thickening. No adjacent inflammatory change. Lymphatic: Subcentimeter left para-aortic lymph nodes. No mesenteric or pelvic adenopathy. Reproductive: Prostate enlargement with scattered coarse calcifications. Other: No ascites.  No free air. Musculoskeletal: Mild spondylitic changes in the lumbar spine. No fracture or worrisome bone lesion. Review of the MIP images confirms the above findings. IMPRESSION: 1. 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 2. 4 cm ascending thoracic aortic aneurysm. Recommend annual imaging followup by CTA or MRA. This recommendation follows 2010 ACCF/AHA/AATS/ACR/ASA/SCA/SCAI/SIR/STS/SVM Guidelines for the Diagnosis and Management of Patients with Thoracic Aortic Disease. Circulation. 2010; 121: N629-B284 3. Caliber change in the distal sigmoid colon with apparent segmental circumferential wall thickening, which may be mimicked by under distension. Consider colonoscopy or barium enema to exclude neoplasm. Electronically Signed   By: Corlis Leak M.D.   On: 07/13/2021 16:48    Subjective: Patient feels well today. He is anxious to go home. His grandson is at bedside and took 3 weeks off work to stay with and care for him. He denies any  concerns or complaints today.  Discharge Exam: Vitals:   07/18/21 0824 07/18/21 1120  BP: (!) 169/66 (!) 142/97  Pulse: 68 92  Resp: 20 18  Temp: 98 F (36.7 C) 98.6 F (37 C)  SpO2: 100% 97%   Vitals:   07/17/21 1551 07/17/21 2056 07/18/21 0824 07/18/21 1120  BP: 138/73 (!) 165/89 (!) 169/66 (!) 142/97  Pulse: 66 63 68 92  Resp: Temp: 98.1 F (36.7 C) 97.9 F (36.6 C) 98 F (36.7 C) 98.6 F (37 C)  TempSrc: Oral Oral Oral   SpO2: 99% 100% 100% 97%  Weight:      Height:        General: Pt is alert, awake, not in acute distress Cardiovascular: RRR, S1/S2 +, no rubs, no gallops Respiratory: CTA bilaterally, no wheezing, no rhonchi Abdominal: Soft, NT, ND, bowel sounds + Extremities: no edema, no cyanosis  Microbiology: Recent Results (from the past 240 hour(s))  Resp Panel by RT-PCR (Flu A&B, Covid) Nasopharyngeal Swab     Status: None   Collection Time: 07/13/21 10:55 PM   Specimen: Nasopharyngeal Swab; Nasopharyngeal(NP) swabs in vial transport medium  Result Value Ref Range Status   SARS Coronavirus 2 by RT PCR NEGATIVE NEGATIVE Final    Comment: (NOTE) SARS-CoV-2 target nucleic acids are NOT DETECTED.  The SARS-CoV-2 RNA is generally detectable in upper respiratory specimens during the acute phase of infection. The lowest concentration of SARS-CoV-2 viral copies this assay can detect is 138 copies/mL. A negative result does not preclude SARS-Cov-2 infection and should not be used as the sole basis for treatment or other patient management decisions. A negative result may occur with  improper specimen collection/handling, submission of specimen other than nasopharyngeal swab, presence of viral mutation(s) within the areas targeted by this assay, and inadequate number of viral copies(<138 copies/mL). A negative result must be combined with clinical observations, patient history, and epidemiological information. The expected result is  Negative.  Fact Sheet for Patients:  BloggerCourse.com  Fact Sheet for Healthcare Providers:  SeriousBroker.it  This test is no t yet approved or cleared by the [place] FDA and  has been authorized for detection and/or diagnosis of SARS-CoV-2 by FDA under an Emergency Use Authorization (EUA). This EUA will remain  in effect (meaning this test can be used) for the duration of the COVID-19 declaration under Section 564(b)(1) of the Act, 21 U.S.C.section 360bbb-3(b)(1), unless the authorization is terminated  or revoked sooner.       Influenza A by PCR NEGATIVE NEGATIVE Final   Influenza B by PCR NEGATIVE NEGATIVE Final    Comment: (NOTE) The Xpert Xpress SARS-CoV-2/FLU/RSV plus assay is intended as an aid in the diagnosis of influenza from Nasopharyngeal swab specimens and should not be used as a sole basis for treatment. Nasal washings and aspirates are unacceptable for Xpert Xpress SARS-CoV-2/FLU/RSV testing.  Fact Sheet for Patients: BloggerCourse.com  Fact Sheet for Healthcare Providers: SeriousBroker.it  This test is not yet approved or cleared by the [place] FDA and has been authorized for detection and/or diagnosis of SARS-CoV-2 by FDA under an Emergency Use Authorization (EUA). This EUA will remain in effect (meaning this test can be used) for the duration of the COVID-19 declaration under Section 564(b)(1) of the Act, 21 U.S.C. section 360bbb-3(b)(1), unless the authorization is terminated or revoked.  Performed at [facility], [address], [place], [place] [address]   Urine Culture     Status: Abnormal  Collection Time: 07/15/21  5:25 AM   Specimen: Urine, Random  Result Value Ref Range Status   Specimen Description   Final    URINE, RANDOM Performed at [facility], [address] [place], [place] [address]    Special  Requests   Final    Immunocompromised Performed at [facility], [address] [place], [place] [address]    Culture MULTIPLE SPECIES PRESENT, SUGGEST RECOLLECTION (A)  Final   Report Status 07/16/2021 FINAL  Final  Culture, blood (routine x 2)     Status: None (Preliminary result)   Collection Time: 07/16/21  4:33 PM   Specimen: BLOOD  Result Value Ref Range Status   Specimen Description BLOOD RIGHT ANTECUBITAL  Final   Special Requests   Final    BOTTLES DRAWN AEROBIC AND ANAEROBIC Blood Culture adequate volume   Culture   Final    NO GROWTH 2 DAYS Performed at [facility], [address], [place], [place] [address]    Report Status PENDING  Incomplete  Culture, blood (routine x 2)     Status: None (Preliminary result)   Collection Time: 07/16/21  4:45 PM   Specimen: BLOOD  Result Value Ref Range Status   Specimen Description BLOOD BLOOD RIGHT HAND  Final   Special Requests   Final    BOTTLES DRAWN AEROBIC AND ANAEROBIC Blood Culture adequate volume   Culture   Final    NO GROWTH 2 DAYS Performed at [facility], [address], [place], [place] [address]    Report Status PENDING  Incomplete     Labs: BNP (last 3 results) No results for input(s): BNP in the last 8760 hours. Basic Metabolic Panel: Recent Labs  Lab 07/14/21 0758 07/15/21 0710 07/16/21 0414 07/17/21 0459 07/18/21 0443  NA 135 132* 134* 134* 137  K 3.6 4.0 3.8 3.4* 3.5  CL 96* 96* 99 99 100  CO2 GLUCOSE 127* 111* 114* 112* 105*  BUN CREATININE 0.98 1.01 0.84 0.82 0.72  CALCIUM 9.2 8.5* 8.3* 8.6* 8.6*  MG 2.1  --   --   --   --    Liver Function Tests: Recent Labs  Lab 07/14/21 0758 07/15/21 0710 07/16/21 0414 07/18/21 0443  AST 27 32 32 48*  ALT ALKPHOS 49 47 52 105  BILITOT 1.8* 1.5* 1.4* 0.9  PROT 8.8* 7.1 6.7 7.6  ALBUMIN 4.2 3.3* 3.0* 3.2*   Recent Labs  Lab 07/14/21 0758  LIPASE 26  AMYLASE 27*    No results for input(s): AMMONIA in the last 168 hours. CBC: Recent Labs  Lab 07/14/21 0758 07/15/21 0710 07/16/21 0414 07/17/21 0459 07/18/21 0443  WBC 14.6* 11.3* 8.1 7.0 7.2  HGB 16.5 13.9 13.2 13.5 14.5  HCT 45.5 40.3 37.7* 37.3* 39.7  MCV 89.6 90.4 89.8 89.4 88.8  PLT 197 195 178 206 249   Cardiac Enzymes: No results for input(s): CKTOTAL, CKMB, CKMBINDEX, TROPONINI in the last 168 hours. BNP: Invalid input(s): POCBNP  Microbiology Recent Results (from the past 240 hour(s))  Resp Panel by RT-PCR (Flu A&B, Covid) Nasopharyngeal Swab     Status: None   Collection Time: 07/13/21 10:55 PM   Specimen: Nasopharyngeal Swab; Nasopharyngeal(NP) swabs in vial transport medium  Result Value Ref Range Status   SARS Coronavirus 2 by RT PCR NEGATIVE NEGATIVE Final    Comment: (NOTE) SARS-CoV-2 target nucleic acids  are NOT DETECTED.  The SARS-CoV-2 RNA is generally detectable in upper respiratory specimens during the acute phase of infection. The lowest concentration of SARS-CoV-2 viral copies this assay can detect is 138 copies/mL. A negative result does not preclude SARS-Cov-2 infection and should not be used as the sole basis for treatment or other patient management decisions. A negative result may occur with  improper specimen collection/handling, submission of specimen other than nasopharyngeal swab, presence of viral mutation(s) within the areas targeted by this assay, and inadequate number of viral copies(<138 copies/mL). A negative result must be combined with clinical observations, patient history, and epidemiological information. The expected result is Negative.  Fact Sheet for Patients:  BloggerCourse.com  Fact Sheet for Healthcare Providers:  SeriousBroker.it  This test is no t yet approved or cleared by the [place] FDA and  has been authorized for detection and/or diagnosis of SARS-CoV-2 by FDA under an  Emergency Use Authorization (EUA). This EUA will remain  in effect (meaning this test can be used) for the duration of the COVID-19 declaration under Section 564(b)(1) of the Act, 21 U.S.C.section 360bbb-3(b)(1), unless the authorization is terminated  or revoked sooner.       Influenza A by PCR NEGATIVE NEGATIVE Final   Influenza B by PCR NEGATIVE NEGATIVE Final    Comment: (NOTE) The Xpert Xpress SARS-CoV-2/FLU/RSV plus assay is intended as an aid in the diagnosis of influenza from Nasopharyngeal swab specimens and should not be used as a sole basis for treatment. Nasal washings and aspirates are unacceptable for Xpert Xpress SARS-CoV-2/FLU/RSV testing.  Fact Sheet for Patients: BloggerCourse.com  Fact Sheet for Healthcare Providers: SeriousBroker.it  This test is not yet approved or cleared by the [place] FDA and has been authorized for detection and/or diagnosis of SARS-CoV-2 by FDA under an Emergency Use Authorization (EUA). This EUA will remain in effect (meaning this test can be used) for the duration of the COVID-19 declaration under Section 564(b)(1) of the Act, 21 U.S.C. section 360bbb-3(b)(1), unless the authorization is terminated or revoked.  Performed at [facility], [address] [place], [place] [address]   Urine Culture     Status: Abnormal   Collection Time: 07/15/21  5:25 AM   Specimen: Urine, Random  Result Value Ref Range Status   Specimen Description   Final    URINE, RANDOM Performed at [facility], [address] [place], [place] [address]    Special Requests   Final    Immunocompromised Performed at [facility], [address] [place], [place] [address]    Culture MULTIPLE SPECIES PRESENT, SUGGEST RECOLLECTION (A)  Final   Report Status 07/16/2021 FINAL  Final  Culture, blood (routine x 2)     Status: None (Preliminary result)   Collection Time:  07/16/21  4:33 PM   Specimen: BLOOD  Result Value Ref Range Status   Specimen Description BLOOD RIGHT ANTECUBITAL  Final   Special Requests   Final    BOTTLES DRAWN AEROBIC AND ANAEROBIC Blood Culture adequate volume   Culture   Final    NO GROWTH 2 DAYS Performed at [facility], [address], [place], [place] [address]    Report Status PENDING  Incomplete  Culture, blood (routine x 2)     Status: None (Preliminary result)   Collection Time: 07/16/21  4:45 PM   Specimen: BLOOD  Result Value Ref Range Status   Specimen Description BLOOD BLOOD RIGHT HAND  Final   Special Requests  Final    BOTTLES DRAWN AEROBIC AND ANAEROBIC Blood Culture adequate volume   Culture   Final    NO GROWTH 2 DAYS Performed at [facility], [address] [place], [place] [address]    Report Status PENDING  Incomplete     Time coordinating discharge: Over 30 minutes  Leeroy Bock, MD  [facility] 07/18/2021, 4:58 PM Pager   If 7PM-7AM, please contact night-coverage [email or web address] Password [email or web address]

## 2021-07-18 NOTE — TOC Progression Note (Signed)
Transition of Care [facility]) - Progression Note    Patient Details  Name: Brian [name] MRN: [id number] Date of Birth: [birth year]  Transition of [facility]) CM/SW Contact  Caryn Section, RN Phone Number: 07/18/2021, 9:42 AM  Clinical Narrative:   Lucila Maine at bedside, virtual interpreter used for communication with patient [phone].  Recommendations changed from Physical Therapy to HHPT.  Patient amenable to plan, agrees to [facility].    Lucila Maine will be staying with patient.  No concerns about transportation to appointments, or to the pharmacy, Lucila Maine will assist patient with medications as needed.    DME-walker and 3 n 1 to be delivered to room by [employer], Bjorn Loser notified.  TOC contact information provided         Expected Discharge Plan and Services                                                 Social Determinants of Health (SDOH) Interventions    Readmission Risk Interventions No flowsheet data found.

## 2021-07-18 NOTE — Care Management Important Message (Signed)
Important Message  Patient Details  Name: Brian [name] MRN: [id number] Date of Birth: 1941/04/08   Medicare Important Message Given:  Yes     [name] A Taahir Grisby 07/18/2021, 11:26 AM

## 2021-07-21 LAB — CULTURE, BLOOD (ROUTINE X 2)
Culture: NO GROWTH
Culture: NO GROWTH
Special Requests: ADEQUATE
Special Requests: ADEQUATE

## 2021-07-25 ENCOUNTER — Ambulatory Visit: Payer: Medicare Other | Admitting: Urology

## 2021-07-30 ENCOUNTER — Encounter: Payer: Self-pay | Admitting: Urology

## 2021-08-01 ENCOUNTER — Other Ambulatory Visit (HOSPITAL_COMMUNITY): Payer: Self-pay

## 2021-08-01 ENCOUNTER — Telehealth: Payer: Self-pay | Admitting: Cardiology

## 2021-08-01 NOTE — Telephone Encounter (Signed)
Pt c/o BP issue: STAT if pt c/o blurred vision, one-sided weakness or slurred speech  1. What are your last 5 BP readings?  CURRENTLY 134/100 HR 48  2. Are you having any other symptoms (ex. Dizziness, headache, blurred vision, passed out)? NO  3. What is your BP issue? BACK PAIN ON RIGHT SIDE WHICH RADIATED AROUND TO THE FRONT CHEST AREA  114/72 HR 78 150/90 HR 76 120/60 HR 64   PT IS SPANISH SPEAKING CHERYL STATES THAT PATIENTS PACEMAKER NEEDS TO BE CHECKED

## 2021-08-01 NOTE — Telephone Encounter (Signed)
Spoke with Elnita Maxwell, [facility] Nurse to follow up on her concern for patient's HR of 48. Verbally provided [facility] with Dr. Lovena Neighbours last in-clinic note with patient including programming changes. Also informed Elnita Maxwell that Dr. Corky Downs, [facility], remote monitored his own patient's devices and she would need to contact for any interrogation findings. Elnita Maxwell appreciative of follow up and collaboration of care. Below copied and pasted from Dr. Lalla Brothers note 05/23/21:  PLAN:     In order of problems listed above:   1. 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  The right ventricular lead is within the middle cardiac vein.  The right ventricular lead does not reliably function and intermittently captures the atrial tissue.  We discussed the options for managing this moving forward.  Option 1 would be to extract the right ventricular lead and implant a new right ventricular lead.  I do not think this is a good option given the risk associated with extracting a passive fixation lead within the coronary sinus.  Option 2 would be to abandon the current RV lead and implant a new right ventricular lead.  I do not think this is absolutely necessary given the patient rarely uses the ventricular lead.  Option 3 would be to reprogram the device to atrially pace and maximize battery longevity.  After a long discussion with the patient and his son, we decided on option 3.  I reprogrammed his device and lowered the base rate to 40 bpm in an effort to minimize pacing and maximize battery output.  Would plan on attempting RV lead addition at the time of generator replacement when we get to that point.   3. Failure of pacemaker lead, initial encounter See #1       Follow-up 1 year or sooner as needed.

## 2021-08-01 NOTE — Telephone Encounter (Signed)
Spoke with Brian [name], [facility], who saw pt earlier today with BP of 134/100 and HR 48.  She states pt denied dizziness, HA or dizziness/fainting.  He continues to complain of right sided back pain with radiation to his chest.  He was admitted to the hospital 9/16 with a negative cardiac work-up.  She reports pt is not in any acute distress.  Brian [name] is concerned that pt's pacemaker is allowing his HR to remain in the 40's and pt has not been notified of any alerts.  She is also concerned that pt takes Metoprolol and has low HR.  Pt has not  been scheduled for follow up with PCP.  He does see Infectious disease clinic. Will forward to device clinic for assistance with PPM transmission.  Brian [name] she will need to contact PCP re: BP as pt does not have a general cardiologist.  Brian [name] understanding and is agreeable with current plan.

## 2021-08-03 ENCOUNTER — Other Ambulatory Visit (HOSPITAL_COMMUNITY): Payer: Self-pay

## 2021-08-31 ENCOUNTER — Other Ambulatory Visit (HOSPITAL_COMMUNITY): Payer: Self-pay

## 2021-09-03 ENCOUNTER — Other Ambulatory Visit (HOSPITAL_COMMUNITY): Payer: Self-pay

## 2021-09-03 ENCOUNTER — Other Ambulatory Visit: Payer: Self-pay | Admitting: Infectious Diseases

## 2021-09-03 DIAGNOSIS — B20 Human immunodeficiency virus [HIV] disease: Secondary | ICD-10-CM

## 2021-09-03 MED ORDER — ABACAVIR-DOLUTEGRAVIR-LAMIVUD 600-50-300 MG PO TABS
1.0000 | ORAL_TABLET | Freq: Every day | ORAL | 5 refills | Status: DC
  Filled 2021-09-03: qty 30, 30d supply, fill #0
  Filled 2021-09-27: qty 30, 30d supply, fill #1
  Filled 2021-10-25: qty 30, 30d supply, fill #2
  Filled 2021-11-29: qty 30, 30d supply, fill #3
  Filled 2021-12-25: qty 30, 30d supply, fill #4
  Filled 2022-01-25: qty 30, 30d supply, fill #5

## 2021-09-03 NOTE — Progress Notes (Unsigned)
Refills sent

## 2021-09-04 ENCOUNTER — Other Ambulatory Visit (HOSPITAL_COMMUNITY): Payer: Self-pay

## 2021-09-05 ENCOUNTER — Other Ambulatory Visit (HOSPITAL_COMMUNITY): Payer: Self-pay

## 2021-09-06 ENCOUNTER — Other Ambulatory Visit (HOSPITAL_COMMUNITY): Payer: Self-pay

## 2021-09-10 ENCOUNTER — Other Ambulatory Visit (HOSPITAL_COMMUNITY): Payer: Self-pay

## 2021-09-27 ENCOUNTER — Other Ambulatory Visit (HOSPITAL_COMMUNITY): Payer: Self-pay

## 2021-09-28 ENCOUNTER — Other Ambulatory Visit (HOSPITAL_COMMUNITY): Payer: Self-pay

## 2021-10-01 ENCOUNTER — Other Ambulatory Visit (HOSPITAL_COMMUNITY): Payer: Self-pay

## 2021-10-25 ENCOUNTER — Other Ambulatory Visit (HOSPITAL_COMMUNITY): Payer: Self-pay

## 2021-11-01 ENCOUNTER — Other Ambulatory Visit (HOSPITAL_COMMUNITY): Payer: Self-pay

## 2021-11-29 ENCOUNTER — Other Ambulatory Visit (HOSPITAL_COMMUNITY): Payer: Self-pay

## 2021-12-03 ENCOUNTER — Other Ambulatory Visit: Payer: Self-pay

## 2021-12-03 ENCOUNTER — Encounter: Payer: Self-pay | Admitting: Internal Medicine

## 2021-12-03 ENCOUNTER — Other Ambulatory Visit (HOSPITAL_COMMUNITY): Payer: Self-pay

## 2021-12-03 ENCOUNTER — Ambulatory Visit (INDEPENDENT_AMBULATORY_CARE_PROVIDER_SITE_OTHER): Payer: Medicare Other | Admitting: Internal Medicine

## 2021-12-03 VITALS — BP 136/90 | HR 102 | Ht 66.0 in | Wt 160.0 lb

## 2021-12-03 DIAGNOSIS — Z950 Presence of cardiac pacemaker: Secondary | ICD-10-CM | POA: Diagnosis not present

## 2021-12-03 DIAGNOSIS — B20 Human immunodeficiency virus [HIV] disease: Secondary | ICD-10-CM | POA: Diagnosis not present

## 2021-12-03 DIAGNOSIS — I10 Essential (primary) hypertension: Secondary | ICD-10-CM | POA: Diagnosis not present

## 2021-12-03 DIAGNOSIS — N401 Enlarged prostate with lower urinary tract symptoms: Secondary | ICD-10-CM | POA: Diagnosis not present

## 2021-12-03 DIAGNOSIS — G4734 Idiopathic sleep related nonobstructive alveolar hypoventilation: Secondary | ICD-10-CM | POA: Diagnosis not present

## 2021-12-03 MED ORDER — METOPROLOL SUCCINATE ER 25 MG PO TB24
25.0000 mg | ORAL_TABLET | Freq: Two times a day (BID) | ORAL | 3 refills | Status: DC
  Filled 2021-12-03: qty 60, 30d supply, fill #0

## 2021-12-03 MED ORDER — METOPROLOL SUCCINATE ER 25 MG PO TB24: 25.0000 mg | ORAL_TABLET | Freq: Two times a day (BID) | ORAL | 3 refills | Status: DC

## 2021-12-03 NOTE — Assessment & Plan Note (Addendum)
Patient not clear whether he is taking HIV medication  .

## 2021-12-03 NOTE — Assessment & Plan Note (Signed)

## 2021-12-03 NOTE — Assessment & Plan Note (Signed)
Patient will use CPAP machine

## 2021-12-03 NOTE — Progress Notes (Signed)
Established Patient Office Visit  Subjective:  Patient ID: Brian [name], male    DOB: 03-25-1941  Age: 81 y.o. MRN: [id number]  CC:  Chief Complaint  Patient presents with   Pacemaker Check    HPI  Brian [name] presents forpacer check  Past Medical History:  Diagnosis Date   Arthritis    Asthma    Bradycardia    Depression    HIV (human immunodeficiency virus infection) (HCC)    Hyperlipidemia     Past Surgical History:  Procedure Laterality Date   cataracts     EYE SURGERY     PACEMAKER INSERTION N/A 08/02/2015   Procedure: INSERTION PACEMAKER;  Surgeon: Corky Downs, MD;  Location: [name];  Service: Cardiovascular;  Laterality: N/A;    Family History  Problem Relation Age of Onset   Hypertension Other     Social History   Socioeconomic History   Marital status: Married    Spouse name: Not on file   Number of children: Not on file   Years of education: Not on file   Highest education level: Not on file  Occupational History   Not on file  Tobacco Use   Smoking status: Never   Smokeless tobacco: Never  Vaping Use   Vaping Use: Never used  Substance and Sexual Activity   Alcohol use: Yes    Alcohol/week: 2.0 standard drinks    Types: 2 Cans of beer per week    Comment: 1-2 drinks a day   Drug use: No   Sexual activity: Not on file  Other Topics Concern   Not on file  Social History Narrative   Not on file   Social Determinants of Health   Financial Resource Strain: Not on file  Food Insecurity: Not on file  Transportation Needs: Not on file  Physical Activity: Not on file  Stress: Not on file  Social Connections: Not on file  Intimate Partner Violence: Not on file     Current Outpatient Medications:    abacavir-dolutegravir-lamiVUDine (TRIUMEQ) 600-50-300 MG tablet, Take 1 tablet by mouth daily., Disp: 30 tablet, Rfl: 5   alendronate (FOSAMAX) 70 MG tablet, Take 70 mg by mouth once a week. Take on Saturday., Disp: , Rfl: 5    atorvastatin (LIPITOR) 40 MG tablet, Take 40 mg by mouth daily., Disp: , Rfl:    azelastine (OPTIVAR) 0.05 % ophthalmic solution, Apply 1 drop to eye 2 (two) times daily., Disp: , Rfl:    clonazePAM (KLONOPIN) 0.5 MG tablet, Take 0.5 mg by mouth 2 (two) times daily as needed for anxiety., Disp: , Rfl:    DULoxetine (CYMBALTA) 30 MG capsule, Take 30 mg by mouth daily., Disp: , Rfl:    dutasteride (AVODART) 0.5 MG capsule, Take 1 capsule (0.5 mg total) by mouth daily., Disp: 30 capsule, Rfl: 11   loratadine (CLARITIN) 10 MG tablet, Take 10 mg by mouth daily., Disp: , Rfl:    nystatin-triamcinolone ointment (MYCOLOG), Apply topically 2 (two) times daily., Disp: 30 g, Rfl: 0   omeprazole (PRILOSEC) 20 MG capsule, Take 20 mg by mouth daily., Disp: , Rfl: 5   tamsulosin (FLOMAX) 0.4 MG CAPS capsule, Take 1 capsule (0.4 mg total) by mouth daily., Disp: 30 capsule, Rfl: 11   traMADol-acetaminophen (ULTRACET) 37.5-325 MG tablet, Take 1 tablet by mouth daily as needed., Disp: , Rfl:    triamcinolone cream (KENALOG) 0.1 %, Apply 1 application topically 2 (two) times daily., Disp: , Rfl:    VENTOLIN HFA  108 (90 BASE) MCG/ACT inhaler, Inhale 2 puffs into the lungs 4 (four) times daily as needed. For shortness of breath and/or wheezing., Disp: , Rfl: 5   metoprolol succinate (TOPROL-XL) 25 MG 24 hr tablet, Take 1 tablet (25 mg total) by mouth in the morning and at bedtime., Disp: 60 tablet, Rfl: 3   No Known Allergies  ROS Review of Systems  Constitutional: Negative.   HENT: Negative.    Eyes: Negative.   Respiratory: Negative.  Negative for cough and chest tightness.   Cardiovascular: Negative.  Negative for chest pain and leg swelling.  Gastrointestinal: Negative.   Endocrine: Negative.   Genitourinary: Negative.  Negative for dysuria and hematuria.  Musculoskeletal: Negative.   Skin: Negative.   Allergic/Immunologic: Negative.   Neurological: Negative.  Negative for seizures.  Hematological:  Negative.   Psychiatric/Behavioral: Negative.  Negative for dysphoric mood.   All other systems reviewed and are negative.    Objective:    Physical Exam Constitutional:      Appearance: He is normal weight.  HENT:     Head: Normocephalic.     Mouth/Throat:     Mouth: Mucous membranes are moist.  Cardiovascular:     Rate and Rhythm: Regular rhythm.  Abdominal:     General: Bowel sounds are normal.  Musculoskeletal:        General: Normal range of motion.     Cervical back: Normal range of motion.    BP 136/90    Pulse (!) 102    Ht 5\' 6"  (1.676 m)    Wt 160 lb (72.6 kg)    BMI 25.82 kg/m  Wt Readings from Last 3 Encounters:  12/03/21 160 lb (72.6 kg)  07/13/21 160 lb 15 oz (73 kg)  06/14/21 161 lb (73 kg)     Health Maintenance Due  Topic Date Due   COVID-19 Vaccine (1) Never done   Zoster Vaccines- Shingrix (1 of 2) Never done   INFLUENZA VACCINE  05/28/2021    There are no preventive care reminders to display for this patient.  Lab Results  Component Value Date   TSH 0.532 10/16/2014   Lab Results  Component Value Date   WBC 7.2 07/18/2021   HGB 14.5 07/18/2021   HCT 39.7 07/18/2021   MCV 88.8 07/18/2021   PLT 249 07/18/2021   Lab Results  Component Value Date   NA 137 07/18/2021   K 3.5 07/18/2021   CO2 26 07/18/2021   GLUCOSE 105 (H) 07/18/2021   BUN 13 07/18/2021   CREATININE 0.72 07/18/2021   BILITOT 0.9 07/18/2021   ALKPHOS 105 07/18/2021   AST 48 (H) 07/18/2021   ALT 30 07/18/2021   PROT 7.6 07/18/2021   ALBUMIN 3.2 (L) 07/18/2021   CALCIUM 8.6 (L) 07/18/2021   ANIONGAP 11 07/18/2021   Lab Results  Component Value Date   CHOL 208 (H) 07/14/2021   Lab Results  Component Value Date   HDL 54 07/14/2021   Lab Results  Component Value Date   LDLCALC 132 (H) 07/14/2021   Lab Results  Component Value Date   TRIG 110 07/14/2021   Lab Results  Component Value Date   CHOLHDL 3.9 07/14/2021   No results found for: HGBA1C     Assessment & Plan:   Problem List Items Addressed This Visit       Cardiovascular and Mediastinum   Essential hypertension     Patient denies any chest pain or shortness of breath there is no  history of palpitation or paroxysmal nocturnal dyspnea   patient was advised to follow low-salt low-cholesterol diet    ideally I want to keep systolic blood pressure below 130 mmHg, patient was asked to check blood pressure one times a week and give me a report on that.  Patient will be follow-up in 3 months  or earlier as needed, patient will call me back for any change in the cardiovascular symptoms Patient was advised to buy a book from local bookstore concerning blood pressure and read several chapters  every day.  This will be supplemented by some of the material we will give him from the office.  Patient should also utilize other resources like YouTube and Internet to learn more about the blood pressure and the diet.      Relevant Medications   metoprolol succinate (TOPROL-XL) 25 MG 24 hr tablet     Respiratory   Sleep apnea (Chronic)    Patient will use CPAP machine        Genitourinary   BPH (benign prostatic hyperplasia) (Chronic)    Patient has 1 episode of hematuria, he has seen the urologist in the past        Other   HIV (human immunodeficiency virus infection) ([place]) (Chronic)    Patient not clear whether he is taking HIV medication  .       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 daily      Relevant Medications   metoprolol succinate (TOPROL-XL) 25 MG 24 hr tablet   Other Visit Diagnoses     Cardiac pacemaker in situ    -  Primary   Relevant Orders   PACEMAKER IN CLINIC CHECK       Meds ordered this encounter  Medications   DISCONTD: metoprolol succinate (TOPROL-XL) 25 MG 24 hr tablet    Sig: Take 1 tablet by mouth in the morning and  at bedtime.    Dispense:  60 tablet    Refill:  3   metoprolol succinate (TOPROL-XL) 25 MG 24 hr tablet    Sig: Take 1 tablet (25 mg total) by mouth in the morning and at bedtime.    Dispense:  60 tablet    Refill:  3  Patient is not dependent on the pacemaker, battery life iis 5 yrs  or more Patient is noncompliant to his medications as well as follow-up, he has not seen the urologist neither HIV specialist  Follow-up: No follow-ups on file.    Cletis Athens, MD

## 2021-12-03 NOTE — Assessment & Plan Note (Signed)
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 daily

## 2021-12-03 NOTE — Assessment & Plan Note (Signed)
Patient has 1 episode of hematuria, he has seen the urologist in the past

## 2021-12-13 ENCOUNTER — Other Ambulatory Visit: Payer: Self-pay | Admitting: Urology

## 2021-12-18 ENCOUNTER — Other Ambulatory Visit
Admission: RE | Admit: 2021-12-18 | Discharge: 2021-12-18 | Disposition: A | Discharge status: Home / Self Care | Payer: Medicare Other | Attending: Infectious Diseases | Admitting: Infectious Diseases

## 2021-12-18 ENCOUNTER — Encounter: Payer: Self-pay | Admitting: Infectious Diseases

## 2021-12-18 ENCOUNTER — Other Ambulatory Visit: Payer: Self-pay

## 2021-12-18 ENCOUNTER — Ambulatory Visit: Payer: Medicare Other | Attending: Infectious Diseases | Admitting: Infectious Diseases

## 2021-12-18 VITALS — BP 139/79 | HR 85 | Temp 98.2°F | Wt 164.0 lb

## 2021-12-18 DIAGNOSIS — B20 Human immunodeficiency virus [HIV] disease: Secondary | ICD-10-CM

## 2021-12-18 DIAGNOSIS — Z09 Encounter for follow-up examination after completed treatment for conditions other than malignant neoplasm: Secondary | ICD-10-CM | POA: Diagnosis not present

## 2021-12-18 DIAGNOSIS — I10 Essential (primary) hypertension: Secondary | ICD-10-CM | POA: Diagnosis not present

## 2021-12-18 DIAGNOSIS — Z79899 Other long term (current) drug therapy: Secondary | ICD-10-CM | POA: Diagnosis not present

## 2021-12-18 DIAGNOSIS — Z950 Presence of cardiac pacemaker: Secondary | ICD-10-CM | POA: Insufficient documentation

## 2021-12-18 DIAGNOSIS — Z8616 Personal history of COVID-19: Secondary | ICD-10-CM | POA: Insufficient documentation

## 2021-12-18 DIAGNOSIS — E785 Hyperlipidemia, unspecified: Secondary | ICD-10-CM

## 2021-12-18 LAB — CBC WITH DIFFERENTIAL/PLATELET
Abs Immature Granulocytes: 0.04 10*3/uL (ref 0.00–0.07)
Basophils Absolute: 0 10*3/uL (ref 0.0–0.1)
Basophils Relative: 1 %
Eosinophils Absolute: 0.1 10*3/uL (ref 0.0–0.5)
Eosinophils Relative: 1 %
HCT: 43.7 % (ref 39.0–52.0)
Hemoglobin: 14.8 g/dL (ref 13.0–17.0)
Immature Granulocytes: 1 %
Lymphocytes Relative: 21 %
Lymphs Abs: 1.6 10*3/uL (ref 0.7–4.0)
MCH: 29.1 pg (ref 26.0–34.0)
MCHC: 33.9 g/dL (ref 30.0–36.0)
MCV: 85.9 fL (ref 80.0–100.0)
Monocytes Absolute: 0.6 10*3/uL (ref 0.1–1.0)
Monocytes Relative: 8 %
Neutro Abs: 5.1 10*3/uL (ref 1.7–7.7)
Neutrophils Relative %: 68 %
Platelets: 234 10*3/uL (ref 150–400)
RBC: 5.09 MIL/uL (ref 4.22–5.81)
RDW: 13.3 % (ref 11.5–15.5)
WBC: 7.4 10*3/uL (ref 4.0–10.5)
nRBC: 0 % (ref 0.0–0.2)

## 2021-12-18 LAB — COMPREHENSIVE METABOLIC PANEL
ALT: 19 U/L (ref 0–44)
AST: 28 U/L (ref 15–41)
Albumin: 4.1 g/dL (ref 3.5–5.0)
Alkaline Phosphatase: 42 U/L (ref 38–126)
Anion gap: 11 (ref 5–15)
BUN: 15 mg/dL (ref 8–23)
CO2: 26 mmol/L (ref 22–32)
Calcium: 9.3 mg/dL (ref 8.9–10.3)
Chloride: 101 mmol/L (ref 98–111)
Creatinine, Ser: 1.08 mg/dL (ref 0.61–1.24)
GFR, Estimated: >60 mL/min (ref >60)
Glucose, Bld: 114 mg/dL — ABNORMAL HIGH (ref 70–99)
Potassium: 3.8 mmol/L (ref 3.5–5.1)
Sodium: 138 mmol/L (ref 135–145)
Total Bilirubin: 0.8 mg/dL (ref 0.3–1.2)
Total Protein: 7.7 g/dL (ref 6.5–8.1)

## 2021-12-18 NOTE — Patient Instructions (Addendum)
You are here for follow up of HIV- you are doing well on triumeq- today will do labs. Followup of 6 months

## 2021-12-18 NOTE — Progress Notes (Signed)
NAME: Brian [name]  DOB: 05-Nov-1940  MRN: [id number]  Date/Time: 12/18/2021 11:34 AM Subjective:  Patient here for follow-up of HIV   Brian [name] is a 81 y.o. 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 [place] interpreter was in the visit. Pt is doing well 100% adherent to HAARt- takes triumeq No complaints Since Last  time I saw him in Aug 2022  Pt was in [facility] 9/16-9/21/2022 for substernal chest pain and ruled out cardiac issue- also got treated for rt lower extremity cellulitis He had Covid infection and had post COVID respiratory changes needing oxygen at home.  Today he says he is doing much better.  He has been off and oxygen for quite a few months now. He is 100% adherent to HAART His last Vl was 40 and cd4 was 388 Says Triumeq is being delivered from [place] long.  And it is more regular now.  HIV history Diagnosed in 2014 when he found out that his wife in [name] had died of AIDS. He is originally from there but has been in [place] for the past 31 years HIV diagnosed 2014 1st visit to [facility] 11/25/13 Nadir cd4 was 120 (5%) on 10/29/2013 VL 273,240 from 10/29/2013  HAARt history 1st regimen was complera 2nd regimen triumeq As per Dr.Fitzgerald's note on 08/06/16  Recommendations HIV -he has had some issues with compliance with Complera in the past. His viral load has been slightly elevated now for several checks.  I would like to consider switching him to another regimen potentially more potent given the concern for developing resistance. He had pan sensitive virus on genotype in 2015. WIll check HLA [id number] and if neg start triumeq   Telephone Encounter - [name], MD - 08/29/2016 11:35 AM EDT please let pt know that his testing we did to change his medicine to a stronger one was good  I have sent in a new medicine triumeq to take in place of the Complera. He should stop the  complera once he gets the triumeq. He should call if any new problems - otherwise I will see him in Jan    Electronically signed by [name], MD at 08/29/2016 11:39 AM EDT    Acquired thru- heterosexual contact Genotype highly sensitive organism  ? Past Medical History:  Diagnosis Date   Arthritis    Asthma    Bradycardia    Depression    HIV (human immunodeficiency virus infection) ([place])    Hyperlipidemia   Osteoporosis   Past Surgical History:  Procedure Laterality Date   cataracts     EYE SURGERY     PACEMAKER INSERTION N/A 08/02/2015   Procedure: INSERTION PACEMAKER;  Surgeon: Cletis Athens, MD;  Location: [name];  Service: Cardiovascular;  Laterality: N/A;  prosthesis for urinary flow   SH Lives on his own 6 children and many grandchildren Non smoker No alcohol or illicit drug use   Family History  Problem Relation Age of Onset   Hypertension Other    No Known Allergies      REVIEW OF SYSTEMS:  Const: negative fever, negative chills, negative weight loss Eyes: negative diplopia or visual changes, negative eye pain ENT: negative coryza, negative sore throat Resp: negative cough, hemoptysis, no dyspnea on exertion.  3 cards: negative for chest pain, palpitations, lower extremity edema GU: negative for frequency, dysuria and hematuria Skin: negative for rash and pruritus Heme: negative for easy bruising  and gum/nose bleeding MS: negative for myalgias, arthralgias, back pain and muscle weakness Neurolo: no headache or dizziness Psych: negative for feelings of anxiety, depression   Objective:  BP 139/79    Pulse 85    Temp 98.2 F (36.8 C) (Temporal)    Wt 164 lb (74.4 kg)    SpO2 93%    BMI 26.47 kg/m    PHYSICAL EXAM:  General: Well. Head: Normocephalic, without obvious abnormality, atraumatic. Eyes: Conjunctivae clear, anicteric sclerae. Pupils are equal Nose: Nares normal. No drainage or sinus tenderness. Throat: Lips, mucosa, and  tongue normal. No Thrush Neck: Supple, symmetrical, no adenopathy, thyroid: non tender no carotid bruit and no JVD. Back: No CVA tenderness. Lungs: Bilateral air entry. Heart: Regular rate and rhythm, no murmur, rub or gallop.pacemaker site fine Abdomen: Soft, non-tender,not distended. Bowel sounds normal. No masses Skin: No rashes or lesions. Not Jaundiced Lymph: Cervical, supraclavicular normal. Neurologic: Grossly non-focal   Health maintenance Vaccination   Vaccine Date last given comment  Influenza    Hepatitis B    Hepatitis A    Prevnar-PCV-13 12/01/18   Pneumovac-PPSV-23 2016   TdaP 12/01/18   HPV    Shingrix ( zoster vaccine)     ______________________  Labs Lab Result  Date comment  HIV VL 330 02/03/20   CD4 259 (25%) 02/03/20   Genotype Sensitive virus 10/29/2013 Genosure prime  [id number] NEgative 08/06/16   HIV antibody     RPR NR 02/03/20   Quantiferon Gold neg 02/03/20   Hep C ab NR 02/03/20   Hepatitis B-ab,ag,c     Hepatitis A-IgM, IgG /T     Lipid     GC/CHL          HB,PLT,Cr, LFT 14.5/218/0.99 06/01/19     Preventive  Procedure Result  Date comment  colonoscopy   none       Dental exam   Not recently  Opthal   Few months ago     Impression/Recommendation ?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  We will do labs today  Recovered Covid infection HTN- controlled pacemaker for symptomatic bradycardia recent   Hematuria- one episode-was followed by Dr.Sninsky-he refused cystoscopy- he has no further hematuria Hyperlipidemia on atorvastatin  Prescriptions is now from [place] We will follow patient in 6 months. We will do labs today. Discussed the management with the patient through interpreter.

## 2021-12-19 LAB — RPR: RPR Ser Ql: NONREACTIVE

## 2021-12-19 LAB — T-HELPER CELLS CD4/CD8 %
% CD 4 Pos. Lymph.: 25.1 % — ABNORMAL LOW (ref 30.8–58.5)
Absolute CD 4 Helper: 402 /uL (ref 359–1519)
Basophils Absolute: 0 10*3/uL (ref 0.0–0.2)
Basos: 1 %
CD3+CD4+ Cells/CD3+CD8+ Cells Bld: 0.63 — ABNORMAL LOW (ref 0.92–3.72)
CD3+CD8+ Cells # Bld: 635 /uL (ref 109–897)
CD3+CD8+ Cells NFr Bld: 39.7 % — ABNORMAL HIGH (ref 12.0–35.5)
EOS (ABSOLUTE): 0.1 10*3/uL (ref 0.0–0.4)
Eos: 1 %
Hematocrit: 45.3 % (ref 37.5–51.0)
Hemoglobin: 15.1 g/dL (ref 13.0–17.7)
Immature Grans (Abs): 0 10*3/uL (ref 0.0–0.1)
Immature Granulocytes: 0 %
Lymphocytes Absolute: 1.6 10*3/uL (ref 0.7–3.1)
Lymphs: 21 %
MCH: 29.5 pg (ref 26.6–33.0)
MCHC: 33.3 g/dL (ref 31.5–35.7)
MCV: 89 fL (ref 79–97)
Monocytes Absolute: 0.6 10*3/uL (ref 0.1–0.9)
Monocytes: 8 %
Neutrophils Absolute: 5 10*3/uL (ref 1.4–7.0)
Neutrophils: 69 %
Platelets: 233 10*3/uL (ref 150–450)
RBC: 5.11 x10E6/uL (ref 4.14–5.80)
RDW: 14.1 % (ref 11.6–15.4)
WBC: 7.3 10*3/uL (ref 3.4–10.8)

## 2021-12-19 LAB — HIV-1 RNA QUANT-NO REFLEX-BLD
HIV 1 RNA Quant: 20 copies/mL
LOG10 HIV-1 RNA: 1.301 log10copy/mL

## 2021-12-20 LAB — QUANTIFERON-TB GOLD PLUS (RQFGPL)
QuantiFERON Mitogen Value: >10 IU/mL
QuantiFERON Nil Value: 0.01 IU/mL
QuantiFERON TB1 Ag Value: 0.02 IU/mL
QuantiFERON TB2 Ag Value: 0.04 IU/mL

## 2021-12-20 LAB — QUANTIFERON-TB GOLD PLUS: QuantiFERON-TB Gold Plus: NEGATIVE

## 2021-12-25 ENCOUNTER — Other Ambulatory Visit (HOSPITAL_COMMUNITY): Payer: Self-pay

## 2021-12-28 ENCOUNTER — Other Ambulatory Visit (HOSPITAL_COMMUNITY): Payer: Self-pay

## 2022-01-12 LAB — MISC LABCORP TEST (SEND OUT): Labcorp test code: 551776

## 2022-01-25 ENCOUNTER — Other Ambulatory Visit (HOSPITAL_COMMUNITY): Payer: Self-pay

## 2022-01-28 ENCOUNTER — Other Ambulatory Visit (HOSPITAL_COMMUNITY): Payer: Self-pay

## 2022-02-19 ENCOUNTER — Other Ambulatory Visit (HOSPITAL_COMMUNITY): Payer: Self-pay

## 2022-02-19 ENCOUNTER — Other Ambulatory Visit: Payer: Self-pay | Admitting: Infectious Diseases

## 2022-02-19 DIAGNOSIS — B20 Human immunodeficiency virus [HIV] disease: Secondary | ICD-10-CM

## 2022-02-19 MED ORDER — TRIUMEQ 600-50-300 MG PO TABS
1.0000 | ORAL_TABLET | Freq: Every day | ORAL | 5 refills | Status: DC
  Filled 2022-02-19: qty 30, 30d supply, fill #0
  Filled 2022-03-22: qty 30, 30d supply, fill #1
  Filled 2022-04-17: qty 30, 30d supply, fill #2
  Filled 2022-05-20: qty 30, 30d supply, fill #3
  Filled 2022-06-17: qty 30, 30d supply, fill #4
  Filled 2022-07-11: qty 30, 30d supply, fill #5

## 2022-02-25 ENCOUNTER — Other Ambulatory Visit (HOSPITAL_COMMUNITY): Payer: Self-pay

## 2022-03-08 ENCOUNTER — Other Ambulatory Visit: Payer: Self-pay | Admitting: Internal Medicine

## 2022-03-08 DIAGNOSIS — Z950 Presence of cardiac pacemaker: Secondary | ICD-10-CM

## 2022-03-22 ENCOUNTER — Other Ambulatory Visit (HOSPITAL_COMMUNITY): Payer: Self-pay

## 2022-03-26 ENCOUNTER — Other Ambulatory Visit (HOSPITAL_COMMUNITY): Payer: Self-pay

## 2022-04-17 ENCOUNTER — Other Ambulatory Visit (HOSPITAL_COMMUNITY): Payer: Self-pay

## 2022-04-24 ENCOUNTER — Other Ambulatory Visit (HOSPITAL_COMMUNITY): Payer: Self-pay

## 2022-04-25 ENCOUNTER — Inpatient Hospital Stay
Admission: EM | Admit: 2022-04-25 | Discharge: 2022-05-04 | DRG: 065 | Disposition: A | Discharge status: Skilled Nursing Facility | Payer: Medicare Other | Attending: Internal Medicine | Admitting: Internal Medicine

## 2022-04-25 ENCOUNTER — Observation Stay (HOSPITAL_COMMUNITY)
Admit: 2022-04-25 | Discharge: 2022-04-25 | Disposition: A | Discharge status: Home / Self Care | Payer: Medicare Other | Attending: Internal Medicine | Admitting: Internal Medicine

## 2022-04-25 ENCOUNTER — Emergency Department: Payer: Medicare Other

## 2022-04-25 ENCOUNTER — Other Ambulatory Visit: Payer: Self-pay

## 2022-04-25 DIAGNOSIS — G8929 Other chronic pain: Secondary | ICD-10-CM | POA: Diagnosis present

## 2022-04-25 DIAGNOSIS — M1990 Unspecified osteoarthritis, unspecified site: Secondary | ICD-10-CM | POA: Diagnosis present

## 2022-04-25 DIAGNOSIS — R2681 Unsteadiness on feet: Secondary | ICD-10-CM | POA: Diagnosis not present

## 2022-04-25 DIAGNOSIS — I639 Cerebral infarction, unspecified: Secondary | ICD-10-CM | POA: Diagnosis present

## 2022-04-25 DIAGNOSIS — G4733 Obstructive sleep apnea (adult) (pediatric): Secondary | ICD-10-CM | POA: Diagnosis present

## 2022-04-25 DIAGNOSIS — R310 Gross hematuria: Secondary | ICD-10-CM | POA: Diagnosis not present

## 2022-04-25 DIAGNOSIS — F418 Other specified anxiety disorders: Secondary | ICD-10-CM | POA: Diagnosis present

## 2022-04-25 DIAGNOSIS — J449 Chronic obstructive pulmonary disease, unspecified: Secondary | ICD-10-CM | POA: Diagnosis present

## 2022-04-25 DIAGNOSIS — H1031 Unspecified acute conjunctivitis, right eye: Secondary | ICD-10-CM | POA: Diagnosis not present

## 2022-04-25 DIAGNOSIS — I6389 Other cerebral infarction: Secondary | ICD-10-CM

## 2022-04-25 DIAGNOSIS — I10 Essential (primary) hypertension: Secondary | ICD-10-CM | POA: Diagnosis present

## 2022-04-25 DIAGNOSIS — I719 Aortic aneurysm of unspecified site, without rupture: Secondary | ICD-10-CM | POA: Diagnosis not present

## 2022-04-25 DIAGNOSIS — Z9981 Dependence on supplemental oxygen: Secondary | ICD-10-CM

## 2022-04-25 DIAGNOSIS — F32A Depression, unspecified: Secondary | ICD-10-CM | POA: Diagnosis present

## 2022-04-25 DIAGNOSIS — B20 Human immunodeficiency virus [HIV] disease: Secondary | ICD-10-CM | POA: Diagnosis present

## 2022-04-25 DIAGNOSIS — E785 Hyperlipidemia, unspecified: Secondary | ICD-10-CM | POA: Diagnosis present

## 2022-04-25 DIAGNOSIS — Z7983 Long term (current) use of bisphosphonates: Secondary | ICD-10-CM | POA: Diagnosis not present

## 2022-04-25 DIAGNOSIS — I700 Atherosclerosis of aorta: Secondary | ICD-10-CM | POA: Diagnosis present

## 2022-04-25 DIAGNOSIS — H109 Unspecified conjunctivitis: Secondary | ICD-10-CM | POA: Diagnosis not present

## 2022-04-25 DIAGNOSIS — Z66 Do not resuscitate: Secondary | ICD-10-CM | POA: Diagnosis present

## 2022-04-25 DIAGNOSIS — I6340 Cerebral infarction due to embolism of unspecified cerebral artery: Principal | ICD-10-CM | POA: Diagnosis present

## 2022-04-25 DIAGNOSIS — R29810 Facial weakness: Secondary | ICD-10-CM | POA: Diagnosis present

## 2022-04-25 DIAGNOSIS — R471 Dysarthria and anarthria: Secondary | ICD-10-CM | POA: Diagnosis present

## 2022-04-25 DIAGNOSIS — Z79899 Other long term (current) drug therapy: Secondary | ICD-10-CM

## 2022-04-25 DIAGNOSIS — G8194 Hemiplegia, unspecified affecting left nondominant side: Secondary | ICD-10-CM | POA: Diagnosis present

## 2022-04-25 DIAGNOSIS — Z21 Asymptomatic human immunodeficiency virus [HIV] infection status: Secondary | ICD-10-CM | POA: Diagnosis present

## 2022-04-25 DIAGNOSIS — F419 Anxiety disorder, unspecified: Secondary | ICD-10-CM | POA: Diagnosis present

## 2022-04-25 DIAGNOSIS — N400 Enlarged prostate without lower urinary tract symptoms: Secondary | ICD-10-CM | POA: Diagnosis present

## 2022-04-25 DIAGNOSIS — Z950 Presence of cardiac pacemaker: Secondary | ICD-10-CM

## 2022-04-25 DIAGNOSIS — I6312 Cerebral infarction due to embolism of basilar artery: Secondary | ICD-10-CM

## 2022-04-25 DIAGNOSIS — Z8673 Personal history of transient ischemic attack (TIA), and cerebral infarction without residual deficits: Secondary | ICD-10-CM | POA: Diagnosis present

## 2022-04-25 DIAGNOSIS — Z8249 Family history of ischemic heart disease and other diseases of the circulatory system: Secondary | ICD-10-CM | POA: Diagnosis not present

## 2022-04-25 DIAGNOSIS — R29706 NIHSS score 6: Secondary | ICD-10-CM | POA: Diagnosis present

## 2022-04-25 LAB — DIFFERENTIAL
Abs Immature Granulocytes: 0.03 10*3/uL (ref 0.00–0.07)
Basophils Absolute: 0 10*3/uL (ref 0.0–0.1)
Basophils Relative: 1 %
Eosinophils Absolute: 0.2 10*3/uL (ref 0.0–0.5)
Eosinophils Relative: 2 %
Immature Granulocytes: 1 %
Lymphocytes Relative: 16 %
Lymphs Abs: 1 10*3/uL (ref 0.7–4.0)
Monocytes Absolute: 0.5 10*3/uL (ref 0.1–1.0)
Monocytes Relative: 7 %
Neutro Abs: 4.8 10*3/uL (ref 1.7–7.7)
Neutrophils Relative %: 73 %

## 2022-04-25 LAB — COMPREHENSIVE METABOLIC PANEL
ALT: 21 U/L (ref 0–44)
AST: 28 U/L (ref 15–41)
Albumin: 3.5 g/dL (ref 3.5–5.0)
Alkaline Phosphatase: 42 U/L (ref 38–126)
Anion gap: 6 (ref 5–15)
BUN: 11 mg/dL (ref 8–23)
CO2: 22 mmol/L (ref 22–32)
Calcium: 8.7 mg/dL — ABNORMAL LOW (ref 8.9–10.3)
Chloride: 110 mmol/L (ref 98–111)
Creatinine, Ser: 0.92 mg/dL (ref 0.61–1.24)
GFR, Estimated: >60 mL/min (ref >60)
Glucose, Bld: 121 mg/dL — ABNORMAL HIGH (ref 70–99)
Potassium: 3.6 mmol/L (ref 3.5–5.1)
Sodium: 138 mmol/L (ref 135–145)
Total Bilirubin: 0.4 mg/dL (ref 0.3–1.2)
Total Protein: 7.2 g/dL (ref 6.5–8.1)

## 2022-04-25 LAB — CBC
HCT: 38.4 % — ABNORMAL LOW (ref 39.0–52.0)
Hemoglobin: 13.1 g/dL (ref 13.0–17.0)
MCH: 30.3 pg (ref 26.0–34.0)
MCHC: 34.1 g/dL (ref 30.0–36.0)
MCV: 88.9 fL (ref 80.0–100.0)
Platelets: 202 10*3/uL (ref 150–400)
RBC: 4.32 MIL/uL (ref 4.22–5.81)
RDW: 13 % (ref 11.5–15.5)
WBC: 6.5 10*3/uL (ref 4.0–10.5)
nRBC: 0 % (ref 0.0–0.2)

## 2022-04-25 LAB — ECHOCARDIOGRAM COMPLETE
AR max vel: 2.15 cm2
AV Area VTI: 2.36 cm2
AV Area mean vel: 2.1 cm2
AV Mean grad: 2.5 mmHg
AV Peak grad: 4.6 mmHg
Ao pk vel: 1.07 m/s
Area-P 1/2: 2.45 cm2
S' Lateral: 3 cm
Weight: 2680.79 oz

## 2022-04-25 LAB — HEMOGLOBIN A1C
Hgb A1c MFr Bld: 5.8 % — ABNORMAL HIGH (ref 4.8–5.6)
Mean Plasma Glucose: 120 mg/dL

## 2022-04-25 LAB — PROTIME-INR
INR: 1.1 (ref 0.8–1.2)
Prothrombin Time: 13.9 seconds (ref 11.4–15.2)

## 2022-04-25 LAB — ETHANOL: Alcohol, Ethyl (B): <10 mg/dL (ref <10)

## 2022-04-25 LAB — APTT: aPTT: 32 seconds (ref 24–36)

## 2022-04-25 LAB — CBG MONITORING, ED: Glucose-Capillary: 116 mg/dL — ABNORMAL HIGH (ref 70–99)

## 2022-04-25 LAB — TROPONIN I (HIGH SENSITIVITY): Troponin I (High Sensitivity): 6 ng/L (ref <18)

## 2022-04-25 MED ORDER — ACETAMINOPHEN 325 MG PO TABS: 325.0000 mg | ORAL_TABLET | Freq: Every day | ORAL | Status: DC | PRN

## 2022-04-25 MED ORDER — ATORVASTATIN CALCIUM 20 MG PO TABS
40.0000 mg | ORAL_TABLET | Freq: Every day | ORAL | Status: DC
  Administered 2022-04-25: 40 mg via ORAL
  Filled 2022-04-25: qty 2

## 2022-04-25 MED ORDER — TRAMADOL-ACETAMINOPHEN 37.5-325 MG PO TABS
1.0000 | ORAL_TABLET | Freq: Every day | ORAL | Status: DC | PRN
Start: 2022-04-25 — End: 2022-04-25

## 2022-04-25 MED ORDER — SODIUM CHLORIDE 0.9% FLUSH
3.0000 mL | Freq: Once | INTRAVENOUS | Status: AC
  Administered 2022-04-25: 3 mL via INTRAVENOUS

## 2022-04-25 MED ORDER — IOHEXOL 350 MG/ML SOLN
100.0000 mL | Freq: Once | INTRAVENOUS | Status: AC | PRN
  Administered 2022-04-25: 100 mL via INTRAVENOUS

## 2022-04-25 MED ORDER — ABACAVIR-DOLUTEGRAVIR-LAMIVUD 600-50-300 MG PO TABS
1.0000 | ORAL_TABLET | Freq: Every day | ORAL | Status: DC
  Administered 2022-04-26 – 2022-05-04 (×9): 1 via ORAL
  Filled 2022-04-25 (×9): qty 1

## 2022-04-25 MED ORDER — ASPIRIN 81 MG PO CHEW
324.0000 mg | CHEWABLE_TABLET | Freq: Every day | ORAL | Status: DC
  Administered 2022-04-26 – 2022-05-04 (×9): 324 mg via ORAL
  Filled 2022-04-25 (×9): qty 4

## 2022-04-25 MED ORDER — ONDANSETRON HCL 4 MG/2ML IJ SOLN: 4.0000 mg | Freq: Three times a day (TID) | INTRAMUSCULAR | Status: DC | PRN

## 2022-04-25 MED ORDER — ASPIRIN 81 MG PO CHEW
324.0000 mg | CHEWABLE_TABLET | Freq: Once | ORAL | Status: AC
  Administered 2022-04-25: 324 mg via ORAL
  Filled 2022-04-25: qty 4

## 2022-04-25 MED ORDER — CLONAZEPAM 0.5 MG PO TABS
0.5000 mg | ORAL_TABLET | Freq: Two times a day (BID) | ORAL | Status: DC | PRN
  Administered 2022-04-25 – 2022-04-27 (×3): 0.5 mg via ORAL
  Filled 2022-04-25 (×3): qty 1

## 2022-04-25 MED ORDER — ALBUTEROL SULFATE (2.5 MG/3ML) 0.083% IN NEBU: 2.5000 mg | INHALATION_SOLUTION | RESPIRATORY_TRACT | Status: DC | PRN

## 2022-04-25 MED ORDER — TAMSULOSIN HCL 0.4 MG PO CAPS
0.4000 mg | ORAL_CAPSULE | Freq: Every day | ORAL | Status: DC
  Administered 2022-04-25 – 2022-05-04 (×10): 0.4 mg via ORAL
  Filled 2022-04-25 (×10): qty 1

## 2022-04-25 MED ORDER — STROKE: EARLY STAGES OF RECOVERY BOOK
Freq: Once | Status: AC
Start: 2022-04-26 — End: 2022-04-26

## 2022-04-25 MED ORDER — ACETAMINOPHEN 325 MG PO TABS: 650.0000 mg | ORAL_TABLET | ORAL | Status: DC | PRN

## 2022-04-25 MED ORDER — DUTASTERIDE 0.5 MG PO CAPS
0.5000 mg | ORAL_CAPSULE | Freq: Every day | ORAL | Status: DC
  Administered 2022-04-25 – 2022-05-04 (×10): 0.5 mg via ORAL
  Filled 2022-04-25 (×10): qty 1

## 2022-04-25 MED ORDER — HYDRALAZINE HCL 20 MG/ML IJ SOLN: 5.0000 mg | INTRAMUSCULAR | Status: DC | PRN

## 2022-04-25 MED ORDER — ACETAMINOPHEN 650 MG RE SUPP: 650.0000 mg | RECTAL | Status: DC | PRN

## 2022-04-25 MED ORDER — SENNOSIDES-DOCUSATE SODIUM 8.6-50 MG PO TABS: 1.0000 | ORAL_TABLET | Freq: Every evening | ORAL | Status: DC | PRN

## 2022-04-25 MED ORDER — DM-GUAIFENESIN ER 30-600 MG PO TB12: 1.0000 | ORAL_TABLET | Freq: Two times a day (BID) | ORAL | Status: DC | PRN

## 2022-04-25 MED ORDER — TRAMADOL HCL 50 MG PO TABS
50.0000 mg | ORAL_TABLET | Freq: Every day | ORAL | Status: DC | PRN
  Administered 2022-04-25 – 2022-04-26 (×2): 50 mg via ORAL
  Filled 2022-04-25 (×2): qty 1

## 2022-04-25 MED ORDER — PREGABALIN 50 MG PO CAPS
50.0000 mg | ORAL_CAPSULE | Freq: Every day | ORAL | Status: DC
  Administered 2022-04-25 – 2022-05-03 (×9): 50 mg via ORAL
  Filled 2022-04-25 (×9): qty 1

## 2022-04-25 MED ORDER — PANTOPRAZOLE SODIUM 40 MG PO TBEC
40.0000 mg | DELAYED_RELEASE_TABLET | Freq: Every day | ORAL | Status: DC
  Administered 2022-04-25 – 2022-05-04 (×10): 40 mg via ORAL
  Filled 2022-04-25 (×10): qty 1

## 2022-04-25 MED ORDER — ACETAMINOPHEN 160 MG/5ML PO SOLN: 650.0000 mg | ORAL | Status: DC | PRN

## 2022-04-25 MED ORDER — DULOXETINE HCL 30 MG PO CPEP
30.0000 mg | ORAL_CAPSULE | Freq: Every day | ORAL | Status: DC
  Administered 2022-04-25 – 2022-05-04 (×10): 30 mg via ORAL
  Filled 2022-04-25 (×10): qty 1

## 2022-04-25 MED ORDER — ENOXAPARIN SODIUM 40 MG/0.4ML IJ SOSY
40.0000 mg | PREFILLED_SYRINGE | INTRAMUSCULAR | Status: DC
  Administered 2022-04-25 – 2022-05-01 (×7): 40 mg via SUBCUTANEOUS
  Filled 2022-04-25 (×7): qty 0.4

## 2022-04-25 NOTE — Assessment & Plan Note (Signed)
-   Dutasteride and Flomax

## 2022-04-25 NOTE — Progress Notes (Signed)
   04/25/22 0800  Clinical Encounter Type  Visited With Patient and family together  Visit Type Initial;Code;Spiritual support  Consult/Referral To Chaplain  Spiritual Encounters  Spiritual Needs Prayer   Chaplain responded to Code Stroke to provide support to patient and family member.

## 2022-04-25 NOTE — Consult Note (Signed)
0725 Stroke cart activated.  Pt in CT upon activation.  9150 Dr Sherrie Sport joins cart. At request of patient his daughter in law provides interpretation.  MRS 0.   0809 Pt to ct for additional imaging added by Dr Sherrie Sport.

## 2022-04-25 NOTE — Assessment & Plan Note (Signed)
CD4 246 and VL 60 on 12/11/17 -continue home meds

## 2022-04-25 NOTE — Assessment & Plan Note (Signed)
-   Continue home medications 

## 2022-04-25 NOTE — Plan of Care (Signed)

## 2022-04-25 NOTE — Progress Notes (Signed)
*  PRELIMINARY RESULTS* Echocardiogram 2D Echocardiogram has been performed.  Brian [name] 04/25/2022, 2:29 PM

## 2022-04-25 NOTE — Consult Note (Signed)
Neurology Consultation  Reason for Consult: Concern for stroke Requesting Physician: Marrion Coy  CC: Left-sided weakness  History is obtained from: Patient and chart review and daughter-in-law at bedside (translating per patient preference)  HPI: Brian [name] is a 81 y.o. male with a past medical history significant for hypertension, hyperlipidemia, HIV, and asthma.  HPI Per telespecialists: "Patient was brought by EMS for symptoms of numbness, difficulty speaking. 81 year old man with history of HIV, HLD, and HTN who presents with numbness, weakness, and difficulty speaking. His symptoms began at 2300 last night. He was watching a soccer game when he felt the numbness in his left leg begin. Since then, the symptoms have never completely resolved and have actually worsened. He then began feeling some numbness in his right leg also but the left leg is worse then the right. He also admits to difficulty moving his legs, as well as back pain. He says that he is having difficulty speaking also, described as his tongue isn't working. The daughter in law is at the bedside to interpret (patient was offered another intepreter but he declined and said he preferred that his daughter-in-law interpret). She states that his speech is very slurred. He called his family this morning and told them about his symptoms. EMS was called and noted a left facial droop and left lower leg weakness. Therefore, an SA was called. This has never happened before. His HIV is currently well controlled with no evidence of HIV on laboratory testing, per the daughter-in-law."  Details confirmed with patient and daughter-in-law at bedside.  They note he does have a known diagnosis of obstructive sleep apnea, was tested many years ago but unable to tolerate the mask.  Willing to try it again given there are new masks on the market that may be more tolerable for the patient  LKW: 2300 on 6/28 tPA given?: No, out of the window IA  performed?: No, no LVO Premorbid modified rankin scale:      0 - No symptoms.  ROS: Unable to obtain due to altered mental status.   Past Medical History:  Diagnosis Date   Arthritis    Asthma    Bradycardia    Depression    HIV (human immunodeficiency virus infection) (HCC)    Hyperlipidemia    Past Surgical History:  Procedure Laterality Date   cataracts     EYE SURGERY     PACEMAKER INSERTION N/A 08/02/2015   Procedure: INSERTION PACEMAKER;  Surgeon: Corky Downs, MD;  Location: [name];  Service: Cardiovascular;  Laterality: N/A;   Current Outpatient Medications  Medication Instructions   abacavir-dolutegravir-lamiVUDine (TRIUMEQ) 600-50-300 MG tablet 1 tablet, Oral, Daily   alendronate (FOSAMAX) 70 mg, Oral, Weekly, Take on Saturday.   atorvastatin (LIPITOR) 40 mg, Oral, Daily   azelastine (OPTIVAR) 0.05 % ophthalmic solution 1 drop, Ophthalmic, 2 times daily   clonazePAM (KLONOPIN) 0.5 mg, Oral, 2 times daily PRN   DULoxetine (CYMBALTA) 30 mg, Oral, Daily   dutasteride (AVODART) 0.5 mg, Oral, Daily   loratadine (CLARITIN) 10 mg, Oral, Daily   metoprolol succinate (TOPROL-XL) 25 mg, Oral, 2 times daily   nystatin-triamcinolone ointment (MYCOLOG) Topical, 2 times daily   omeprazole (PRILOSEC) 20 mg, Oral, Daily   pregabalin (LYRICA) 50 mg, Oral, 2 times daily   tamsulosin (FLOMAX) 0.4 mg, Oral, Daily   traMADol-acetaminophen (ULTRACET) 37.5-325 MG tablet 1 tablet, Oral, Daily PRN   triamcinolone cream (KENALOG) 0.1 % 1 application , Topical, 2 times daily   VENTOLIN  HFA 108 (90 BASE) MCG/ACT inhaler 2 puffs, Inhalation, 4 times daily PRN, For shortness of breath and/or wheezing.     Current Facility-Administered Medications:     stroke: early stages of recovery book, , Does not apply, Once, Lorretta Harp, MD   abacavir-dolutegravir-lamiVUDine (TRIUMEQ) 600-50-300 MG per tablet 1 tablet, 1 tablet, Oral, Daily, Lorretta Harp, MD   acetaminophen (TYLENOL) tablet 650 mg, 650  mg, Oral, Q4H PRN **OR** acetaminophen (TYLENOL) 160 MG/5ML solution 650 mg, 650 mg, Per Tube, Q4H PRN **OR** acetaminophen (TYLENOL) suppository 650 mg, 650 mg, Rectal, Q4H PRN, Lorretta Harp, MD   traMADol Janean Sark) tablet 50 mg, 50 mg, Oral, Daily PRN, 50 mg at 04/25/22 2334 **AND** acetaminophen (TYLENOL) tablet 325 mg, 325 mg, Oral, Daily PRN, Lowella Bandy, RPH   albuterol (PROVENTIL) (2.5 MG/3ML) 0.083% nebulizer solution 2.5 mg, 2.5 mg, Inhalation, Q4H PRN, Lorretta Harp, MD   aspirin chewable tablet 324 mg, 324 mg, Oral, Daily, Lorretta Harp, MD   atorvastatin (LIPITOR) tablet 40 mg, 40 mg, Oral, Daily, Lorretta Harp, MD, 40 mg at 04/25/22 2334   clonazePAM (KLONOPIN) tablet 0.5 mg, 0.5 mg, Oral, BID PRN, Lorretta Harp, MD, 0.5 mg at 04/25/22 2334   dextromethorphan-guaiFENesin (MUCINEX DM) 30-600 MG per 12 hr tablet 1 tablet, 1 tablet, Oral, BID PRN, Lorretta Harp, MD   DULoxetine (CYMBALTA) DR capsule 30 mg, 30 mg, Oral, Daily, Lorretta Harp, MD, 30 mg at 04/25/22 2334   dutasteride (AVODART) capsule 0.5 mg, 0.5 mg, Oral, Daily, Lorretta Harp, MD, 0.5 mg at 04/25/22 2340   enoxaparin (LOVENOX) injection 40 mg, 40 mg, Subcutaneous, Q24H, Lorretta Harp, MD, 40 mg at 04/25/22 2335   hydrALAZINE (APRESOLINE) injection 5 mg, 5 mg, Intravenous, Q2H PRN, Lorretta Harp, MD   ondansetron (ZOFRAN) injection 4 mg, 4 mg, Intravenous, Q8H PRN, Lorretta Harp, MD   pantoprazole (PROTONIX) EC tablet 40 mg, 40 mg, Oral, Daily, Lorretta Harp, MD, 40 mg at 04/25/22 2335   pregabalin (LYRICA) capsule 50 mg, 50 mg, Oral, QHS, Lorretta Harp, MD, 50 mg at 04/25/22 2335   senna-docusate (Senokot-S) tablet 1 tablet, 1 tablet, Oral, QHS PRN, Lorretta Harp, MD   tamsulosin [facility]) capsule 0.4 mg, 0.4 mg, Oral, Daily, Lorretta Harp, MD, 0.4 mg at 04/25/22 2335   Family History  Problem Relation Age of Onset   Heart disease Brother    Hypertension Other      Social History:  reports that he has never smoked. He has never used smokeless tobacco. He  reports current alcohol use of about 2.0 standard drinks of alcohol per week. He reports that he does not use drugs.   Exam: Current vital signs: BP (!) 159/78 (BP Location: Right Arm)   Pulse (!) 51   Temp 98.7 F (37.1 C) (Oral)   Resp 16   Ht 5\' 6"  (1.676 m)   Wt 76 kg   SpO2 97%   BMI 27.04 kg/m  Vital signs in last 24 hours: Temp:  [98 F (36.7 C)-100.6 F (38.1 C)] 98.7 F (37.1 C) (06/30 0033) Pulse Rate:  [40-57] 51 (06/30 0557) Resp:  [16-22] 16 (06/30 0557) BP: (97-175)/(57-94) 159/78 (06/30 0557) SpO2:  [95 %-100 %] 97 % (06/30 0557)   Physical Exam  Constitutional: Appears well-developed and well-nourished.  Psych: Affect appropriate to situation, slightly flat affect but cooperative Eyes: No scleral injection HENT: No oropharyngeal obstruction.  MSK: no joint deformities.  Cardiovascular: Perfusing extremities well Respiratory: Effort normal, non-labored breathing GI: Soft.  No distension.  There is no tenderness.  Skin: Warm dry and intact visible skin  Neuro: Mental Status: Patient is awake, alert, oriented to person, place, month, year, and situation. Patient is able to give a clear and coherent history. No signs of aphasia or neglect Cranial Nerves: II: Visual Fields are full. Pupils are equal, round, and reactive to light.   III,IV, VI: EOMI notable for left eye ptosis, no diplopia, saccadic pursuits V: Facial sensation is reduced on the left face VII: Facial movement is notable for left facial droop.  VIII: hearing is intact to voice X: Uvula difficult to visualize due to Mallampati score XI: Shoulder shrug is symmetric. XII: tongue is midline without atrophy or fasciculations.  Motor: Tone is normal. Bulk is normal.  4 to 4+/5 strength in the left upper extremity and lower extremity in an upper motor neuron pattern, 5/5 throughout the right side Sensory: Sensation is reduced on the left arm and leg but no extinction to double simultaneous  stimuli Deep Tendon Reflexes: 2+ and symmetric in the brachioradialis and patellae.  Cerebellar: FNF and HKS are intact bilaterally, within limits of weakness on the left side Gait:  Deferred   NIHSS total 6 for telespecialists (one-point for facial palsy, 2 points for left leg weakness, one-point for limb ataxia, one-point for loss of sensation, one-point for dysarthria) at 7:23 AM Similar NIH for me, although I scored 2 points for facial droop and one-point for left leg weakness  I have reviewed labs in epic and the results pertinent to this consultation are:  Basic Metabolic Panel: Recent Labs  Lab 04/25/22 0739  NA 138  K 3.6  CL 110  CO2 22  GLUCOSE 121*  BUN 11  CREATININE 0.92  CALCIUM 8.7*    CBC: Recent Labs  Lab 04/25/22 0739  WBC 6.5  NEUTROABS 4.8  HGB 13.1  HCT 38.4*  MCV 88.9  PLT 202    Coagulation Studies: Recent Labs    04/25/22 0739  LABPROT 13.9  INR 1.1    Lab Results  Component Value Date   CHOL 149 04/26/2022   HDL 37 (L) 04/26/2022   LDLCALC 86 04/26/2022   TRIG 131 04/26/2022   CHOLHDL 4.0 04/26/2022   Lab Results  Component Value Date   HGBA1C 5.8 (H) 04/25/2022     Unresulted Labs (From admission, onward)    None      ECHO 6/29  1. Left ventricular ejection fraction, by estimation, is 55 to 60%. The left ventricle has normal function. The left ventricle has no regional  wall motion abnormalities. There is mild left ventricular hypertrophy. Left ventricular diastolic parameters are consistent with Grade II diastolic dysfunction (pseudonormalization).   2. Right ventricular systolic function is normal. The right ventricular size is normal.   3. Left atrial size was mildly dilated.   4. The mitral valve is normal in structure. No evidence of mitral valve regurgitation.   5. The aortic valve is grossly normal. Aortic valve regurgitation is not visualized.   6. Aortic dilatation noted. There is mild dilatation of the aortic  root, measuring 39 mm.   7. The inferior vena cava is normal in size with <50% respiratory  variability, suggesting right atrial pressure of 8 mmHg.   I have personally reviewed, agree with radiology:    Head CT: 1. No acute cortically based infarct or acute intracranial hemorrhage identified. ASPECTS 10. 2. Mild small vessel disease for age, although appears progressed since 2021.  CTA: 1. No emergent  large vessel occlusion. 2. Moderate left P2 PCA stenosis. 3. 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 4. Partially imaged ground-glass opacities in the visualized lung apices, which could represent expiratory changes/atelectasis and/or pneumonia. CT of the chest could further evaluate if clinically warranted.  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  Appreciate vascular surgery recommendations regarding the aortic ulcer.  From a stroke risk perspective, if safe from a vascular surgery perspective, recommend dual antiplatelet therapy with aspirin and Plavix.  Unfortunately cannot confirm the extent and distribution of strokes on head CT due to the underlying chronic small vessel disease.  Cannot obtain MRI brain this hospitalization due to patient's pacemaker and lack of appropriate resources to perform pacemaker conditional MRIs at this facility  Recommendations: - 30 day event monitor on discharge given left atrial enlargement on echocardiogram - Outpatient MRI brain to establish extent of strokes more accurately; unable to obtain on an inpatient basis due to his pacemaker - If cleared by vascular surgery, please start Plavix 300 mg loading dose followed by 75 mg daily - LDL above goal at 86, recommend to increase home atorvastatin from 40 to 80 mg and retime to nightly -- patient is taking 40 mg nightly but prefers to defer dose increase at this time due to side  effects from last dose increase - Outpatient sleep study with goal of obtaining a more comfortable CPAP machine that he may tolerate - Outpatient neurology follow-up  Brooke Dare MD-PhD [facility] [phone] [facility] coverage for [facility] is from 8 AM to 4 AM in-house and 4 PM to 8 PM by telephone/video. 8 PM to 8 AM emergent questions or overnight urgent questions should be addressed to Teleneurology On-call or [facility] neurohospitalist; contact information can be found on AMION

## 2022-04-25 NOTE — Progress Notes (Signed)
Admission profile updated. ?

## 2022-04-25 NOTE — Evaluation (Signed)
Occupational Therapy Evaluation Patient Details Name: Brian [name] MRN: [id number] DOB: [birth year] Today's Date: 04/25/2022   History of Present Illness Pt is an 81 yo male that presented to ED for L sided weakness and slurred speech. PMH of COPD, HTN, HIV.   Clinical Impression   Patient presenting with decreased Ind in self care,balance, functional mobility/transfers, endurance, strength, and coordination. Patient's daughter in law present and pt requests her to translate to pt during session. Pt lives at home alone and is very independent with all aspects of self care and IADLs. Pt is very tearful during session.  Patient currently functioning at min - mod A for functional mobility and transfers. Pt ambulates 10' HHA with mod A in room. Min A for toilet transfer and pt urinates on floor while RN present.  Patient will benefit from acute OT to increase overall independence in the areas of ADLs, functional mobility, and safety awareness in order to safely discharge to next venue of care.      Recommendations for follow up therapy are one component of a multi-disciplinary discharge planning process, led by the attending physician.  Recommendations may be updated based on patient status, additional functional criteria and insurance authorization.   Follow Up Recommendations  Skilled nursing-short term rehab (<3 hours/day)    Assistance Recommended at Discharge Frequent or constant Supervision/Assistance  Patient can return home with the following A lot of help with walking and/or transfers;A lot of help with bathing/dressing/bathroom;Assistance with cooking/housework;Direct supervision/assist for medications management;Help with stairs or ramp for entrance;Assist for transportation;Direct supervision/assist for financial management    Functional Status Assessment  Patient has had a recent decline in their functional status and demonstrates the ability to make significant improvements in function  in a reasonable and predictable amount of time.  Equipment Recommendations  Other (comment) (defer to next venue of care)       Precautions / Restrictions Precautions Precautions: Fall Restrictions Weight Bearing Restrictions: No      Mobility Bed Mobility Overal bed mobility: Needs Assistance Bed Mobility: Supine to Sit, Sit to Supine     Supine to sit: Mod assist Sit to supine: Min assist        Transfers Overall transfer level: Needs assistance Equipment used: 1 person hand held assist Transfers: Sit to/from Stand, Bed to chair/wheelchair/BSC Sit to Stand: Min assist     Step pivot transfers: Min assist, Mod assist            Balance Overall balance assessment: Needs assistance Sitting-balance support: Feet supported, Bilateral upper extremity supported Sitting balance-Leahy Scale: Fair     Standing balance support: During functional activity Standing balance-Leahy Scale: Poor                             ADL either performed or assessed with clinical judgement   ADL Overall ADL's : Needs assistance/impaired     Grooming: Wash/dry hands;Wash/dry face;Set up;Supervision/safety                   Toilet Transfer: Minimal assistance   Toileting- Clothing Manipulation and Hygiene: Moderate assistance               Vision Patient Visual Report: No change from baseline              Pertinent Vitals/Pain Pain Assessment Pain Assessment: No/denies pain     Hand Dominance Right   Extremity/Trunk Assessment Upper Extremity Assessment Upper Extremity Assessment: LUE  deficits/detail LUE Coordination: decreased fine motor;decreased gross motor   Lower Extremity Assessment Lower Extremity Assessment: Defer to PT evaluation RLE Deficits / Details: grossly 4/5 LLE Deficits / Details: difficult for true MMT assessment, but noted for less ability to move against gravity (can SLR), LAQ LLE Coordination: decreased fine  motor;decreased gross motor   Cervical / Trunk Assessment Cervical / Trunk Assessment: Normal   Communication Communication Communication: Other (comment) (daughter in law interprets per pt request)   Cognition Arousal/Alertness: Awake/alert Behavior During Therapy: WFL for tasks assessed/performed, Anxious Overall Cognitive Status: Difficult to assess                                 General Comments: Pt is very tearful throughout session and is oriented to self                Home Living Family/patient expects to be discharged to:: Private residence Living Arrangements: Alone Available Help at Discharge: Family;Available PRN/intermittently Type of Home: Apartment Home Access: Stairs to enter [employer] of Steps: "a couple of steps" Entrance Stairs-Rails: None Home Layout: One level     Bathroom Shower/Tub: Teacher, early years/pre: Standard     Home Equipment: Conservation officer, nature (2 wheels);Cane - single point          Prior Functioning/Environment Prior Level of Function : Independent/Modified Independent             Mobility Comments: per family pt lives alone ADLs Comments: Pt is independent in all ADLs and IADLs per family        OT Problem List: Decreased strength;Decreased coordination;Pain;Decreased range of motion;Decreased activity tolerance;Decreased safety awareness;Impaired balance (sitting and/or standing);Decreased cognition;Decreased knowledge of use of DME or AE;Impaired UE functional use      OT Treatment/Interventions: Self-care/ADL training;Balance training;Therapeutic exercise;Therapeutic activities;Neuromuscular education;Energy conservation;DME and/or AE instruction;Manual therapy;Patient/family education;Cognitive remediation/compensation    OT Goals(Current goals can be found in the care plan section) Acute Rehab OT Goals Patient Stated Goal: to get better OT Goal Formulation: With  patient/family Time For Goal Achievement: 05/09/22 Potential to Achieve Goals: Fair ADL Goals Pt Will Perform Grooming: with supervision;standing Pt Will Perform Lower Body Dressing: with supervision;sit to/from stand Pt Will Transfer to Toilet: with supervision;ambulating Pt Will Perform Toileting - Clothing Manipulation and hygiene: with supervision;sit to/from stand  OT Frequency: Min 3X/week       AM-PAC OT "6 Clicks" Daily Activity     Outcome Measure Help from another person eating meals?: A Little Help from another person taking care of personal grooming?: A Little Help from another person toileting, which includes using toliet, bedpan, or urinal?: A Lot Help from another person bathing (including washing, rinsing, drying)?: A Lot Help from another person to put on and taking off regular upper body clothing?: A Little Help from another person to put on and taking off regular lower body clothing?: A Lot 6 Click Score: 15   End of Session Nurse Communication: Mobility status  Activity Tolerance: Patient tolerated treatment well Patient left: in bed;with call bell/phone within reach;with bed alarm set;with family/visitor present  OT Visit Diagnosis: Unsteadiness on feet (R26.81);Repeated falls (R29.6);Muscle weakness (generalized) (M62.81)                Time: [id number] OT Time Calculation (min): 11 min Charges:  OT General Charges $OT Visit: 1 Visit OT Evaluation $OT Eval Moderate Complexity: 1 Mod  Aayush Gelpi, MS, OTR/L ,  CBIS ascom [phone]  04/25/22, 3:50 PM

## 2022-04-25 NOTE — ED Triage Notes (Addendum)
Pt to ED via [facility] with c/o of R sided facial droop and L sided weakness. Pt normally independent. Neuro at bedside.  LKW 1800 last night

## 2022-04-25 NOTE — Consult Note (Addendum)
[facility] Tele[facility]urology Consult Services   Patient Name:   Brian [name], Brian [name] Date of Birth:   12/31/1940 Identification Number:   MRN - [id number] Date of Service:   04/25/2022 07:33:19  Diagnosis:       I63.9 - Cerebrovascular accident (CVA), unspecified mechanism (HCC)  Impression:      mild left facial droop, left finger to nose ataxia, decreased sensation in the left arm, slurred speech, left leg weakness, and subjective LLE weakness/numbness- concerning for posterior circulation versus right hemispheric stroke. 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 However, a spinal cord lesion would not explain the left facial droop and slurred speech seen on exam and therefore, this is less likely but will also need to be ruled out if brain imaging is negative.  Our recommendations are outlined below.  Recommendations:        Stroke/Telemetry Floor       Neuro Checks       Bedside Swallow Eval       DVT Prophylaxis       IV Fluids, Normal Saline       Head of Bed 30 Degrees       Euglycemia and Avoid Hyperthermia (PRN Acetaminophen)       Initiate or continue Aspirin 325 MG daily       Antihypertensives PRN if Blood pressure is greater than 220/120 or there is a concern for End organ damage/contraindications for permissive HTN. If blood pressure is greater than 220/120 give labetalol PO or IV or Vasotec IV with a goal of 15% reduction in BP during the first 24 hours.       If CTA/P is negative, he will require a stroke workup, including brain MRI. I would also opt to obtain STAT CT of the total spine since he is complaining of LBP with B/L LE weakness/numbness (which is subjective in the RLE) and exam shows findings in the LUE. If CT total spine is negative, may need to consider obtaining MRI total spine to rule out spinal cord involvement of HIV. However, since he has slurred  speech and facial droop, a spinal cord lesion appears less likely. Spine MRI should be STAT if his symptoms progress or he develops saddle anesthesia, new symptoms in the upper or lower extremities, or bowel/urinary incontinence.  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  Therefore, will defer spinal imaging unless brain MRI shows no source for all of his symptoms, the symptoms in the limbs worsen, or he develops new symptoms such as a sensory level, saddle anesthesia, or bowel/urinary incontinence.  Per facility request will defer further work up, management, and referrals to inpatient service, inclusive of inpatient neurology consult.  Sign Out:       Discussed with Emergency Department Provider    ------------------------------------------------------------------------------  Advanced Imaging: CTA Head and Neck Completed.  CTP Completed.  LVO:No (per my review.  Official radiology read is currently pending)  Patient doesn't meet criteria for emergent NIR consideration   Metrics: Last Known Well: 04/24/2022 23:00:00 [facility] Notification Time: 04/25/2022 07:33:19 Arrival Time: 04/25/2022 07:23:00 Stamp Time: 04/25/2022 07:33:19 Initial Response Time: 04/25/2022 07:38:40 Symptoms: numbness, difficulty speaking. Initial patient interaction:  04/25/2022 07:42:03 NIHSS Assessment Completed: 04/25/2022 08:01:00 Patient is not a candidate for Thrombolytic. Thrombolytic Medical Decision: 04/25/2022 08:02:00 Patient was not deemed candidate for Thrombolytic because of following reasons: Last Well Known Above 4.5 Hours.  CT head showed no acute hemorrhage or acute core infarct.  Primary Provider Notified of Diagnostic Impression and Management Plan  on: 04/25/2022 08:08:17    ------------------------------------------------------------------------------  History of Present Illness: Patient is a 81 year old Male.  Patient was brought by EMS for symptoms of numbness, difficulty speaking. 81 year old man with history of HIV, HLD, and HTN who presents with numbness, weakness, and difficulty speaking. His symptoms began at 2300 last night. He was watching a soccer game when he felt the numbness in his left leg begin. Since then, the symptoms have never completely resolved and have actually worsened. He then began feeling some numbness in his right leg also but the left leg is worse then the right. He also admits to difficulty moving his legs, as well as back pain. He says that he is having difficulty speaking also, described as his tongue isn't working. The daughter in law is at the bedside to interpret (patient was offered another intepreter but he declined and said he preferred that his daughter-in-law interpret). She states that his speech is very slurred. He called his family this morning and told them about his symptoms. EMS was called and noted a left facial droop and left lower leg weakness. Therefore, an SA was called. This has never happened before. His HIV is currently well controlled with no evidence of HIV on laboratory testing, per the daughter-in-law.    Past Medical History:      Hypertension      Hyperlipidemia O[place] PMH:  HIV  Medications:  No Anticoagulant use  Antiplatelet use: Yes ASA 81 mg Reviewed EMR for current medications  Allergies:  Reviewed  Social History: Smoking: No  Family History:  There is no family history of premature cerebrovascular disease pertinent to this consultation  ROS : 14 Points Review of Systems was performed and was negative except mentioned in HPI.  Past Surgical History: There Is No Surgical History Contributory To Today's Visit     Examination: BP(151/71), Pulse(48), 1A:  Level of Consciousness - Alert; keenly responsive + 0 1B: Ask Month and Age - Both Questions Right + 0 1C: Blink Eyes & Squeeze Hands - Performs Both Tasks + 0 2: Test Horizontal Extraocular Movements - Normal + 0 3: Test Visual Fields - No Visual Loss + 0 4: Test Facial Palsy (Use Grimace if Obtunded) - Minor paralysis (flat nasolabial fold, smile asymmetry) + 1 5A: Test Left Arm Motor Drift - No Drift for 10 Seconds + 0 5B: Test Right Arm Motor Drift - No Drift for 10 Seconds + 0 6A: Test Left Leg Motor Drift - Some Effort Against Gravity + 2 6B: Test Right Leg Motor Drift - No Drift for 5 Seconds + 0 7: Test Limb Ataxia (FNF/Heel-Shin) - Ataxia in 1 Limb + 1 8: Test Sensation - Mild-Moderate Loss: Less Sharp/More Dull + 1 9: Test Language/Aphasia - Normal; No aphasia + 0 10: Test Dysarthria - Mild-Moderate Dysarthria: Slurring but can be understood + 1 11: Test Extinction/Inattention - No abnormality + 0  NIHSS Score: 6   Pre-Morbid Modified Rankin Scale: 0 Points = No symptoms at all   Patient/Family was informed the Neurology Consult would occur via TeleHealth consult by way of interactive audio and video telecommunications and consented  to receiving care in this manner.   Patient is being evaluated for possible acute neurologic impairment and high probability of imminent or life-threatening deterioration. I spent total of 35 minutes providing care to this patient, including time for face to face visit via telemedicine, review of medical records, imaging studies and discussion of findings with providers, the patient and/or family.   Dr Vena Austria   [facility] [phone]   Case [id number]

## 2022-04-25 NOTE — ED Provider Notes (Signed)
[facility] Provider Note    Event Date/Time   First MD Initiated Contact with Patient 04/25/22 [id number]     (approximate)   History   Chief Complaint: Code Stroke   Patient is primarily Spanish-speaking.  Family member at bedside is interpreting on behalf of patient at patient's request.  He was offered hospital based interpreter and declines.  HPI  Brian [name] is a 81 y.o. male with a history of COPD, hypertension, HIV who comes to the ED due to left-sided weakness and slurred speech that started at 6 PM last night.  Symptoms are constant.  No trauma, no fever.  No neck pain     Physical Exam   Triage Vital Signs: ED Triage Vitals  Enc Vitals Group     BP 04/25/22 0742 (!) 151/71     Pulse Rate 04/25/22 0742 (!) 48     Resp 04/25/22 0742 (!) 22     Temp --      Temp src --      SpO2 04/25/22 0742 96 %     Weight 04/25/22 0744 167 lb 8.8 oz (76 kg)     Height --      Head Circumference --      Peak Flow --      Pain Score --      Pain Loc --      Pain Edu? --      Excl. in GC? --     Most recent vital signs: Vitals:   04/25/22 0830 04/25/22 0930  BP: (!) 153/61 (!) 152/67  Pulse: (!) 46 (!) 40  Resp: 16 18  SpO2: 96% 98%    General: Awake, no distress.  CV:  Good peripheral perfusion.  Bradycardia heart rate 55 Resp:  Normal effort.  Abd:  No distention.  Other:  Dysarthria.  Weakness of left upper extremity and left lower extremity.  Left facial droop.  NIH stroke scale 6   ED Results / Procedures / Treatments   Labs (all labs ordered are listed, but only abnormal results are displayed) Labs Reviewed  CBC - Abnormal; Notable for the following components:      Result Value   HCT 38.4 (*)    All other components within normal limits  COMPREHENSIVE METABOLIC PANEL - Abnormal; Notable for the following components:   Glucose, Bld 121 (*)    Calcium 8.7 (*)    All other components within normal limits  CBG MONITORING, ED -  Abnormal; Notable for the following components:   Glucose-Capillary 116 (*)    All other components within normal limits  PROTIME-INR  APTT  DIFFERENTIAL  ETHANOL  I-STAT CREATININE, ED  TROPONIN I (HIGH SENSITIVITY)     EKG Interpreted by me Sinus bradycardia, rate of 50.  Left axis, LVH.  Inferolateral T wave inversions consistent with repolarization abnormality from LVH.   RADIOLOGY CT head viewed and interpreted by me, negative for ICH or dense infarct.  Radiology report reviewed.  CTA head and neck negative for acute intracranial/vascular findings.  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   PROCEDURES:  Procedures   MEDICATIONS ORDERED IN ED: Medications  aspirin chewable tablet 324 mg (has no administration in time range)  ondansetron (ZOFRAN) injection 4 mg (has no administration in time range)  hydrALAZINE (APRESOLINE) injection 5 mg (has no administration in time range)  albuterol (VENTOLIN HFA) 108 (90 Base) MCG/ACT inhaler 2 puff (has no administration in time range)  dextromethorphan-guaiFENesin (MUCINEX DM) 30-600 MG per 12 hr tablet 1 tablet (has no administration in time range)  sodium chloride flush (NS) 0.9 % injection 3 mL (3 mLs Intravenous Given 04/25/22 0813)  iohexol (OMNIPAQUE) 350 MG/ML injection 100 mL (100 mLs Intravenous Contrast Given 04/25/22 0831)     IMPRESSION / MDM / ASSESSMENT AND PLAN / ED COURSE  I reviewed the triage vital signs and the nursing notes.                              Differential diagnosis includes, but is not limited to, acute ischemic stroke, intracranial hemorrhage, dehydration, metabolic encephalopathy, electrolyte abnormality  Patient's presentation is most consistent with acute presentation with potential threat to life or bodily function.  Patient presents with symptoms of acute stroke, outside of window of TNK.  Will obtain stat stroke evaluation and CT head   Clinical Course as of  04/25/22 0957  Thu Apr 25, 2022  0742 Teleneurologist online with pt [PS]  [phone] Radiology Dr. Margo Aye advises CT head is non acute. [PS]  [id number] Exam discussed with neurologist who finds NIH stroke scale of 6 concerning for right hemispheric stroke.  She recommends CT angiogram head and neck now to evaluate for LVO. [PS]    Clinical Course User Index [PS] Sharman Cheek, MD    ----------------------------------------- 10:00 AM on 04/25/2022 ----------------------------------------- CTA negative except for incidental finding of atherosclerotic ulceration of aortic arch and possible upper lung infiltrate.  Patient does not have respiratory symptoms and presentation is not consistent with pneumonia.  I suspect this is due to his chronic lung disease.  Case discussed with hospitalist for further management.   FINAL CLINICAL IMPRESSION(S) / ED DIAGNOSES   Final diagnoses:  Cerebrovascular accident (CVA), unspecified mechanism (HCC)     Rx / DC Orders   ED Discharge Orders     None        Note:  This document was prepared using Dragon voice recognition software and may include unintentional dictation errors.   Sharman Cheek, MD 04/25/22 1001

## 2022-04-25 NOTE — Assessment & Plan Note (Signed)
Hold home blood pressure medications, metoprolol -IV hydralazine as needed for SBP> 220 and DBP> 110

## 2022-04-25 NOTE — Assessment & Plan Note (Addendum)
Patient's symptoms are consistent with stroke.  CT head negative.  CT angiogram of head and neck is negative for LVO.  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  Consulted Dr. Iver Nestle of neuro.  - will admit to tele bed as inpt - will hold oral Bp meds to allow permissive HTN in the setting of acute stroke, for SBP>220 or dBP>110 - ASA and lipitor - fasting lipid panel and HbA1c  - 2D transthoracic echocardiography  - swallowing screen. If fails, will get SLP - PT/OT consult

## 2022-04-25 NOTE — ED Notes (Signed)
Neuro MD on assessing pt

## 2022-04-25 NOTE — Assessment & Plan Note (Signed)
Stable -Bronchodilators 

## 2022-04-25 NOTE — ED Notes (Signed)
PT at bedside.

## 2022-04-25 NOTE — ED Notes (Signed)
Pt presents with c/o of weakness to BLE but more on the L LE. Pt also c/o of numbness to BLE as well.   Pt also c/o of being aphasic as well and not being able to get his words out this morning.

## 2022-04-25 NOTE — Evaluation (Signed)
Physical Therapy Evaluation Patient Details Name: Brian [name] MRN: [id number] DOB: 08/08/1941 Today's Date: 04/25/2022  History of Present Illness  Pt is an 81 yo male that presented to ED for L sided weakness and slurred speech. PMH of COPD, HTN, HIV.  Clinical Impression  Patient alert, oriented to self only. Pt/family requested that the daughter interpret. Per daughter pt at baseline is independent, lives alone.  Pt able to lift all extremities against gravity but with assessment showed LLE weakness and deficits in coordination. Supine to sit with minA, needed cues for sequencing. Sit <> Stand minA, and educated on RW management/hand placement. He was able to take a few steps forwards/backwards, minA throughout for steadying, RW management, and RW management.  Pt became very tearful, further mobility deferred for now. Overall the patient demonstrated deficits (see "PT Problem List") that impede the patient's functional abilities, safety, and mobility and would benefit from skilled PT intervention. Recommendation at this time is SNF due to current level of assistance needed and decline from PLOF.         Recommendations for follow up therapy are one component of a multi-disciplinary discharge planning process, led by the attending physician.  Recommendations may be updated based on patient status, additional functional criteria and insurance authorization.  Follow Up Recommendations Skilled nursing-short term rehab (<3 hours/day) Can patient physically be transported by private vehicle: No    Assistance Recommended at Discharge Frequent or constant Supervision/Assistance  Patient can return home with the following  A lot of help with walking and/or transfers;A lot of help with bathing/dressing/bathroom;Assistance with cooking/housework;Direct supervision/assist for financial management;Assist for transportation;Help with stairs or ramp for entrance;Direct supervision/assist for medications  management    Equipment Recommendations Other (comment) (TBD next level of care)  Recommendations for Other Services       Functional Status Assessment Patient has had a recent decline in their functional status and demonstrates the ability to make significant improvements in function in a reasonable and predictable amount of time.     Precautions / Restrictions Precautions Precautions: Fall Restrictions Weight Bearing Restrictions: No      Mobility  Bed Mobility Overal bed mobility: Needs Assistance Bed Mobility: Supine to Sit, Sit to Supine     Supine to sit: Mod assist, HOB elevated Sit to supine: Min assist        Transfers Overall transfer level: Needs assistance Equipment used: Rolling walker (2 wheels) Transfers: Sit to/from Stand Sit to Stand: Min assist                Ambulation/Gait Ambulation/Gait assistance: Min assist, Mod assist Gait Distance (Feet): 3 Feet Assistive device: Rolling walker (2 wheels)   Gait velocity: decreased     General Gait Details: assistance needed for RW, posterior lean noted. step by step cueing  Stairs            Wheelchair Mobility    Modified Rankin (Stroke Patients Only)       Balance Overall balance assessment: Needs assistance Sitting-balance support: Feet supported, Bilateral upper extremity supported Sitting balance-Leahy Scale: Fair     Standing balance support: Reliant on assistive device for balance Standing balance-Leahy Scale: Poor Standing balance comment: minA due to posterior LOB                             Pertinent Vitals/Pain Pain Assessment Pain Assessment: No/denies pain    Home Living Family/patient expects to be discharged to:: Private  residence Living Arrangements: Alone Available Help at Discharge: Family;Available PRN/intermittently Type of Home: Apartment Home Access: Stairs to enter Entrance Stairs-Rails: None Entrance Stairs-Number of Steps: "a couple  of steps"   Home Layout: One level Home Equipment: Agricultural consultant (2 wheels);Cane - single point      Prior Function Prior Level of Function : Independent/Modified Independent             Mobility Comments: per family pt lives alone       Hand Dominance   Dominant Hand: Right    Extremity/Trunk Assessment   Upper Extremity Assessment Upper Extremity Assessment: Defer to OT evaluation    Lower Extremity Assessment Lower Extremity Assessment: RLE deficits/detail;LLE deficits/detail RLE Deficits / Details: grossly 4/5 LLE Deficits / Details: difficult for true MMT assessment, but noted for less ability to move against gravity (can SLR), LAQ LLE Coordination: decreased fine motor;decreased gross motor    Cervical / Trunk Assessment Cervical / Trunk Assessment: Normal  Communication   Communication: Other (comment) (daughter interprets per pt/family request)  Cognition Arousal/Alertness: Awake/alert Behavior During Therapy: WFL for tasks assessed/performed, Anxious (emotional)                                   General Comments: pt oriented to name only        General Comments      Exercises     Assessment/Plan    PT Assessment Patient needs continued PT services  PT Problem List Decreased strength;Decreased mobility;Decreased range of motion;Decreased activity tolerance;Decreased balance;Decreased knowledge of use of DME;Decreased coordination       PT Treatment Interventions Therapeutic exercise;DME instruction;Gait training;Balance training;Neuromuscular re-education;Stair training;Functional mobility training;Therapeutic activities;Patient/family education    PT Goals (Current goals can be found in the Care Plan section)  Acute Rehab PT Goals Patient Stated Goal: to go home PT Goal Formulation: With patient Time For Goal Achievement: 05/09/22 Potential to Achieve Goals: Good    Frequency Min 2X/week     Co-evaluation                AM-PAC PT "6 Clicks" Mobility  Outcome Measure Help needed turning from your back to your side while in a flat bed without using bedrails?: A Little Help needed moving from lying on your back to sitting on the side of a flat bed without using bedrails?: A Little Help needed moving to and from a bed to a chair (including a wheelchair)?: A Little Help needed standing up from a chair using your arms (e.g., wheelchair or bedside chair)?: A Little Help needed to walk in hospital room?: A Lot Help needed climbing 3-5 steps with a railing? : Total 6 Click Score: 15    End of Session Equipment Utilized During Treatment: Gait belt Activity Tolerance: Patient tolerated treatment well Patient left: in bed;with call bell/phone within reach;with family/visitor present Nurse Communication: Mobility status PT Visit Diagnosis: Other abnormalities of gait and mobility (R26.89);Difficulty in walking, not elsewhere classified (R26.2);Muscle weakness (generalized) (M62.81)    Time: 5284-1324 PT Time Calculation (min) (ACUTE ONLY): 19 min   Charges:   PT Evaluation $PT Eval Low Complexity: 1 Low PT Treatments $Therapeutic Activity: 8-22 mins       Olga Coaster PT, DPT 12:36 PM,04/25/22

## 2022-04-25 NOTE — Evaluation (Addendum)
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    Depression with anxiety    Leg pain    Chest pain 07/13/2021   Essential hypertension 08/31/2020   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   Peripheral neuropathy 05/03/2020   COPD (chronic obstructive pulmonary disease) (HCC) 11/27/2018   Syncope 07/30/2015   AKI (acute kidney injury) (HCC) 07/30/2015   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   Sleep apnea 07/30/2015   HIV (human immunodeficiency virus infection) (HCC) 07/30/2015   Depression 07/30/2015   BPH (benign prostatic hyperplasia) 07/30/2015   Past Medical History:  Past Medical History:  Diagnosis Date   Arthritis    Asthma    Bradycardia    Depression    HIV (human immunodeficiency virus infection) (HCC)    Hyperlipidemia    Past Surgical History:  Past Surgical History:  Procedure Laterality Date   cataracts     EYE SURGERY     PACEMAKER INSERTION N/A 08/02/2015   Procedure: INSERTION PACEMAKER;  Surgeon: Corky Downs, MD;  Location: [name];  Service: Cardiovascular;  Laterality: N/A;   HPI: Pt is an 81 yo male that presented to ED for L sided weakness and slurred speech. Neurology consult pending, initial note indicates possible posterior circulation versus right hemispheric stroke. PMH includes COPD, HTN, HIV. CTA head and neck: "1. No emergent large vessel occlusion.2. Moderate left P2 PCA stenosis.3. 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 Partially imaged ground-glass opacities in the visualized lung apices, which could represent expiratory changes/atelectasis and/or pneumonia. CT of the chest could further evaluate if  clinically warranted." No MRI d/t pacemaker.     Assessment / Plan / Recommendation Clinical Impression   Patient presents with moderate dysarthria and mild expressive language deficits. Administered portions of the SLUMS (Spanish version) and Western Aphasia Battery- bedside. Pt's daughter-in-law present with pt opting for her to interpret vs assigned interpreter, however testing was administered in Spanish by Firefighter. Patient was 80% intelligible in isolated speech and 50% intelligible at phrase level, worsened when frustrated and emotional. Speech characterized by phrase level output, slurred speech with omission of sounds, frequently omitting final consonants. Patient aware of errors and frequently emotional in response to speech errors, with reduced frustration tolerance. Patient's comprehension appears to be within WNL; Y/N questions 100% accuracy, and 100% accuracy for simple directions; occasionally needed repetition or cues for attention to more complex instructions. When asked to write, Pt was observed to omit final consonants, difficult to determine whether due to language deficit or literacy. Clinician noted several semantic paraphasias(ex. pincel"/desarmador, "bote de sal"/bote de pastilla), and questionable phonemic paraphasias vs articulation errors.  Patient encouraged to increase volume and over articulate words and to continue communicating with family using speech or written modalities. Patient was receptive to SLP suggestions and was able to write at phrase level when frustrated with verbal communication. Patient was highly independent prior to hospitalization and demonstrates potential to make significant gains; recommend CIR consideration. SLP to follow during acute stay to facilitate ability to communicate wants and needs and for ongoing diagnostic intervention.    SLP Assessment  SLP Recommendation/Assessment: Patient needs continued Speech Lanaguage Pathology  Services SLP Visit Diagnosis: Dysarthria and anarthria (  R47.1);Aphasia (R47.01);Cognitive communication deficit (R41.841)    Recommendations for follow up therapy are one component of a multi-disciplinary discharge planning process, led by the attending physician.  Recommendations may be updated based on patient status, additional functional criteria and insurance authorization.    Follow Up Recommendations  Acute inpatient rehab (3hours/day)    Assistance Recommended at Discharge  Frequent or constant Supervision/Assistance  Functional Status Assessment Patient has had a recent decline in their functional status and demonstrates the ability to make significant improvements in function in a reasonable and predictable amount of time.  Frequency and Duration min 2x/week  2 weeks      SLP Evaluation Cognition  Overall Cognitive Status: Difficult to assess Arousal/Alertness: Awake/alert Orientation Level: Oriented to person;Oriented to situation;Oriented to place;Disoriented to time Year: 2023 Day of Week: Incorrect Attention: Sustained Memory: Impaired Memory Impairment: Decreased short term memory Decreased Short Term Memory: Verbal basic (3/5 words with delay) Awareness: Appears intact Problem Solving: Impaired Problem Solving Impairment:  (simple calculation) Behaviors: Lability Safety/Judgment: Appears intact       Comprehension  Auditory Comprehension Overall Auditory Comprehension: Appears within functional limits for tasks assessed Yes/No Questions: Within Functional Limits (10/10) Commands: Within Functional Limits Conversation: Simple Visual Recognition/Discrimination Discrimination: Exceptions to [facility] Reading Comprehension Reading Status: Not tested    Expression Expression Primary Mode of Expression: Verbal Verbal Expression Overall Verbal Expression: Impaired Initiation: Impaired (when upset) Automatic Speech: Name;Social Response (couting interrupted by  lability) Level of Generative/Spontaneous Verbalization: Phrase Repetition: No impairment Naming: Impairment Confrontation: Impaired (18/20) Divergent:  (11 correct animals in 60 seconds (one perseveration)) Other Naming Comments: questionable paraphasias ("pincel"/desarmador, "bote de sal"/bote de pastilla) Verbal Errors: Semantic paraphasias;Perseveration;Aware of errors Pragmatics: No impairment (however, very tearful and often covers face) Non-Verbal Means of Communication: Writing Written Expression Dominant Hand: Right Written Expression: Exceptions to [facility] Self Formulation Ability: Phrase (omits final consonants when writing, question language vs literacy)   Oral / Motor  Oral Motor/Sensory Function Overall Oral Motor/Sensory Function: Mild impairment Facial ROM: Reduced left;Suspected CN VII (facial) dysfunction Facial Symmetry: Abnormal symmetry left;Suspected CN VII (facial) dysfunction Facial Strength: Reduced left;Suspected CN VII (facial) dysfunction Lingual ROM: Within Functional Limits Lingual Symmetry: Within Functional Limits Lingual Strength: Reduced (generalized vs focal) Lingual Sensation: Within Functional Limits Velum: Within Functional Limits Mandible: Within Functional Limits Motor Speech Overall Motor Speech: Impaired Respiration: Within functional limits Phonation: Low vocal intensity Resonance: Within functional limits Articulation: Impaired Level of Impairment: Word Intelligibility: Intelligibility reduced Word: 75-100% accurate (80%) Phrase: 50-74% accurate (repetition needed 50% of the time) Motor Planning: Witnin functional limits            Ludger Nutting, SLP Graduate Student Clinician   I agree with the following treatment note after reviewing documentation.  This session was performed under the supervision of a licensed clinician.   Rondel Baton, MS, CCC-SLP Speech-Language Pathologist   Rondel Baton, [place],  CCC-SLP Speech-Language Pathologist [phone]  Arlana Lindau 04/25/2022, 4:53 PM

## 2022-04-25 NOTE — H&P (Signed)
History and Physical    Brian [name] [id number] DOB: [birth year] DOA: 04/25/2022  Referring MD/NP/PA:   PCP: Remi Haggard, FNP   Patient coming from:  The patient is coming from home.  At baseline, pt is independent for most of ADL.        Chief Complaint: Left-sided weakness, difficulty speaking  HPI: Brian [name] is a 81 y.o. male with medical history significant of s/p for pacemaker placement, hypertension, hyperlipidemia, COPD using as needed oxygen at night, depression with anxiety, HIV, bradycardia, BPH, OSA not using CPAP, who presents with left-sided weakness and difficulty speaking.  Per his daughter-in-law at the bedside, patient was last known normal at about 6 PM yesterday, but patient states that he watched TV at 10-11 PM last night, then he developed left-sided numbness, weakness in both left arm and let leg, difficulty speaking, facial droop.  No hearing loss or vision loss.  Patient does not have chest pain, cough, shortness breath.  No fever or chills.  No nausea vomiting, diarrhea or abdominal pain.  No symptoms of UTI.  Data Reviewed and ED Course: pt was found to have WBC 6.5, INR 1.1, PTT 32, GFR> 60, blood pressure 153/61, heart rate 46, 56, RR 22, oxygen saturation 96% on room air.  CT head negative. CTA of head and neck is negative for LVO, but showed 3 x 8 mm of aortic arch penetrating ulcer. Pt is placed telemetry bed for observation. Dr. Lucky Cowboy of [facility] and Dr. Curly Shores of neurology were consulted.  EKG: I have personally reviewed.  Sinus rhythm, QTc 477, LAD, T wave inversion in the inferior leads, V4-V6, lead II.  Review of Systems:   General: no fevers, chills, no body weight gain, has fatigue HEENT: no blurry vision, hearing changes or sore throat Respiratory: no dyspnea, coughing, wheezing CV: no chest pain, no palpitations GI: no nausea, vomiting, abdominal pain, diarrhea, constipation GU: no dysuria, burning on urination, increased urinary frequency,  hematuria  Ext: no leg edema Neuro: no vision change or hearing loss.  Has left-sided numbness, weakness, facial droop and difficulty speaking Skin: no rash, no skin tear. MSK: No muscle spasm, no deformity, no limitation of range of movement in spin Heme: No easy bruising.  Travel history: No recent long distant travel.   Allergy: No Known Allergies  Past Medical History:  Diagnosis Date   Arthritis    Asthma    Bradycardia    Depression    HIV (human immunodeficiency virus infection) ([place])    Hyperlipidemia     Past Surgical History:  Procedure Laterality Date   cataracts     EYE SURGERY     PACEMAKER INSERTION N/A 08/02/2015   Procedure: INSERTION PACEMAKER;  Surgeon: Cletis Athens, MD;  Location: [name];  Service: Cardiovascular;  Laterality: N/A;    Social History:  reports that he has never smoked. He has never used smokeless tobacco. He reports current alcohol use of about 2.0 standard drinks of alcohol per week. He reports that he does not use drugs.  Family History:  Family History  Problem Relation Age of Onset   Heart disease Brother    Hypertension Other      Prior to Admission medications   Medication Sig Start Date End Date Taking? Authorizing Provider  abacavir-dolutegravir-lamiVUDine (TRIUMEQ) 600-50-300 MG tablet Take 1 tablet by mouth daily. 02/19/22   Tsosie Billing, MD  alendronate (FOSAMAX) 70 MG tablet Take 70 mg by mouth once a week. Take on Saturday. 07/21/15  [provider]  atorvastatin (LIPITOR) 40 MG tablet Take 40 mg by mouth daily.    [provider]  azelastine (OPTIVAR) 0.05 % ophthalmic solution Apply 1 drop to eye 2 (two) times daily. 03/28/21   [provider]  clonazePAM (KLONOPIN) 0.5 MG tablet Take 0.5 mg by mouth 2 (two) times daily as needed for anxiety.    [provider]  DULoxetine (CYMBALTA) 30 MG capsule Take 30 mg by mouth daily. 08/14/20   [provider]  dutasteride  (AVODART) 0.5 MG capsule Take 1 capsule (0.5 mg total) by mouth daily. 10/25/20   Sondra Come, MD  loratadine (CLARITIN) 10 MG tablet Take 10 mg by mouth daily.    [provider]  metoprolol succinate (TOPROL-XL) 25 MG 24 hr tablet TAKE 1 TABLET (25 MG TOTAL) BY MOUTH IN THE MORNING AND AT BEDTIME. 03/11/22   Corky Downs, MD  nystatin-triamcinolone ointment (MYCOLOG) Apply topically 2 (two) times daily. 07/05/21   Sondra Come, MD  omeprazole (PRILOSEC) 20 MG capsule Take 20 mg by mouth daily. 07/21/15   [provider]  pregabalin (LYRICA) 50 MG capsule Take 50 mg by mouth in the morning and at bedtime.    [provider]  tamsulosin (FLOMAX) 0.4 MG CAPS capsule Take 1 capsule (0.4 mg total) by mouth daily. 10/25/20   Sondra Come, MD  traMADol-acetaminophen (ULTRACET) 37.5-325 MG tablet Take 1 tablet by mouth daily as needed. 03/29/21   [provider]  triamcinolone cream (KENALOG) 0.1 % Apply 1 application topically 2 (two) times daily. 06/25/21   [provider]  VENTOLIN HFA 108 (90 BASE) MCG/ACT inhaler Inhale 2 puffs into the lungs 4 (four) times daily as needed. For shortness of breath and/or wheezing. 07/21/15   [provider]    Physical Exam: Vitals:   04/25/22 1000 04/25/22 1100 04/25/22 1200 04/25/22 1319  BP: (!) 155/66 97/79 (!) 151/91 (!) 153/94  Pulse: (!) 40 (!) 45 (!) 50 (!) 52  Resp: (!) 22 19 16 16   Temp:    98.1 F (36.7 C)  TempSrc:    Oral  SpO2: 98% 100% 97% 100%  Weight:       General: Not in acute distress HEENT:       Eyes: PERRL, EOMI, no scleral icterus.       ENT: No discharge from the ears and nose, no pharynx injection, no tonsillar enlargement.        Neck: No JVD, no bruit, no mass felt. Heme: No neck lymph node enlargement. Cardiac: S1/S2, RRR, No murmurs, No gallops or rubs. Respiratory: No rales, wheezing, rhonchi or rubs. GI: Soft, nondistended, nontender, no rebound pain, no  organomegaly, BS present. GU: No hematuria Ext: No pitting leg edema bilaterally. 1+DP/PT pulse bilaterally. Musculoskeletal: No joint deformities, No joint redness or warmth, no limitation of ROM in spin. Skin: No rashes.  Neuro: Alert, oriented X3, cranial nerves II-XII grossly intact, moves all extremities normally. Muscle strength 4/5 in left arm and 1/5 in left leg, sensation to light touch intact. Brachial reflex 2+ bilaterally.  Psych: Patient is not psychotic, no suicidal or hemocidal ideation.  Labs on Admission: I have personally reviewed following labs and imaging studies  CBC: Recent Labs  Lab 04/25/22 0739  WBC 6.5  NEUTROABS 4.8  HGB 13.1  HCT 38.4*  MCV 88.9  PLT 202   Basic Metabolic Panel: Recent Labs  Lab 04/25/22 0739  NA 138  K 3.6  CL  110  CO2 22  GLUCOSE 121*  BUN 11  CREATININE 0.92  CALCIUM 8.7*   GFR: Estimated Creatinine Clearance: 56.8 mL/min (by C-G formula based on SCr of 0.92 mg/dL). Liver Function Tests: Recent Labs  Lab 04/25/22 0739  AST 28  ALT 21  ALKPHOS 42  BILITOT 0.4  PROT 7.2  ALBUMIN 3.5   No results for input(s): "LIPASE", "AMYLASE" in the last 168 hours. No results for input(s): "AMMONIA" in the last 168 hours. Coagulation Profile: Recent Labs  Lab 04/25/22 0739  INR 1.1   Cardiac Enzymes: No results for input(s): "CKTOTAL", "CKMB", "CKMBINDEX", "TROPONINI" in the last 168 hours. BNP (last 3 results) No results for input(s): "PROBNP" in the last 8760 hours. HbA1C: No results for input(s): "HGBA1C" in the last 72 hours. CBG: Recent Labs  Lab 04/25/22 0726  GLUCAP 116*   Lipid Profile: No results for input(s): "CHOL", "HDL", "LDLCALC", "TRIG", "CHOLHDL", "LDLDIRECT" in the last 72 hours. Thyroid Function Tests: No results for input(s): "TSH", "T4TOTAL", "FREET4", "T3FREE", "THYROIDAB" in the last 72 hours. Anemia Panel: No results for input(s): "VITAMINB12", "FOLATE", "FERRITIN", "TIBC", "IRON",  "RETICCTPCT" in the last 72 hours. Urine analysis:    Component Value Date/Time   COLORURINE YELLOW 07/15/2021 0520   APPEARANCEUR CLEAR 07/15/2021 0520   APPEARANCEUR Cloudy (A) 10/25/2020 1303   LABSPEC 1.025 07/15/2021 0520   LABSPEC 1.015 11/09/2012 1958   PHURINE 5.5 07/15/2021 0520   GLUCOSEU NEGATIVE 07/15/2021 0520   GLUCOSEU Negative 11/09/2012 1958   HGBUR LARGE (A) 07/15/2021 0520   BILIRUBINUR NEGATIVE 07/15/2021 0520   BILIRUBINUR Negative 10/25/2020 1303   BILIRUBINUR Negative 11/09/2012 1958   KETONESUR TRACE (A) 07/15/2021 0520   PROTEINUR 100 (A) 07/15/2021 0520   NITRITE POSITIVE (A) 07/15/2021 0520   LEUKOCYTESUR SMALL (A) 07/15/2021 0520   LEUKOCYTESUR 3+ 11/09/2012 1958   Sepsis Labs: @LABRCNTIP (procalcitonin:4,lacticidven:4) )No results found for this or any previous visit (from the past 240 hour(s)).   Radiological Exams on Admission: CT ANGIO HEAD NECK W WO CM W PERF (CODE STROKE)  Result Date: 04/25/2022 CLINICAL DATA:  Neuro deficit, acute, stroke suspected left sided weakness EXAM: CT ANGIOGRAPHY HEAD AND NECK TECHNIQUE: Multidetector CT imaging of the head and neck was performed using the standard protocol during bolus administration of intravenous contrast. Multiplanar CT image reconstructions and MIPs were obtained to evaluate the vascular anatomy. Carotid stenosis measurements (when applicable) are obtained utilizing NASCET criteria, using the distal internal carotid diameter as the denominator. RADIATION DOSE REDUCTION: This exam was performed according to the departmental dose-optimization program which includes automated exposure control, adjustment of the mA and/or kV according to patient size and/or use of iterative reconstruction technique. CONTRAST:  04/27/2022 OMNIPAQUE IOHEXOL 350 MG/ML SOLN COMPARISON:  Same day head CT. FINDINGS: CTA NECK FINDINGS Aortic arch: Ulcerated atherosclerosis of the aorta with approximately 3 x 8 mm penetrating ulcer along  the aortic arch. Great vessel origins are patent without significant stenosis. Right carotid system: Atherosclerosis at the carotid bifurcation without greater than 50% stenosis. Left carotid system: Atherosclerosis at the carotid bifurcation without greater than 50% stenosis. Vertebral arteries: Left dominant. Both vertebral arteries are patent without significant (greater than 50%) stenosis. Skeleton: Severe multilevel degenerative change in the visualized spine. Other neck: No acute abnormality. Upper chest: Partially imaged ground-glass opacities in the visualized lung apices. Review of the MIP images confirms the above findings CTA HEAD FINDINGS Anterior circulation: Bilateral intracranial ICAs, MCAs, and ACAs are patent without proximal hemodynamically significant stenosis. Diminutive  right A1 ACA and right carotid terminus, probably congenital/anatomic variant given prominent left A1 ACA and carotid terminus. Mild ICA atherosclerosis. Posterior circulation: Non dominant/small right vertebral artery appears to largely terminate as PICA, anatomic variant. Left intradural vertebral artery is prominent and patent. Basilar artery and bilateral posterior cerebral arteries are patent. Moderate left P2 PCA stenosis. Venous sinuses: As permitted by contrast timing, patent. Anatomic variants: Detailed above. Review of the MIP images confirms the above findings IMPRESSION: 1. No emergent large vessel occlusion. 2. Moderate left P2 PCA stenosis. 3. 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 4. Partially imaged ground-glass opacities in the visualized lung apices, which could represent expiratory changes/atelectasis and/or pneumonia. CT of the chest could further evaluate if clinically warranted. Electronically Signed   By: Margaretha Sheffield M.D.   On: 04/25/2022 08:51   CT HEAD CODE STROKE WO CONTRAST  Addendum Date: 04/25/2022   ADDENDUM REPORT: 04/25/2022 07:54 ADDENDUM: Study  discussed by telephone with Dr. Doren Custard STAFFORD on 04/25/2022 at 0744 hours. Electronically Signed   By: Genevie Ann M.D.   On: 04/25/2022 07:54   Result Date: 04/25/2022 CLINICAL DATA:  Code stroke.  81 year old male EXAM: CT HEAD WITHOUT CONTRAST TECHNIQUE: Contiguous axial images were obtained from the base of the skull through the vertex without intravenous contrast. RADIATION DOSE REDUCTION: This exam was performed according to the departmental dose-optimization program which includes automated exposure control, adjustment of the mA and/or kV according to patient size and/or use of iterative reconstruction technique. COMPARISON:  Head CT 01/12/2020 FINDINGS: Brain: Mild motion artifact. No midline shift, mass effect, or evidence of intracranial mass lesion. No ventriculomegaly. No acute intracranial hemorrhage identified. Gray-white matter differentiation appears largely stable since 2021 and is largely normal for age. However, there are small chronic appearing lacunar infarcts of the left caudate (series 3, image 16) and left corona radiata (new, image 18). No cortically based acute infarct identified. Vascular: Calcified atherosclerosis at the skull base. No suspicious intracranial vascular hyperdensity. Dominant left vertebral artery. Skull: No acute osseous abnormality identified. Sinuses/Orbits: Bubbly opacity now along with mucosal thickening in the visible right maxillary sinus. Similar new bubbly opacity bilateral sphenoid sinuses. Other Visualized paranasal sinuses and mastoids are clear. Other: Benign left posterior convexity scalp lipoma series 4, image 40. No acute orbit or scalp soft tissue finding identified. ASPECTS [facility] Stroke Program Early CT Score) Total score (0-10 with 10 being normal): 10 IMPRESSION: 1. No acute cortically based infarct or acute intracranial hemorrhage identified. ASPECTS 10. 2. Mild small vessel disease for age, although appears progressed since 2021. Electronically  Signed: By: Genevie Ann M.D. 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  CT head negative.  CT angiogram of head and neck is negative for LVO.  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  Consulted Dr. Curly Shores of neuro.  - will admit to tele bed as inpt - will hold oral Bp meds to allow permissive HTN in the setting of acute stroke, for SBP>220 or dBP>110 - ASA and lipitor - fasting lipid panel and HbA1c  -  2D transthoracic echocardiography  - swallowing screen. 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 Lucky Cowboy of vascular surgery. Per, Dr. Lucky Cowboy, patient does not need any intervention now.  He needs to be followed up.  If he needs to be treated in the future, it would require cardiothoracic surgery but at his age, surgical treatment is less likely. It is OK to use ASA and sq Lovenox PPX per Dr. Lucky Cowboy.      Hyperlipidemia -Lipitor  Essential hypertension Hold home blood pressure medications, metoprolol -IV hydralazine as needed for SBP> 220 and DBP> 110  COPD (chronic obstructive pulmonary disease) ([facility]) Stable -Bronchodilators             DVT ppx: SQ Lovenox  Code Status: DNR per her daughter-in-law  Family Communication:  Yes, patient's daughter-in-law at the bedside    Disposition Plan:  Anticipate discharge back to previous environment  Consults called: Dr. Lucky Cowboy of [facility] and Dr. Curly Shores of neurology  Admission status and Level of care: Telemetry Medical:   for obs    Severity of Illness:  The appropriate patient status for this patient is INPATIENT. Inpatient status is judged to be reasonable and necessary in order to provide the required intensity of service to ensure the patient's safety. The patient's presenting symptoms, physical exam findings, and initial radiographic and laboratory data in the context of their chronic comorbidities is felt to place them at high risk for further clinical deterioration. Furthermore, it is not anticipated that the patient will be medically stable for discharge from the hospital within 2 midnights of admission.   * I certify that at the point of admission it is my clinical judgment that the patient will require inpatient hospital care spanning beyond 2 midnights from the point of admission due to high intensity of service, high risk for further deterioration and high frequency of surveillance required.*     Date of Service 04/25/2022    Ivor Costa [facility]   If 7PM-7AM, please contact night-coverage [email or web address] 04/25/2022, 2:27 PM

## 2022-04-25 NOTE — Assessment & Plan Note (Signed)
Lipitor 

## 2022-04-25 NOTE — Assessment & Plan Note (Addendum)
Consulted Dr. Wyn Quaker of vascular surgery. Per, Dr. Wyn Quaker, patient does not need any intervention now.  He needs to be followed up.  If he needs to be treated in the future, it would require cardiothoracic surgery but at his age, surgical treatment is less likely. It is OK to use ASA and sq Lovenox PPX per Dr. Wyn Quaker.

## 2022-04-26 DIAGNOSIS — J449 Chronic obstructive pulmonary disease, unspecified: Secondary | ICD-10-CM | POA: Diagnosis not present

## 2022-04-26 DIAGNOSIS — I6312 Cerebral infarction due to embolism of basilar artery: Secondary | ICD-10-CM | POA: Diagnosis not present

## 2022-04-26 DIAGNOSIS — I719 Aortic aneurysm of unspecified site, without rupture: Secondary | ICD-10-CM | POA: Diagnosis not present

## 2022-04-26 DIAGNOSIS — I639 Cerebral infarction, unspecified: Secondary | ICD-10-CM | POA: Diagnosis not present

## 2022-04-26 LAB — LIPID PANEL
Cholesterol: 149 mg/dL (ref 0–200)
HDL: 37 mg/dL — ABNORMAL LOW (ref >40)
LDL Cholesterol: 86 mg/dL (ref 0–99)
Total CHOL/HDL Ratio: 4 RATIO
Triglycerides: 131 mg/dL (ref <150)
VLDL: 26 mg/dL (ref 0–40)

## 2022-04-26 MED ORDER — ATORVASTATIN CALCIUM 20 MG PO TABS: 80.0000 mg | ORAL_TABLET | Freq: Every day | ORAL | Status: DC

## 2022-04-26 MED ORDER — ATORVASTATIN CALCIUM 20 MG PO TABS
40.0000 mg | ORAL_TABLET | Freq: Every day | ORAL | Status: DC
  Administered 2022-04-26 – 2022-05-03 (×8): 40 mg via ORAL
  Filled 2022-04-26 (×8): qty 2

## 2022-04-26 MED ORDER — CLOPIDOGREL BISULFATE 75 MG PO TABS
300.0000 mg | ORAL_TABLET | Freq: Once | ORAL | Status: AC
Start: 2022-04-26 — End: 2022-04-26
  Administered 2022-04-26: 300 mg via ORAL
  Filled 2022-04-26: qty 4

## 2022-04-26 MED ORDER — CLOPIDOGREL BISULFATE 75 MG PO TABS
75.0000 mg | ORAL_TABLET | Freq: Every day | ORAL | Status: DC
  Administered 2022-04-27 – 2022-05-04 (×8): 75 mg via ORAL
  Filled 2022-04-26 (×8): qty 1

## 2022-04-26 NOTE — Progress Notes (Signed)
   04/26/22 1000  Clinical Encounter Type  Visited With Patient and family together  Visit Type Follow-up  Referral From Care management  Consult/Referral To Chaplain   Chaplain responded to consult to provide information for Advance Directive. Not completed at this time but family will call when done.

## 2022-04-26 NOTE — Progress Notes (Signed)
Inpatient Rehab Admissions Coordinator Note:   Per updated therapy recommendations patient was screened for CIR candidacy by Stephania Fragmin, PT. At this time, pt appears to be a potential candidate for CIR. I will place an order for rehab consult for full assessment, per our protocol.  Please contact me any with questions.Estill Dooms, PT, DPT [phone] 04/26/22 3:04 PM

## 2022-04-26 NOTE — Hospital Course (Addendum)
Brian [name] is a 81 y.o. male with medical history significant of s/p for pacemaker placement, hypertension, hyperlipidemia, COPD using as needed oxygen at night, depression with anxiety, HIV, bradycardia, BPH, OSA not using CPAP, who presents with left-sided weakness and difficulty speaking. Patient is a seen by neurology, symptoms consistent with stroke.  CT angiogram of the neck and head is negative for major occlusion. Could not do MRI due to pacemaker.  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  Patient does not need surgery per vascular surgery.  Also cleared to give Plavix 300 mg loading dose followed by 75 mg daily. Echocardiogram showed ejection fraction 55 to 60%, with grade 2 diastolic dysfunction. Patient is also evaluated by PT/OT, recommended nursing home placement.

## 2022-04-26 NOTE — Progress Notes (Signed)
  Progress Note   Patient: Brian [name] VZD:[id number] DOB: 05-11-1941 DOA: 04/25/2022     1 DOS: the patient was seen and examined on 04/26/2022   Brief hospital course: Brian [name] is a 81 y.o. male with medical history significant of s/p for pacemaker placement, hypertension, hyperlipidemia, COPD using as needed oxygen at night, depression with anxiety, HIV, bradycardia, BPH, OSA not using CPAP, who presents with left-sided weakness and difficulty speaking. Patient is a seen by neurology, symptoms consistent with stroke.  CT angiogram of the neck and head is negative for major occlusion. Could not do MRI due to pacemaker.  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  Patient does not need surgery per vascular surgery.  Also cleared to give Plavix 300 mg loading dose followed by 75 mg daily. Echocardiogram showed ejection fraction 55 to 60%, with grade 2 diastolic dysfunction. Patient is also evaluated by PT/OT, recommended nursing home placement.  Assessment and Plan: Acute stroke. Essential hypertension. 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 Patient is evaluated by neurology, acute stroke appears to be caused by embolism from aortic arch. Continue aspirin and Plavix after loading dose of Plavix.  Continue statin.  HIV. Continue home medicines, follow-up with PCP.  COPD. Condition stable.      Subjective:  Patient complaining some right leg spasm, otherwise doing well.  Physical Exam: Vitals:   04/25/22 2100 04/26/22 0033 04/26/22 0557 04/26/22 0912  BP:  (!) 175/92 (!) 159/78 (!) 166/95  Pulse:  (!) 51 (!) 51 (!) 49  Resp:  (!) 22 16 18   Temp: 98 F (36.7 C) 98.7 F (37.1 C)  97.6 F (36.4 C)  TempSrc: Oral Oral    SpO2:  96% 97% 98%  Weight:      Height:       General exam: Appears calm and comfortable  Respiratory system: Clear to auscultation. Respiratory effort normal. Cardiovascular system: S1 & S2 heard,  RRR. No JVD, murmurs, rubs, gallops or clicks. No pedal edema. Gastrointestinal system: Abdomen is nondistended, soft and nontender. No organomegaly or masses felt. Normal bowel sounds heard. Central nervous system: Alert and oriented. No focal neurological deficits. Extremities: Symmetric 5 x 5 power. Skin: No rashes, lesions or ulcers Psychiatry: Judgement and insight appear normal. Mood & affect appropriate.   Data Reviewed:  Reviewed CT results, lab results.  Family Communication: Daughter-in-law at the bedside, updated  Disposition: Status is: Inpatient Remains inpatient appropriate because: Severity of disease.  Planned Discharge Destination: Skilled nursing facility    Time spent: 35 minutes  Author: , MD 04/26/2022 1:09 PM  For on call review [email or web address]/28/[email or web address].

## 2022-04-26 NOTE — Plan of Care (Signed)
  Problem: Education: Goal: Knowledge of General Education information will improve Description: Including pain rating scale, medication(s)/side effects and non-pharmacologic comfort measures Outcome: Progressing   Problem: Health Behavior/Discharge Planning: Goal: Ability to manage health-related needs will improve Outcome: Progressing   Problem: Clinical Measurements: Goal: Ability to maintain clinical measurements within normal limits will improve Outcome: Progressing Goal: Will remain free from infection Outcome: Progressing Goal: Diagnostic test results will improve Outcome: Progressing Goal: Respiratory complications will improve Outcome: Progressing Goal: Cardiovascular complication will be avoided Outcome: Progressing   Problem: Activity: Goal: Risk for activity intolerance will decrease Outcome: Progressing   Problem: Nutrition: Goal: Adequate nutrition will be maintained Outcome: Progressing   Problem: Coping: Goal: Level of anxiety will decrease Outcome: Progressing   Problem: Elimination: Goal: Will not experience complications related to bowel motility Outcome: Progressing Goal: Will not experience complications related to urinary retention Outcome: Progressing   Problem: Pain Managment: Goal: General experience of comfort will improve Outcome: Progressing   Problem: Safety: Goal: Ability to remain free from injury will improve Outcome: Progressing   Problem: Skin Integrity: Goal: Risk for impaired skin integrity will decrease Outcome: Progressing   Problem: Education: Goal: Knowledge of disease or condition will improve Outcome: Progressing Goal: Knowledge of secondary prevention will improve (SELECT ALL) Outcome: Progressing Goal: Knowledge of patient specific risk factors will improve (INDIVIDUALIZE FOR PATIENT) Outcome: Progressing   Problem: Coping: Goal: Will verbalize positive feelings about self Outcome: Progressing Goal: Will  identify appropriate support needs Outcome: Progressing   Problem: Health Behavior/Discharge Planning: Goal: Ability to manage health-related needs will improve Outcome: Progressing   Problem: Self-Care: Goal: Ability to participate in self-care as condition permits will improve Outcome: Progressing Goal: Verbalization of feelings and concerns over difficulty with self-care will improve Outcome: Progressing Goal: Ability to communicate needs accurately will improve Outcome: Progressing

## 2022-04-26 NOTE — Progress Notes (Signed)
Occupational Therapy Treatment Patient Details Name: Brian [name] MRN: [id number] DOB: [birth year] Today's Date: 04/26/2022   History of present illness Pt is an 81 yo male that presented to ED for L sided weakness and slurred speech. 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 PMH of COPD, HTN, HIV.   OT comments  Chart reviewed, RN cleared pt for participation in OT tx session. Tx session targeted progressing independence in ADL, functional mobility to facilitate return to PLOF. Pt is requesting to amb to bathroom for BM. MIN-MOD A required for ambulation approx 15' from bed to bathroom with step by step vcs for safe technique. LUE supported throughout. Pt reaching outwardly with RUE throughout for stabilization. Toilet transfer completed with MIN A, toileting completed with MOD A for STS. Grooming tasks completed at sink level with MIN A due to decreased dynamic standing balance. Education provided re: CIR vs SNF, pt daugther in law reports they can provide assist as needed after discharge. Pt was MOD I-I in ADL/IADL PTA would benefit from intensive rehab to facilitate return to PLOF. Pt is left as received, NAD, all needs met. OT will follow acutely.    Recommendations for follow up therapy are one component of a multi-disciplinary discharge planning process, led by the attending physician.  Recommendations may be updated based on patient status, additional functional criteria and insurance authorization.    Follow Up Recommendations  Acute inpatient rehab (3hours/day)    Assistance Recommended at Discharge Frequent or constant Supervision/Assistance  Patient can return home with the following  A lot of help with walking and/or transfers;A lot of help with bathing/dressing/bathroom;Assistance with cooking/housework;Direct supervision/assist for medications management;Help with stairs or ramp for entrance;Assist  for transportation;Direct supervision/assist for financial management   Equipment Recommendations    Per next venue of care    Recommendations for Other Services      Precautions / Restrictions Precautions Precautions: Fall Restrictions Weight Bearing Restrictions: No       Mobility Bed Mobility Overal bed mobility: Needs Assistance Bed Mobility: Supine to Sit, Sit to Supine     Supine to sit: Min assist, HOB elevated Sit to supine: Min assist, HOB elevated        Transfers Overall transfer level: Needs assistance Equipment used: 1 person hand held assist Transfers: Sit to/from Stand Sit to Stand: Min assist, Mod assist                 Balance Overall balance assessment: Needs assistance Sitting-balance support: Feet supported Sitting balance-Leahy Scale: Fair Sitting balance - Comments: posterior lean, step by step vcs for upright sitting during static sitting tasks Postural control: Posterior lean Standing balance support: During functional activity, Single extremity supported Standing balance-Leahy Scale: Poor                             ADL either performed or assessed with clinical judgement   ADL Overall ADL's : Needs assistance/impaired     Grooming: Wash/dry hands;Minimal assistance;Standing Grooming Details (indicate cue type and reason): sink level             Lower Body Dressing: Moderate assistance;Sit to/from stand Lower Body Dressing Details (indicate cue type and reason): pants Toilet Transfer: Minimal assistance;Ambulation;Cueing for safety;Cueing for sequencing   Toileting- Clothing Manipulation and Hygiene: Moderate assistance;Sit to/from stand Toileting - Clothing Manipulation Details (indicate cue type and reason): following BM  Functional mobility during ADLs: Minimal assistance;Moderate assistance (reaching outwardly for objects for support with step by step vcs from therapist for safe amb)         Cognition Arousal/Alertness: Awake/alert Behavior During Therapy: WFL for tasks assessed/performed, Anxious Overall Cognitive Status: Impaired/Different from baseline Area of Impairment: Safety/judgement, Awareness, Problem solving                         Safety/Judgement: Decreased awareness of safety, Decreased awareness of deficits Awareness: Emergent Problem Solving: Decreased initiation, Difficulty sequencing, Requires verbal cues, Requires tactile cues          Exercises Other Exercises Other Exercises: edu pt and daugther re: role of rehab, CIR vs SNF level of care, safe ADL completion, importance of progressing mobility       General Comments vss throughout    Pertinent Vitals/ Pain       Pain Assessment Pain Assessment: No/denies pain  Home Living                                          Prior Functioning/Environment              Frequency  Min 4X/week        Progress Toward Goals  OT Goals(current goals can now be found in the care plan section)  Progress towards OT goals: Progressing toward goals     Plan Discharge plan needs to be updated;Frequency needs to be updated    Co-evaluation                 AM-PAC OT "6 Clicks" Daily Activity     Outcome Measure   Help from another person eating meals?: A Little Help from another person taking care of personal grooming?: A Little Help from another person toileting, which includes using toliet, bedpan, or urinal?: A Lot Help from another person bathing (including washing, rinsing, drying)?: A Lot Help from another person to put on and taking off regular upper body clothing?: A Little Help from another person to put on and taking off regular lower body clothing?: A Lot 6 Click Score: 15    End of Session Equipment Utilized During Treatment: Gait belt  OT Visit Diagnosis: Unsteadiness on feet (R26.81);Repeated falls (R29.6);Muscle weakness (generalized)  (M62.81);Other symptoms and signs involving the nervous system (R29.898)   Activity Tolerance Patient tolerated treatment well   Patient Left in bed;with call bell/phone within reach;with bed alarm set;with family/visitor present   Nurse Communication Mobility status        Time: 1355-1417 OT Time Calculation (min): 22 min  Charges: OT General Charges $OT Visit: 1 Visit OT Treatments $Self Care/Home Management : 8-22 mins  Shanon Payor, OTD OTR/L  04/26/22, 2:47 PM

## 2022-04-26 NOTE — Consult Note (Signed)
[facility] Vascular Consult Note  MRN : [id number]  Brian [name] is a 81 y.o. (1941-05-16) male who presents with chief complaint of  Chief Complaint  Patient presents with   Code Stroke  .   Consulting Physician:Niu Brien Few, MD Reason for consult: History of Present Illness: The patient is an 81 year old male with a past medical history significant for hypertension, hyperlipidemia, COPD, HIV who presented to [facility] who presented with left-sided weakness and difficulty speaking.  The family provides much of the history and notes that he began to have symptoms around 10-11 o'clock last night.  In the midst of work-up CTA head and neck were negative for any acute vascular findings.  However it was noted that there was some atherosclerotic ulceration of the aortic arch.  CT scan independently reviewed by myself and Dr. Wyn Quaker notes that the ulcerative area is relatively small.  Current Facility-Administered Medications  Medication Dose Route Frequency Provider Last Rate Last Admin    stroke: early stages of recovery book   Does not apply Once Lorretta Harp, MD       abacavir-dolutegravir-lamiVUDine (TRIUMEQ) 600-50-300 MG per tablet 1 tablet  1 tablet Oral Daily Lorretta Harp, MD       acetaminophen (TYLENOL) tablet 650 mg  650 mg Oral Q4H PRN Lorretta Harp, MD       Or   acetaminophen (TYLENOL) 160 MG/5ML solution 650 mg  650 mg Per Tube Q4H PRN Lorretta Harp, MD       Or   acetaminophen (TYLENOL) suppository 650 mg  650 mg Rectal Q4H PRN Lorretta Harp, MD       traMADol Janean Sark) tablet 50 mg  50 mg Oral Daily PRN Lowella Bandy, RPH   50 mg at 04/25/22 2334   And   acetaminophen (TYLENOL) tablet 325 mg  325 mg Oral Daily PRN Lowella Bandy, RPH       albuterol (PROVENTIL) (2.5 MG/3ML) 0.083% nebulizer solution 2.5 mg  2.5 mg Inhalation Q4H PRN Lorretta Harp, MD       aspirin chewable tablet 324 mg  324 mg Oral Daily Lorretta Harp, MD       atorvastatin  (LIPITOR) tablet 40 mg  40 mg Oral Daily Lorretta Harp, MD   40 mg at 04/25/22 2334   clonazePAM (KLONOPIN) tablet 0.5 mg  0.5 mg Oral BID PRN Lorretta Harp, MD   0.5 mg at 04/25/22 2334   dextromethorphan-guaiFENesin (MUCINEX DM) 30-600 MG per 12 hr tablet 1 tablet  1 tablet Oral BID PRN Lorretta Harp, MD       DULoxetine (CYMBALTA) DR capsule 30 mg  30 mg Oral Daily Lorretta Harp, MD   30 mg at 04/25/22 2334   dutasteride (AVODART) capsule 0.5 mg  0.5 mg Oral Daily Lorretta Harp, MD   0.5 mg at 04/25/22 2340   enoxaparin (LOVENOX) injection 40 mg  40 mg Subcutaneous Q24H Lorretta Harp, MD   40 mg at 04/25/22 2335   hydrALAZINE (APRESOLINE) injection 5 mg  5 mg Intravenous Q2H PRN Lorretta Harp, MD       ondansetron [facility]) injection 4 mg  4 mg Intravenous Q8H PRN Lorretta Harp, MD       pantoprazole (PROTONIX) EC tablet 40 mg  40 mg Oral Daily Lorretta Harp, MD   40 mg at 04/25/22 2335   pregabalin (LYRICA) capsule 50 mg  50 mg Oral Marice Potter, MD   50 mg at 04/25/22 2335  senna-docusate (Senokot-S) tablet 1 tablet  1 tablet Oral QHS PRN Lorretta Harp, MD       tamsulosin [facility]) capsule 0.4 mg  0.4 mg Oral Daily Lorretta Harp, MD   0.4 mg at 04/25/22 2335    Past Medical History:  Diagnosis Date   Arthritis    Asthma    Bradycardia    Depression    HIV (human immunodeficiency virus infection) (HCC)    Hyperlipidemia     Past Surgical History:  Procedure Laterality Date   cataracts     EYE SURGERY     PACEMAKER INSERTION N/A 08/02/2015   Procedure: INSERTION PACEMAKER;  Surgeon: Corky Downs, MD;  Location: [name];  Service: Cardiovascular;  Laterality: N/A;    Social History Social History   Tobacco Use   Smoking status: Never   Smokeless tobacco: Never  Vaping Use   Vaping Use: Never used  Substance Use Topics   Alcohol use: Yes    Alcohol/week: 2.0 standard drinks of alcohol    Types: 2 Cans of beer per week    Comment: 1-2 drinks a day   Drug use: No    Family History Family History   Problem Relation Age of Onset   Heart disease Brother    Hypertension Other     No Known Allergies   REVIEW OF SYSTEMS (Negative unless checked)  Constitutional: [] Weight loss  [] Fever  [] Chills Cardiac: [] Chest pain   [] Chest pressure   [] Palpitations   [] Shortness of breath when laying flat   [] Shortness of breath at rest   [] Shortness of breath with exertion. Vascular:  [] Pain in legs with walking   [] Pain in legs at rest   [] Pain in legs when laying flat   [] Claudication   [] Pain in feet when walking  [] Pain in feet at rest  [] Pain in feet when laying flat   [] History of DVT   [] Phlebitis   [] Swelling in legs   [] Varicose veins   [] Non-healing ulcers Pulmonary:   [] Uses home oxygen   [] Productive cough   [] Hemoptysis   [] Wheeze  [] COPD   [] Asthma Neurologic:  [] Dizziness  [] Blackouts   [] Seizures   [] History of stroke   [] History of TIA  [] Aphasia   [] Temporary blindness   [] Dysphagia   [x] Weakness or numbness in arms   [x] Weakness or numbness in legs Musculoskeletal:  [] Arthritis   [] Joint swelling   [] Joint pain   [] Low back pain Hematologic:  [] Easy bruising  [] Easy bleeding   [] Hypercoagulable state   [] Anemic  [] Hepatitis Gastrointestinal:  [] Blood in stool   [] Vomiting blood  [] Gastroesophageal reflux/heartburn   [] Difficulty swallowing. Genitourinary:  [] Chronic kidney disease   [] Difficult urination  [] Frequent urination  [] Burning with urination   [] Blood in urine Skin:  [] Rashes   [] Ulcers   [] Wounds Psychological:  [] History of anxiety   []  History of major depression.  Physical Examination  Vitals:   04/25/22 1627 04/25/22 2029 04/25/22 2100 04/26/22 0033  BP: (!) 163/57 (!) 174/88  (!) 175/92  Pulse: (!) 55 (!) 57  (!) 51  Resp: 16 17  (!) 22  Temp: 98.7 F (37.1 C) (!) 100.6 F (38.1 C) 98 F (36.7 C) 98.7 F (37.1 C)  TempSrc:   Oral Oral  SpO2: 95% 99%  96%  Weight:      Height:       Body mass index is 27.04 kg/m. Gen:  WD/WN, NAD Head: [place]/AT, No  temporalis wasting. Prominent temp pulse not noted. Ear/Nose/Throat:  Hearing grossly intact, nares w/o erythema or drainage, oropharynx w/o Erythema/Exudate Eyes: Sclera non-icteric, conjunctiva clear Neck: Trachea midline.  No JVD.  Pulmonary:  Good air movement, respirations not labored, equal bilaterally.  Cardiac: RRR, normal S1, S2. Vascular: Minimal edema PT Palpable Palpable  DP Palpable Palpable   Gastrointestinal: soft, non-tender/non-distended. No guarding/reflex.  Musculoskeletal: M/S 5/5 throughout.  Extremities without ischemic changes.  No deformity or atrophy. No edema. Neurologic: Decreased muscle strength in left upper and lower extremity Psychiatric: Judgment intact, Mood & affect appropriate for pt's clinical situation. Dermatologic: No rashes or ulcers noted.  No cellulitis or open wounds. Lymph : No Cervical, Axillary, or Inguinal lymphadenopathy.    CBC Lab Results  Component Value Date   WBC 6.5 04/25/2022   HGB 13.1 04/25/2022   HCT 38.4 (L) 04/25/2022   MCV 88.9 04/25/2022   PLT 202 04/25/2022    BMET    Component Value Date/Time   NA 138 04/25/2022 0739   NA 138 10/16/2014 0405   K 3.6 04/25/2022 0739   K 4.1 10/16/2014 0405   CL 110 04/25/2022 0739   CL 105 10/16/2014 0405   CO2 22 04/25/2022 0739   CO2 27 10/16/2014 0405   GLUCOSE 121 (H) 04/25/2022 0739   GLUCOSE 86 10/16/2014 0405   BUN 11 04/25/2022 0739   BUN 14 10/16/2014 0405   CREATININE 0.92 04/25/2022 0739   CREATININE 1.16 10/16/2014 0405   CALCIUM 8.7 (L) 04/25/2022 0739   CALCIUM 8.8 10/16/2014 0405   GFRNONAA >60 04/25/2022 0739   GFRNONAA >60 10/16/2014 0405   GFRNONAA >60 11/09/2012 1900   GFRAA >60 02/03/2020 1118   GFRAA >60 10/16/2014 0405   GFRAA >60 11/09/2012 1900   Estimated Creatinine Clearance: 56.8 mL/min (by C-G formula based on SCr of 0.92 mg/dL).  COAG Lab Results  Component Value Date   INR 1.1 04/25/2022   INR 1.11 08/02/2015   INR 1.0 10/15/2014     Radiology ECHOCARDIOGRAM COMPLETE  Result Date: 04/25/2022    ECHOCARDIOGRAM REPORT   Patient Name:   GARNELL PHENIX Date of Exam: 04/25/2022 Medical Rec #:  [id number]    Height:       66.0 in Accession #:    [id number]   Weight:       167.5 lb Date of Birth:  [birth year]     BSA:          1.855 m Patient Age:    81 years     BP:           151/91 mmHg Patient Gender: M            HR:           50 bpm. Exam Location:  [facility] Procedure: 2D Echo, Cardiac Doppler and Color Doppler Indications:     Stroke I63.9  History:         Patient has prior history of Echocardiogram examinations, most                  recent 07/14/2021. Risk Factors:Dyslipidemia. Bradycardia.  Sonographer:     Cristela Blue Referring Phys:  [id number] Brien Few NIU Diagnosing Phys: Debbe Odea MD  Sonographer Comments: Suboptimal apical window. IMPRESSIONS  1. Left ventricular ejection fraction, by estimation, is 55 to 60%. The left ventricle has normal function. The left ventricle has no regional wall motion abnormalities. There is mild left ventricular hypertrophy. Left ventricular diastolic parameters are consistent with Grade II diastolic dysfunction (pseudonormalization).  2. Right  ventricular systolic function is normal. The right ventricular size is normal.  3. Left atrial size was mildly dilated.  4. The mitral valve is normal in structure. No evidence of mitral valve regurgitation.  5. The aortic valve is grossly normal. Aortic valve regurgitation is not visualized.  6. Aortic dilatation noted. There is mild dilatation of the aortic root, measuring 39 mm.  7. The inferior vena cava is normal in size with <50% respiratory variability, suggesting right atrial pressure of 8 mmHg. FINDINGS  Left Ventricle: Left ventricular ejection fraction, by estimation, is 55 to 60%. The left ventricle has normal function. The left ventricle has no regional wall motion abnormalities. The left ventricular internal cavity size was normal in size. There is  mild  left ventricular hypertrophy. Left ventricular diastolic parameters are consistent with Grade II diastolic dysfunction (pseudonormalization). Right Ventricle: The right ventricular size is normal. No increase in right ventricular wall thickness. Right ventricular systolic function is normal. Left Atrium: Left atrial size was mildly dilated. Right Atrium: Right atrial size was normal in size. Pericardium: There is no evidence of pericardial effusion. Mitral Valve: The mitral valve is normal in structure. No evidence of mitral valve regurgitation. Tricuspid Valve: The tricuspid valve is normal in structure. Tricuspid valve regurgitation is not demonstrated. Aortic Valve: The aortic valve is grossly normal. Aortic valve regurgitation is not visualized. Aortic valve mean gradient measures 2.5 mmHg. Aortic valve peak gradient measures 4.6 mmHg. Aortic valve area, by VTI measures 2.36 cm. Pulmonic Valve: The pulmonic valve was normal in structure. Pulmonic valve regurgitation is not visualized. Aorta: Aortic dilatation noted. There is mild dilatation of the aortic root, measuring 39 mm. Venous: The inferior vena cava is normal in size with less than 50% respiratory variability, suggesting right atrial pressure of 8 mmHg. IAS/Shunts: No atrial level shunt detected by color flow Doppler.  LEFT VENTRICLE PLAX 2D LVIDd:         4.40 cm   Diastology LVIDs:         3.00 cm   LV e' medial:    4.35 cm/s LV PW:         1.40 cm   LV E/e' medial:  22.6 LV IVS:        1.20 cm   LV e' lateral:   4.35 cm/s LVOT diam:     2.10 cm   LV E/e' lateral: 22.6 LV SV:         56 LV SV Index:   30 LVOT Area:     3.46 cm  RIGHT VENTRICLE RV Basal diam:  3.30 cm RV S prime:     11.90 cm/s TAPSE (M-mode): 2.3 cm LEFT ATRIUM             Index        RIGHT ATRIUM           Index LA diam:        3.60 cm 1.94 cm/m   RA Area:     16.00 cm LA Vol (A2C):   60.9 ml 32.83 ml/m  RA Volume:   37.90 ml  20.43 ml/m LA Vol (A4C):   68.0 ml 36.66 ml/m LA  Biplane Vol: 67.2 ml 36.23 ml/m  AORTIC VALVE AV Area (Vmax):    2.15 cm AV Area (Vmean):   2.10 cm AV Area (VTI):     2.36 cm AV Vmax:           107.15 cm/s AV Vmean:  74.450 cm/s AV VTI:            0.238 m AV Peak Grad:      4.6 mmHg AV Mean Grad:      2.5 mmHg LVOT Vmax:         66.60 cm/s LVOT Vmean:        45.100 cm/s LVOT VTI:          0.162 m LVOT/AV VTI ratio: 0.68  AORTA Ao Root diam: 3.83 cm MITRAL VALVE               TRICUSPID VALVE MV Area (PHT): 2.45 cm    TR Peak grad:   9.1 mmHg MV Decel Time: 310 msec    TR Vmax:        151.00 cm/s MV E velocity: 98.10 cm/s MV A velocity: 78.80 cm/s  SHUNTS MV E/A ratio:  1.24        Systemic VTI:  0.16 m                            Systemic Diam: 2.10 cm Debbe Odea MD Electronically signed by Debbe Odea MD Signature Date/Time: 04/25/2022/2:41:32 PM    Final    CT ANGIO HEAD NECK W WO CM W PERF (CODE STROKE)  Result Date: 04/25/2022 CLINICAL DATA:  Neuro deficit, acute, stroke suspected left sided weakness EXAM: CT ANGIOGRAPHY HEAD AND NECK TECHNIQUE: Multidetector CT imaging of the head and neck was performed using the standard protocol during bolus administration of intravenous contrast. Multiplanar CT image reconstructions and MIPs were obtained to evaluate the vascular anatomy. Carotid stenosis measurements (when applicable) are obtained utilizing NASCET criteria, using the distal internal carotid diameter as the denominator. RADIATION DOSE REDUCTION: This exam was performed according to the departmental dose-optimization program which includes automated exposure control, adjustment of the mA and/or kV according to patient size and/or use of iterative reconstruction technique. CONTRAST:  OMNIPAQUE IOHEXOL 350 MG/ML SOLN COMPARISON:  Same day head CT. FINDINGS: CTA NECK FINDINGS Aortic arch: Ulcerated atherosclerosis of the aorta with approximately 3 x 8 mm penetrating ulcer along the aortic arch. Great vessel origins are patent  without significant stenosis. Right carotid system: Atherosclerosis at the carotid bifurcation without greater than 50% stenosis. Left carotid system: Atherosclerosis at the carotid bifurcation without greater than 50% stenosis. Vertebral arteries: Left dominant. Both vertebral arteries are patent without significant (greater than 50%) stenosis. Skeleton: Severe multilevel degenerative change in the visualized spine. Other neck: No acute abnormality. Upper chest: Partially imaged ground-glass opacities in the visualized lung apices. Review of the MIP images confirms the above findings CTA HEAD FINDINGS Anterior circulation: Bilateral intracranial ICAs, MCAs, and ACAs are patent without proximal hemodynamically significant stenosis. Diminutive right A1 ACA and right carotid terminus, probably congenital/anatomic variant given prominent left A1 ACA and carotid terminus. Mild ICA atherosclerosis. Posterior circulation: Non dominant/small right vertebral artery appears to largely terminate as PICA, anatomic variant. Left intradural vertebral artery is prominent and patent. Basilar artery and bilateral posterior cerebral arteries are patent. Moderate left P2 PCA stenosis. Venous sinuses: As permitted by contrast timing, patent. Anatomic variants: Detailed above. Review of the MIP images confirms the above findings IMPRESSION: 1. No emergent large vessel occlusion. 2. Moderate left P2 PCA stenosis. 3. 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 4. Partially imaged ground-glass opacities in the visualized lung apices, which could represent expiratory changes/atelectasis and/or pneumonia. CT of the  chest could further evaluate if clinically warranted. Electronically Signed   By: Feliberto HartsFrederick S Jones M.D.   On: 04/25/2022 08:51   CT HEAD CODE STROKE WO CONTRAST  Addendum Date: 04/25/2022   ADDENDUM REPORT: 04/25/2022 07:54 ADDENDUM: Study discussed by telephone with Dr. Aneta MinsPHILLIP STAFFORD  on 04/25/2022 at 0744 hours. Electronically Signed   By: Odessa FlemingH  Hall M.D.   On: 04/25/2022 07:54   Result Date: 04/25/2022 CLINICAL DATA:  Code stroke.  81 year old male EXAM: CT HEAD WITHOUT CONTRAST TECHNIQUE: Contiguous axial images were obtained from the base of the skull through the vertex without intravenous contrast. RADIATION DOSE REDUCTION: This exam was performed according to the departmental dose-optimization program which includes automated exposure control, adjustment of the mA and/or kV according to patient size and/or use of iterative reconstruction technique. COMPARISON:  Head CT 01/12/2020 FINDINGS: Brain: Mild motion artifact. No midline shift, mass effect, or evidence of intracranial mass lesion. No ventriculomegaly. No acute intracranial hemorrhage identified. Gray-white matter differentiation appears largely stable since 2021 and is largely normal for age. However, there are small chronic appearing lacunar infarcts of the left caudate (series 3, image 16) and left corona radiata (new, image 18). No cortically based acute infarct identified. Vascular: Calcified atherosclerosis at the skull base. No suspicious intracranial vascular hyperdensity. Dominant left vertebral artery. Skull: No acute osseous abnormality identified. Sinuses/Orbits: Bubbly opacity now along with mucosal thickening in the visible right maxillary sinus. Similar new bubbly opacity bilateral sphenoid sinuses. Other Visualized paranasal sinuses and mastoids are clear. Other: Benign left posterior convexity scalp lipoma series 4, image 40. No acute orbit or scalp soft tissue finding identified. ASPECTS [facility]([place] Stroke Program Early CT Score) Total score (0-10 with 10 being normal): 10 IMPRESSION: 1. No acute cortically based infarct or acute intracranial hemorrhage identified. ASPECTS 10. 2. Mild small vessel disease for age, although appears progressed since 2021. Electronically Signed: By: Odessa FlemingH  Hall M.D. On: 04/25/2022 07:42       Assessment/Plan 1.  Penetrating atherosclerotic ulcer of aortic arch  I discussed with patient and family that the atherosclerotic ulcer is relatively small and typically repair would mean an aortic root replacement.  Given the patient's advanced age as well as other comorbidities this would likely be a high mortality risk surgery.  Given the small ulcerative area, in this instance conservative medical management would be in his best interest including his Plavix and aspirin.  Is also recommended that the patient remain on statin therapy.  Discussed with patient and family and they are in agreement.  We will follow-up with the patient on an outpatient basis.  2. HIV Home meds  Family Communication: Multiple family members at bedside including children  Thank you for allowing us to participate in the care of this patient.   Georgiana SpinnerFallon E Basel Defalco, NP [facility] [phone] (Office Phone) [phone] (Office Fax)  04/26/2022 12:46 AM  Staff may message me via secure chat in Epic  but this may not receive immediate response,  please page for urgent matters!  Dictation software was used to generate the above note. Typos may occur and escape review, as with typed/written notes. Any error is purely unintentional.  Please contact me directly for clarity if needed.

## 2022-04-26 NOTE — Care Management (Signed)
Therapy recommending CIR at this time.  [facility] workup in progress

## 2022-04-26 NOTE — Progress Notes (Addendum)
Physical Therapy Treatment Patient Details Name: Brian [name] MRN: [id number] DOB: 09-12-1941 Today's Date: 04/26/2022   History of Present Illness Pt is an 80 yo male that presented to ED for L sided weakness and slurred speech. 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 PMH of COPD, HTN, HIV.    PT Comments    Patient alert, oriented to self, place, month/year, denied pain. Family interpreting throughout session. Bed mobility modA today due to increased grogginess, and pt did progress to fair sitting balance with BUE support. Several seated exercises performed with multimodal constant cues, and well as standing weight shifts with PT facilitation of L TKE and stability. He ambulated ~80ft total, min-modA with RW. Several instances of instability,  minA for lateral lean, cued for RW throughout, very slow. Pt with difficulty coordinating L step length/height. Returned to bed with all needs in reach. Pt and family remain motivated to return to PLOF, recommendation updated at this time to inpatient rehab to maximize pt outcomes, function, and independence, pt previously modI prior to admission.    Recommendations for follow up therapy are one component of a multi-disciplinary discharge planning process, led by the attending physician.  Recommendations may be updated based on patient status, additional functional criteria and insurance authorization.  Follow Up Recommendations   Acute inpatient rehab (3hours/day) Can patient physically be transported by private vehicle: No   Assistance Recommended at Discharge Frequent or constant Supervision/Assistance  Patient can return home with the following A lot of help with walking and/or transfers;A lot of help with bathing/dressing/bathroom;Assistance with cooking/housework;Direct supervision/assist for financial management;Assist for transportation;Help with stairs or  ramp for entrance;Direct supervision/assist for medications management   Equipment Recommendations  Other (comment) (TBD)    Recommendations for Other Services       Precautions / Restrictions Precautions Precautions: Fall Restrictions Weight Bearing Restrictions: No     Mobility  Bed Mobility Overal bed mobility: Needs Assistance Bed Mobility: Supine to Sit, Sit to Supine     Supine to sit: Mod assist Sit to supine: Mod assist   General bed mobility comments: pt groggy today    Transfers Overall transfer level: Needs assistance Equipment used: Rolling walker (2 wheels) Transfers: Sit to/from Stand Sit to Stand: Min assist, Min guard           General transfer comment: cued for hand placement each time, minA for steadying and for safe return to sitting, pt able to initiate standing much better today    Ambulation/Gait Ambulation/Gait assistance: Min assist, Mod assist Gait Distance (Feet): 12 Feet Assistive device: Rolling walker (2 wheels)   Gait velocity: very decreased     General Gait Details: 47ft, then seated rest break, then 80ft. 1-2 instances of modA for instability, minA for lateral lean, cued for RW throughout, very slow. Pt with difficulty coordinating L step length/height.   Stairs             Wheelchair Mobility    Modified Rankin (Stroke Patients Only)       Balance Overall balance assessment: Needs assistance Sitting-balance support: Feet supported Sitting balance-Leahy Scale: Poor Sitting balance - Comments: did progress to fair when pt more awake, CGA-supervision   Standing balance support: Bilateral upper extremity supported Standing balance-Leahy Scale: Poor Standing balance comment: minA for majority of standing due to posterior lean, lack of L knee TKE  Cognition Arousal/Alertness: Awake/alert Behavior During Therapy: WFL for tasks assessed/performed, Anxious Overall Cognitive  Status: Difficult to assess                                 General Comments: Pt oriented to self, location and month/year today. family interpreting throughout        Exercises Other Exercises Other Exercises: standing weight shifts with faciliation of L knee quad/knee stabilization. seated marching, LAQ and toe taps,10-15 reps with cues throughout especially for pacing    General Comments        Pertinent Vitals/Pain Pain Assessment Pain Assessment: No/denies pain    Home Living                          Prior Function            PT Goals (current goals can now be found in the care plan section) Progress towards PT goals: Progressing toward goals    Frequency    7X/week      PT Plan Current plan remains appropriate    Co-evaluation              AM-PAC PT "6 Clicks" Mobility   Outcome Measure  Help needed turning from your back to your side while in a flat bed without using bedrails?: A Little Help needed moving from lying on your back to sitting on the side of a flat bed without using bedrails?: A Little Help needed moving to and from a bed to a chair (including a wheelchair)?: A Little Help needed standing up from a chair using your arms (e.g., wheelchair or bedside chair)?: A Little Help needed to walk in hospital room?: A Lot Help needed climbing 3-5 steps with a railing? : Total 6 Click Score: 15    End of Session Equipment Utilized During Treatment: Gait belt Activity Tolerance: Patient tolerated treatment well Patient left: in bed;with call bell/phone within reach;with family/visitor present Nurse Communication: Mobility status PT Visit Diagnosis: Other abnormalities of gait and mobility (R26.89);Difficulty in walking, not elsewhere classified (R26.2);Muscle weakness (generalized) (M62.81)     Time: 7829-5621 PT Time Calculation (min) (ACUTE ONLY): 23 min  Charges:  $Therapeutic Activity: 8-22 mins $Neuromuscular  Re-education: 8-22 mins                    Olga Coaster PT, DPT 10:22 AM,04/26/22

## 2022-04-27 DIAGNOSIS — H1031 Unspecified acute conjunctivitis, right eye: Secondary | ICD-10-CM | POA: Diagnosis not present

## 2022-04-27 DIAGNOSIS — I6312 Cerebral infarction due to embolism of basilar artery: Secondary | ICD-10-CM | POA: Diagnosis not present

## 2022-04-27 DIAGNOSIS — H109 Unspecified conjunctivitis: Secondary | ICD-10-CM

## 2022-04-27 MED ORDER — GATIFLOXACIN 0.5 % OP SOLN
1.0000 [drp] | Freq: Four times a day (QID) | OPHTHALMIC | Status: DC
  Administered 2022-04-27 – 2022-05-04 (×29): 1 [drp] via OPHTHALMIC
  Filled 2022-04-27: qty 2.5

## 2022-04-27 MED ORDER — POLYVINYL ALCOHOL 1.4 % OP SOLN
1.0000 [drp] | OPHTHALMIC | Status: DC | PRN
  Administered 2022-04-27 – 2022-04-29 (×2): 1 [drp] via OPHTHALMIC
  Filled 2022-04-27: qty 15

## 2022-04-27 MED ORDER — ALBUTEROL SULFATE (2.5 MG/3ML) 0.083% IN NEBU: 2.5000 mg | INHALATION_SOLUTION | Freq: Four times a day (QID) | RESPIRATORY_TRACT | Status: DC | PRN

## 2022-04-27 MED ORDER — ALBUTEROL SULFATE (2.5 MG/3ML) 0.083% IN NEBU
2.5000 mg | INHALATION_SOLUTION | Freq: Four times a day (QID) | RESPIRATORY_TRACT | Status: DC
  Administered 2022-04-27: 2.5 mg via RESPIRATORY_TRACT
  Filled 2022-04-27: qty 3

## 2022-04-27 NOTE — Progress Notes (Signed)
  Progress Note   Patient: Brian [name] QQP:[id number] DOB: October 07, 1941 DOA: 04/25/2022     2 DOS: the patient was seen and examined on 04/27/2022   Brief hospital course: [name] is a 81 y.o. male with medical history significant of s/p for pacemaker placement, hypertension, hyperlipidemia, COPD using as needed oxygen at night, depression with anxiety, HIV, bradycardia, BPH, OSA not using CPAP, who presents with left-sided weakness and difficulty speaking. Patient is a seen by neurology, symptoms consistent with stroke.  CT angiogram of the neck and head is negative for major occlusion. Could not do MRI due to pacemaker.  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  Patient does not need surgery per vascular surgery.  Also cleared to give Plavix 300 mg loading dose followed by 75 mg daily. Echocardiogram showed ejection fraction 55 to 60%, with grade 2 diastolic dysfunction. Patient is also evaluated by PT/OT, recommended nursing home placement.  Assessment and Plan: Acute stroke. Essential hypertension. 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 Patient is evaluated by neurology, acute stroke appears to be caused by embolism from aortic arch. Continue aspirin and Plavix after loading dose of Plavix.  Continue statin, added Zetia.  Right eye conjunctivitis. Patient has reddened right eye.  Due to HIV, I will add antibiotics eyedrops.  Also consult ophthalmology, I have left a message to Dr. Elmer Bales cell phone number  HIV. Continue home medicines, follow-up with PCP.  COPD. Condition stable.        Subjective:  Patient is complaining of right eye pain, redness with some white-colored discharge.  No pain with eye movement. No fever or chills.  Physical Exam: Vitals:   04/27/22 0109 04/27/22 0534 04/27/22 0902 04/27/22 1203  BP: (!) 164/86 (!) 160/85 (!) 190/94 (!) 185/86  Pulse: (!) 55 (!) 58 (!) 52 (!) 53  Resp: 16 18 18 17    Temp: 97.6 F (36.4 C) 97.7 F (36.5 C) 98.4 F (36.9 C) 98.2 F (36.8 C)  TempSrc:    Oral  SpO2: 100% 100% 94% 100%  Weight:      Height:       General exam: Appears calm and comfortable  Respiratory system: Clear to auscultation. Respiratory effort normal. Cardiovascular system: S1 & S2 heard, RRR. No JVD, murmurs, rubs, gallops or clicks. No pedal edema. Gastrointestinal system: Abdomen is nondistended, soft and nontender. No organomegaly or masses felt. Normal bowel sounds heard. Central nervous system: Alert and oriented. No focal neurological deficits. Extremities: Symmetric 5 x 5 power. Skin: No rashes, lesions or ulcers Psychiatry: Judgement and insight appear normal. Mood & affect appropriate.  Right eyes appear red.  Data Reviewed:  Reviewed lab results.  Family Communication: Daughter-in-law updated at bedside  Disposition: Status is: Inpatient Remains inpatient appropriate because: Unsafe discharge  Planned Discharge Destination: Skilled nursing facility    Time spent: 35 minutes  Author: , MD 04/27/2022 12:08 PM  For on call review [email or web address].

## 2022-04-27 NOTE — Progress Notes (Signed)
Physical Therapy Treatment Patient Details Name: Brian [name] MRN: [id number] DOB: 14-May-1941 Today's Date: 04/27/2022   History of Present Illness Pt is an 81 yo male that presented to ED for L sided weakness and slurred speech. 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 PMH of COPD, HTN, HIV.    PT Comments    Pt received up in chair. Per nursing documentation, BP high earlier today. Reassessed upon arrival and found to be 185/86, HR 50bpm, O2 sats 100% on RA. Pt fatigued, requesting to return to bed. Session modified due to BP concerns, nursing aware. Will continue to progress as tolerated. Daughter stated she would prefer pt to d/c to SNF once medically stable since she cannot currently care for him at her home.    Recommendations for follow up therapy are one component of a multi-disciplinary discharge planning process, led by the attending physician.  Recommendations may be updated based on patient status, additional functional criteria and insurance authorization.  Follow Up Recommendations  Acute inpatient rehab (3hours/day) Can patient physically be transported by private vehicle: No   Assistance Recommended at Discharge Frequent or constant Supervision/Assistance  Patient can return home with the following A lot of help with walking and/or transfers;A lot of help with bathing/dressing/bathroom;Assistance with cooking/housework;Direct supervision/assist for financial management;Assist for transportation;Help with stairs or ramp for entrance;Direct supervision/assist for medications management   Equipment Recommendations       Recommendations for Other Services       Precautions / Restrictions Precautions Precautions: Fall Restrictions Weight Bearing Restrictions: No     Mobility  Bed Mobility Overal bed mobility: Needs Assistance Bed Mobility: Supine to Sit, Sit to Supine     Supine  to sit: Min assist, HOB elevated Sit to supine: Min assist, HOB elevated        Transfers Overall transfer level: Needs assistance Equipment used: 1 person hand held assist Transfers: Sit to/from Stand Sit to Stand: Min assist, Mod assist Stand pivot transfers: Min assist, Mod assist Step pivot transfers: Min assist, Mod assist       General transfer comment: vc's for safety and fall prevention    Ambulation/Gait                   Stairs             Wheelchair Mobility    Modified Rankin (Stroke Patients Only)       Balance Overall balance assessment: Needs assistance Sitting-balance support: Feet supported Sitting balance-Leahy Scale: Fair Sitting balance - Comments: Able to maintain sitting EOB x 5 minutes while completing UE dynamic tasks with Supervision.                                    Cognition Arousal/Alertness: Awake/alert, Lethargic Behavior During Therapy: WFL for tasks assessed/performed, Anxious Overall Cognitive Status: Impaired/Different from baseline Area of Impairment: Safety/judgement, Awareness, Problem solving                         Safety/Judgement: Decreased awareness of safety, Decreased awareness of deficits Awareness: Emergent Problem Solving: Decreased initiation, Difficulty sequencing, Requires verbal cues, Requires tactile cues General Comments: Pt oriented to self, location and month/year today. family interpreting throughout        Exercises Other Exercises Other Exercises: Discussed dispo with pt's daughter who feels  she can not care for her dad at her home at this time and feels he will benefit from short term SNF placement once medically stable    General Comments General comments (skin integrity, edema, etc.): Session modified due to concerns for high BP, nursing notified      Pertinent Vitals/Pain Pain Assessment Pain Assessment: No/denies pain    Home Living                           Prior Function            PT Goals (current goals can now be found in the care plan section) Acute Rehab PT Goals Patient Stated Goal: to go home    Frequency    7X/week      PT Plan Discharge plan needs to be updated    Co-evaluation              AM-PAC PT "6 Clicks" Mobility   Outcome Measure  Help needed turning from your back to your side while in a flat bed without using bedrails?: A Little Help needed moving from lying on your back to sitting on the side of a flat bed without using bedrails?: A Little Help needed moving to and from a bed to a chair (including a wheelchair)?: A Little Help needed standing up from a chair using your arms (e.g., wheelchair or bedside chair)?: A Little Help needed to walk in hospital room?: A Lot Help needed climbing 3-5 steps with a railing? : Total 6 Click Score: 15    End of Session Equipment Utilized During Treatment: Gait belt Activity Tolerance: Patient tolerated treatment well Patient left: in bed;with call bell/phone within reach;with bed alarm set;with family/visitor present Nurse Communication: Mobility status (High BP) PT Visit Diagnosis: Other abnormalities of gait and mobility (R26.89);Difficulty in walking, not elsewhere classified (R26.2);Muscle weakness (generalized) (M62.81)     Time: 1400-1420 PT Time Calculation (min) (ACUTE ONLY): 20 min  Charges:  $Therapeutic Activity: 8-22 mins                    Zadie Cleverly, PTA    Jannet Askew 04/27/2022, 2:37 PM

## 2022-04-27 NOTE — Plan of Care (Signed)
  Problem: Education: Goal: Knowledge of General Education information will improve Description: Including pain rating scale, medication(s)/side effects and non-pharmacologic comfort measures Outcome: Progressing   Problem: Health Behavior/Discharge Planning: Goal: Ability to manage health-related needs will improve Outcome: Progressing   Problem: Clinical Measurements: Goal: Ability to maintain clinical measurements within normal limits will improve Outcome: Progressing Goal: Will remain free from infection Outcome: Progressing Goal: Diagnostic test results will improve Outcome: Progressing Goal: Respiratory complications will improve Outcome: Progressing Goal: Cardiovascular complication will be avoided Outcome: Progressing   Problem: Activity: Goal: Risk for activity intolerance will decrease Outcome: Progressing   Problem: Nutrition: Goal: Adequate nutrition will be maintained Outcome: Progressing   Problem: Coping: Goal: Level of anxiety will decrease Outcome: Progressing   Problem: Elimination: Goal: Will not experience complications related to bowel motility Outcome: Progressing Goal: Will not experience complications related to urinary retention Outcome: Progressing   Problem: Pain Managment: Goal: General experience of comfort will improve Outcome: Progressing   Problem: Safety: Goal: Ability to remain free from injury will improve Outcome: Progressing   Problem: Skin Integrity: Goal: Risk for impaired skin integrity will decrease Outcome: Progressing   Problem: Education: Goal: Knowledge of disease or condition will improve Outcome: Progressing Goal: Knowledge of secondary prevention will improve (SELECT ALL) Outcome: Progressing Goal: Knowledge of patient specific risk factors will improve (INDIVIDUALIZE FOR PATIENT) Outcome: Progressing   Problem: Coping: Goal: Will verbalize positive feelings about self Outcome: Progressing Goal: Will  identify appropriate support needs Outcome: Progressing   Problem: Health Behavior/Discharge Planning: Goal: Ability to manage health-related needs will improve Outcome: Progressing   Problem: Self-Care: Goal: Ability to participate in self-care as condition permits will improve Outcome: Progressing Goal: Verbalization of feelings and concerns over difficulty with self-care will improve Outcome: Progressing Goal: Ability to communicate needs accurately will improve Outcome: Progressing   

## 2022-04-27 NOTE — Consult Note (Signed)
Reason for Consult:redness, right eye. Referring Physician: Dr. Chipper Herb, [facility] hospitalist. Chief complaint: eye redness and burning  HPI: Brian [name] is an 81 y.o. male admitted for recent CVA, awaiting discharge/placement who developed redness and burning in the right eye today.  He reports that he was in his usual state of health and felt burning early this morning.  Associated symptoms include redness and discharge in the right eye.  He was started on gatifloxacin eye drops and he reports that symptoms have improved.  He reports that his vision is at baseline. He denies flashes, floaters, double vision, decreased vision.  History is obtained with assistance from family at bedside. Past ocular history is significant only for history of cataract surgery.  His last eye exam was about 3 years ago, prior to the pandemic.   Past Medical History:  Diagnosis Date   Arthritis    Asthma    Bradycardia    Depression    HIV (human immunodeficiency virus infection) (HCC)    Hyperlipidemia     ROS  Past Surgical History:  Procedure Laterality Date   cataracts     EYE SURGERY     PACEMAKER INSERTION N/A 08/02/2015   Procedure: INSERTION PACEMAKER;  Surgeon: Corky Downs, MD;  Location: [name];  Service: Cardiovascular;  Laterality: N/A;    Family History  Problem Relation Age of Onset   Heart disease Brother    Hypertension Other     Social History:  reports that he has never smoked. He has never used smokeless tobacco. He reports current alcohol use of about 2.0 standard drinks of alcohol per week. He reports that he does not use drugs.  Allergies: No Known Allergies  Prior to Admission medications   Medication Sig Start Date End Date Taking? Authorizing Provider  abacavir-dolutegravir-lamiVUDine (TRIUMEQ) 600-50-300 MG tablet Take 1 tablet by mouth daily. 02/19/22  Yes Lynn Ito, MD  alendronate (FOSAMAX) 70 MG tablet Take 70 mg by mouth once a week. Take on Saturday.  07/21/15  Yes [provider]  atorvastatin (LIPITOR) 40 MG tablet Take 40 mg by mouth daily.   Yes [provider]  azelastine (OPTIVAR) 0.05 % ophthalmic solution Apply 1 drop to eye 2 (two) times daily. 03/28/21  Yes [provider]  clonazePAM (KLONOPIN) 0.5 MG tablet Take 0.5 mg by mouth 2 (two) times daily as needed for anxiety.   Yes [provider]  DULoxetine (CYMBALTA) 30 MG capsule Take 30 mg by mouth daily. 08/14/20  Yes [provider]  dutasteride (AVODART) 0.5 MG capsule Take 1 capsule (0.5 mg total) by mouth daily. 10/25/20  Yes Sondra Come, MD  loratadine (CLARITIN) 10 MG tablet Take 10 mg by mouth daily.   Yes [provider]  metoprolol succinate (TOPROL-XL) 25 MG 24 hr tablet TAKE 1 TABLET (25 MG TOTAL) BY MOUTH IN THE MORNING AND AT BEDTIME. 03/11/22  Yes Masoud, Renda Rolls, MD  omeprazole (PRILOSEC) 20 MG capsule Take 20 mg by mouth daily. 07/21/15  Yes [provider]  pregabalin (LYRICA) 50 MG capsule Take 50 mg by mouth in the morning and at bedtime.   Yes [provider]  tamsulosin (FLOMAX) 0.4 MG CAPS capsule Take 1 capsule (0.4 mg total) by mouth daily. 10/25/20  Yes Sondra Come, MD  traMADol-acetaminophen (ULTRACET) 37.5-325 MG tablet Take 1 tablet by mouth daily as needed. 03/29/21  Yes [provider]  triamcinolone cream (KENALOG) 0.1 % Apply 1 application topically 2 (two) times daily. 06/25/21  Yes [provider]  nystatin-triamcinolone ointment (MYCOLOG) Apply topically 2 (two) times daily. Patient not taking: Reported on 04/25/2022 07/05/21   Sondra Come, MD  VENTOLIN HFA 108 (90 BASE) MCG/ACT inhaler Inhale 2 puffs into the lungs 4 (four) times daily as needed. For shortness of breath and/or wheezing. 07/21/15   [provider]    Results for orders placed or performed during the hospital encounter of 04/25/22 (from the past 48 hour(s))  Lipid panel     Status:  Abnormal   Collection Time: 04/26/22  5:13 AM  Result Value Ref Range   Cholesterol 149 0 - 200 mg/dL   Triglycerides 474 <259 mg/dL   HDL 37 (L) >56 mg/dL   Total CHOL/HDL Ratio 4.0 RATIO   VLDL 26 0 - 40 mg/dL   LDL Cholesterol 86 0 - 99 mg/dL    Comment:        Total Cholesterol/HDL:CHD Risk Coronary Heart Disease Risk Table                     Men   Women  1/2 Average Risk   3.4   3.3  Average Risk       5.0   4.4  2 X Average Risk   9.6   7.1  3 X Average Risk  23.4   11.0        Use the calculated Patient Ratio above and the CHD Risk Table to determine the patient's CHD Risk.        ATP III CLASSIFICATION (LDL):  <100     mg/dL   Optimal  387-564  mg/dL   Near or Above                    Optimal  130-159  mg/dL   Borderline  332-951  mg/dL   High  >884     mg/dL   Very High Performed at [facility], [address] [place], [place] [address]     ECHOCARDIOGRAM COMPLETE  Result Date: 04/25/2022    ECHOCARDIOGRAM REPORT   Patient Name:   Brian [name] Date of Exam: 04/25/2022 Medical Rec #:  [id number]    Height:       66.0 in Accession #:    [id number]   Weight:       167.5 lb Date of Birth:  Apr 22, 1941     BSA:          1.855 m Patient Age:    81 years     BP:           151/91 mmHg Patient Gender: M            HR:           50 bpm. Exam Location:  [facility] Procedure: 2D Echo, Cardiac Doppler and Color Doppler Indications:     Stroke I63.9  History:         Patient has prior history of Echocardiogram examinations, most                  recent 07/14/2021. Risk Factors:Dyslipidemia. Bradycardia.  Sonographer:     Cristela Blue Referring Phys:  [id number] Brien Few NIU Diagnosing Phys: Debbe Odea MD  Sonographer Comments: Suboptimal apical window. IMPRESSIONS  1. Left ventricular ejection fraction, by estimation, is 55 to 60%. The left ventricle has normal function. The left ventricle has no regional wall motion abnormalities. There is mild left ventricular hypertrophy. Left  ventricular diastolic  parameters are consistent with Grade II diastolic dysfunction (pseudonormalization).  2. Right ventricular systolic function is normal. The right ventricular size is normal.  3. Left atrial size was mildly dilated.  4. The mitral valve is normal in structure. No evidence of mitral valve regurgitation.  5. The aortic valve is grossly normal. Aortic valve regurgitation is not visualized.  6. Aortic dilatation noted. There is mild dilatation of the aortic root, measuring 39 mm.  7. The inferior vena cava is normal in size with <50% respiratory variability, suggesting right atrial pressure of 8 mmHg. FINDINGS  Left Ventricle: Left ventricular ejection fraction, by estimation, is 55 to 60%. The left ventricle has normal function. The left ventricle has no regional wall motion abnormalities. The left ventricular internal cavity size was normal in size. There is  mild left ventricular hypertrophy. Left ventricular diastolic parameters are consistent with Grade II diastolic dysfunction (pseudonormalization). Right Ventricle: The right ventricular size is normal. No increase in right ventricular wall thickness. Right ventricular systolic function is normal. Left Atrium: Left atrial size was mildly dilated. Right Atrium: Right atrial size was normal in size. Pericardium: There is no evidence of pericardial effusion. Mitral Valve: The mitral valve is normal in structure. No evidence of mitral valve regurgitation. Tricuspid Valve: The tricuspid valve is normal in structure. Tricuspid valve regurgitation is not demonstrated. Aortic Valve: The aortic valve is grossly normal. Aortic valve regurgitation is not visualized. Aortic valve mean gradient measures 2.5 mmHg. Aortic valve peak gradient measures 4.6 mmHg. Aortic valve area, by VTI measures 2.36 cm. Pulmonic Valve: The pulmonic valve was normal in structure. Pulmonic valve regurgitation is not visualized. Aorta: Aortic dilatation noted. There is mild  dilatation of the aortic root, measuring 39 mm. Venous: The inferior vena cava is normal in size with less than 50% respiratory variability, suggesting right atrial pressure of 8 mmHg. IAS/Shunts: No atrial level shunt detected by color flow Doppler.  LEFT VENTRICLE PLAX 2D LVIDd:         4.40 cm   Diastology LVIDs:         3.00 cm   LV e' medial:    4.35 cm/s LV PW:         1.40 cm   LV E/e' medial:  22.6 LV IVS:        1.20 cm   LV e' lateral:   4.35 cm/s LVOT diam:     2.10 cm   LV E/e' lateral: 22.6 LV SV:         56 LV SV Index:   30 LVOT Area:     3.46 cm  RIGHT VENTRICLE RV Basal diam:  3.30 cm RV S prime:     11.90 cm/s TAPSE (M-mode): 2.3 cm LEFT ATRIUM             Index        RIGHT ATRIUM           Index LA diam:        3.60 cm 1.94 cm/m   RA Area:     16.00 cm LA Vol (A2C):   60.9 ml 32.83 ml/m  RA Volume:   37.90 ml  20.43 ml/m LA Vol (A4C):   68.0 ml 36.66 ml/m LA Biplane Vol: 67.2 ml 36.23 ml/m  AORTIC VALVE AV Area (Vmax):    2.15 cm AV Area (Vmean):   2.10 cm AV Area (VTI):     2.36 cm AV Vmax:  107.15 cm/s AV Vmean:          74.450 cm/s AV VTI:            0.238 m AV Peak Grad:      4.6 mmHg AV Mean Grad:      2.5 mmHg LVOT Vmax:         66.60 cm/s LVOT Vmean:        45.100 cm/s LVOT VTI:          0.162 m LVOT/AV VTI ratio: 0.68  AORTA Ao Root diam: 3.83 cm MITRAL VALVE               TRICUSPID VALVE MV Area (PHT): 2.45 cm    TR Peak grad:   9.1 mmHg MV Decel Time: 310 msec    TR Vmax:        151.00 cm/s MV E velocity: 98.10 cm/s MV A velocity: 78.80 cm/s  SHUNTS MV E/A ratio:  1.24        Systemic VTI:  0.16 m                            Systemic Diam: 2.10 cm Debbe Odea MD Electronically signed by Debbe Odea MD Signature Date/Time: 04/25/2022/2:41:32 PM    Final     Blood pressure (!) 185/86, pulse (!) 53, temperature 98.2 F (36.8 C), temperature source Oral, resp. rate 17, height 5\' 6"  (1.676 m), weight 76 kg, SpO2 97 %.  Mental status: Alert and Oriented x 4.   Gatifloxacin eye drops at bedside.  Visual Acuity:  20/50 OD  20/40 near [place]  Pupils:  Equally round/ reactive to light.  No Afferent defect.  Motility:  Full/ orthophoric  Visual Fields:  Full to confrontation  IOP:  soft OU.  External/ Lids/ Lashes:  Normal  Anterior Segment:  Conjunctiva:  1+ hyperemia temporally in the right eye.  Normal  in the left eye.  No discharge at this time.  Cornea:  Normal  OU  Anterior Chamber: Normal  OU  Lens:   IOLs, clear OU  Posterior Segment: not dilated.    Assessment/Plan: Conjunctivitis, right eye, possibly bacterial.  No intraocular infection or inflammation detected on exam today.  Improving already with gatifloxacin eye drops.  Recommend continue for one week.  Ok to use in the left eye if redness and discharge develops in the left eye.  Given burning and discomfort and regular use of artificial tears at home, recommend use of artifical tears qid and prn for discomfort.  Please reconsult if develops decreased vision or worsening redness and eye pain.  Otherwise ok to return to outpatient primary eye care provider or [facility] 1 week following discharge.   [facility] 04/27/2022, 2:14 PM

## 2022-04-27 NOTE — Progress Notes (Signed)
Inpatient Rehab Admissions:  Inpatient Rehab Consult received.  I spoke with patient's daughter Johney Maine on the telephone for rehabilitation assessment and to discuss goals and expectations of an inpatient rehab admission.  She acknowledged understanding of CIR. She is concerned about pt's current living situation and pt not having 24/7 support after discharge. She is interested in pt pursuing SNF. TOC made aware.  Signed: Wolfgang Phoenix, MS, CCC-SLP Admissions Coordinator [phone]

## 2022-04-28 DIAGNOSIS — I10 Essential (primary) hypertension: Secondary | ICD-10-CM | POA: Diagnosis not present

## 2022-04-28 DIAGNOSIS — H1031 Unspecified acute conjunctivitis, right eye: Secondary | ICD-10-CM | POA: Diagnosis not present

## 2022-04-28 DIAGNOSIS — I6312 Cerebral infarction due to embolism of basilar artery: Secondary | ICD-10-CM | POA: Diagnosis not present

## 2022-04-28 MED ORDER — ORAL CARE MOUTH RINSE: 15.0000 mL | OROMUCOSAL | Status: DC | PRN

## 2022-04-28 MED ORDER — AMLODIPINE BESYLATE 5 MG PO TABS
5.0000 mg | ORAL_TABLET | Freq: Every day | ORAL | Status: DC
  Administered 2022-04-28: 5 mg via ORAL
  Filled 2022-04-28: qty 1

## 2022-04-28 NOTE — Plan of Care (Signed)
  Problem: Education: Goal: Knowledge of General Education information will improve Description: Including pain rating scale, medication(s)/side effects and non-pharmacologic comfort measures Outcome: Progressing   Problem: Health Behavior/Discharge Planning: Goal: Ability to manage health-related needs will improve Outcome: Progressing   Problem: Clinical Measurements: Goal: Ability to maintain clinical measurements within normal limits will improve Outcome: Progressing Goal: Will remain free from infection Outcome: Progressing Goal: Diagnostic test results will improve Outcome: Progressing Goal: Respiratory complications will improve Outcome: Progressing Goal: Cardiovascular complication will be avoided Outcome: Progressing   Problem: Nutrition: Goal: Adequate nutrition will be maintained Outcome: Progressing   Problem: Coping: Goal: Level of anxiety will decrease Outcome: Progressing   Problem: Elimination: Goal: Will not experience complications related to bowel motility Outcome: Progressing Goal: Will not experience complications related to urinary retention Outcome: Progressing   Problem: Pain Managment: Goal: General experience of comfort will improve Outcome: Progressing   Problem: Safety: Goal: Ability to remain free from injury will improve Outcome: Progressing   Problem: Skin Integrity: Goal: Risk for impaired skin integrity will decrease Outcome: Progressing   Problem: Education: Goal: Knowledge of disease or condition will improve Outcome: Progressing Goal: Knowledge of secondary prevention will improve (SELECT ALL) Outcome: Progressing Goal: Knowledge of patient specific risk factors will improve (INDIVIDUALIZE FOR PATIENT) Outcome: Progressing   Problem: Coping: Goal: Will verbalize positive feelings about self Outcome: Progressing Goal: Will identify appropriate support needs Outcome: Progressing   Problem: Health Behavior/Discharge  Planning: Goal: Ability to manage health-related needs will improve Outcome: Progressing   Problem: Self-Care: Goal: Ability to participate in self-care as condition permits will improve Outcome: Progressing Goal: Verbalization of feelings and concerns over difficulty with self-care will improve Outcome: Progressing Goal: Ability to communicate needs accurately will improve Outcome: Progressing   Problem: Activity: Goal: Risk for activity intolerance will decrease Outcome: Not Progressing  PT unable to work with pt today; due to pt's BP

## 2022-04-28 NOTE — Progress Notes (Signed)
  Progress Note   Patient: Brian [name] ONG:[id number] DOB: 02-Jun-1941 DOA: 04/25/2022     3 DOS: the patient was seen and examined on 04/28/2022   Brief hospital course: Brian [name] is a 81 y.o. male with medical history significant of s/p for pacemaker placement, hypertension, hyperlipidemia, COPD using as needed oxygen at night, depression with anxiety, HIV, bradycardia, BPH, OSA not using CPAP, who presents with left-sided weakness and difficulty speaking. Patient is a seen by neurology, symptoms consistent with stroke.  CT angiogram of the neck and head is negative for major occlusion. Could not do MRI due to pacemaker.  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  Patient does not need surgery per vascular surgery.  Also cleared to give Plavix 300 mg loading dose followed by 75 mg daily. Echocardiogram showed ejection fraction 55 to 60%, with grade 2 diastolic dysfunction. Patient is also evaluated by PT/OT, recommended nursing home placement.  Assessment and Plan: Acute stroke. Essential hypertension. 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 Patient is evaluated by neurology, acute stroke appears to be caused by embolism from aortic arch. Continue aspirin and Plavix after loading dose of Plavix.  Continue statin,  Zetia.  Pending nursing home placement.   Right eye conjunctivitis. Appreciate ophthalmology consulted.  Condition is improving on antibiotic eyedrops.  Essential hypertension. Blood pressure still running high, patient is 3 days out from the initial stroke, will start amlodipine 5 mg daily.  HIV. Continue home medicines, follow-up with PCP.  COPD. Condition stable.      Subjective:  Right eye burning pain much improved.  Right leg spasm also improved.  Unsteady gait with walking.  Physical Exam: Vitals:   04/28/22 0623 04/28/22 0843 04/28/22 0905 04/28/22 1129  BP: (!) 182/98 (!) 176/96 (!) 170/100 (!)  160/102  Pulse: (!) 59 (!) 59 61 (!) 56  Resp: 19 18  17   Temp: 97.7 F (36.5 C) 98 F (36.7 C)  98.7 F (37.1 C)  TempSrc:      SpO2: 99% 97%  99%  Weight:      Height:       General exam: Appears calm and comfortable  Respiratory system: Clear to auscultation. Respiratory effort normal. Cardiovascular system: S1 & S2 heard, RRR. No JVD, murmurs, rubs, gallops or clicks. No pedal edema. Gastrointestinal system: Abdomen is nondistended, soft and nontender. No organomegaly or masses felt. Normal bowel sounds heard. Central nervous system: Alert and oriented. No focal neurological deficits. Extremities: Symmetric 5 x 5 power. Skin: No rashes, lesions or ulcers Psychiatry: Judgement and insight appear normal. Mood & affect appropriate.   Data Reviewed:  There are no new results to review at this time.  Family Communication: Daughter in law updated at the bedside.  Disposition: Status is: Inpatient Remains inpatient appropriate because: Unsafe discharge, pending nursing placement  Planned Discharge Destination: Skilled nursing facility    Time spent: 28 minutes  Author: , MD 04/28/2022 1:29 PM  For on call review [email or web address].

## 2022-04-28 NOTE — Progress Notes (Addendum)
PT Cancellation Note  Patient Details Name: Jonpaul Lumm MRN: [id number] DOB: Jan 21, 1941   Cancelled Treatment:    Reason Eval/Treat Not Completed: Medical issues which prohibited therapy  BP taken prior to session 170/100 P 61.  Reddened face.  Session held a this time due to elevated BP.  Discussed with RN.  Will return later in AM to see if BP is lower and he is appropriate for session.   Danielle Dess 04/28/2022, 10:01 AM

## 2022-04-28 NOTE — Progress Notes (Signed)
PT Cancellation Note  Patient Details Name: Brian [name] MRN: [id number] DOB: [birth year]   Cancelled Treatment:    Reason Eval/Treat Not Completed: Medical issues which prohibited therapy (Patient vitals outside therapuetic range. Nursing agrees on hold for today.)   Precious Bard, PT, DPT  04/28/2022, 2:46 PM

## 2022-04-28 NOTE — Progress Notes (Signed)
PT Cancellation Note  Patient Details Name: Brian [name] MRN: [id number] DOB: 1941-08-29   Cancelled Treatment:    Reason Eval/Treat Not Completed: Medical issues which prohibited therapy  BP remains elevated 160/102.  Tech in upon arrival to check.  Will return in PM to see if BP is within parameters.   Danielle Dess 04/28/2022, 11:34 AM

## 2022-04-29 DIAGNOSIS — I10 Essential (primary) hypertension: Secondary | ICD-10-CM | POA: Diagnosis not present

## 2022-04-29 DIAGNOSIS — I6312 Cerebral infarction due to embolism of basilar artery: Secondary | ICD-10-CM | POA: Diagnosis not present

## 2022-04-29 DIAGNOSIS — J449 Chronic obstructive pulmonary disease, unspecified: Secondary | ICD-10-CM | POA: Diagnosis not present

## 2022-04-29 MED ORDER — AMLODIPINE BESYLATE 5 MG PO TABS
5.0000 mg | ORAL_TABLET | Freq: Once | ORAL | Status: AC
Start: 2022-04-29 — End: 2022-04-29
  Administered 2022-04-29: 5 mg via ORAL
  Filled 2022-04-29: qty 1

## 2022-04-29 MED ORDER — AMLODIPINE BESYLATE 10 MG PO TABS
10.0000 mg | ORAL_TABLET | Freq: Every day | ORAL | Status: DC
  Administered 2022-04-30 – 2022-05-04 (×5): 10 mg via ORAL
  Filled 2022-04-29 (×5): qty 1

## 2022-04-29 MED ORDER — LACTULOSE 10 GM/15ML PO SOLN
20.0000 g | Freq: Once | ORAL | Status: AC
  Administered 2022-04-29: 20 g via ORAL
  Filled 2022-04-29: qty 30

## 2022-04-29 NOTE — NC FL2 (Signed)
[place] MEDICAID FL2 LEVEL OF CARE SCREENING TOOL     IDENTIFICATION  Patient Name: Brian [name] Birthdate: 1941-10-24 Sex: male Admission Date (Current Location): 04/25/2022  [facility] and [place] Number:  Chiropodist and Address:  [facility], [address] [place], [place] [address]      Provider Number: [id number]  Attending Physician Name and Address:  Marrion Coy, MD  Relative Name and Phone Number:       Current Level of Care: Hospital Recommended Level of Care: Skilled Nursing Facility Prior Approval Number:    Date Approved/Denied:   PASRR Number: [id number]  Discharge Plan: SNF    Current Diagnoses: Patient Active Problem List   Diagnosis Date Noted   Conjunctivitis 04/27/2022   Stroke (HCC) 04/25/2022   Hyperlipidemia    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    Depression with anxiety    Leg pain    Chest pain 07/13/2021   Essential hypertension 08/31/2020   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   Peripheral neuropathy 05/03/2020   COPD (chronic obstructive pulmonary disease) (HCC) 11/27/2018   Syncope 07/30/2015   AKI (acute kidney injury) (HCC) 07/30/2015   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   Sleep apnea 07/30/2015   HIV (human immunodeficiency virus infection) (HCC) 07/30/2015   Depression 07/30/2015   BPH (benign prostatic hyperplasia) 07/30/2015    Orientation RESPIRATION BLADDER Height & Weight     Self, Time, Situation, Place  Normal Continent Weight: 167 lb 8.8 oz (76 kg) Height:  5\' 6"  (167.6 cm)  BEHAVIORAL SYMPTOMS/MOOD NEUROLOGICAL BOWEL NUTRITION STATUS      Continent Diet (regular diet, thin)  AMBULATORY STATUS COMMUNICATION OF NEEDS Skin   Limited Assist Verbally Normal                       Personal Care Assistance Level of Assistance  Bathing, Feeding, Dressing Bathing Assistance: Limited assistance Feeding assistance: Independent Dressing Assistance: Limited assistance      Functional Limitations Info  Sight, Hearing, Speech Sight Info: Adequate Hearing Info: Adequate Speech Info: Adequate    SPECIAL CARE FACTORS FREQUENCY  PT (By licensed PT), OT (By licensed OT)     PT Frequency: 5x OT Frequency: 5x            Contractures Contractures Info: Not present    Additional Factors Info  Code Status, Allergies Code Status Info: DNR Allergies Info: No known allergies           Current Medications (04/29/2022):  This is the current hospital active medication list Current Facility-Administered Medications  Medication Dose Route Frequency Provider Last Rate Last Admin   abacavir-dolutegravir-lamiVUDine (TRIUMEQ) 600-50-300 MG per tablet 1 tablet  1 tablet Oral Daily 06/30/2022, MD   1 tablet at 04/29/22 1043   acetaminophen (TYLENOL) tablet 650 mg  650 mg Oral Q4H PRN 06/30/22, MD       Or   acetaminophen (TYLENOL) 160 MG/5ML solution 650 mg  650 mg Per Tube Q4H PRN Lorretta Harp, MD       Or   acetaminophen (TYLENOL) suppository 650 mg  650 mg Rectal Q4H PRN Lorretta Harp, MD       traMADol Lorretta Harp) tablet 50 mg  50 mg Oral Daily PRN Janean Sark, RPH   50 mg at 04/26/22 04/28/22   And   acetaminophen (TYLENOL) tablet 325 mg  325 mg Oral Daily PRN [id number], RPH       albuterol (PROVENTIL) (2.5  MG/3ML) 0.083% nebulizer solution 2.5 mg  2.5 mg Inhalation Q6H PRN Marrion Coy, MD       Melene Muller ON 04/30/2022] amLODipine (NORVASC) tablet 10 mg  10 mg Oral Daily Marrion Coy, MD       aspirin chewable tablet 324 mg  324 mg Oral Daily Lorretta Harp, MD   324 mg at 04/29/22 1043   atorvastatin (LIPITOR) tablet 40 mg  40 mg Oral QHS Bhagat, Srishti L, MD   40 mg at 04/28/22 2316   clonazePAM (KLONOPIN) tablet 0.5 mg  0.5 mg Oral BID PRN Lorretta Harp, MD   0.5 mg at 04/27/22 2142   clopidogrel (PLAVIX) tablet 75 mg  75 mg Oral Daily Marrion Coy, MD   75 mg at 04/29/22 1043   dextromethorphan-guaiFENesin (MUCINEX DM) 30-600 MG per 12 hr tablet 1 tablet  1 tablet  Oral BID PRN Lorretta Harp, MD       DULoxetine (CYMBALTA) DR capsule 30 mg  30 mg Oral Daily Lorretta Harp, MD   30 mg at 04/29/22 1043   dutasteride (AVODART) capsule 0.5 mg  0.5 mg Oral Daily Lorretta Harp, MD   0.5 mg at 04/29/22 1043   enoxaparin (LOVENOX) injection 40 mg  40 mg Subcutaneous Q24H Lorretta Harp, MD   40 mg at 04/28/22 2316   gatifloxacin (ZYMAXID) 0.5 % ophthalmic drops 1 drop  1 drop Right Eye QID Marrion Coy, MD   1 drop at 04/29/22 1044   hydrALAZINE (APRESOLINE) injection 5 mg  5 mg Intravenous Q2H PRN Lorretta Harp, MD       ondansetron [facility]) injection 4 mg  4 mg Intravenous Q8H PRN Lorretta Harp, MD       Oral care mouth rinse  15 mL Mouth Rinse PRN Marrion Coy, MD       pantoprazole (PROTONIX) EC tablet 40 mg  40 mg Oral Daily Lorretta Harp, MD   40 mg at 04/29/22 1043   polyvinyl alcohol (LIQUIFILM TEARS) 1.4 % ophthalmic solution 1 drop  1 drop Both Eyes PRN [place] Crane, MD   1 drop at 04/29/22 1044   pregabalin (LYRICA) capsule 50 mg  50 mg Oral QHS Lorretta Harp, MD   50 mg at 04/28/22 2316   senna-docusate (Senokot-S) tablet 1 tablet  1 tablet Oral QHS PRN Lorretta Harp, MD       tamsulosin [facility]) capsule 0.4 mg  0.4 mg Oral Daily Lorretta Harp, MD   0.4 mg at 04/29/22 1043     Discharge Medications: Please see discharge summary for a list of discharge medications.  Relevant Imaging Results:  Relevant Lab Results:   Additional Information SSN:[id number]  Maree Krabbe, LCSW

## 2022-04-29 NOTE — Progress Notes (Signed)
Occupational Therapy Treatment Patient Details Name: Brian [name] MRN: [id number] DOB: 1941/08/07 Today's Date: 04/29/2022   History of present illness Pt is an 81 yo male that presented to ED for L sided weakness and slurred speech. 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 PMH of COPD, HTN, HIV.   OT comments  Pt seen for OT tx overlapping co-tx with PT to address ADL mobility. Pt required MIN A+2 with RW to stand, MIN-MOD A +2 to take a couple steps toward the sink and to maintain static standing without UE Support at sink to wash his hands, increasing to MOD-MAX A +2 as pt fatigued in standing. Once in bed, pt instructed in meal tray preparation requiring R hand to support salad bowl and MIN A to open lid using L hand, pt able to pick up sandwich and bring to mtouh using B hands without dropping. L hand performed better when supporting the R dom hand to complete a task, required some tactile cues to limit R hand intervention to allow L hand opportunity to complete tasks. Pt continues to benefit from high intensity skilled OT services.    Recommendations for follow up therapy are one component of a multi-disciplinary discharge planning process, led by the attending physician.  Recommendations may be updated based on patient status, additional functional criteria and insurance authorization.    Follow Up Recommendations  Acute inpatient rehab (3hours/day)    Assistance Recommended at Discharge Frequent or constant Supervision/Assistance  Patient can return home with the following  A lot of help with bathing/dressing/bathroom;Assistance with cooking/housework;Direct supervision/assist for medications management;Help with stairs or ramp for entrance;Assist for transportation;Direct supervision/assist for financial management;Two people to help with walking and/or transfers;Assistance with feeding   Equipment  Recommendations  Other (comment) (defer to next venue)    Recommendations for Other Services      Precautions / Restrictions Precautions Precautions: Fall Restrictions Weight Bearing Restrictions: No       Mobility Bed Mobility               General bed mobility comments: +1 assist from PT to return to bed    Transfers Overall transfer level: Needs assistance Equipment used: Rolling walker (2 wheels) Transfers: Sit to/from Stand Sit to Stand: Min assist, +2 physical assistance           General transfer comment: MIN A+2 to stand     Balance Overall balance assessment: Needs assistance Sitting-balance support: Feet supported Sitting balance-Leahy Scale: Poor Sitting balance - Comments: required assist from PT for initial sitting balance with VC to correct for R lat lean Postural control: Right lateral lean Standing balance support: During functional activity, Single extremity supported Standing balance-Leahy Scale: Poor Standing balance comment: pt required MIN-MOD A +2 for standing at sink wihtout UE Support, increasing to MOD-MAX A +2 as pt fatigued in standing                           ADL either performed or assessed with clinical judgement   ADL Overall ADL's : Needs assistance/impaired     Grooming: Wash/dry hands;Standing Grooming Details (indicate cue type and reason): Pt required MIN-MOD A +2 for standing at sink wihtout UE Support, increasing to MOD-MAX A +2 as pt fatigued in standing  Extremity/Trunk Assessment              Vision       Perception     Praxis      Cognition Arousal/Alertness: Awake/alert Behavior During Therapy: WFL for tasks assessed/performed Overall Cognitive Status: Impaired/Different from baseline Area of Impairment: Problem solving, Safety/judgement                         Safety/Judgement: Decreased awareness of safety, Decreased  awareness of deficits   Problem Solving: Decreased initiation, Difficulty sequencing, Requires verbal cues, Requires tactile cues          Exercises Other Exercises Other Exercises: Pt instructed in meal tray preparation requiring R hand to support salad bowl and MIN A to open lid using L hand, pt able to pick up sandwich and bring to mtouh using B hands without dropping. L hand performed better when supporting the R dom hand to complete a task, required some tactile cues to limit R hand intervention to allow L hand opportunity to complete tasks.    Shoulder Instructions       General Comments      Pertinent Vitals/ Pain       Pain Assessment Pain Assessment: No/denies pain  Home Living                                          Prior Functioning/Environment              Frequency  Min 4X/week        Progress Toward Goals  OT Goals(current goals can now be found in the care plan section)  Progress towards OT goals: Progressing toward goals  Acute Rehab OT Goals Patient Stated Goal: get better OT Goal Formulation: With patient/family Time For Goal Achievement: 05/09/22 Potential to Achieve Goals: Fair  Plan Discharge plan remains appropriate;Frequency remains appropriate    Co-evaluation    PT/OT/SLP Co-Evaluation/Treatment: Yes Reason for Co-Treatment: For patient/therapist safety;To address functional/ADL transfers PT goals addressed during session: Mobility/safety with mobility;Balance;Proper use of DME OT goals addressed during session: Proper use of Adaptive equipment and DME;ADL's and self-care      AM-PAC OT "6 Clicks" Daily Activity     Outcome Measure   Help from another person eating meals?: A Little Help from another person taking care of personal grooming?: A Little Help from another person toileting, which includes using toliet, bedpan, or urinal?: A Lot Help from another person bathing (including washing, rinsing, drying)?:  A Lot Help from another person to put on and taking off regular upper body clothing?: A Little Help from another person to put on and taking off regular lower body clothing?: A Lot 6 Click Score: 15    End of Session Equipment Utilized During Treatment: Gait belt;Rolling walker (2 wheels)  OT Visit Diagnosis: Unsteadiness on feet (R26.81);Repeated falls (R29.6);Muscle weakness (generalized) (M62.81);Other symptoms and signs involving the nervous system (R29.898)   Activity Tolerance Patient tolerated treatment well   Patient Left in bed;with call bell/phone within reach;with bed alarm set;with family/visitor present   Nurse Communication          Time: 3254-9826 OT Time Calculation (min): 15 min  Charges: OT General Charges $OT Visit: 1 Visit OT Treatments $Self Care/Home Management : 8-22 mins  Arman Filter., MPH, MS, OTR/L ascom [phone] 04/29/22, 1:58 PM

## 2022-04-29 NOTE — Progress Notes (Signed)
Speech Language Pathology Treatment: Cognitive-Linquistic  Patient Details Name: Brian [name] MRN: [id number] DOB: [birth year]   Today's Date: 04/29/2022 Time: 3149-7026 SLP Time Calculation (min) (ACUTE ONLY): 45 min  Assessment / Plan / Recommendation Clinical Impression  Patient seen for skilled ST treatment for dysarthria and ongoing assessment of language function. Patient's son present; session was administered in Spanish by SLP graduate student clinician. Pt's verbal output today greatly improved from previous evaluation, suspect due to improvement in frustration tolerance. Speech remains characterized by slurred speech, also noted low volume and ongoing hoarse vocal quality. Intelligibility without strategies judged to be 60%. Son reports some baseline changes in vocal quality and volume months prior to this hospitalization. Education provided on dysarthria and dysarthria compensations and provided in handout in [place]. Clinician facilitated use of dysarthria compensations with focus on slow rate, pausing, louder speech, and increased movements. Patient required max cues including verbal, visual and demonstration cues, and initially utilized strategies only 20% of the time with rote phrases. Patient was noted to be holding his throat and reported this was to increase his volume. Clinician provided training in breath support to increase volume, with immediate improvement in vocal quality, loudness and intelligibility (80%). Patient able to return demonstration 50% of the time with mod cues. Further assessment of language using picture cards to promote verbal description (sequencing, inferencing) reveals adequate language function at sentence level, though mildly slowed processing is noted. Patient responding in sentences and generating conversation re: familiar activities with WNL language function.   During session, pt was noted with subtle cough x1 when sipping liquid via straw. Pt passed [facility]  swallow screen and per discussion with RN, no difficulties reported at mealtimes or when taking medications. D/w RN to monitor and place SLP order if any signs of dysphagia exhibited with POs.   HPI HPI: Pt is an 81 yo male that presented to ED for L sided weakness and slurred speech. Neurology consult pending, initial note indicates possible posterior circulation versus right hemispheric stroke. PMH includes COPD, HTN, HIV. CTA head and neck: "1. No emergent large vessel occlusion.2. Moderate left P2 PCA stenosis.3. 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 Partially imaged ground-glass opacities in the visualized lung apices, which could represent expiratory changes/atelectasis and/or pneumonia. CT of the chest could further evaluate if clinically warranted." No MRI d/t pacemaker.      SLP Plan  Continue with current plan of care      Recommendations for follow up therapy are one component of a multi-disciplinary discharge planning process, led by the attending physician.  Recommendations may be updated based on patient status, additional functional criteria and insurance authorization.    Recommendations         Oral Care Recommendations: Oral care BID Follow Up Recommendations: Skilled nursing-short term rehab (<3 hours/day) Assistance recommended at discharge: Frequent or constant Supervision/Assistance SLP Visit Diagnosis: Dysarthria and anarthria (R47.1);Aphasia (R47.01);Cognitive communication deficit (V78.588) Plan: Continue with current plan of care         Rondel Baton, MS, CCC-SLP Speech-Language Pathologist [phone]   Arlana Lindau  04/29/2022, 3:48 PM

## 2022-04-29 NOTE — TOC Initial Note (Signed)
Transition of Care [facility]) - Initial/Assessment Note    Patient Details  Name: Brian [name] MRN: [id number] Date of Birth: 1941-08-30  [facility]) CM/SW Contact:    Maree Krabbe, LCSW Phone Number: 04/29/2022, 1:05 PM  Clinical Narrative:    CSW spoke with pt's daughter regarding placement. Pt's daughter does not have a preference but would prefer it be in [place]. CSW got verbal permission to send referral to [facility] facilities and will provide pt's daughter with bed offers once available.                Expected Discharge Plan: Skilled Nursing Facility Barriers to Discharge: Continued Medical Work up   Patient Goals and CMS Choice Patient states their goals for this hospitalization and ongoing recovery are:: to get better   Choice offered to / list presented to : Adult Children  Expected Discharge Plan and Services Expected Discharge Plan: Skilled Nursing Facility In-house Referral: Clinical Social Work   Post Acute Care Choice: Skilled Nursing Facility Living arrangements for the past 2 months: Single Family Home                                      Prior Living Arrangements/Services Living arrangements for the past 2 months: Single Family Home Lives with:: Self Patient language and need for interpreter reviewed:: Yes Do you feel safe going back to the place where you live?: Yes      Need for Family Participation in Patient Care: Yes (Comment) Care giver support system in place?: Yes (comment)   Criminal Activity/Legal Involvement Pertinent to Current Situation/Hospitalization: No - Comment as needed  Activities of Daily Living Home Assistive Devices/Equipment: Oxygen ADL Screening (condition at time of admission) Patient's cognitive ability adequate to safely complete daily activities?: Yes Is the patient deaf or have difficulty hearing?: No Does the patient have difficulty seeing, even when wearing glasses/contacts?: No Does the patient  have difficulty concentrating, remembering, or making decisions?: Yes Patient able to express need for assistance with ADLs?: Yes Does the patient have difficulty dressing or bathing?: No Independently performs ADLs?: Yes (appropriate for developmental age) Does the patient have difficulty walking or climbing stairs?: Yes Weakness of Legs: Both Weakness of Arms/Hands: None  Permission Sought/Granted Permission sought to share information with : Family Supports Permission granted to share information with : Yes, Release of Information Signed  Share Information with NAME: Brian [name]     Permission granted to share info w Relationship: daughter     Emotional Assessment Appearance:: Appears stated age Attitude/Demeanor/Rapport: Engaged Affect (typically observed): Accepting Orientation: : Oriented to Self, Oriented to Place, Oriented to  Time, Oriented to Situation Alcohol / Substance Use: Not Applicable Psych Involvement: No (comment)  Admission diagnosis:  Stroke [facility]) [I63.9] Cerebrovascular accident (CVA), unspecified mechanism (HCC) [I63.9] Patient Active Problem List   Diagnosis Date Noted   Conjunctivitis 04/27/2022   Stroke (HCC) 04/25/2022   Hyperlipidemia    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 [place] [address] Phone: [phone] Fax:  [phone]  [id number] [place], [place] - [address] [place] [place] [address] Phone: [phone] Fax: [phone]     Social Determinants of Health (SDOH) Interventions    Readmission Risk Interventions     No data to display

## 2022-04-29 NOTE — Progress Notes (Addendum)
Physical Therapy Treatment Patient Details Name: Brian [name] MRN: [id number] DOB: 19-Dec-1940 Today's Date: 04/29/2022   History of Present Illness Pt is an 81 yo male that presented to ED for L sided weakness and slurred speech. 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 PMH of COPD, HTN, HIV.    PT Comments    Pt in bed.  OK to proceed with therapies per MD despite increased BP.  To EOB with min a x 1.  Occasional min assist/cues to correct imbalances with seated AROM BLE and static sitting.  OT arrived and co-tx continued for improved outcomes.  He is able to stand with mina x 2 and take a few steps forward to sink to wash hands.  Standing with walker and min a x 2 but without UE support he needs mod/max a x 2 especially as he fatigues.  After short seated rest he begins to initiate transition to supine but he agrees to stand a second time and is able to take several sidesteps towards right to improve position.  Returns to supine per his request due to fatigue with min a x 1 and OT remains in room for continued session.  Discharge recommendations changed to SNF as family cannot provide 24 hour assist at discharge and CIR is no longer looking at pt per TOC notes.   Recommendations for follow up therapy are one component of a multi-disciplinary discharge planning process, led by the attending physician.  Recommendations may be updated based on patient status, additional functional criteria and insurance authorization.  Follow Up Recommendations  Skilled nursing-short term rehab (<3 hours/day) Can patient physically be transported by private vehicle: No   Assistance Recommended at Discharge Frequent or constant Supervision/Assistance  Patient can return home with the following A lot of help with walking and/or transfers;A lot of help with bathing/dressing/bathroom;Assistance with cooking/housework;Direct  supervision/assist for financial management;Assist for transportation;Help with stairs or ramp for entrance;Direct supervision/assist for medications management   Equipment Recommendations       Recommendations for Other Services       Precautions / Restrictions Precautions Precautions: Fall Restrictions Weight Bearing Restrictions: No Other Position/Activity Restrictions: BP     Mobility  Bed Mobility Overal bed mobility: Needs Assistance Bed Mobility: Supine to Sit, Sit to Supine     Supine to sit: Min assist Sit to supine: Min assist        Transfers Overall transfer level: Needs assistance Equipment used: Rolling walker (2 wheels) Transfers: Sit to/from Stand Sit to Stand: Min assist, +2 physical assistance                Ambulation/Gait Ambulation/Gait assistance: Min assist, Mod assist, +2 physical assistance Gait Distance (Feet): 4 Feet Assistive device: Rolling walker (2 wheels) Gait Pattern/deviations: Step-to pattern Gait velocity: very decreased     General Gait Details: sidesteps along bed to reposition.   Stairs             Wheelchair Mobility    Modified Rankin (Stroke Patients Only)       Balance Overall balance assessment: Needs assistance Sitting-balance support: Feet supported Sitting balance-Leahy Scale: Fair Sitting balance - Comments: occasional min assist to correct posture Postural control: Posterior lean Standing balance support: During functional activity, Single extremity supported Standing balance-Leahy Scale: Poor Standing balance comment: pt required MIN-MOD A +2 for standing at sink wihtout UE Support, increasing to MOD-MAX A +2 as pt fatigued in  standing                            Cognition Arousal/Alertness: Awake/alert Behavior During Therapy: WFL for tasks assessed/performed Overall Cognitive Status: Within Functional Limits for tasks assessed                                           Exercises      General Comments        Pertinent Vitals/Pain Pain Assessment Pain Assessment: No/denies pain    Home Living                          Prior Function            PT Goals (current goals can now be found in the care plan section) Progress towards PT goals: Progressing toward goals    Frequency    7X/week      PT Plan Discharge plan needs to be updated    Co-evaluation PT/OT/SLP Co-Evaluation/Treatment: Yes Reason for Co-Treatment: Complexity of the patient's impairments (multi-system involvement);To address functional/ADL transfers PT goals addressed during session: Mobility/safety with mobility;Strengthening/ROM OT goals addressed during session: ADL's and self-care      AM-PAC PT "6 Clicks" Mobility   Outcome Measure  Help needed turning from your back to your side while in a flat bed without using bedrails?: A Little Help needed moving from lying on your back to sitting on the side of a flat bed without using bedrails?: A Little Help needed moving to and from a bed to a chair (including a wheelchair)?: A Lot Help needed standing up from a chair using your arms (e.g., wheelchair or bedside chair)?: A Little Help needed to walk in hospital room?: A Lot Help needed climbing 3-5 steps with a railing? : Total 6 Click Score: 14    End of Session Equipment Utilized During Treatment: Gait belt Activity Tolerance: Patient tolerated treatment well Patient left: in bed;with call bell/phone within reach;with bed alarm set;with family/visitor present Nurse Communication: Mobility status PT Visit Diagnosis: Other abnormalities of gait and mobility (R26.89);Difficulty in walking, not elsewhere classified (R26.2);Muscle weakness (generalized) (M62.81)     Time: 7371-0626 PT Time Calculation (min) (ACUTE ONLY): 17 min  Charges:  $Therapeutic Activity: 8-22 mins                   Danielle Dess, PTA 04/29/22, 2:36 PM

## 2022-04-29 NOTE — Progress Notes (Addendum)
  Progress Note   Patient: Brian [name] TML:[id number] DOB: 03/20/1941 DOA: 04/25/2022     4 DOS: the patient was seen and examined on 04/29/2022   Brief hospital course: Brian [name] is a 81 y.o. male with medical history significant of s/p for pacemaker placement, hypertension, hyperlipidemia, COPD using as needed oxygen at night, depression with anxiety, HIV, bradycardia, BPH, OSA not using CPAP, who presents with left-sided weakness and difficulty speaking. Patient is a seen by neurology, symptoms consistent with stroke.  CT angiogram of the neck and head is negative for major occlusion. Could not do MRI due to pacemaker.  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  Patient does not need surgery per vascular surgery.  Also cleared to give Plavix 300 mg loading dose followed by 75 mg daily. Echocardiogram showed ejection fraction 55 to 60%, with grade 2 diastolic dysfunction. Patient is also evaluated by PT/OT, recommended nursing home placement.  Assessment and Plan: Acute stroke. Essential hypertension. 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 Patient is evaluated by neurology, acute stroke appears to be caused by embolism from aortic arch. Continue aspirin and Plavix after loading dose of Plavix.   Condition improved, currently pending nursing home placement. Given mild left atrial dilation, neurology has recommended 30-day event recorder.  Patient has a pacemaker,  can follow up with the PCP, his PCP does his own pacemaker interrogation..   Right eye conjunctivitis. Condition improved, continue antibiotics ointment.   Essential hypertension. Increase amlodipine to 10 mg daily  HIV. Continue home medicines, follow-up with PCP.  COPD. Condition stable.        Subjective:  Patient doing well, right eye pain improved.  Physical Exam: Vitals:   04/28/22 2146 04/29/22 0019 04/29/22 0535 04/29/22 0802  BP: (!) 158/76 (!)  157/93 (!) 165/99 (!) 188/99  Pulse: (!) 59 70 80 69  Resp: 16 18 18 20   Temp: 98.6 F (37 C) 99.2 F (37.3 C) 98.5 F (36.9 C) 98.5 F (36.9 C)  TempSrc:      SpO2: 96% 96% 99% 94%  Weight:      Height:       General exam: Appears calm and comfortable  Respiratory system: Clear to auscultation. Respiratory effort normal. Cardiovascular system: S1 & S2 heard, RRR. No JVD, murmurs, rubs, gallops or clicks. No pedal edema. Gastrointestinal system: Abdomen is nondistended, soft and nontender. No organomegaly or masses felt. Normal bowel sounds heard. Central nervous system: Alert and oriented x2. No focal neurological deficits. Extremities: Symmetric 5 x 5 power. Skin: No rashes, lesions or ulcers Psychiatry: Judgement and insight appear normal. Mood & affect appropriate.   Data Reviewed:  There are no new results to review at this time.  Family Communication: Daughter-in-law in the room.  Disposition: Status is: Inpatient Remains inpatient appropriate because: Unsafe discharge.  Planned Discharge Destination: Skilled nursing facility    Time spent: 25 minutes  Author: , MD 04/29/2022 12:17 PM  For on call review [email or web address].

## 2022-04-30 DIAGNOSIS — H1031 Unspecified acute conjunctivitis, right eye: Secondary | ICD-10-CM | POA: Diagnosis not present

## 2022-04-30 DIAGNOSIS — I6312 Cerebral infarction due to embolism of basilar artery: Secondary | ICD-10-CM | POA: Diagnosis not present

## 2022-04-30 DIAGNOSIS — I10 Essential (primary) hypertension: Secondary | ICD-10-CM | POA: Diagnosis not present

## 2022-04-30 LAB — CBC
HCT: 45.6 % (ref 39.0–52.0)
Hemoglobin: 15.7 g/dL (ref 13.0–17.0)
MCH: 30.3 pg (ref 26.0–34.0)
MCHC: 34.4 g/dL (ref 30.0–36.0)
MCV: 87.9 fL (ref 80.0–100.0)
Platelets: 225 10*3/uL (ref 150–400)
RBC: 5.19 MIL/uL (ref 4.22–5.81)
RDW: 13.1 % (ref 11.5–15.5)
WBC: 9.4 10*3/uL (ref 4.0–10.5)
nRBC: 0 % (ref 0.0–0.2)

## 2022-04-30 MED ORDER — SIMETHICONE 80 MG PO CHEW
80.0000 mg | CHEWABLE_TABLET | Freq: Four times a day (QID) | ORAL | Status: DC | PRN
  Administered 2022-05-01 – 2022-05-03 (×2): 80 mg via ORAL
  Filled 2022-04-30 (×3): qty 1

## 2022-04-30 NOTE — Plan of Care (Signed)

## 2022-04-30 NOTE — Progress Notes (Signed)
Physical Therapy Treatment Patient Details Name: Brian [name] MRN: [id number] DOB: September 17, 1941 Today's Date: 04/30/2022   History of Present Illness Pt is an 81 yo male that presented to ED for L sided weakness and slurred speech. 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 PMH of COPD, HTN, HIV.    PT Comments    Pt presents alert and oriented supine in bed with daughter in law present. Pt exhibits improvement with mobility with ability to ambulate with decreased level of support from past sessions. He required min A to SBA while ambulating from bed to bathroom with intermittent VC to progress hemiparetic LLE and to maintain proximity to AD. Pt also shows improved activity tolerance and LE strength with ability to perform toe taps on 1 ft step with BUE support using FWW. Daughter in law reports that daughter will be with pt tomorrow and she will need interpreter because her English is not good. PT still recommending d/c to SNF with additional PT to improve pt's mobility and balance to return to PLOF and to decrease caregiver burden.     Recommendations for follow up therapy are one component of a multi-disciplinary discharge planning process, led by the attending physician.  Recommendations may be updated based on patient status, additional functional criteria and insurance authorization.  Follow Up Recommendations  Skilled nursing-short term rehab (<3 hours/day) Can patient physically be transported by private vehicle: No (However, today's session indicated possibility to achieve ambulatory and sitting functional requirements)   Assistance Recommended at Discharge    Patient can return home with the following A lot of help with walking and/or transfers;A lot of help with bathing/dressing/bathroom;Assistance with cooking/housework;Direct supervision/assist for financial management;Assist for transportation;Help  with stairs or ramp for entrance;Direct supervision/assist for medications management   Equipment Recommendations  Rolling walker (2 wheels)    Recommendations for Other Services       Precautions / Restrictions Precautions Precautions: Fall Precaution Comments: Chair alarm set after leaving session Restrictions Weight Bearing Restrictions: No Other Position/Activity Restrictions: Doctor gave ok despite elevated BP     Mobility  Bed Mobility Overal bed mobility: Modified Independent Bed Mobility: Supine to Sit     Supine to sit: Modified independent (Device/Increase time) Sit to supine: Modified independent (Device/Increase time)   General bed mobility comments: Increased cueing on how to perform roll or move feet side of bed    Transfers Overall transfer level: Needs assistance Equipment used: Rolling walker (2 wheels) Transfers: Sit to/from Stand Sit to Stand: Min assist, Supervision   Step pivot transfers: Min assist       General transfer comment: Able perform one  stand with mod A and other stands requiring min A.    Ambulation/Gait Ambulation/Gait assistance: Modified independent (Device/Increase time), Min assist Gait Distance (Feet): 20 Feet Assistive device: Rolling walker (2 wheels) Gait Pattern/deviations: Step-through pattern, Decreased stance time - left, Decreased step length - right, Decreased weight shift to left Gait velocity: very decreased     General Gait Details: Left sided hemiparesis of LLE. Difficulty advancing LLE and maintaining smooth cadence   Stairs Stairs: Yes Stairs assistance: Modified independent (Device/Increase time)   Number of Stairs: 1 General stair comments: Used 1 foot step platform brought into patient's room and had patient perform 8 alternating foot taps onto platform   Wheelchair Mobility    Modified Rankin (Stroke Patients Only) Modified Rankin (Stroke Patients Only) Pre-Morbid Rankin Score: Moderately  severe disability Modified Rankin: Moderately severe disability     Balance Overall balance assessment: Needs assistance Sitting-balance support: Feet supported Sitting balance-Leahy Scale: Fair   Postural control: Posterior lean Standing balance support: Bilateral upper extremity supported Standing balance-Leahy Scale: Fair Standing balance comment: supervision with use of bilateral UE support with FWW                            Cognition Arousal/Alertness: Awake/alert Behavior During Therapy: WFL for tasks assessed/performed Overall Cognitive Status: Within Functional Limits for tasks assessed (Daughter in law says he is responding like he normally does.) Area of Impairment: Safety/judgement                         Safety/Judgement: Decreased awareness of safety     General Comments: Daughter in law states that patient understands English but cannot speak it        Exercises Other Exercises Other Exercises: LAQ 2 x 10 Other Exercises: Ankle Pumps 2 x 10 Other Exercises: Hip Marches 2 x 10 Other Exercises: Toe Taps on 1 ft platform 2 x 10    General Comments        Pertinent Vitals/Pain Pain Assessment Pain Assessment: No/denies pain    Home Living                          Prior Function            PT Goals (current goals can now be found in the care plan section) Acute Rehab PT Goals Patient Stated Goal: to go home PT Goal Formulation: With patient Time For Goal Achievement: 05/09/22 Potential to Achieve Goals: Fair Progress towards PT goals: Progressing toward goals    Frequency    7X/week      PT Plan Current plan remains appropriate    Co-evaluation              AM-PAC PT "6 Clicks" Mobility   Outcome Measure  Help needed turning from your back to your side while in a flat bed without using bedrails?: A Little Help needed moving from lying on your back to sitting on the side of a flat bed without  using bedrails?: A Little Help needed moving to and from a bed to a chair (including a wheelchair)?: A Lot Help needed standing up from a chair using your arms (e.g., wheelchair or bedside chair)?: A Little Help needed to walk in hospital room?: A Little Help needed climbing 3-5 steps with a railing? : Total 6 Click Score: 15    End of Session Equipment Utilized During Treatment: Gait belt Activity Tolerance: Patient tolerated treatment well Patient left: in chair;with call bell/phone within reach;with family/visitor present;with chair alarm set Nurse Communication: Mobility status PT Visit Diagnosis: Other abnormalities of gait and mobility (R26.89);Difficulty in walking, not elsewhere classified (R26.2);Muscle weakness (generalized) (M62.81)     Time: 2992-4268 PT Time Calculation (min) (ACUTE ONLY): 35 min  Charges:  $Gait Training: 8-22 mins $Therapeutic Exercise: 8-22 mins                     Ellin Goodie PT, DPT  04/30/2022, 2:44 PM

## 2022-04-30 NOTE — Progress Notes (Signed)
  Progress Note   Patient: Brian [name] ZOX:[id number] DOB: 01-Oct-1941 DOA: 04/25/2022     5 DOS: the patient was seen and examined on 04/30/2022   Brief hospital course: Brian [name] is a 81 y.o. male with medical history significant of s/p for pacemaker placement, hypertension, hyperlipidemia, COPD using as needed oxygen at night, depression with anxiety, HIV, bradycardia, BPH, OSA not using CPAP, who presents with left-sided weakness and difficulty speaking. Patient is a seen by neurology, symptoms consistent with stroke.  CT angiogram of the neck and head is negative for major occlusion. Could not do MRI due to pacemaker.  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  Patient does not need surgery per vascular surgery.  Also cleared to give Plavix 300 mg loading dose followed by 75 mg daily. Echocardiogram showed ejection fraction 55 to 60%, with grade 2 diastolic dysfunction. Patient is also evaluated by PT/OT, recommended nursing home placement.  Assessment and Plan: Acute stroke. Essential hypertension. 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 Patient is evaluated by neurology, acute stroke appears to be caused by embolism from aortic arch. Continue aspirin and Plavix after loading dose of Plavix.   Condition improved, currently pending nursing home placement. Given mild left atrial dilation, neurology has recommended 30-day event recorder.  Patient has a pacemaker, I have reached out to [facility], patient primary care physician who does his own pacemaker interrogation.  Patient can follow-up with him in 1 to 2 weeks for this.    Right eye conjunctivitis. Evaluated by ophthalmology, no orbital involvement.  Condition had improved, continue antibiotics ointment for 7 days.  Last dose 7/7.   Essential hypertension. Amlodipine started.  Continue to follow.  HIV. Continue home medicines, follow-up with PCP.  COPD. Condition stable.         Subjective:  Patient is doing well, currently pending nursing home placement.  Physical Exam: Vitals:   04/29/22 2234 04/30/22 0053 04/30/22 0616 04/30/22 0902  BP: (!) 167/95 (!) 160/108 (!) 144/104 (!) 158/102  Pulse: (!) 56 73 78 64  Resp: 16 18 18 18   Temp: 97.8 F (36.6 C) 97.6 F (36.4 C) 97.6 F (36.4 C) 98.2 F (36.8 C)  TempSrc:      SpO2: 99% 96% 95% 100%  Weight:      Height:       General exam: Appears calm and comfortable  Respiratory system: Clear to auscultation. Respiratory effort normal. Cardiovascular system: S1 & S2 heard, RRR. No JVD, murmurs, rubs, gallops or clicks. No pedal edema. Gastrointestinal system: Abdomen is nondistended, soft and nontender. No organomegaly or masses felt. Normal bowel sounds heard. Central nervous system: Alert and oriented x2. No focal neurological deficits. Extremities: Symmetric 5 x 5 power. Skin: No rashes, lesions or ulcers Psychiatry: Judgement and insight appear normal. Mood & affect appropriate.   Data Reviewed:  Lab results reviewed.  Family Communication: Daughter-in-law updated in the room.  Disposition: Status is: Inpatient Remains inpatient appropriate because: Unsafe discharge  Planned Discharge Destination: Skilled nursing facility    Time spent: 35 minutes  Author: , MD 04/30/2022 12:14 PM  For on call review [email or web address].

## 2022-04-30 NOTE — TOC Progression Note (Signed)
Transition of Care [facility]) - Progression Note    Patient Details  Name: Zamarian Scarano MRN: [id number] Date of Birth: August 30, 1941  [facility]) CM/SW Contact  Maree Krabbe, LCSW Phone Number: 04/30/2022, 3:00 PM  Clinical Narrative:   Provided bed offers to pt's daughter. She states she will review and ask that CSW follow up with her on choice.    Expected Discharge Plan: Skilled Nursing Facility Barriers to Discharge: Continued Medical Work up  Expected Discharge Plan and Services Expected Discharge Plan: Skilled Nursing Facility In-house Referral: Clinical Social Work   Post Acute Care Choice: Skilled Nursing Facility Living arrangements for the past 2 months: Single Family Home                                       Social Determinants of Health (SDOH) Interventions    Readmission Risk Interventions     No data to display

## 2022-05-01 DIAGNOSIS — I719 Aortic aneurysm of unspecified site, without rupture: Secondary | ICD-10-CM | POA: Diagnosis not present

## 2022-05-01 DIAGNOSIS — I6312 Cerebral infarction due to embolism of basilar artery: Secondary | ICD-10-CM | POA: Diagnosis not present

## 2022-05-01 MED ORDER — CLONAZEPAM 0.5 MG PO TABS: 0.5000 mg | ORAL_TABLET | Freq: Two times a day (BID) | ORAL | 0 refills | Status: DC | PRN

## 2022-05-01 MED ORDER — TRAMADOL-ACETAMINOPHEN 37.5-325 MG PO TABS: 1.0000 | ORAL_TABLET | Freq: Every day | ORAL | 0 refills | Status: AC | PRN

## 2022-05-01 MED ORDER — AMLODIPINE BESYLATE 10 MG PO TABS: 10.0000 mg | ORAL_TABLET | Freq: Every day | ORAL | Status: DC

## 2022-05-01 NOTE — Progress Notes (Signed)
PROGRESS NOTE    Brian [name]  NWG:[id number] DOB: 07-04-1941 DOA: 04/25/2022 PCP: Armando Gang, FNP    Brief Narrative:  81 year old with history of bradycardia status post pacemaker, hypertension, hyperlipidemia, COPD, OSA not using CPAP at night, Spanish-speaking presented to the emergency room with left-sided weakness and difficulty speaking.  Admitted as code stroke.  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  Seen and followed by neurology and vascular surgery.  Recommended dual antiplatelet therapy.  He is working with rehab, recommended SNF placement.  Waiting for bed availability.   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 CT head findings, no acute abnormalities.  Small vessel disease. MRI of the brain, unable to do.  Pacemaker not compatible to MRI. CT angiogram head and neck, no emergent large vessel occlusion.  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 2D echocardiogram, ejection fraction 55 to 60%.  Grade 2 diastolic dysfunction.  No intracardiac thrombus. Antiplatelet therapy, none at home.  Neurology recommended aspirin 324 mg daily, Plavix 75 mg daily after loading dose for 3 months.  Will send neurology referral on discharge. LDL 86.  On atorvastatin that is continued.  Hemoglobin A1c 5.8.  No indication for treatment. Speech, regular diet. PT OT, recommended SNF.  Waiting for bed. Vascular surgery and neurology, recommended conservative management. Recommended event monitoring for thromboembolism, previous attending has discussed with Dr. Harl Bowie who is going to interrogate pacemaker for events after discharge.  Right eye conjunctivitis: Evaluated  by ophthalmology.  On antibiotics ointment till 7/7.  Improved.  Essential hypertension: Started on amlodipine.  Stable.  HIV infection: Stable.  On Haart therapy.  COPD: Stable.  Chronic pain anxiety: Patient occasionally uses tramadol and Klonopin for pain relief and anxiety.  Verified with outpatient records.  Will prescribe short-term therapies.  Medically stable to transfer to SNF.  DVT prophylaxis: enoxaparin (LOVENOX) injection 40 mg Start: 04/25/22 2200   Code Status: DNR Family Communication: Daughter, granddaughter at the bedside Disposition Plan: Status is: Inpatient Remains inpatient appropriate because: Waiting for SNF     Consultants:  Neurology Vascular  Procedures:  None  Antimicrobials:  Ophthalmic ointment   Subjective: Patient seen and examined.  Was brushing his teeth.  He tells me he is fine.  Family at the bedside tells me that he is more quiet and composed and not experiencing any trouble now.  He lives alone.  He still needing a lot of support to walk.  Objective: Vitals:   04/30/22 1612 04/30/22 2054 05/01/22 0500 05/01/22 0826  BP: 140/76 (!) 173/85 (!) 162/103 (!) 162/89  Pulse: 67 65 67 60  Resp: 16 15 20 19   Temp: 97.8 F (36.6 C) 98.7 F (37.1 C) 97.7 F (36.5 C) 97.7 F (36.5 C)  TempSrc:      SpO2: 99% 95% 97% 97%  Weight:      Height:        Intake/Output Summary (Last 24 hours) at 05/01/2022 1138 Last data filed at 05/01/2022 1032 Gross per 24 hour  Intake 480 ml  Output 100 ml  Net 380 ml   Filed Weights   04/25/22 0744  Weight: 76 kg    Examination:  General exam: Appears calm and comfortable  Sitting in  chair.  Speech is clear.  Alert and awake.  He is oriented. Respiratory system: Clear to auscultation. Respiratory effort normal. Cardiovascular system: S1 & S2 heard, RRR.  Pacemaker in place.   Gastrointestinal system: Soft.  Nontender.   Central nervous system: Alert and oriented.  Mild left facial  droop. Left upper and lower extremity 4/5.  Weaker than right side.    Data Reviewed: I have personally reviewed following labs and imaging studies  CBC: Recent Labs  Lab 04/25/22 0739 04/30/22 0504  WBC 6.5 9.4  NEUTROABS 4.8  --   HGB 13.1 15.7  HCT 38.4* 45.6  MCV 88.9 87.9  PLT 202 225   Basic Metabolic Panel: Recent Labs  Lab 04/25/22 0739  NA 138  K 3.6  CL 110  CO2 22  GLUCOSE 121*  BUN 11  CREATININE 0.92  CALCIUM 8.7*   GFR: Estimated Creatinine Clearance: 56.8 mL/min (by C-G formula based on SCr of 0.92 mg/dL). Liver Function Tests: Recent Labs  Lab 04/25/22 0739  AST 28  ALT 21  ALKPHOS 42  BILITOT 0.4  PROT 7.2  ALBUMIN 3.5   No results for input(s): "LIPASE", "AMYLASE" in the last 168 hours. No results for input(s): "AMMONIA" in the last 168 hours. Coagulation Profile: Recent Labs  Lab 04/25/22 0739  INR 1.1   Cardiac Enzymes: No results for input(s): "CKTOTAL", "CKMB", "CKMBINDEX", "TROPONINI" in the last 168 hours. BNP (last 3 results) No results for input(s): "PROBNP" in the last 8760 hours. HbA1C: No results for input(s): "HGBA1C" in the last 72 hours. CBG: Recent Labs  Lab 04/25/22 0726  GLUCAP 116*   Lipid Profile: No results for input(s): "CHOL", "HDL", "LDLCALC", "TRIG", "CHOLHDL", "LDLDIRECT" in the last 72 hours. Thyroid Function Tests: No results for input(s): "TSH", "T4TOTAL", "FREET4", "T3FREE", "THYROIDAB" in the last 72 hours. Anemia Panel: No results for input(s): "VITAMINB12", "FOLATE", "FERRITIN", "TIBC", "IRON", "RETICCTPCT" in the last 72 hours. Sepsis Labs: No results for input(s): "PROCALCITON", "LATICACIDVEN" in the last 168 hours.  No results found for this or any previous visit (from the past 240 hour(s)).       Radiology Studies: No results found.      Scheduled Meds:  abacavir-dolutegravir-lamiVUDine  1 tablet Oral Daily   amLODipine  10 mg Oral Daily   aspirin  324 mg Oral Daily    atorvastatin  40 mg Oral QHS   clopidogrel  75 mg Oral Daily   DULoxetine  30 mg Oral Daily   dutasteride  0.5 mg Oral Daily   enoxaparin (LOVENOX) injection  40 mg Subcutaneous Q24H   gatifloxacin  1 drop Right Eye QID   pantoprazole  40 mg Oral Daily   pregabalin  50 mg Oral QHS   tamsulosin  0.4 mg Oral Daily   Continuous Infusions:   LOS: 6 days    Time spent: 35 minutes    Dorcas Carrow, MD [facility] Pager [phone]

## 2022-05-01 NOTE — Care Management Important Message (Signed)
Important Message  Patient Details  Name: Brian [name] MRN: [id number] Date of Birth: 1940/11/30   Medicare Important Message Given:  Yes     [name] A Teyton Pattillo 05/01/2022, 2:50 PM

## 2022-05-01 NOTE — TOC Progression Note (Signed)
Transition of Care [facility]) - Progression Note    Patient Details  Name: Brian [name] MRN: [id number] Date of Birth: 1941-02-01  Transition of Care [facility]) CM/SW Contact  Truddie Hidden, RN Phone Number: 05/01/2022, 1:58 PM  Clinical Narrative:    Spoke with patient's daughter regarding choice for bed. [facility] has [facility]   Expected Discharge Plan: Skilled Nursing Facility Barriers to Discharge: Continued Medical Work up  Expected Discharge Plan and Services Expected Discharge Plan: Skilled Nursing Facility In-house Referral: Clinical Social Work   Post Acute Care Choice: Skilled Nursing Facility Living arrangements for the past 2 months: Single Family Home                                       Social Determinants of Health (SDOH) Interventions    Readmission Risk Interventions     No data to display

## 2022-05-01 NOTE — Progress Notes (Signed)
Occupational Therapy Treatment Patient Details Name: Brian [name] MRN: [id number] DOB: 07/02/1941 Today's Date: 05/01/2022   History of present illness Pt is an 81 yo male that presented to ED for L sided weakness and slurred speech. 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 PMH of COPD, HTN, HIV.   OT comments  Chart reviewed, pt greeted in room with grand daughter present (he requests grand daughter to translate). Tx session targeted progressing functional mobility tolerance and ADL independence. Pt appears alert and oriented x4, requests to amb to bathroom. Supine>sit performed with MOD A with poor sitting balance noted. STS with MIN A, amb to bathroom with RW with MIN A. LLE noted to be dragging with pt appearing more lethargic. Toileting completed with MIN A and red/brown urine noted following BM/urination. RN present to assess. MD notified. Pt returned to bed, completed oral care in standing, reaching outward with MIN A. Pt is left in bed, eating lunch/popsicle with grand daugther present. Spo2 94% on RA. Pt is left as received, NAD, all needs met. OT will follow acutely.   Pt would be an excellent IPR candidate however per chart review family requests STR placement. Discharge recommendation has been updated.    Recommendations for follow up therapy are one component of a multi-disciplinary discharge planning process, led by the attending physician.  Recommendations may be updated based on patient status, additional functional criteria and insurance authorization.    Follow Up Recommendations  Skilled nursing-short term rehab (<3 hours/day)    Assistance Recommended at Discharge Frequent or constant Supervision/Assistance  Patient can return home with the following  A lot of help with bathing/dressing/bathroom;Assistance with cooking/housework;Direct supervision/assist for medications management;Help with  stairs or ramp for entrance;Assist for transportation;Direct supervision/assist for financial management;Two people to help with walking and/or transfers;Assistance with feeding   Equipment Recommendations  Other (comment) (per next venue of care)    Recommendations for Other Services      Precautions / Restrictions Precautions Precautions: Fall Restrictions Weight Bearing Restrictions: No       Mobility Bed Mobility Overal bed mobility: Needs Assistance Bed Mobility: Supine to Sit, Sit to Supine     Supine to sit: Mod assist, HOB elevated Sit to supine: HOB elevated        Transfers Overall transfer level: Needs assistance Equipment used: Rolling walker (2 wheels) Transfers: Sit to/from Stand Sit to Stand: Min assist                 Balance Overall balance assessment: Needs assistance Sitting-balance support: Feet supported Sitting balance-Leahy Scale: Poor   Postural control: Posterior lean Standing balance support: Bilateral upper extremity supported Standing balance-Leahy Scale: Fair                             ADL either performed or assessed with clinical judgement   ADL Overall ADL's : Needs assistance/impaired     Grooming: Oral care;Standing;Minimal assistance Grooming Details (indicate cue type and reason): sink level             Lower Body Dressing: Minimal assistance;Sit to/from stand Lower Body Dressing Details (indicate cue type and reason): pants Toilet Transfer: Minimal assistance;Ambulation;Cueing for safety;Cueing for sequencing;Rolling walker (2 wheels)   Toileting- Clothing Manipulation and Hygiene: Moderate assistance;Sit to/from stand       Functional mobility during ADLs: Minimal assistance;Cueing for safety;Cueing for sequencing;Rolling walker (2  wheels)      Extremity/Trunk Assessment              Vision       Perception     Praxis      Cognition Arousal/Alertness: Awake/alert Behavior  During Therapy: WFL for tasks assessed/performed Overall Cognitive Status: Impaired/Different from baseline Area of Impairment: Safety/judgement, Following commands, Awareness                       Following Commands: Follows one step commands with increased time Safety/Judgement: Decreased awareness of safety Awareness: Intellectual Problem Solving: Decreased initiation, Difficulty sequencing, Requires verbal cues, Requires tactile cues          Exercises      Shoulder Instructions       General Comments pt with red/brown urine after amb to bathroom, RN present to assess and MD notified    Pertinent Vitals/ Pain       Pain Assessment Pain Assessment: No/denies pain  Home Living                                          Prior Functioning/Environment              Frequency  Min 3X/week        Progress Toward Goals  OT Goals(current goals can now be found in the care plan section)  Progress towards OT goals: Progressing toward goals     Plan Discharge plan needs to be updated;Frequency needs to be updated    Co-evaluation                 AM-PAC OT "6 Clicks" Daily Activity     Outcome Measure   Help from another person eating meals?: A Little Help from another person taking care of personal grooming?: A Little Help from another person toileting, which includes using toliet, bedpan, or urinal?: A Lot Help from another person bathing (including washing, rinsing, drying)?: A Lot Help from another person to put on and taking off regular upper body clothing?: A Little Help from another person to put on and taking off regular lower body clothing?: A Lot 6 Click Score: 15    End of Session Equipment Utilized During Treatment: Gait belt;Rolling walker (2 wheels)  OT Visit Diagnosis: Unsteadiness on feet (R26.81);Repeated falls (R29.6);Muscle weakness (generalized) (M62.81);Other symptoms and signs involving the nervous system  (R29.898)   Activity Tolerance Patient tolerated treatment well   Patient Left in bed;with call bell/phone within reach;with bed alarm set;with family/visitor present   Nurse Communication Mobility status        Time: 1352-1420 OT Time Calculation (min): 28 min  Charges: OT General Charges $OT Visit: 1 Visit OT Treatments $Self Care/Home Management : 23-37 mins  Shanon Payor, OTD OTR/L  05/01/22, 3:49 PM

## 2022-05-01 NOTE — Progress Notes (Signed)
No charge note. I was asked to clarify duration of dual antiplatelet therapy. In this case I recommend 90 days due to aortic arch ulcerated plaque.

## 2022-05-01 NOTE — Progress Notes (Signed)
Physical Therapy Treatment Patient Details Name: Brian [name] MRN: [id number] DOB: 05-10-1941 Today's Date: 05/01/2022   History of Present Illness Pt is an 81 yo male that presented to ED for L sided weakness and slurred speech. 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 PMH of COPD, HTN, HIV.    PT Comments    Pt was long sitting in bed with supportive daughter and granddaughter at bedside. Pt is agreeable to PT session. Does not endorse pain but does endorse fatigue. Pt is oriented and able to follow commands however per daughter, " Not at baseline." Pt is slightly impulsive and requires constant safety cues during session. He is far from baseline abilities. He does not use AD at baseline and currently requires RW + assist to safely ambulate and perform ADLs. Highly recommend DC to SNF to address deficits while assisting pt to PLOF.     Recommendations for follow up therapy are one component of a multi-disciplinary discharge planning process, led by the attending physician.  Recommendations may be updated based on patient status, additional functional criteria and insurance authorization.  Follow Up Recommendations  Skilled nursing-short term rehab (<3 hours/day)     Assistance Recommended at Discharge Frequent or constant Supervision/Assistance  Patient can return home with the following A little help with walking and/or transfers;A little help with bathing/dressing/bathroom;Assistance with cooking/housework;Assistance with feeding;Direct supervision/assist for medications management;Direct supervision/assist for financial management;Assist for transportation;Help with stairs or ramp for entrance   Equipment Recommendations  Rolling walker (2 wheels)       Precautions / Restrictions Precautions Precautions: Fall Restrictions Weight Bearing Restrictions: No     Mobility  Bed Mobility Overal bed  mobility: Needs Assistance Bed Mobility: Supine to Sit  Supine to sit: Supervision  General bed mobility comments: Pt was able to exit bed with increased time + supervision. he used bed functions to achieve sitting (bedrails and HOB elevated)    Transfers Overall transfer level: Needs assistance Equipment used: Rolling walker (2 wheels) Transfers: Sit to/from Stand Sit to Stand: Min guard   General transfer comment: CGA for safety to stand from elevated bed height. vcs for handplacement and improved technique. pt is slightly impulsive at times    Ambulation/Gait Ambulation/Gait assistance: Min guard Gait Distance (Feet): 100 Feet Assistive device: Rolling walker (2 wheels) Gait Pattern/deviations: Step-through pattern, Decreased stance time - left, Decreased step length - right, Decreased weight shift to left Gait velocity: decreased     General Gait Details: pt was able to ambulate 100 ft with RW + CGA + alot of vcs. pt is impuslive and need vcs throughout for additional safety during ambulation. Overall some motor planning concerns. Through ambulation, gait kinematics started to improve but pt is severely limited by fatigue. vitals stable throughout on rm air. Pt does not use AD at baseline and pt is far from baseline abiltiies     Balance Overall balance assessment: Needs assistance Sitting-balance support: Feet supported Sitting balance-Leahy Scale: Fair     Standing balance support: Bilateral upper extremity supported Standing balance-Leahy Scale: Fair Standing balance comment: pt does rely on UE support during dynamic standing activity due to unsteadiness. High fall risk due to slight impulsivity       Cognition Arousal/Alertness: Awake/alert Behavior During Therapy: WFL for tasks assessed/performed Overall Cognitive Status: Within Functional Limits for tasks assessed Area of Impairment: Safety/judgement      Safety/Judgement: Decreased awareness of  safety Awareness:  Intellectual Problem Solving: Decreased initiation, Difficulty sequencing, Requires verbal cues, Requires tactile cues General Comments: Pt was eating breakfast upon arriving. he agrees to PT session and is cooperative and motivated. Author questions if pt truely understands deficits. Poor insight of current deficits.           General Comments General comments (skin integrity, edema, etc.): discussed DC disposition and ecommendation for rehab. Pt and pt's family in agreement that STR is best/safest DC disposition.      Pertinent Vitals/Pain Pain Assessment Pain Assessment: No/denies pain     PT Goals (current goals can now be found in the care plan section) Acute Rehab PT Goals Patient Stated Goal: to go home and drive again Progress towards PT goals: Progressing toward goals    Frequency    7X/week      PT Plan Current plan remains appropriate    Co-evaluation     PT goals addressed during session: Mobility/safety with mobility;Balance;Proper use of DME;Strengthening/ROM        AM-PAC PT "6 Clicks" Mobility   Outcome Measure  Help needed turning from your back to your side while in a flat bed without using bedrails?: A Little Help needed moving from lying on your back to sitting on the side of a flat bed without using bedrails?: A Little Help needed moving to and from a bed to a chair (including a wheelchair)?: A Lot Help needed standing up from a chair using your arms (e.g., wheelchair or bedside chair)?: A Little Help needed to walk in hospital room?: A Little Help needed climbing 3-5 steps with a railing? : A Lot 6 Click Score: 16    End of Session   Activity Tolerance: Patient tolerated treatment well;Patient limited by fatigue Patient left: in chair;with call bell/phone within reach;with family/visitor present;with chair alarm set Nurse Communication: Mobility status PT Visit Diagnosis: Other abnormalities of gait and mobility  (R26.89);Difficulty in walking, not elsewhere classified (R26.2);Muscle weakness (generalized) (M62.81)     Time: 9678-9381 PT Time Calculation (min) (ACUTE ONLY): 25 min  Charges:  $Gait Training: 8-22 mins $Therapeutic Activity: 8-22 mins                     Jetta Lout PTA 05/01/22, 9:37 AM

## 2022-05-02 DIAGNOSIS — I6312 Cerebral infarction due to embolism of basilar artery: Secondary | ICD-10-CM | POA: Diagnosis not present

## 2022-05-02 LAB — URINALYSIS, COMPLETE (UACMP) WITH MICROSCOPIC
Bacteria, UA: NONE SEEN
Specific Gravity, Urine: 1.02 (ref 1.005–1.030)
Squamous Epithelial / HPF: NONE SEEN (ref 0–5)
WBC, UA: NONE SEEN WBC/hpf (ref 0–5)

## 2022-05-02 MED ORDER — CLOPIDOGREL BISULFATE 75 MG PO TABS: 75.0000 mg | ORAL_TABLET | Freq: Every day | ORAL | 2 refills | Status: AC

## 2022-05-02 MED ORDER — ASPIRIN 81 MG PO CHEW: 324.0000 mg | CHEWABLE_TABLET | Freq: Every day | ORAL | 2 refills | Status: AC

## 2022-05-02 MED ORDER — METOPROLOL SUCCINATE ER 25 MG PO TB24
25.0000 mg | ORAL_TABLET | Freq: Two times a day (BID) | ORAL | Status: DC
  Administered 2022-05-02 – 2022-05-03 (×4): 25 mg via ORAL
  Filled 2022-05-02 (×4): qty 1

## 2022-05-02 MED ORDER — SIMETHICONE 80 MG PO CHEW: 80.0000 mg | CHEWABLE_TABLET | Freq: Four times a day (QID) | ORAL | 0 refills | Status: AC | PRN

## 2022-05-02 MED ORDER — PANTOPRAZOLE SODIUM 40 MG PO TBEC: 40.0000 mg | DELAYED_RELEASE_TABLET | Freq: Every day | ORAL | Status: DC

## 2022-05-02 NOTE — TOC Progression Note (Signed)
Transition of Care [facility]) - Progression Note    Patient Details  Name: Brian [name] MRN: [id number] Date of Birth: 02/16/1941  Transition of Care [facility]) CM/SW Contact  Truddie Hidden, RN Phone Number: 05/02/2022, 2:05 PM  Clinical Narrative:    Per MD, d/c cancelled due to hematuria. Patient's daughter notified.    Expected Discharge Plan: Skilled Nursing Facility Barriers to Discharge: Continued Medical Work up  Expected Discharge Plan and Services Expected Discharge Plan: Skilled Nursing Facility In-house Referral: Clinical Social Work   Post Acute Care Choice: Skilled Nursing Facility Living arrangements for the past 2 months: Single Family Home Expected Discharge Date: 05/02/22                                     Social Determinants of Health (SDOH) Interventions    Readmission Risk Interventions     No data to display

## 2022-05-02 NOTE — TOC Transition Note (Signed)
Transition of Care [facility]) - CM/SW Discharge Note   Patient Details  Name: Brian [name] MRN: [id number] Date of Birth: 1941/04/21  Transition of Care [facility]) CM/SW Contact:  Truddie Hidden, RN Phone Number: 05/02/2022, 2:00 PM   Clinical Narrative:    Facility approved admission for today. Spoke with Tammy for room assignment. Nurse, MD, and family notified. Discharge summary and Transfer summary sent in HUB. EMS arranged by nurse. TOC signing off.      Barriers to Discharge: Continued Medical Work up   Patient Goals and CMS Choice Patient states their goals for this hospitalization and ongoing recovery are:: to get better   Choice offered to / list presented to : Adult Children  Discharge Placement                       Discharge Plan and Services In-house Referral: Clinical Social Work   Post Acute Care Choice: Skilled Nursing Facility                               Social Determinants of Health (SDOH) Interventions     Readmission Risk Interventions     No data to display

## 2022-05-02 NOTE — Progress Notes (Addendum)
PT Cancellation Note  Patient Details Name: Brian [name] MRN: [id number] DOB: November 01, 1940   Cancelled Treatment:    230 pm: PT attempt. Pt is eating lunch currently. Dc held due to hematuria. Acute PT will continue to follow and progress as able per current POC.    Rushie Chestnut 05/02/2022, 3:55 PM

## 2022-05-02 NOTE — Progress Notes (Signed)
PROGRESS NOTE    Brian [name]  GBT:[id number] DOB: 1941/05/22 DOA: 04/25/2022 PCP: Armando Gang, FNP    Brief Narrative:  81 year old with history of bradycardia status post pacemaker, hypertension, hyperlipidemia, COPD, OSA not using CPAP at night, Spanish-speaking presented to the emergency room with left-sided weakness and difficulty speaking.  Admitted as code stroke.  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  Seen and followed by neurology and vascular surgery.  Recommended dual antiplatelet therapy.  He is working with rehab, recommended SNF placement.  Waiting for bed availability.   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 CT head findings, no acute abnormalities.  Small vessel disease. MRI of the brain, unable to do.  Pacemaker not compatible to MRI. CT angiogram head and neck, no emergent large vessel occlusion.  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 2D echocardiogram, ejection fraction 55 to 60%.  Grade 2 diastolic dysfunction.  No intracardiac thrombus. Antiplatelet therapy, none at home.  Neurology recommended aspirin 324 mg daily, Plavix 75 mg daily after loading dose for 3 months.  Will send neurology referral on discharge. LDL 86.  On atorvastatin that is continued.  Hemoglobin A1c 5.8.  No indication for treatment. Speech, regular diet. PT OT, recommended SNF.  Waiting for bed. Vascular surgery and neurology, recommended conservative management. Recommended event monitoring for thromboembolism, previous attending has discussed with Dr. Harl Bowie who is going to interrogate pacemaker for events after discharge.  Right eye conjunctivitis: Evaluated  by ophthalmology.  On antibiotics ointment till 7/7.  Improved.  Essential hypertension: Started on amlodipine.  Resume home dose of metoprolol.  HIV infection: Stable.  On Haart therapy.  COPD: Stable.  Chronic pain anxiety: Patient occasionally uses tramadol and Klonopin for pain relief and anxiety.  Verified with outpatient records.  Will prescribe short-term therapies.  Medically stable to transfer to SNF.  DVT prophylaxis: enoxaparin (LOVENOX) injection 40 mg Start: 04/25/22 2200   Code Status: DNR Family Communication: Granddaughter at the bedside. Disposition Plan: Status is: Inpatient Remains inpatient appropriate because: Waiting for SNF     Consultants:  Neurology Vascular  Procedures:  None  Antimicrobials:  Ophthalmic ointment   Subjective:  Seen and examined.  Denies any complaints.  No overnight events.  Granddaughter at the bedside stated that he was sleepy in the afternoon yesterday that prompted [place] to see if anything it happened.  According to the granddaughter he is back to his baseline mental status.  Objective: Vitals:   05/01/22 1610 05/01/22 2007 05/02/22 0455 05/02/22 0807  BP: (!) 157/74 (!) 149/85 (!) 166/89 (!) 158/93  Pulse: 64 77 61 63  Resp: 18 17 18 15   Temp: 98 F (36.7 C) 98.1 F (36.7 C) 98.4 F (36.9 C) 97.8 F (36.6 C)  TempSrc: Oral Oral  Oral  SpO2: 95% 98% 99% 100%  Weight:      Height:        Intake/Output Summary (Last 24 hours) at 05/02/2022 1026 Last data filed at 05/01/2022 1800 Gross per 24 hour  Intake 240 ml  Output 150 ml  Net 90 ml   Filed Weights   04/25/22 0744  Weight: 76 kg    Examination:  General exam: Appears calm and comfortable  Alert awake.  His speech is clear.  Answers appropriately. Respiratory system: Clear to auscultation. Respiratory effort normal. Cardiovascular system: S1 & S2 heard, RRR.  Pacemaker in place.   Gastrointestinal system: Soft.  Nontender.   Central nervous system:  Alert and oriented.  Mild left facial droop. Left upper and lower extremity 4/5.  Weaker than right side.    Data Reviewed: I have personally reviewed following labs and imaging studies  CBC: Recent Labs  Lab 04/30/22 0504  WBC 9.4  HGB 15.7  HCT 45.6  MCV 87.9  PLT 225   Basic Metabolic Panel: No results for input(s): "NA", "K", "CL", "CO2", "GLUCOSE", "BUN", "CREATININE", "CALCIUM", "MG", "PHOS" in the last 168 hours.  GFR: Estimated Creatinine Clearance: 56.8 mL/min (by C-G formula based on SCr of 0.92 mg/dL). Liver Function Tests: No results for input(s): "AST", "ALT", "ALKPHOS", "BILITOT", "PROT", "ALBUMIN" in the last 168 hours.  No results for input(s): "LIPASE", "AMYLASE" in the last 168 hours. No results for input(s): "AMMONIA" in the last 168 hours. Coagulation Profile: No results for input(s): "INR", "PROTIME" in the last 168 hours.  Cardiac Enzymes: No results for input(s): "CKTOTAL", "CKMB", "CKMBINDEX", "TROPONINI" in the last 168 hours. BNP (last 3 results) No results for input(s): "PROBNP" in the last 8760 hours. HbA1C: No results for input(s): "HGBA1C" in the last 72 hours. CBG: No results for input(s): "GLUCAP" in the last 168 hours.  Lipid Profile: No results for input(s): "CHOL", "HDL", "LDLCALC", "TRIG", "CHOLHDL", "LDLDIRECT" in the last 72 hours. Thyroid Function Tests: No results for input(s): "TSH", "T4TOTAL", "FREET4", "T3FREE", "THYROIDAB" in the last 72 hours. Anemia Panel: No results for input(s): "VITAMINB12", "FOLATE", "FERRITIN", "TIBC", "IRON", "RETICCTPCT" in the last 72 hours. Sepsis Labs: No results for input(s): "PROCALCITON", "LATICACIDVEN" in the last 168 hours.  No results found for this or any previous visit (from the past 240 hour(s)).       Radiology Studies: No results found.      Scheduled Meds:  abacavir-dolutegravir-lamiVUDine  1 tablet Oral Daily   amLODipine  10 mg Oral Daily   aspirin  324 mg Oral Daily    atorvastatin  40 mg Oral QHS   clopidogrel  75 mg Oral Daily   DULoxetine  30 mg Oral Daily   dutasteride  0.5 mg Oral Daily   enoxaparin (LOVENOX) injection  40 mg Subcutaneous Q24H   gatifloxacin  1 drop Right Eye QID   metoprolol succinate  25 mg Oral BID   pantoprazole  40 mg Oral Daily   pregabalin  50 mg Oral QHS   tamsulosin  0.4 mg Oral Daily   Continuous Infusions:   LOS: 7 days    Time spent: 35 minutes    Dorcas Carrow, MD [facility] Pager [phone]

## 2022-05-02 NOTE — Care Management (Signed)
Patient was getting ready to be transferred to skilled nursing facility.  He started having frank hematuria.  Denies any other complaints. Urinalysis with frank hematuria, no bacteria.  Culture will be sent. Patient does have a history of hematuria and recurrent UTIs, cystoscopy about a year ago.  Probably mucosal lesions bleeding with use of dual antiplatelet therapy with aspirin and Plavix.  Plan: Monitor for frequency and volume of bleeding. Continue aspirin Plavix today.  Discontinue Lovenox. Recheck hemoglobin tomorrow morning. Urine culture. If any retention of urine, will use three-way catheter and start CBI. If urine is spontaneously clears, will able to discharge to SNF tomorrow.

## 2022-05-02 NOTE — Evaluation (Signed)
Clinical/Bedside Swallow Evaluation Patient Details  Name: Brian [name] MRN: [id number] Date of Birth: 1941-06-10  Today's Date: 05/02/2022 Time: SLP Start Time (ACUTE ONLY): 1618 SLP Stop Time (ACUTE ONLY): 1645 SLP Time Calculation (min) (ACUTE ONLY): 27 min  Past Medical History:  Past Medical History:  Diagnosis Date   Arthritis    Asthma    Bradycardia    Depression    HIV (human immunodeficiency virus infection) (HCC)    Hyperlipidemia    Past Surgical History:  Past Surgical History:  Procedure Laterality Date   cataracts     EYE SURGERY     PACEMAKER INSERTION N/A 08/02/2015   Procedure: INSERTION PACEMAKER;  Surgeon: Corky Downs, MD;  Location: [name];  Service: Cardiovascular;  Laterality: N/A;   HPI:  Pt is an 81 yo male that presented to ED for L sided weakness and slurred speech. Neurology consult pending, initial note indicates possible posterior circulation versus right hemispheric stroke. PMH includes COPD, HTN, HIV. CTA head and neck: "1. No emergent large vessel occlusion.2. Moderate left P2 PCA stenosis.3. 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 Partially imaged ground-glass opacities in the visualized lung apices, which could represent expiratory changes/atelectasis and/or pneumonia. CT of the chest could further evaluate if clinically warranted." No MRI d/t pacemaker.    Assessment / Plan / Recommendation  Clinical Impression   SLP swallow evaluation orders placed due to RN reports pt coughing with thin liquid via straw. Daughter-in-law also reports some coughing with meals, but states this reduces with pt slowing rate. Patient presents with signs of oropharyngeal dysphagia. Suspect reduced airway protection with thin liquids. Pt has consistent signs of aspiration including throat clearing, immediate and delayed coughing with both straw and cup sips of thin, even when taking smaller, single sips. Dysarthria and vocal quality  somewhat improved from previous session targeting dysathria, however voice remains hoarse and left facial weakness persists. CN VII weakness has mild impact on mastication, oral clearance of solid. Pt has mild left lingual residual which he clears with cued lingual sweep (lingual strength mildly reduced bilaterally). With nectar-thick liquids, pt appears to have improved timing and oral control, and there are no overt signs of aspiration, even when challenging as liquid wash of solid and via straw. Given consistent signs of aspiration with thin liquids, recommend downgrade to nectar thick liquids pending MBS, to be completed next date. Discussed with pt and family who are in agreement. Nsg updated and precautions posted at head of bed. Recommend regular solids (meats moistened) and nectar thick liquids, meds whole with puree or nectar. Position fully upright for POs and encourage pt to use slow rate, small bites and sips. SLP will continue to follow and update following MBS tomorrow.       SLP Visit Diagnosis: Dysphagia, oropharyngeal phase (R13.12)    Aspiration Risk  Mild aspiration risk    Diet Recommendation Regular;Nectar-thick liquid (Moisten meats with extra sauce, gravy)   Liquid Administration via: Cup;Straw (nectar OK via straw) Medication Administration:  (with puree or nectar) Supervision: Patient able to self feed;Intermittent supervision to cue for compensatory strategies Compensations: Slow rate;Minimize environmental distractions;Small sips/bites;Lingual sweep for clearance of pocketing Postural Changes: Seated upright at 90 degrees    Other  Recommendations Oral Care Recommendations: Oral care BID    Recommendations for follow up therapy are one component of a multi-disciplinary discharge planning process, led by the attending physician.  Recommendations may be updated based on patient status, additional functional  criteria and insurance authorization.  Follow up  Recommendations Skilled nursing-short term rehab (<3 hours/day)      Assistance Recommended at Discharge Frequent or constant Supervision/Assistance  Functional Status Assessment Patient has had a recent decline in their functional status and demonstrates the ability to make significant improvements in function in a reasonable and predictable amount of time.  Frequency and Duration min 2x/week  2 weeks       Prognosis Prognosis for Safe Diet Advancement: Good      Swallow Study   General Date of Onset: 04/25/22 HPI: Pt is an 81 yo male that presented to ED for L sided weakness and slurred speech. Neurology consult pending, initial note indicates possible posterior circulation versus right hemispheric stroke. PMH includes COPD, HTN, HIV. CTA head and neck: "1. No emergent large vessel occlusion.2. Moderate left P2 PCA stenosis.3. 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 Partially imaged ground-glass opacities in the visualized lung apices, which could represent expiratory changes/atelectasis and/or pneumonia. CT of the chest could further evaluate if clinically warranted." No MRI d/t pacemaker. Type of Study: Bedside Swallow Evaluation Previous Swallow Assessment: none on file; passed [employer] screen Diet Prior to this Study: Regular;Thin liquids Temperature Spikes Noted: No Respiratory Status: Room air History of Recent Intubation: No Behavior/Cognition: Alert;Cooperative;Pleasant mood Oral Cavity Assessment: Other (comment) (mild lingual erythema) Oral Cavity - Dentition: Dentures, top;Dentures, bottom Vision: Functional for self-feeding Self-Feeding Abilities: Able to feed self Patient Positioning: Upright in bed Baseline Vocal Quality: Hoarse;Low vocal intensity Volitional Cough: Strong Volitional Swallow: Able to elicit    Oral/Motor/Sensory Function Overall Oral Motor/Sensory Function: Mild impairment Facial ROM: Reduced  left;Suspected CN VII (facial) dysfunction Facial Symmetry: Abnormal symmetry left;Suspected CN VII (facial) dysfunction Facial Strength: Reduced left;Suspected CN VII (facial) dysfunction Lingual Strength: Reduced (generalized) Lingual Sensation: Within Functional Limits Velum: Within Functional Limits Mandible: Within Functional Limits   Ice Chips Ice chips: Within functional limits Presentation: Spoon   Thin Liquid Thin Liquid: Impaired Presentation: Cup;Self Fed Pharyngeal  Phase Impairments: Throat Clearing - Immediate;Cough - Delayed;Cough - Immediate    Nectar Thick Nectar Thick Liquid: Within functional limits Presentation: Cup;Straw   Honey Thick Honey Thick Liquid: Not tested   Puree Puree: Within functional limits Presentation: Self Fed;Spoon   Solid     Solid: Impaired Presentation: Self Fed Oral Phase Impairments: Impaired mastication;Reduced labial seal Oral Phase Functional Implications: Oral residue (left lingual residual, mild) Other Comments: delayed cough with liquid wash of thin. no overt s/sx with liquid wash of nectar     Rondel Baton, MS, [employer] Pathologist [phone]  Arlana Lindau 05/02/2022,5:15 PM

## 2022-05-02 NOTE — Progress Notes (Signed)
Physical Therapy Treatment Patient Details Name: Brian [name] MRN: [id number] DOB: 1941/03/04 Today's Date: 05/02/2022   History of Present Illness Pt is an 81 yo male that presented to ED for L sided weakness and slurred speech. 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 PMH of COPD, HTN, HIV.    PT Comments    Pt was long sitting in bed upon arriving. He is awake with supportive family members in room. He still lacks insight of deficits and ask when he will go home later. Pt's cognition and poor overall insight of deficits make him a high fall risk. He was able to exit bed, stand to RW, and ambulate with improved foot clearance however need constant cues to slow down and improve safety. Pt's impulsivity and poor insight of deficits make him a high fall risk. Pt will greatly benefit from STR to address balance deficits while maximizing independence with ADLs.    Recommendations for follow up therapy are one component of a multi-disciplinary discharge planning process, led by the attending physician.  Recommendations may be updated based on patient status, additional functional criteria and insurance authorization.  Follow Up Recommendations  Skilled nursing-short term rehab (<3 hours/day)     Assistance Recommended at Discharge Frequent or constant Supervision/Assistance  Patient can return home with the following A little help with walking and/or transfers;A little help with bathing/dressing/bathroom;Assistance with cooking/housework;Assistance with feeding;Direct supervision/assist for medications management;Direct supervision/assist for financial management;Assist for transportation;Help with stairs or ramp for entrance   Equipment Recommendations  Rolling walker (2 wheels)       Precautions / Restrictions Precautions Precautions: Fall Restrictions Weight Bearing Restrictions: No     Mobility   Bed Mobility Overal bed mobility: Needs Assistance Bed Mobility: Supine to Sit, Sit to Supine  Supine to sit: Min assist Sit to supine: Min assist   Transfers Overall transfer level: Needs assistance Equipment used: Rolling walker (2 wheels) Transfers: Sit to/from Stand Sit to Stand: Min guard      General transfer comment: CGA for safety with Vcs for imporved technique, sequencing, and safety. Pt remains impulsive    Ambulation/Gait Ambulation/Gait assistance: Min guard Gait Distance (Feet): 120 Feet Assistive device: Rolling walker (2 wheels) Gait Pattern/deviations: Step-through pattern, Trunk flexed Gait velocity: decreased     General Gait Details: pt was able to ambulate ~ 120 ft with RW however continues to have poor safety. Pt is high fall risk due to impulsivity and lacking insight of deficits. Overall much improved abilities to clear BLEs to advance with gait. Pt does not use AD at baseline and will greatly benefit from SNF to maximize safety and independnece during gait.    Balance Overall balance assessment: Needs assistance Sitting-balance support: Feet supported Sitting balance-Leahy Scale: Fair Sitting balance - Comments: no LOB while seated EOB with all extremity support   Standing balance support: Bilateral upper extremity supported Standing balance-Leahy Scale: Fair Standing balance comment: pt is a high fall risk and lacks insight of deficits. He is currently requiring AD and does not usually use one at baseline       Cognition Arousal/Alertness: Awake/alert Behavior During Therapy: WFL for tasks assessed/performed Overall Cognitive Status: Impaired/Different from baseline Area of Impairment: Safety/judgement, Following commands, Awareness      Following Commands: Follows one step commands with increased time Safety/Judgement: Decreased awareness of safety   Problem Solving: Decreased initiation, Difficulty sequencing, Requires verbal cues, Requires  tactile cues  General Comments: pt is A and O x 2,he continues to lack insight of deficits with poor overall awareness. Was agreeable to OOB activity               Pertinent Vitals/Pain Pain Assessment Pain Assessment: No/denies pain     PT Goals (current goals can now be found in the care plan section) Acute Rehab PT Goals Patient Stated Goal: to go home and drive again Progress towards PT goals: Progressing toward goals    Frequency    7X/week      PT Plan Current plan remains appropriate    Co-evaluation     PT goals addressed during session: Mobility/safety with mobility;Balance;Proper use of DME;Strengthening/ROM        AM-PAC PT "6 Clicks" Mobility   Outcome Measure  Help needed turning from your back to your side while in a flat bed without using bedrails?: A Little Help needed moving from lying on your back to sitting on the side of a flat bed without using bedrails?: A Little Help needed moving to and from a bed to a chair (including a wheelchair)?: A Little Help needed standing up from a chair using your arms (e.g., wheelchair or bedside chair)?: A Little Help needed to walk in hospital room?: A Little Help needed climbing 3-5 steps with a railing? : A Lot 6 Click Score: 17    End of Session   Activity Tolerance: Patient tolerated treatment well;Patient limited by fatigue Patient left: in bed;with call bell/phone within reach;with bed alarm set;with family/visitor present Nurse Communication: Mobility status PT Visit Diagnosis: Other abnormalities of gait and mobility (R26.89);Difficulty in walking, not elsewhere classified (R26.2);Muscle weakness (generalized) (M62.81)     Time: 5732-2025 PT Time Calculation (min) (ACUTE ONLY): 11 min  Charges:  $Gait Training: 8-22 mins                    Jetta Lout PTA 05/02/22, 4:55 PM

## 2022-05-02 NOTE — Discharge Summary (Signed)
Physician Discharge Summary  Brian [name] ZOX:[id number] DOB: 1940-11-10 DOA: 04/25/2022  PCP: Brian Gang, FNP  Admit date: 04/25/2022 Discharge date: 05/02/2022 discharge  Admitted From: Home Disposition: Skilled nursing facility  Recommendations for Outpatient Follow-up:  Follow up with PCP in 1-2 weeks We will send referral to neurology for subsequent follow-up.  Home Health: N/A Equipment/Devices: N/A  Discharge Condition: Stable CODE STATUS: Full code Diet recommendation: Low-salt diet  Discharge summary: 81 year old with history of bradycardia status post pacemaker, hypertension, hyperlipidemia, COPD, OSA not using CPAP at night, Spanish-speaking presented to the emergency room with left-sided weakness and difficulty speaking.  Admitted as code stroke.  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  Seen and followed by neurology and vascular surgery.  Recommended dual antiplatelet therapy.  He is working with rehab, recommended SNF placement.       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 CT head findings, no acute abnormalities.  Small vessel disease. MRI of the brain, unable to do.  Pacemaker not compatible to MRI. CT angiogram head and neck, no emergent large vessel occlusion.  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 2D echocardiogram, ejection fraction 55 to 60%.  Grade 2 diastolic dysfunction.  No intracardiac thrombus. Antiplatelet therapy, none at home.  Neurology recommended aspirin 324 mg daily, Plavix 75 mg daily after loading dose for 3 months.  Will send neurology referral on discharge. LDL 86.  On atorvastatin that is continued.   Hemoglobin A1c 5.8.  No indication for treatment. Speech, regular diet. PT OT, recommended SNF.  Waiting for bed. Vascular surgery and neurology, recommended conservative management. Recommended event monitoring for thromboembolism, previous attending has discussed with Dr. Harl Bowie who is going to interrogate pacemaker for events after discharge.   Right eye conjunctivitis: Evaluated by ophthalmology.  Treated with antibiotic ointment.  Improved.   Essential hypertension: Started on amlodipine.  Resume home dose of metoprolol.   HIV infection: Stable.  On Haart therapy.   COPD: Stable.   Chronic pain anxiety: Patient occasionally uses tramadol and Klonopin for pain relief and anxiety.  Verified with outpatient records.  Will prescribe short-term therapies.  Stable to transfer to SNF.    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   Depression with anxiety   Conjunctivitis    Discharge Instructions  Discharge Instructions     Ambulatory referral to Neurology   Complete by: As directed    An appointment is requested in approximately: 4 weeks   Diet - low sodium heart healthy   Complete by: As directed    Increase activity slowly   Complete by: As directed       Allergies as of 05/02/2022   No Known Allergies      Medication List     STOP taking these medications    nystatin-triamcinolone ointment Commonly known as: MYCOLOG   omeprazole 20 MG capsule Commonly known as: PRILOSEC Replaced by: pantoprazole 40 MG tablet  TAKE these medications    alendronate 70 MG tablet Commonly known as: FOSAMAX Take 70 mg by mouth once a week. Take on Saturday.   amLODipine 10 MG  tablet Commonly known as: NORVASC Take 1 tablet (10 mg total) by mouth daily.   aspirin 81 MG chewable tablet Chew 4 tablets (324 mg total) by mouth daily. Start taking on: May 03, 2022   atorvastatin 40 MG tablet Commonly known as: LIPITOR Take 40 mg by mouth daily.   azelastine 0.05 % ophthalmic solution Commonly known as: OPTIVAR Apply 1 drop to eye 2 (two) times daily.   clonazePAM 0.5 MG tablet Commonly known as: KLONOPIN Take 1 tablet (0.5 mg total) by mouth 2 (two) times daily as needed for up to 5 days for anxiety.   clopidogrel 75 MG tablet Commonly known as: PLAVIX Take 1 tablet (75 mg total) by mouth daily. Start taking on: May 03, 2022   DULoxetine 30 MG capsule Commonly known as: CYMBALTA Take 30 mg by mouth daily.   dutasteride 0.5 MG capsule Commonly known as: AVODART Take 1 capsule (0.5 mg total) by mouth daily.   loratadine 10 MG tablet Commonly known as: CLARITIN Take 10 mg by mouth daily.   metoprolol succinate 25 MG 24 hr tablet Commonly known as: TOPROL-XL TAKE 1 TABLET (25 MG TOTAL) BY MOUTH IN THE MORNING AND AT BEDTIME.   pantoprazole 40 MG tablet Commonly known as: PROTONIX Take 1 tablet (40 mg total) by mouth daily. Start taking on: May 03, 2022 Replaces: omeprazole 20 MG capsule   pregabalin 50 MG capsule Commonly known as: LYRICA Take 50 mg by mouth in the morning and at bedtime.   simethicone 80 MG chewable tablet Commonly known as: MYLICON Chew 1 tablet (80 mg total) by mouth every 6 (six) hours as needed for flatulence.   tamsulosin 0.4 MG Caps capsule Commonly known as: FLOMAX Take 1 capsule (0.4 mg total) by mouth daily.   traMADol-acetaminophen 37.5-325 MG tablet Commonly known as: ULTRACET Take 1 tablet by mouth daily as needed for up to 5 days.   triamcinolone cream 0.1 % Commonly known as: KENALOG Apply 1 application topically 2 (two) times daily.   Triumeq 600-50-300 MG tablet Generic drug:  abacavir-dolutegravir-lamiVUDine Tome 1 tableta por va oral diariamente. (Take 1 tablet by mouth daily.)   Ventolin HFA 108 (90 Base) MCG/ACT inhaler Generic drug: albuterol Inhale 2 puffs into the lungs 4 (four) times daily as needed. For shortness of breath and/or wheezing.        Contact information for follow-up providers     Brian Gang, FNP Follow up in 1 week(s).   Specialty: Family Medicine Contact information: [address] [place] [address] [phone]         Lanier Prude, MD .   Specialties: Cardiology, Radiology Contact information: [address] [place] [address] [phone]         Corky Downs, MD Follow up in 1 week(s).   Specialties: Internal Medicine, Cardiology Why: pacemaker interrigation Contact information: [address] [place] [address] [phone]              Contact information for after-discharge care     Destination     [facility] Preferred SNF .   Service: Skilled Paramedic information: [address] [place] [address] [phone]  No Known Allergies  Consultations: Neurology, Vascular   Procedures/Studies: ECHOCARDIOGRAM COMPLETE  Result Date: 04/25/2022    ECHOCARDIOGRAM REPORT   Patient Name:   Brian [name] Date of Exam: 04/25/2022 Medical Rec #:  [id number]    Height:       66.0 in Accession #:    [id number]   Weight:       167.5 lb Date of Birth:  [birth year]     BSA:          1.855 m Patient Age:    81 years     BP:           151/91 mmHg Patient Gender: M            HR:           50 bpm. Exam Location:  [facility] Procedure: 2D Echo, Cardiac Doppler and Color Doppler Indications:     Stroke I63.9  History:         Patient has prior history of Echocardiogram examinations, most                  recent 07/14/2021. Risk Factors:Dyslipidemia. Bradycardia.  Sonographer:     Cristela Blue Referring Phys:  [id number] Brien Few NIU  Diagnosing Phys: Debbe Odea MD  Sonographer Comments: Suboptimal apical window. IMPRESSIONS  1. Left ventricular ejection fraction, by estimation, is 55 to 60%. The left ventricle has normal function. The left ventricle has no regional wall motion abnormalities. There is mild left ventricular hypertrophy. Left ventricular diastolic parameters are consistent with Grade II diastolic dysfunction (pseudonormalization).  2. Right ventricular systolic function is normal. The right ventricular size is normal.  3. Left atrial size was mildly dilated.  4. The mitral valve is normal in structure. No evidence of mitral valve regurgitation.  5. The aortic valve is grossly normal. Aortic valve regurgitation is not visualized.  6. Aortic dilatation noted. There is mild dilatation of the aortic root, measuring 39 mm.  7. The inferior vena cava is normal in size with <50% respiratory variability, suggesting right atrial pressure of 8 mmHg. FINDINGS  Left Ventricle: Left ventricular ejection fraction, by estimation, is 55 to 60%. The left ventricle has normal function. The left ventricle has no regional wall motion abnormalities. The left ventricular internal cavity size was normal in size. There is  mild left ventricular hypertrophy. Left ventricular diastolic parameters are consistent with Grade II diastolic dysfunction (pseudonormalization). Right Ventricle: The right ventricular size is normal. No increase in right ventricular wall thickness. Right ventricular systolic function is normal. Left Atrium: Left atrial size was mildly dilated. Right Atrium: Right atrial size was normal in size. Pericardium: There is no evidence of pericardial effusion. Mitral Valve: The mitral valve is normal in structure. No evidence of mitral valve regurgitation. Tricuspid Valve: The tricuspid valve is normal in structure. Tricuspid valve regurgitation is not demonstrated. Aortic Valve: The aortic valve is grossly normal. Aortic valve  regurgitation is not visualized. Aortic valve mean gradient measures 2.5 mmHg. Aortic valve peak gradient measures 4.6 mmHg. Aortic valve area, by VTI measures 2.36 cm. Pulmonic Valve: The pulmonic valve was normal in structure. Pulmonic valve regurgitation is not visualized. Aorta: Aortic dilatation noted. There is mild dilatation of the aortic root, measuring 39 mm. Venous: The inferior vena cava is normal in size with less than 50% respiratory variability, suggesting right atrial pressure of 8 mmHg. IAS/Shunts: No atrial level shunt detected by color flow Doppler.  LEFT VENTRICLE PLAX 2D LVIDd:  4.40 cm   Diastology LVIDs:         3.00 cm   LV e' medial:    4.35 cm/s LV PW:         1.40 cm   LV E/e' medial:  22.6 LV IVS:        1.20 cm   LV e' lateral:   4.35 cm/s LVOT diam:     2.10 cm   LV E/e' lateral: 22.6 LV SV:         56 LV SV Index:   30 LVOT Area:     3.46 cm  RIGHT VENTRICLE RV Basal diam:  3.30 cm RV S prime:     11.90 cm/s TAPSE (M-mode): 2.3 cm LEFT ATRIUM             Index        RIGHT ATRIUM           Index LA diam:        3.60 cm 1.94 cm/m   RA Area:     16.00 cm LA Vol (A2C):   60.9 ml 32.83 ml/m  RA Volume:   37.90 ml  20.43 ml/m LA Vol (A4C):   68.0 ml 36.66 ml/m LA Biplane Vol: 67.2 ml 36.23 ml/m  AORTIC VALVE AV Area (Vmax):    2.15 cm AV Area (Vmean):   2.10 cm AV Area (VTI):     2.36 cm AV Vmax:           107.15 cm/s AV Vmean:          74.450 cm/s AV VTI:            0.238 m AV Peak Grad:      4.6 mmHg AV Mean Grad:      2.5 mmHg LVOT Vmax:         66.60 cm/s LVOT Vmean:        45.100 cm/s LVOT VTI:          0.162 m LVOT/AV VTI ratio: 0.68  AORTA Ao Root diam: 3.83 cm MITRAL VALVE               TRICUSPID VALVE MV Area (PHT): 2.45 cm    TR Peak grad:   9.1 mmHg MV Decel Time: 310 msec    TR Vmax:        151.00 cm/s MV E velocity: 98.10 cm/s MV A velocity: 78.80 cm/s  SHUNTS MV E/A ratio:  1.24        Systemic VTI:  0.16 m                            Systemic Diam: 2.10 cm  Debbe Odea MD Electronically signed by Debbe Odea MD Signature Date/Time: 04/25/2022/2:41:32 PM    Final    CT ANGIO HEAD NECK W WO CM W PERF (CODE STROKE)  Result Date: 04/25/2022 CLINICAL DATA:  Neuro deficit, acute, stroke suspected left sided weakness EXAM: CT ANGIOGRAPHY HEAD AND NECK TECHNIQUE: Multidetector CT imaging of the head and neck was performed using the standard protocol during bolus administration of intravenous contrast. Multiplanar CT image reconstructions and MIPs were obtained to evaluate the vascular anatomy. Carotid stenosis measurements (when applicable) are obtained utilizing NASCET criteria, using the distal internal carotid diameter as the denominator. RADIATION DOSE REDUCTION: This exam was performed according to the departmental dose-optimization program which includes automated exposure control, adjustment of the mA and/or kV according to patient  size and/or use of iterative reconstruction technique. CONTRAST:  OMNIPAQUE IOHEXOL 350 MG/ML SOLN COMPARISON:  Same day head CT. FINDINGS: CTA NECK FINDINGS Aortic arch: Ulcerated atherosclerosis of the aorta with approximately 3 x 8 mm penetrating ulcer along the aortic arch. Great vessel origins are patent without significant stenosis. Right carotid system: Atherosclerosis at the carotid bifurcation without greater than 50% stenosis. Left carotid system: Atherosclerosis at the carotid bifurcation without greater than 50% stenosis. Vertebral arteries: Left dominant. Both vertebral arteries are patent without significant (greater than 50%) stenosis. Skeleton: Severe multilevel degenerative change in the visualized spine. Other neck: No acute abnormality. Upper chest: Partially imaged ground-glass opacities in the visualized lung apices. Review of the MIP images confirms the above findings CTA HEAD FINDINGS Anterior circulation: Bilateral intracranial ICAs, MCAs, and ACAs are patent without proximal hemodynamically  significant stenosis. Diminutive right A1 ACA and right carotid terminus, probably congenital/anatomic variant given prominent left A1 ACA and carotid terminus. Mild ICA atherosclerosis. Posterior circulation: Non dominant/small right vertebral artery appears to largely terminate as PICA, anatomic variant. Left intradural vertebral artery is prominent and patent. Basilar artery and bilateral posterior cerebral arteries are patent. Moderate left P2 PCA stenosis. Venous sinuses: As permitted by contrast timing, patent. Anatomic variants: Detailed above. Review of the MIP images confirms the above findings IMPRESSION: 1. No emergent large vessel occlusion. 2. Moderate left P2 PCA stenosis. 3. 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 4. Partially imaged ground-glass opacities in the visualized lung apices, which could represent expiratory changes/atelectasis and/or pneumonia. CT of the chest could further evaluate if clinically warranted. Electronically Signed   By: Feliberto Harts M.D.   On: 04/25/2022 08:51   CT HEAD CODE STROKE WO CONTRAST  Addendum Date: 04/25/2022   ADDENDUM REPORT: 04/25/2022 07:54 ADDENDUM: Study discussed by telephone with Dr. Aneta Mins STAFFORD on 04/25/2022 at 0744 hours. Electronically Signed   By: Odessa Fleming M.D.   On: 04/25/2022 07:54   Result Date: 04/25/2022 CLINICAL DATA:  Code stroke.  81 year old male EXAM: CT HEAD WITHOUT CONTRAST TECHNIQUE: Contiguous axial images were obtained from the base of the skull through the vertex without intravenous contrast. RADIATION DOSE REDUCTION: This exam was performed according to the departmental dose-optimization program which includes automated exposure control, adjustment of the mA and/or kV according to patient size and/or use of iterative reconstruction technique. COMPARISON:  Head CT 01/12/2020 FINDINGS: Brain: Mild motion artifact. No midline shift, mass effect, or evidence of intracranial mass lesion.  No ventriculomegaly. No acute intracranial hemorrhage identified. Gray-white matter differentiation appears largely stable since 2021 and is largely normal for age. However, there are small chronic appearing lacunar infarcts of the left caudate (series 3, image 16) and left corona radiata (new, image 18). No cortically based acute infarct identified. Vascular: Calcified atherosclerosis at the skull base. No suspicious intracranial vascular hyperdensity. Dominant left vertebral artery. Skull: No acute osseous abnormality identified. Sinuses/Orbits: Bubbly opacity now along with mucosal thickening in the visible right maxillary sinus. Similar new bubbly opacity bilateral sphenoid sinuses. Other Visualized paranasal sinuses and mastoids are clear. Other: Benign left posterior convexity scalp lipoma series 4, image 40. No acute orbit or scalp soft tissue finding identified. ASPECTS [facility] Stroke Program Early CT Score) Total score (0-10 with 10 being normal): 10 IMPRESSION: 1. No acute cortically based infarct or acute intracranial hemorrhage identified. ASPECTS 10. 2. Mild small vessel disease for age, although appears progressed since 2021. Electronically Signed: By: Althea Grimmer.D.  On: 04/25/2022 07:42   (Echo, Carotid, EGD, Colonoscopy, ERCP)    Subjective: Patient seen and examined no overnight events.  Granddaughter helping to translate with the patient's request.   Discharge Exam: Vitals:   05/02/22 0807 05/02/22 1230  BP: (!) 158/93 (!) 145/71  Pulse: 63 (!) 49  Resp: 15 19  Temp: 97.8 F (36.6 C) (!) 97.5 F (36.4 C)  SpO2: 100% 97%   Vitals:   05/01/22 2007 05/02/22 0455 05/02/22 0807 05/02/22 1230  BP: (!) 149/85 (!) 166/89 (!) 158/93 (!) 145/71  Pulse: 77 61 63 (!) 49  Resp: 17 18 15 19   Temp: 98.1 F (36.7 C) 98.4 F (36.9 C) 97.8 F (36.6 C) (!) 97.5 F (36.4 C)  TempSrc: Oral  Oral Oral  SpO2: 98% 99% 100% 97%  Weight:      Height:        General: Pt is alert, awake, not  in acute distress Cardiovascular: RRR, S1/S2 +, no rubs, no gallops Respiratory: CTA bilaterally, no wheezing, no rhonchi Abdominal: Soft, NT, ND, bowel sounds + Extremities: no edema, no cyanosis Alert oriented x4. Mild left facial droop. Left upper and lower extremity weakness 4+/5.    The results of significant diagnostics from this hospitalization (including imaging, microbiology, ancillary and laboratory) are listed below for reference.     Microbiology: No results found for this or any previous visit (from the past 240 hour(s)).   Labs: BNP (last 3 results) No results for input(s): "BNP" in the last 8760 hours. Basic Metabolic Panel: No results for input(s): "NA", "K", "CL", "CO2", "GLUCOSE", "BUN", "CREATININE", "CALCIUM", "MG", "PHOS" in the last 168 hours. Liver Function Tests: No results for input(s): "AST", "ALT", "ALKPHOS", "BILITOT", "PROT", "ALBUMIN" in the last 168 hours. No results for input(s): "LIPASE", "AMYLASE" in the last 168 hours. No results for input(s): "AMMONIA" in the last 168 hours. CBC: Recent Labs  Lab 04/30/22 0504  WBC 9.4  HGB 15.7  HCT 45.6  MCV 87.9  PLT 225   Cardiac Enzymes: No results for input(s): "CKTOTAL", "CKMB", "CKMBINDEX", "TROPONINI" in the last 168 hours. BNP: Invalid input(s): "POCBNP" CBG: No results for input(s): "GLUCAP" in the last 168 hours. D-Dimer No results for input(s): "DDIMER" in the last 72 hours. Hgb A1c No results for input(s): "HGBA1C" in the last 72 hours. Lipid Profile No results for input(s): "CHOL", "HDL", "LDLCALC", "TRIG", "CHOLHDL", "LDLDIRECT" in the last 72 hours. Thyroid function studies No results for input(s): "TSH", "T4TOTAL", "T3FREE", "THYROIDAB" in the last 72 hours.  Invalid input(s): "FREET3" Anemia work up No results for input(s): "VITAMINB12", "FOLATE", "FERRITIN", "TIBC", "IRON", "RETICCTPCT" in the last 72 hours. Urinalysis    Component Value Date/Time   COLORURINE YELLOW  07/15/2021 0520   APPEARANCEUR CLEAR 07/15/2021 0520   APPEARANCEUR Cloudy (A) 10/25/2020 1303   LABSPEC 1.025 07/15/2021 0520   LABSPEC 1.015 11/09/2012 1958   PHURINE 5.5 07/15/2021 0520   GLUCOSEU NEGATIVE 07/15/2021 0520   GLUCOSEU Negative 11/09/2012 1958   HGBUR LARGE (A) 07/15/2021 0520   BILIRUBINUR NEGATIVE 07/15/2021 0520   BILIRUBINUR Negative 10/25/2020 1303   BILIRUBINUR Negative 11/09/2012 1958   KETONESUR TRACE (A) 07/15/2021 0520   PROTEINUR 100 (A) 07/15/2021 0520   NITRITE POSITIVE (A) 07/15/2021 0520   LEUKOCYTESUR SMALL (A) 07/15/2021 0520   LEUKOCYTESUR 3+ 11/09/2012 1958   Sepsis Labs Recent Labs  Lab 04/30/22 0504  WBC 9.4   Microbiology No results found for this or any previous visit (from the past 240  hour(s)).   Time coordinating discharge: 35 minutes  SIGNED:   Dorcas CarrowKuber Kvion Shapley, MD  [facility] 05/02/2022, 1:20 PM

## 2022-05-03 ENCOUNTER — Inpatient Hospital Stay: Payer: Medicare Other

## 2022-05-03 DIAGNOSIS — I6312 Cerebral infarction due to embolism of basilar artery: Secondary | ICD-10-CM | POA: Diagnosis not present

## 2022-05-03 MED ORDER — SODIUM CHLORIDE 0.9 % IV SOLN: INTRAVENOUS | Status: DC

## 2022-05-03 NOTE — Progress Notes (Signed)
Speech Language Pathology Treatment: Dysphagia  Patient Details Name: Brian [name] MRN: [id number] DOB: 05-Aug-1941 Today's Date: 05/03/2022 Time: 2174-7159 SLP Time Calculation (min) (ACUTE ONLY): 15 min  Assessment / Plan / Recommendation Clinical Impression  Skilled treatment session focused on reviewing results of Modified Barium Swallow Study including video playback and detailed discussion of aspiration precautions as well as pneumonia prevention. Pt's daughter reports that she feels pt has been having these "stroke like symptoms" such as slurred speech and swallowing difficulty for quite some time. As such, continue to recommend thin liquids via cup. No further ST services.     HPI HPI: Pt is an 81 yo male that presented to ED for L sided weakness and slurred speech. Neurology consult pending, initial note indicates possible posterior circulation versus right hemispheric stroke. PMH includes COPD, HTN, HIV. CTA head and neck: "1. No emergent large vessel occlusion.2. Moderate left P2 PCA stenosis.3. 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 Partially imaged ground-glass opacities in the visualized lung apices, which could represent expiratory changes/atelectasis and/or pneumonia. CT of the chest could further evaluate if clinically warranted." No MRI d/t pacemaker.      SLP Plan  All goals met      Recommendations for follow up therapy are one component of a multi-disciplinary discharge planning process, led by the attending physician.  Recommendations may be updated based on patient status, additional functional criteria and insurance authorization.    Recommendations  Diet recommendations: Regular;Thin liquid Liquids provided via: Cup Medication Administration: Whole meds with liquid Supervision: Patient able to self feed Compensations: Slow rate;Minimize environmental distractions;Small sips/bites;Lingual sweep for clearance of  pocketing Postural Changes and/or Swallow Maneuvers: Out of bed for meals                Oral Care Recommendations: Oral care BID Follow Up Recommendations: Skilled nursing-short term rehab (<3 hours/day) Assistance recommended at discharge: Frequent or constant Supervision/Assistance SLP Visit Diagnosis: Dysphagia, oropharyngeal phase (R13.12) Plan: All goals met        Alessio Bogan B. Rutherford Nail, M.S., CCC-SLP, Mining engineer Certified Brain Injury [place]  [place] Office [phone] Ascom [phone] Fax [phone]

## 2022-05-03 NOTE — Care Management Important Message (Signed)
Important Message  Patient Details  Name: Brian [name] MRN: [id number] Date of Birth: Jan 04, 1941   Medicare Important Message Given:  Yes     [name] A Albertina Leise 05/03/2022, 1:10 PM

## 2022-05-03 NOTE — Progress Notes (Signed)
Modified Barium Swallow Progress Note  Patient Details  Name: Brian [name] MRN: [id number] Date of Birth: 27-Jan-1941  Today's Date: 05/03/2022  Modified Barium Swallow completed.  Full report located under Chart Review in the Imaging Section.  Brief recommendations include the following:  Clinical Impression  Pt presents with mildly delayed swallow initiation that results in trace silent aspiration of nectar thick liquids and thin liquids when consuming large cup sips.  When cued to take small sips, pt was able to eliminate aspiration. Given that pt hasn't had any respiratory decline, is mobile and is currently experience some bladder issues, recommend return to thin liquids via single cup sips. Of note, pt presents within continual throat clears throughout study and report of his throat "burning" when swallowing. These instances were not related to aspiration.   Swallow Evaluation Recommendations       SLP Diet Recommendations: Regular solids;Thin liquid   Liquid Administration via: Cup   Medication Administration: Whole meds with liquid   Supervision: Patient able to self feed   Compensations: Slow rate;Minimize environmental distractions;Small sips/bites;Lingual sweep for clearance of pocketing   Postural Changes: Seated upright at 90 degrees   Oral Care Recommendations: Oral care BID      Jemiah Cuadra B. Dreama Saa, M.S., CCC-SLP, CBIS Speech-Language Pathologist Certified Brain Injury Specialist [facility]  [facility] [phone] Ascom [phone] Fax [phone]   Daoud Lobue Dreama Saa 05/03/2022,3:48 PM

## 2022-05-03 NOTE — Progress Notes (Signed)
Occupational Therapy Treatment Patient Details Name: Brian [name] MRN: [id number] DOB: [birth year] Today's Date: 05/03/2022   History of present illness Pt is an 81 yo male that presented to ED for L sided weakness and slurred speech. 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 PMH of COPD, HTN, HIV.   OT comments  Pt completed bed mobility with MIN A for trunk support to achieve full  upright positioning, CGA-MIN A for STS and several steps with RW + VC for safety. Pt attempts to sit quickly, requiring cue for safety. Once seated, pt able to grasp sandwich with L hand vs BUE hands and bring to his mouth to eat without direct assist and no spillage. Pt required increased time/effort to remove lid from bowl with L hand doing majority of the work and R hand supporting. Pt/family member present instructed in opportunities for therapeutic intervention during daily ADL and activities such as self feeding to improve [facility]. Pt left in recliner, chair alarm on, family present, and with all needs in reach. Continues to benefit from skilled OT services, continue to recommend SNF at this time for additional therapy.    Recommendations for follow up therapy are one component of a multi-disciplinary discharge planning process, led by the attending physician.  Recommendations may be updated based on patient status, additional functional criteria and insurance authorization.    Follow Up Recommendations  Skilled nursing-short term rehab (<3 hours/day)    Assistance Recommended at Discharge Frequent or constant Supervision/Assistance  Patient can return home with the following  Assistance with cooking/housework;Direct supervision/assist for medications management;Help with stairs or ramp for entrance;Assist for transportation;Direct supervision/assist for financial management;Assistance with feeding;A little help with walking and/or  transfers;A lot of help with bathing/dressing/bathroom   Equipment Recommendations  Other (comment) (defer to next venue)    Recommendations for Other Services      Precautions / Restrictions Precautions Precautions: Fall Restrictions Weight Bearing Restrictions: No       Mobility Bed Mobility Overal bed mobility: Needs Assistance Bed Mobility: Supine to Sit     Supine to sit: Min assist     General bed mobility comments: heavy bed rail use, MIN A for trunk support to achieve full upright positioning EOB    Transfers Overall transfer level: Needs assistance Equipment used: Rolling walker (2 wheels) Transfers: Sit to/from Stand Sit to Stand: Min guard     Step pivot transfers: Min guard, Min assist     General transfer comment: CGA-MIN A for safety, VC for safety, quick to sit     Balance Overall balance assessment: Needs assistance Sitting-balance support: Feet supported Sitting balance-Leahy Scale: Fair     Standing balance support: Bilateral upper extremity supported Standing balance-Leahy Scale: Fair                             ADL either performed or assessed with clinical judgement   ADL Overall ADL's : Needs assistance/impaired Eating/Feeding: Sitting;Supervision/ safety Eating/Feeding Details (indicate cue type and reason): PRN set up for smaller packets, but able to take off lid from bowl using L hand as primary and R hand as support, which is an improvement. VC for safety while eating (small bites, slow)  Extremity/Trunk Assessment              Vision       Perception     Praxis      Cognition Arousal/Alertness: Awake/alert Behavior During Therapy: Impulsive Overall Cognitive Status: Impaired/Different from baseline Area of Impairment: Following commands, Safety/judgement, Problem solving                       Following Commands: Follows one step commands  with increased time Safety/Judgement: Decreased awareness of safety, Decreased awareness of deficits   Problem Solving: Requires verbal cues          Exercises Other Exercises Other Exercises: Pt/family instructed in aspiration precautions and opportunities for therapeutic intervention during everyday activities including self feeding    Shoulder Instructions       General Comments      Pertinent Vitals/ Pain       Pain Assessment Pain Assessment: No/denies pain  Home Living                                          Prior Functioning/Environment              Frequency  Min 3X/week        Progress Toward Goals  OT Goals(current goals can now be found in the care plan section)  Progress towards OT goals: Progressing toward goals  Acute Rehab OT Goals Patient Stated Goal: get better OT Goal Formulation: With patient/family Time For Goal Achievement: 05/09/22 Potential to Achieve Goals: Fair  Plan Discharge plan remains appropriate;Frequency remains appropriate    Co-evaluation                 AM-PAC OT "6 Clicks" Daily Activity     Outcome Measure   Help from another person eating meals?: A Little Help from another person taking care of personal grooming?: None Help from another person toileting, which includes using toliet, bedpan, or urinal?: A Lot Help from another person bathing (including washing, rinsing, drying)?: A Lot Help from another person to put on and taking off regular upper body clothing?: A Little Help from another person to put on and taking off regular lower body clothing?: A Lot 6 Click Score: 16    End of Session Equipment Utilized During Treatment: Gait belt;Rolling walker (2 wheels)  OT Visit Diagnosis: Unsteadiness on feet (R26.81);Repeated falls (R29.6);Muscle weakness (generalized) (M62.81);Other symptoms and signs involving the nervous system (R29.898)   Activity Tolerance Patient tolerated treatment  well   Patient Left in chair;with call bell/phone within reach;with chair alarm set;with family/visitor present   Nurse Communication          Time: 1950-9326 OT Time Calculation (min): 11 min  Charges: OT General Charges $OT Visit: 1 Visit OT Treatments $Self Care/Home Management : 8-22 mins  Arman Filter., MPH, MS, OTR/L ascom [phone] 05/03/22, 3:31 PM

## 2022-05-03 NOTE — Progress Notes (Addendum)
Patient seen and examined.  Examined multiple times today.  Family at the bedside.  Patient himself denies any complaints.  Underwent modified barium swallow evaluation, results in the chart.  Risk of aspiration with thin liquid, however he is not consuming any nectar thick liquids.  Risk and benefits were discussed with patient and family, they would like him to keep trying regular food with aspiration precautions.  Still has some evidence of hematuria, cultures are negative.  Does have a history of hematuria in the past. We will continue to challenge with aspirin and Plavix, if worsening bleeding and urine, discontinue aspirin and Plavix.  Instructions provided to the skilled nursing facility.  Addendum,  He had urine in the urinal that was burgundy in color.  May not be safe to discharge patient to nursing home on is still having evidence of bleeding. We will monitor him in the hospital.  Total time spent: 25 minutes

## 2022-05-03 NOTE — TOC Progression Note (Signed)
Transition of Care [facility]) - Progression Note    Patient Details  Name: Brian [name] MRN: [id number] Date of Birth: [birth year]  Transition of Care [facility]) CM/SW Contact  Caryn Section, RN Phone Number: 05/03/2022, 2:49 PM  Clinical Narrative:   Patient continues to have blood in his urine.  Plan is to hydrate patient and continue to monitor.  Patient's discharge is cancelled for today, Tammy at [facility] aware.  Will revisit for discharge [email or web address]    Expected Discharge Plan: Skilled Nursing Facility Barriers to Discharge: Continued Medical Work up  Expected Discharge Plan and Services Expected Discharge Plan: Skilled Nursing Facility In-house Referral: Clinical Social Work   Post Acute Care Choice: Skilled Nursing Facility Living arrangements for the past 2 months: Single Family Home Expected Discharge Date: 05/03/22                                     Social Determinants of Health (SDOH) Interventions    Readmission Risk Interventions     No data to display

## 2022-05-03 NOTE — Discharge Summary (Signed)
Physician Discharge Summary  Brian [name] XBJ:[id number] DOB: [birth year] DOA: 04/25/2022  PCP: Armando Gang, FNP  Admit date: 04/25/2022 Discharge date: 05/04/2022 discharge  Admitted From: Home Disposition: Skilled nursing facility  Recommendations for Outpatient Follow-up:  Follow up with PCP in 1-2 weeks We will send referral to neurology for subsequent follow-up. If patient starts showing more blood in the urine, discontinue Plavix. All-time aspiration precautions.  Home Health: N/A Equipment/Devices: N/A  Discharge Condition: Stable CODE STATUS: Full code Diet recommendation: Low-salt diet, regular consistency with aspiration precautions.  Discharge summary: 81 year old with history of bradycardia status post pacemaker, hypertension, hyperlipidemia, COPD, OSA not using CPAP at night, Spanish-speaking presented to the emergency room with left-sided weakness and difficulty speaking.  Admitted as code stroke.  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  Seen and followed by neurology and vascular surgery.  Recommended dual antiplatelet therapy.  He is working with rehab, recommended SNF placement.    Patient does have history of hematuria.  He had frank hematuria on 7/6.  Lovenox was discontinued.  Aspirin and Plavix was continued, now urine is clearing up leading to slight pink-tinged urine today.  There is a risk of ongoing hematuria.  If there is evidence of frank blood in the urine, discontinue both aspirin and Plavix.     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 CT head findings, no acute abnormalities.  Small vessel disease. MRI of the brain, unable to do.  Pacemaker not compatible to  MRI. CT angiogram head and neck, no emergent large vessel occlusion.  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 2D echocardiogram, ejection fraction 55 to 60%.  Grade 2 diastolic dysfunction.  No intracardiac thrombus. Antiplatelet therapy, none at home.  Neurology recommended aspirin 324 mg daily, Plavix 75 mg daily after loading dose for 3 months.  Will send neurology referral on discharge. LDL 86.  On atorvastatin that is continued.  Hemoglobin A1c 5.8.  No indication for treatment. Speech, regular diet. Reevaluation by speech therapy today, modified barium swallow.  Intermittent aspiration of thin liquid diet.  Patient is unable to maintain oral intake without regular consistency.  Discussed with family.  Will allow regular consistency with precautions. PT OT, recommended SNF.  Vascular surgery and neurology, recommended conservative management. Recommended event monitoring for thromboembolism, previous attending has discussed with Dr. Harl Bowie who is going to interrogate pacemaker for events after discharge.   Right eye conjunctivitis: Evaluated by ophthalmology.  Treated with antibiotic ointment.  Improved.   Essential hypertension: Started on amlodipine.  Resume home dose of metoprolol.   HIV infection: Stable.  On Haart therapy.   COPD: Stable.   Chronic pain anxiety: Patient occasionally uses tramadol and Klonopin for pain relief and anxiety.  Verified with outpatient records.  Will prescribe short-term therapies.  Stable to transfer to SNF. Risk of readmission with extensive medical issues, risk of hematuria.  Hematuria: Started having hematuria on aspirin, Plavix and Lovenox.  Lovenox was discontinued.  He was encouraged hydration.  Ultimately improving from frank hematuria to pink-tinged free-flowing urine today.  Monitor.  If more evidence of bleeding, will need to discontinue Plavix.  Stable to discharge to skilled nursing home  today.  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   Depression with anxiety   Conjunctivitis    Discharge Instructions  Discharge Instructions     Ambulatory referral to Neurology   Complete by: As directed    An appointment is requested in approximately: 4 weeks   Diet - low sodium heart healthy   Complete by: As directed    Increase activity slowly   Complete by: As directed       Allergies as of 05/04/2022   No Known Allergies      Medication List     STOP taking these medications    nystatin-triamcinolone ointment Commonly known as: MYCOLOG   omeprazole 20 MG capsule Commonly known as: PRILOSEC Replaced by: pantoprazole 40 MG tablet       TAKE these medications    alendronate 70 MG tablet Commonly known as: FOSAMAX Take 70 mg by mouth once a week. Take on Saturday.   amLODipine 10 MG tablet Commonly known as: NORVASC Take 1 tablet (10 mg total) by mouth daily.   aspirin 81 MG chewable tablet Chew 4 tablets (324 mg total) by mouth daily.   atorvastatin 40 MG tablet Commonly known as: LIPITOR Take 40 mg by mouth daily.   azelastine 0.05 % ophthalmic solution Commonly known as: OPTIVAR Apply 1 drop to eye 2 (two) times daily.   clonazePAM 0.5 MG tablet Commonly known as: KLONOPIN Take 1 tablet (0.5 mg total) by mouth 2 (two) times daily as needed for up to 5 days for anxiety.   clopidogrel 75 MG tablet Commonly known as: PLAVIX Take 1 tablet (75 mg total) by mouth daily.   DULoxetine 30 MG capsule Commonly known as: CYMBALTA Take 30 mg by mouth daily.   dutasteride 0.5 MG capsule Commonly known as: AVODART Take 1 capsule (0.5 mg total) by mouth daily.    loratadine 10 MG tablet Commonly known as: CLARITIN Take 10 mg by mouth daily.   metoprolol succinate 25 MG 24 hr tablet Commonly known as: TOPROL-XL TAKE 1 TABLET (25 MG TOTAL) BY MOUTH IN THE MORNING AND AT BEDTIME.   pantoprazole 40 MG tablet Commonly known as: PROTONIX Take 1 tablet (40 mg total) by mouth daily. Replaces: omeprazole 20 MG capsule   pregabalin 50 MG capsule Commonly known as: LYRICA Take 50 mg by mouth in the morning and at bedtime.   simethicone 80 MG chewable tablet Commonly known as: MYLICON Chew 1 tablet (80 mg total) by mouth every 6 (six) hours as needed for flatulence.   tamsulosin 0.4 MG Caps capsule Commonly known as: FLOMAX Take 1 capsule (0.4 mg total) by mouth daily.   traMADol-acetaminophen 37.5-325 MG tablet Commonly known as: ULTRACET Take 1 tablet by mouth daily as needed for up to 5 days.   triamcinolone cream 0.1 % Commonly known as: KENALOG Apply 1 application topically 2 (two) times daily.   Triumeq 600-50-300 MG tablet Generic drug: abacavir-dolutegravir-lamiVUDine Tome 1 tableta por va oral diariamente. (Take 1 tablet by mouth daily.)   Ventolin HFA 108 (90 Base) MCG/ACT inhaler Generic drug: albuterol Inhale 2 puffs into the lungs 4 (four) times daily as needed. For shortness of breath and/or wheezing.        Contact information for follow-up providers     Clint Guy,  Fernand Parkins, FNP Follow up in 1 week(s).   Specialty: Family Medicine Contact information: [address] [place] [address] [phone]         Lanier Prude, MD .   Specialties: Cardiology, Radiology Contact information: [address] [place] [address] [phone]         Corky Downs, MD Follow up in 1 week(s).   Specialties: Internal Medicine, Cardiology Why: pacemaker interrigation Contact information: [address] [place] [address] [phone]              Contact information for  after-discharge care     Destination     [facility] Preferred SNF .   Service: Skilled Nursing Contact information: [address] [place] [address] [phone]                    No Known Allergies  Consultations: Neurology, Vascular   Procedures/Studies: DG Swallowing Func-Speech Pathology  Result Date: 05/03/2022 Table formatting from the original result was not included. Objective Swallowing Evaluation: Type of Study: MBS-Modified Barium Swallow Study  Patient Details Name: Brian [name] MRN: [id number] Date of Birth: 1941-01-11 Today's Date: 05/03/2022 Time: SLP Start Time (ACUTE ONLY): 1420 -SLP Stop Time (ACUTE ONLY): 1435 SLP Time Calculation (min) (ACUTE ONLY): 15 min Past Medical History: Past Medical History: Diagnosis Date  Arthritis   Asthma   Bradycardia   Depression   HIV (human immunodeficiency virus infection) (HCC)   Hyperlipidemia  Past Surgical History: Past Surgical History: Procedure Laterality Date  cataracts    EYE SURGERY    PACEMAKER INSERTION N/A 08/02/2015  Procedure: INSERTION PACEMAKER;  Surgeon: Corky Downs, MD;  Location: [name];  Service: Cardiovascular;  Laterality: N/A; HPI: Pt is an 81 yo male that presented to ED for L sided weakness and slurred speech. Neurology consult pending, initial note indicates possible posterior circulation versus right hemispheric stroke. PMH includes COPD, HTN, HIV. CTA head and neck: "1. No emergent large vessel occlusion.2. Moderate left P2 PCA stenosis.3. 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 Partially imaged ground-glass opacities in the visualized lung apices, which could represent expiratory changes/atelectasis and/or pneumonia. CT of the chest could further evaluate if clinically warranted." No MRI d/t pacemaker.  Subjective: Pt pleasant, interpreter present  Recommendations for follow up therapy are one component of a multi-disciplinary  discharge planning process, led by the attending physician.  Recommendations may be updated based on patient status, additional functional criteria and insurance authorization. Assessment / Plan / Recommendation   05/03/2022   3:49 PM Clinical Impressions SLP Visit Diagnosis Dysphagia, oropharyngeal phase (R13.12)     05/03/2022   3:24 PM Treatment Recommendations Treatment Recommendations No treatment recommended at this time     05/03/2022   3:24 PM Prognosis Prognosis for Safe Diet Advancement Good   05/03/2022   3:49 PM Diet Recommendations Compensations Slow rate;Minimize environmental distractions;Small sips/bites;Lingual sweep for clearance of pocketing     05/03/2022   3:49 PM Other Recommendations Follow Up Recommendations Skilled nursing-short term rehab (<3 hours/day) Assistance recommended at discharge Frequent or constant Supervision/Assistance   05/03/2022   3:24 PM Frequency and Duration  Speech Therapy Frequency (ACUTE ONLY) min 2x/week Treatment Duration 2 weeks     05/03/2022   3:24 PM Oral Phase Oral Phase [facility]    05/03/2022   3:24 PM Pharyngeal Phase Pharyngeal Phase Impaired Pharyngeal- Nectar Teaspoon Delayed swallow initiation-vallecula Pharyngeal Material does not enter  airway Pharyngeal- Nectar Cup Delayed swallow initiation-pyriform sinuses;Trace aspiration;Penetration/Aspiration during swallow;Penetration/Aspiration before swallow Pharyngeal Material enters airway, remains ABOVE vocal cords and not ejected out;Material enters airway, remains ABOVE vocal cords then ejected out Pharyngeal- Thin Teaspoon Delayed swallow initiation-pyriform sinuses;Penetration/Aspiration before swallow;Penetration/Aspiration during swallow Pharyngeal Material enters airway, passes BELOW cords without attempt by patient to eject out (silent aspiration);Material does not enter airway Pharyngeal- Thin Cup Delayed swallow initiation-pyriform sinuses;Trace aspiration;Penetration/Aspiration during swallow Pharyngeal Material enters  airway, passes BELOW cords without attempt by patient to eject out (silent aspiration)    05/03/2022   3:24 PM Cervical Esophageal Phase  [facility]ophageal [facility] Happi Overton 05/03/2022, 4:02 PM                     ECHOCARDIOGRAM COMPLETE  Result Date: 04/25/2022    ECHOCARDIOGRAM REPORT   Patient Name:   Brian [name] Date of Exam: 04/25/2022 Medical Rec #:  [id number]    Height:       66.0 in Accession #:    [id number]   Weight:       167.5 lb Date of Birth:  09/28/1941     BSA:          1.855 m Patient Age:    81 years     BP:           151/91 mmHg Patient Gender: M            HR:           50 bpm. Exam Location:  [facility] Procedure: 2D Echo, Cardiac Doppler and Color Doppler Indications:     Stroke I63.9  History:         Patient has prior history of Echocardiogram examinations, most                  recent 07/14/2021. Risk Factors:Dyslipidemia. Bradycardia.  Sonographer:     Cristela BlueJerry Hege Referring Phys:  [id number] Brien FewXILIN NIU Diagnosing Phys: Debbe OdeaBrian Agbor-Etang MD  Sonographer Comments: Suboptimal apical window. IMPRESSIONS  1. Left ventricular ejection fraction, by estimation, is 55 to 60%. The left ventricle has normal function. The left ventricle has no regional wall motion abnormalities. There is mild left ventricular hypertrophy. Left ventricular diastolic parameters are consistent with Grade II diastolic dysfunction (pseudonormalization).  2. Right ventricular systolic function is normal. The right ventricular size is normal.  3. Left atrial size was mildly dilated.  4. The mitral valve is normal in structure. No evidence of mitral valve regurgitation.  5. The aortic valve is grossly normal. Aortic valve regurgitation is not visualized.  6. Aortic dilatation noted. There is mild dilatation of the aortic root, measuring 39 mm.  7. The inferior vena cava is normal in size with <50% respiratory variability, suggesting right atrial pressure of 8 mmHg. FINDINGS  Left Ventricle: Left ventricular ejection fraction, by  estimation, is 55 to 60%. The left ventricle has normal function. The left ventricle has no regional wall motion abnormalities. The left ventricular internal cavity size was normal in size. There is  mild left ventricular hypertrophy. Left ventricular diastolic parameters are consistent with Grade II diastolic dysfunction (pseudonormalization). Right Ventricle: The right ventricular size is normal. No increase in right ventricular wall thickness. Right ventricular systolic function is normal. Left Atrium: Left atrial size was mildly dilated. Right Atrium: Right atrial size was normal in size. Pericardium: There is no evidence of pericardial effusion. Mitral Valve: The mitral valve is normal in structure. No evidence of mitral valve regurgitation.  Tricuspid Valve: The tricuspid valve is normal in structure. Tricuspid valve regurgitation is not demonstrated. Aortic Valve: The aortic valve is grossly normal. Aortic valve regurgitation is not visualized. Aortic valve mean gradient measures 2.5 mmHg. Aortic valve peak gradient measures 4.6 mmHg. Aortic valve area, by VTI measures 2.36 cm. Pulmonic Valve: The pulmonic valve was normal in structure. Pulmonic valve regurgitation is not visualized. Aorta: Aortic dilatation noted. There is mild dilatation of the aortic root, measuring 39 mm. Venous: The inferior vena cava is normal in size with less than 50% respiratory variability, suggesting right atrial pressure of 8 mmHg. IAS/Shunts: No atrial level shunt detected by color flow Doppler.  LEFT VENTRICLE PLAX 2D LVIDd:         4.40 cm   Diastology LVIDs:         3.00 cm   LV e' medial:    4.35 cm/s LV PW:         1.40 cm   LV E/e' medial:  22.6 LV IVS:        1.20 cm   LV e' lateral:   4.35 cm/s LVOT diam:     2.10 cm   LV E/e' lateral: 22.6 LV SV:         56 LV SV Index:   30 LVOT Area:     3.46 cm  RIGHT VENTRICLE RV Basal diam:  3.30 cm RV S prime:     11.90 cm/s TAPSE (M-mode): 2.3 cm LEFT ATRIUM             Index         RIGHT ATRIUM           Index LA diam:        3.60 cm 1.94 cm/m   RA Area:     16.00 cm LA Vol (A2C):   60.9 ml 32.83 ml/m  RA Volume:   37.90 ml  20.43 ml/m LA Vol (A4C):   68.0 ml 36.66 ml/m LA Biplane Vol: 67.2 ml 36.23 ml/m  AORTIC VALVE AV Area (Vmax):    2.15 cm AV Area (Vmean):   2.10 cm AV Area (VTI):     2.36 cm AV Vmax:           107.15 cm/s AV Vmean:          74.450 cm/s AV VTI:            0.238 m AV Peak Grad:      4.6 mmHg AV Mean Grad:      2.5 mmHg LVOT Vmax:         66.60 cm/s LVOT Vmean:        45.100 cm/s LVOT VTI:          0.162 m LVOT/AV VTI ratio: 0.68  AORTA Ao Root diam: 3.83 cm MITRAL VALVE               TRICUSPID VALVE MV Area (PHT): 2.45 cm    TR Peak grad:   9.1 mmHg MV Decel Time: 310 msec    TR Vmax:        151.00 cm/s MV E velocity: 98.10 cm/s MV A velocity: 78.80 cm/s  SHUNTS MV E/A ratio:  1.24        Systemic VTI:  0.16 m                            Systemic Diam: 2.10 cm Debbe Odea MD Electronically signed  by Debbe Odea MD Signature Date/Time: 04/25/2022/2:41:32 PM    Final    CT ANGIO HEAD NECK W WO CM W PERF (CODE STROKE)  Result Date: 04/25/2022 CLINICAL DATA:  Neuro deficit, acute, stroke suspected left sided weakness EXAM: CT ANGIOGRAPHY HEAD AND NECK TECHNIQUE: Multidetector CT imaging of the head and neck was performed using the standard protocol during bolus administration of intravenous contrast. Multiplanar CT image reconstructions and MIPs were obtained to evaluate the vascular anatomy. Carotid stenosis measurements (when applicable) are obtained utilizing NASCET criteria, using the distal internal carotid diameter as the denominator. RADIATION DOSE REDUCTION: This exam was performed according to the departmental dose-optimization program which includes automated exposure control, adjustment of the mA and/or kV according to patient size and/or use of iterative reconstruction technique. CONTRAST:  OMNIPAQUE IOHEXOL 350 MG/ML SOLN  COMPARISON:  Same day head CT. FINDINGS: CTA NECK FINDINGS Aortic arch: Ulcerated atherosclerosis of the aorta with approximately 3 x 8 mm penetrating ulcer along the aortic arch. Great vessel origins are patent without significant stenosis. Right carotid system: Atherosclerosis at the carotid bifurcation without greater than 50% stenosis. Left carotid system: Atherosclerosis at the carotid bifurcation without greater than 50% stenosis. Vertebral arteries: Left dominant. Both vertebral arteries are patent without significant (greater than 50%) stenosis. Skeleton: Severe multilevel degenerative change in the visualized spine. Other neck: No acute abnormality. Upper chest: Partially imaged ground-glass opacities in the visualized lung apices. Review of the MIP images confirms the above findings CTA HEAD FINDINGS Anterior circulation: Bilateral intracranial ICAs, MCAs, and ACAs are patent without proximal hemodynamically significant stenosis. Diminutive right A1 ACA and right carotid terminus, probably congenital/anatomic variant given prominent left A1 ACA and carotid terminus. Mild ICA atherosclerosis. Posterior circulation: Non dominant/small right vertebral artery appears to largely terminate as PICA, anatomic variant. Left intradural vertebral artery is prominent and patent. Basilar artery and bilateral posterior cerebral arteries are patent. Moderate left P2 PCA stenosis. Venous sinuses: As permitted by contrast timing, patent. Anatomic variants: Detailed above. Review of the MIP images confirms the above findings IMPRESSION: 1. No emergent large vessel occlusion. 2. Moderate left P2 PCA stenosis. 3. 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 4. Partially imaged ground-glass opacities in the visualized lung apices, which could represent expiratory changes/atelectasis and/or pneumonia. CT of the chest could further evaluate if clinically warranted. Electronically Signed   By:  Feliberto Harts M.D.   On: 04/25/2022 08:51   CT HEAD CODE STROKE WO CONTRAST  Addendum Date: 04/25/2022   ADDENDUM REPORT: 04/25/2022 07:54 ADDENDUM: Study discussed by telephone with Dr. Aneta Mins STAFFORD on 04/25/2022 at 0744 hours. Electronically Signed   By: Odessa Fleming M.D.   On: 04/25/2022 07:54   Result Date: 04/25/2022 CLINICAL DATA:  Code stroke.  81 year old male EXAM: CT HEAD WITHOUT CONTRAST TECHNIQUE: Contiguous axial images were obtained from the base of the skull through the vertex without intravenous contrast. RADIATION DOSE REDUCTION: This exam was performed according to the departmental dose-optimization program which includes automated exposure control, adjustment of the mA and/or kV according to patient size and/or use of iterative reconstruction technique. COMPARISON:  Head CT 01/12/2020 FINDINGS: Brain: Mild motion artifact. No midline shift, mass effect, or evidence of intracranial mass lesion. No ventriculomegaly. No acute intracranial hemorrhage identified. Gray-white matter differentiation appears largely stable since 2021 and is largely normal for age. However, there are small chronic appearing lacunar infarcts of the left caudate (series 3, image 16) and left corona radiata (new,  image 18). No cortically based acute infarct identified. Vascular: Calcified atherosclerosis at the skull base. No suspicious intracranial vascular hyperdensity. Dominant left vertebral artery. Skull: No acute osseous abnormality identified. Sinuses/Orbits: Bubbly opacity now along with mucosal thickening in the visible right maxillary sinus. Similar new bubbly opacity bilateral sphenoid sinuses. Other Visualized paranasal sinuses and mastoids are clear. Other: Benign left posterior convexity scalp lipoma series 4, image 40. No acute orbit or scalp soft tissue finding identified. ASPECTS [facility] Stroke Program Early CT Score) Total score (0-10 with 10 being normal): 10 IMPRESSION: 1. No acute cortically based  infarct or acute intracranial hemorrhage identified. ASPECTS 10. 2. Mild small vessel disease for age, although appears progressed since 2021. Electronically Signed: By: Odessa Fleming M.D. On: 04/25/2022 07:42   (Echo, Carotid, EGD, Colonoscopy, ERCP)    Subjective: Seen and examined.  Family at the bedside.  Examined 2 voided urine that is almost clear with light pink tinge.  Patient without complaints.   Discharge Exam: Vitals:   05/04/22 0657 05/04/22 0746  BP: (!) 150/86 (!) 162/76  Pulse: (!) 52 (!) 49  Resp: 16 20  Temp: 97.9 F (36.6 C) 98.4 F (36.9 C)  SpO2: 93% 98%   Vitals:   05/03/22 1722 05/03/22 2025 05/04/22 0657 05/04/22 0746  BP: (!) 144/72 (!) 147/75 (!) 150/86 (!) 162/76  Pulse: (!) 58 (!) 56 (!) 52 (!) 49  Resp: 18 18 16 20   Temp: 97.9 F (36.6 C) 98 F (36.7 C) 97.9 F (36.6 C) 98.4 F (36.9 C)  TempSrc:    Oral  SpO2: 100% 98% 93% 98%  Weight:      Height:        General: Pt is alert, awake, not in acute distress Cardiovascular: RRR, S1/S2 +, no rubs, no gallops Respiratory: CTA bilaterally, no wheezing, no rhonchi Abdominal: Soft, NT, ND, bowel sounds + Extremities: no edema, no cyanosis Alert oriented x4. Mild left facial droop. Left upper and lower extremity weakness 4+/5.    The results of significant diagnostics from this hospitalization (including imaging, microbiology, ancillary and laboratory) are listed below for reference.     Microbiology: Recent Results (from the past 240 hour(s))  Urine Culture     Status: Abnormal (Preliminary result)   Collection Time: 05/02/22  2:10 PM   Specimen: Urine, Clean Catch  Result Value Ref Range Status   Specimen Description   Final    URINE, CLEAN CATCH Performed at [facility], [address] [place], [place]    Special Requests   Final    NONE Performed at [facility], [address] [place], [place]    Culture (A)  Final    80,000 COLONIES/mL GRAM  NEGATIVE RODS CULTURE REINCUBATED FOR BETTER GROWTH Performed at [facility], [address], [place], [place]    Report Status PENDING  Incomplete     Labs: BNP (last 3 results) No results for input(s): "BNP" in the last 8760 hours. Basic Metabolic Panel: No results for input(s): "NA", "K", "CL", "CO2", "GLUCOSE", "BUN", "CREATININE", "CALCIUM", "MG", "PHOS" in the last 168 hours. Liver Function Tests: No results for input(s): "AST", "ALT", "ALKPHOS", "BILITOT", "PROT", "ALBUMIN" in the last 168 hours. No results for input(s): "LIPASE", "AMYLASE" in the last 168 hours. No results for input(s): "AMMONIA" in the last 168 hours. CBC: Recent Labs  Lab 04/30/22 0504  WBC 9.4  HGB 15.7  HCT 45.6  MCV 87.9  PLT 225  Cardiac Enzymes: No results for input(s): "CKTOTAL", "CKMB", "CKMBINDEX", "TROPONINI" in the last 168 hours. BNP: Invalid input(s): "POCBNP" CBG: No results for input(s): "GLUCAP" in the last 168 hours. D-Dimer No results for input(s): "DDIMER" in the last 72 hours. Hgb A1c No results for input(s): "HGBA1C" in the last 72 hours. Lipid Profile No results for input(s): "CHOL", "HDL", "LDLCALC", "TRIG", "CHOLHDL", "LDLDIRECT" in the last 72 hours. Thyroid function studies No results for input(s): "TSH", "T4TOTAL", "T3FREE", "THYROIDAB" in the last 72 hours.  Invalid input(s): "FREET3" Anemia work up No results for input(s): "VITAMINB12", "FOLATE", "FERRITIN", "TIBC", "IRON", "RETICCTPCT" in the last 72 hours. Urinalysis    Component Value Date/Time   COLORURINE RED (A) 05/02/2022 1404   APPEARANCEUR TURBID (A) 05/02/2022 1404   APPEARANCEUR Cloudy (A) 10/25/2020 1303   LABSPEC 1.020 05/02/2022 1404   LABSPEC 1.015 11/09/2012 1958   PHURINE  05/02/2022 1404    TEST NOT REPORTED DUE TO COLOR INTERFERENCE OF URINE PIGMENT   GLUCOSEU (A) 05/02/2022 1404    TEST NOT REPORTED DUE TO COLOR INTERFERENCE OF URINE PIGMENT   GLUCOSEU Negative  11/09/2012 1958   HGBUR (A) 05/02/2022 1404    TEST NOT REPORTED DUE TO COLOR INTERFERENCE OF URINE PIGMENT   BILIRUBINUR (A) 05/02/2022 1404    TEST NOT REPORTED DUE TO COLOR INTERFERENCE OF URINE PIGMENT   BILIRUBINUR Negative 10/25/2020 1303   BILIRUBINUR Negative 11/09/2012 1958   KETONESUR (A) 05/02/2022 1404    TEST NOT REPORTED DUE TO COLOR INTERFERENCE OF URINE PIGMENT   PROTEINUR (A) 05/02/2022 1404    TEST NOT REPORTED DUE TO COLOR INTERFERENCE OF URINE PIGMENT   NITRITE (A) 05/02/2022 1404    TEST NOT REPORTED DUE TO COLOR INTERFERENCE OF URINE PIGMENT   LEUKOCYTESUR (A) 05/02/2022 1404    TEST NOT REPORTED DUE TO COLOR INTERFERENCE OF URINE PIGMENT   LEUKOCYTESUR 3+ 11/09/2012 1958   Sepsis Labs Recent Labs  Lab 04/30/22 0504  WBC 9.4   Microbiology Recent Results (from the past 240 hour(s))  Urine Culture     Status: Abnormal (Preliminary result)   Collection Time: 05/02/22  2:10 PM   Specimen: Urine, Clean Catch  Result Value Ref Range Status   Specimen Description   Final    URINE, CLEAN CATCH Performed at [facility], [address], [place], [place] [address]    Special Requests   Final    NONE Performed at [facility], [address] [place], [place] [address]    Culture (A)  Final    80,000 COLONIES/mL GRAM NEGATIVE RODS CULTURE REINCUBATED FOR BETTER GROWTH Performed at [facility], [address], [place], [place] [address]    Report Status PENDING  Incomplete     Time coordinating discharge: 35 minutes  SIGNED:   Dorcas Carrow, MD  [facility] 05/04/2022, 12:26 PM

## 2022-05-04 DIAGNOSIS — I6312 Cerebral infarction due to embolism of basilar artery: Secondary | ICD-10-CM | POA: Diagnosis not present

## 2022-05-04 NOTE — TOC Transition Note (Addendum)
Transition of Care [facility]) - CM/SW Discharge Note   Patient Details  Name: Brian [name] MRN: [id number] Date of Birth: [birth year]  Transition of Care [facility]) CM/SW Contact:  Brian Quarry, RN Phone Number: 05/04/2022, 12:25 PM   Clinical Narrative:  7/8: Patient is medicaly ready for discharge and transfer to [facility]NF per provider. Contacted Brian [name] at [facility] and they will accept patient today with updated DC summary, and the medication from home. Unit RN indicates family was notified to bring medication to SNF when discharged/transferred. Will transport via [facility] when ready. Report to be called to [phone] and going to Room 806 per [facility]. Provider and Unit RN updated via secure chat. Updated DC Summary inboxed to [facility]. DNR form on hard chart. EMS forms printed to unit printer and [facility] called for transport. Brian Cirri RN CM    Brian [name], Brian [name]     [phone]       Final next level of care: Skilled Nursing Facility Barriers to Discharge: Barriers Resolved   Patient Goals and CMS Choice Patient states their goals for this hospitalization and ongoing recovery are:: to get better   Choice offered to / list presented to : Adult Children  Discharge Placement                Patient to be transferred to facility by: [facility] Name of family member notified: Brian [name], Brian [name] ([name]) [phone] Patient and family notified of of transfer: 05/04/22  Discharge Plan and Services In-house Referral: Clinical Social Work   Post Acute Care Choice: Skilled Nursing Facility          DME Arranged: N/A DME Agency: NA       HH Arranged: NA HH Agency: NA        Social Determinants of Health (SDOH) Interventions     Readmission Risk Interventions     No data to display

## 2022-05-04 NOTE — Progress Notes (Signed)
Physical Therapy Treatment Patient Details Name: Brian [name] MRN: [id number] DOB: 10-15-1941 Today's Date: 05/04/2022   History of Present Illness Pt is an 81 yo male that presented to ED for L sided weakness and slurred speech. 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 PMH of COPD, HTN, HIV.    PT Comments    Pt was long sitting in bed finishing breakfast upon arriving. Was having slight difficulty cutting toast with butter knife due to dexterity deficits. Overall pt continues to be cooperative and motivated but lacks safety awareness or insight of deficits. Interpretor used throughout session. Pt is far from baseline abilities and present with severe balance deficits. Highly recommend use of RW for all OOB activity until balance deficits improve. Pt tolerated session well medically. Continued recommendation for SNF at Dc.    Recommendations for follow up therapy are one component of a multi-disciplinary discharge planning process, led by the attending physician.  Recommendations may be updated based on patient status, additional functional criteria and insurance authorization.  Follow Up Recommendations  Skilled nursing-short term rehab (<3 hours/day)     Assistance Recommended at Discharge Frequent or constant Supervision/Assistance  Patient can return home with the following A little help with walking and/or transfers;A little help with bathing/dressing/bathroom;Assistance with cooking/housework;Assistance with feeding;Direct supervision/assist for medications management;Direct supervision/assist for financial management;Assist for transportation;Help with stairs or ramp for entrance   Equipment Recommendations  Rolling walker (2 wheels)       Precautions / Restrictions Precautions Precautions: Fall Restrictions Weight Bearing Restrictions: No     Mobility  Bed Mobility Overal bed mobility:  Needs Assistance Bed Mobility: Supine to Sit  Supine to sit: HOB elevated, Min assist Sit to supine: Min assist   General bed mobility comments: pt continues to require min assistance to safely exit bed and to return to supine after OOB activity    Transfers Overall transfer level: Needs assistance Equipment used: Rolling walker (2 wheels) Transfers: Sit to/from Stand Sit to Stand: Min guard  General transfer comment: pt continues to require vcs for handplacement, technique improvements, and overall vcing for sequencing.    Ambulation/Gait Ambulation/Gait assistance: Min guard, Min assist Gait Distance (Feet): 200 Feet Assistive device: Rolling walker (2 wheels), Straight cane Gait Pattern/deviations: Step-through pattern, Drifts right/left, Knees buckling Gait velocity: decreased     General Gait Details: pt ambulated a total of 200 ft. CGA for ambulation with RW however attempted gait with SPC and pt required constant assistance. less knee buckling today however pt still is very unsteady. pt is high fall risk and will benefit form continued focus on higher level balance exercises    Balance Overall balance assessment: Needs assistance Sitting-balance support: Feet supported Sitting balance-Leahy Scale: Fair     Standing balance support: Single extremity supported, During functional activity, Reliant on assistive device for balance Standing balance-Leahy Scale: Poor Standing balance comment: pt has severe balance deficits when he does not have BUE support. needs rehab to focus on improving balance and overall safety with ADLs.       Cognition Arousal/Alertness: Awake/alert Behavior During Therapy: WFL for tasks assessed/performed Overall Cognitive Status: Impaired/Different from baseline Area of Impairment: Safety/judgement, Following commands, Awareness      Following Commands: Follows one step commands with increased time Safety/Judgement: Decreased awareness of  deficits, Decreased awareness of safety Awareness: Intellectual Problem Solving: Decreased initiation, Difficulty sequencing, Requires verbal cues, Requires tactile cues General Comments: Pt is  A and O x 2 today. still does not understand full scope of situation and is impulsive at time           General Comments General comments (skin integrity, edema, etc.): session focused on performance of standing balance activity/exercises. He has severe balance deficits and is a high fall risk . poor insight of deficits and safety awareness overall. Balance exercises with eyes closed made pt somewhat nausous.      Pertinent Vitals/Pain Pain Assessment Pain Assessment: No/denies pain     PT Goals (current goals can now be found in the care plan section) Acute Rehab PT Goals Patient Stated Goal: none stated Progress towards PT goals: Progressing toward goals    Frequency    7X/week      PT Plan Current plan remains appropriate    Co-evaluation     PT goals addressed during session: Mobility/safety with mobility;Balance;Proper use of DME;Strengthening/ROM        AM-PAC PT "6 Clicks" Mobility   Outcome Measure  Help needed turning from your back to your side while in a flat bed without using bedrails?: A Little Help needed moving from lying on your back to sitting on the side of a flat bed without using bedrails?: A Little Help needed moving to and from a bed to a chair (including a wheelchair)?: A Little Help needed standing up from a chair using your arms (e.g., wheelchair or bedside chair)?: A Little Help needed to walk in hospital room?: A Little Help needed climbing 3-5 steps with a railing? : A Little 6 Click Score: 18    End of Session Equipment Utilized During Treatment: Gait belt Activity Tolerance: Patient tolerated treatment well Patient left: in bed;with call bell/phone within reach;with bed alarm set Nurse Communication: Mobility status PT Visit Diagnosis: Other  abnormalities of gait and mobility (R26.89);Difficulty in walking, not elsewhere classified (R26.2);Muscle weakness (generalized) (M62.81)     Time: 5277-8242 PT Time Calculation (min) (ACUTE ONLY): 28 min  Charges:  $Gait Training: 8-22 mins $Neuromuscular Re-education: 8-22 mins                     Jetta Lout PTA 05/04/22, 10:57 AM

## 2022-05-04 NOTE — Progress Notes (Signed)
Physical Therapy Treatment Patient Details Name: Brian [name] MRN: [id number] DOB: 12-16-1940 Today's Date: 05/04/2022   History of Present Illness Pt is an 81 yo male that presented to ED for L sided weakness and slurred speech. 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 PMH of COPD, HTN, HIV.    PT Comments    Pt was sitting in recliner upon arriving. He continues to be motivated and cooperative but impulsive. Lacks insight of safety concerns and continues to want to " drive home." Pt is a high fall risk. Did ambulate with and without AD. Only able to ambulate very short distance without AD with knee buckling and poor gait safety. Pt did not use AD at baseline and will greatly benefit from SNF at DC to address deficits while maximizing independence with all ADLs.     Recommendations for follow up therapy are one component of a multi-disciplinary discharge planning process, led by the attending physician.  Recommendations may be updated based on patient status, additional functional criteria and insurance authorization.  Follow Up Recommendations  Skilled nursing-short term rehab (<3 hours/day) (pt is far from baseline and will greatly benefit form rehab at DC to maximize safety with all ADLs)     Assistance Recommended at Discharge Frequent or constant Supervision/Assistance  Patient can return home with the following A little help with walking and/or transfers;A little help with bathing/dressing/bathroom;Assistance with cooking/housework;Assistance with feeding;Direct supervision/assist for medications management;Direct supervision/assist for financial management;Assist for transportation;Help with stairs or ramp for entrance   Equipment Recommendations  Rolling walker (2 wheels)       Precautions / Restrictions Precautions Precautions: Fall Restrictions Weight Bearing Restrictions: No      Mobility  Bed Mobility    General bed mobility comments: pt was recieved in recliner. remained in recliner post session    Transfers Overall transfer level: Needs assistance Equipment used: Rolling walker (2 wheels) Transfers: Sit to/from Stand Sit to Stand: Min guard    General transfer comment: CGA for safety. Continues to require vcs and assistance for safe performance    Ambulation/Gait Ambulation/Gait assistance: Min guard, Mod assist   Assistive device: Rolling walker (2 wheels), None Gait Pattern/deviations: Step-through pattern, Trunk flexed Gait velocity: decreased     General Gait Details: pt ambulated with and without AD. without AD pt has severe knee buckling and poor safety. high fall risk without use of AD. at baseline, pt does not use AD.     Balance Overall balance assessment: Needs assistance Sitting-balance support: Feet supported Sitting balance-Leahy Scale: Fair     Standing balance support: Bilateral upper extremity supported Standing balance-Leahy Scale: Poor Standing balance comment: pt has poor balance without use of AD.           Cognition Arousal/Alertness: Awake/alert Behavior During Therapy: WFL for tasks assessed/performed Overall Cognitive Status: Impaired/Different from baseline Area of Impairment: Safety/judgement, Following commands, Awareness        Following Commands: Follows one step commands with increased time Safety/Judgement: Decreased awareness of safety Awareness: Intellectual Problem Solving: Decreased initiation, Difficulty sequencing, Requires verbal cues, Requires tactile cues General Comments: Pt was A and O x 3 but continues to lack full insight of deficits. Pt's impulsivity makes him high fall risk          PT Goals (current goals can now be found in the care plan section) Acute Rehab PT Goals Patient Stated Goal: to go home and drive  again    Frequency    7X/week      PT Plan Current plan  remains appropriate       AM-PAC PT "6 Clicks" Mobility   Outcome Measure  Help needed turning from your back to your side while in a flat bed without using bedrails?: A Little Help needed moving from lying on your back to sitting on the side of a flat bed without using bedrails?: A Little Help needed moving to and from a bed to a chair (including a wheelchair)?: A Little Help needed standing up from a chair using your arms (e.g., wheelchair or bedside chair)?: A Little Help needed to walk in hospital room?: A Little Help needed climbing 3-5 steps with a railing? : A Little 6 Click Score: 18    End of Session   Activity Tolerance: Patient tolerated treatment well;Patient limited by fatigue Patient left: in chair;with call bell/phone within reach;with chair alarm set;with family/visitor present Nurse Communication: Mobility status PT Visit Diagnosis: Other abnormalities of gait and mobility (R26.89);Difficulty in walking, not elsewhere classified (R26.2);Muscle weakness (generalized) (M62.81)     Time:  -     Charges:                        Jetta Lout PTA 05/04/22, 7:16 AM

## 2022-05-04 NOTE — Progress Notes (Signed)
No new events.  As instructed to monitor for hematuria, overnight staff did not give any urine sample for exam or did not notify us. Collected to urine sample in urinal today, both free-flowing with light pink tinge.  Continuing aspirin and Plavix.  Plan: Patient is stable to discharge to skilled nursing rehab today.  General: Looks comfortable.  Pleasant.  Per the nurse. Cardiovascular: S1-S2 normal.  Pacemaker present. Respiratory: Bilateral clear Gastrointestinal: Soft.  Nontender.  Bowel sound present. Ext: No swelling or edema.  No cyanosis. Neuro: Mild left facial droop.  Left upper and lower extremity 4/5.  Right is normal. Musculoskeletal: No deformities.  Labs reviewed.  Communicated with patient, patient's family at the bedside.    Total time spent in patient care today: 25 minutes.

## 2022-05-05 LAB — URINE CULTURE: Culture: 80000 — AB

## 2022-05-09 ENCOUNTER — Ambulatory Visit: Payer: Medicare Other | Admitting: Nurse Practitioner

## 2022-05-16 ENCOUNTER — Other Ambulatory Visit (HOSPITAL_COMMUNITY): Payer: Self-pay

## 2022-05-20 ENCOUNTER — Other Ambulatory Visit (HOSPITAL_COMMUNITY): Payer: Self-pay

## 2022-05-23 ENCOUNTER — Other Ambulatory Visit: Payer: Self-pay | Admitting: Internal Medicine

## 2022-05-23 ENCOUNTER — Other Ambulatory Visit: Payer: Self-pay | Admitting: Family Medicine

## 2022-05-23 ENCOUNTER — Other Ambulatory Visit (HOSPITAL_COMMUNITY): Payer: Self-pay

## 2022-05-23 DIAGNOSIS — I69391 Dysphagia following cerebral infarction: Secondary | ICD-10-CM

## 2022-05-31 ENCOUNTER — Ambulatory Visit: Payer: Medicare Other | Attending: Family Medicine

## 2022-06-03 ENCOUNTER — Ambulatory Visit (INDEPENDENT_AMBULATORY_CARE_PROVIDER_SITE_OTHER): Payer: Medicare HMO | Admitting: Internal Medicine

## 2022-06-03 DIAGNOSIS — Z950 Presence of cardiac pacemaker: Secondary | ICD-10-CM

## 2022-06-03 DIAGNOSIS — J449 Chronic obstructive pulmonary disease, unspecified: Secondary | ICD-10-CM | POA: Diagnosis not present

## 2022-06-03 DIAGNOSIS — I10 Essential (primary) hypertension: Secondary | ICD-10-CM

## 2022-06-04 ENCOUNTER — Ambulatory Visit (INDEPENDENT_AMBULATORY_CARE_PROVIDER_SITE_OTHER): Payer: Medicare HMO | Admitting: Internal Medicine

## 2022-06-04 ENCOUNTER — Encounter: Payer: Self-pay | Admitting: Internal Medicine

## 2022-06-04 VITALS — BP 134/88 | HR 68 | Ht 66.0 in | Wt 167.0 lb

## 2022-06-04 DIAGNOSIS — I10 Essential (primary) hypertension: Secondary | ICD-10-CM

## 2022-06-04 DIAGNOSIS — I6312 Cerebral infarction due to embolism of basilar artery: Secondary | ICD-10-CM | POA: Diagnosis not present

## 2022-06-04 DIAGNOSIS — Z950 Presence of cardiac pacemaker: Secondary | ICD-10-CM | POA: Diagnosis not present

## 2022-06-04 DIAGNOSIS — I719 Aortic aneurysm of unspecified site, without rupture: Secondary | ICD-10-CM | POA: Diagnosis not present

## 2022-06-04 DIAGNOSIS — B20 Human immunodeficiency virus [HIV] disease: Secondary | ICD-10-CM | POA: Diagnosis not present

## 2022-06-04 DIAGNOSIS — R072 Precordial pain: Secondary | ICD-10-CM

## 2022-06-04 NOTE — Assessment & Plan Note (Signed)
Patient has a history of stroke with weakness in the left side of the body now recovering

## 2022-06-04 NOTE — Assessment & Plan Note (Signed)
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

## 2022-06-04 NOTE — Progress Notes (Signed)
Established Patient Office Visit  Subjective:  Patient ID: Brian [name], male    DOB: 02-22-1941  Age: 81 y.o. MRN: [id number]  CC:  Chief Complaint  Patient presents with   Pacemaker Check    HPI  Machai Desmith presents for pacemaker check follow-up.  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  [facility]  and now he is at home.  He is able to walk without the help of his stick.  Motor power is coming back on the left side.  Speech is unchanged.      At the present time he is  taking aspirin and Plavix, he is not a candidate for aggressive platelet therapy because of hematuria.  Patient needed to be followed up by Dr. Lalla Brothers for episode of ventricular tachycardia, his atrial lead is functioning ,  right ventricular lead is in the coronary sinus and is nonfunctional.   We will refer him back to Dr. Lalla Brothers to manage the ventricular tachycardia and pacemaker, he has an appointment to be seen by an ID specialist for HIV He also has an appointment with neurologist for his stroke. I will follow him for general medical care  Past Medical History:  Diagnosis Date   Arthritis    Asthma    Bradycardia    Depression    HIV (human immunodeficiency virus infection) (HCC)    Hyperlipidemia     Past Surgical History:  Procedure Laterality Date   cataracts     EYE SURGERY     PACEMAKER INSERTION N/A 08/02/2015   Procedure: INSERTION PACEMAKER;  Surgeon: Corky Downs, MD;  Location: [name];  Service: Cardiovascular;  Laterality: N/A;    Family History  Problem Relation Age of Onset   Heart disease Brother    Hypertension Other     Social History   Socioeconomic History   Marital status: Married    Spouse name: Not on file   Number of children: Not on file   Years of education: Not on file   Highest education  level: Not on file  Occupational History   Not on file  Tobacco Use   Smoking status: Never   Smokeless tobacco: Never  Vaping Use   Vaping Use: Never used  Substance and Sexual Activity   Alcohol use: Yes    Alcohol/week: 2.0 standard drinks of alcohol    Types: 2 Cans of beer per week    Comment: 1-2 drinks a day   Drug use: No   Sexual activity: Not on file  Other Topics Concern   Not on file  Social History Narrative   Not on file   Social Determinants of Health   Financial Resource Strain: Not on file  Food Insecurity: Not on file  Transportation Needs: Not on file  Physical Activity: Not on file  Stress: Not on file  Social Connections: Not on file  Intimate Partner Violence: Not on file     Current Outpatient Medications:    abacavir-dolutegravir-lamiVUDine (TRIUMEQ) 600-50-300 MG tablet, Take 1 tablet by mouth daily., Disp: 30 tablet, Rfl: 5   alendronate (FOSAMAX) 70 MG tablet, Take 70 mg by mouth once a week. Take on Saturday., Disp: , Rfl: 5   amLODipine (NORVASC) 10 MG tablet, Take 1 tablet (10 mg total) by mouth daily., Disp: , Rfl:  aspirin 81 MG chewable tablet, Chew 4 tablets (324 mg total) by mouth daily., Disp: 120 tablet, Rfl: 2   atorvastatin (LIPITOR) 40 MG tablet, Take 40 mg by mouth daily., Disp: , Rfl:    azelastine (OPTIVAR) 0.05 % ophthalmic solution, Apply 1 drop to eye 2 (two) times daily., Disp: , Rfl:    clonazePAM (KLONOPIN) 0.5 MG tablet, Take 1 tablet (0.5 mg total) by mouth 2 (two) times daily as needed for up to 5 days for anxiety., Disp: 10 tablet, Rfl: 0   clopidogrel (PLAVIX) 75 MG tablet, Take 1 tablet (75 mg total) by mouth daily., Disp: 30 tablet, Rfl: 2   DULoxetine (CYMBALTA) 30 MG capsule, Take 30 mg by mouth daily., Disp: , Rfl:    dutasteride (AVODART) 0.5 MG capsule, Take 1 capsule (0.5 mg total) by mouth daily., Disp: 30 capsule, Rfl: 11   loratadine (CLARITIN) 10 MG tablet, Take 10 mg by mouth daily., Disp: , Rfl:     metoprolol succinate (TOPROL-XL) 25 MG 24 hr tablet, TAKE 1 TABLET (25 MG TOTAL) BY MOUTH IN THE MORNING AND AT BEDTIME., Disp: 180 tablet, Rfl: 1   pantoprazole (PROTONIX) 40 MG tablet, Take 1 tablet (40 mg total) by mouth daily., Disp: , Rfl:    pregabalin (LYRICA) 50 MG capsule, Take 50 mg by mouth in the morning and at bedtime., Disp: , Rfl:    simethicone (MYLICON) 80 MG chewable tablet, Chew 1 tablet (80 mg total) by mouth every 6 (six) hours as needed for flatulence., Disp: 30 tablet, Rfl: 0   tamsulosin (FLOMAX) 0.4 MG CAPS capsule, Take 1 capsule (0.4 mg total) by mouth daily., Disp: 30 capsule, Rfl: 11   triamcinolone cream (KENALOG) 0.1 %, Apply 1 application topically 2 (two) times daily., Disp: , Rfl:    VENTOLIN HFA 108 (90 BASE) MCG/ACT inhaler, Inhale 2 puffs into the lungs 4 (four) times daily as needed. For shortness of breath and/or wheezing., Disp: , Rfl: 5   No Known Allergies  ROS Review of Systems  Constitutional:  Positive for fatigue. Negative for fever.  HENT:  Negative for congestion and nosebleeds.   Eyes:  Negative for pain.  Respiratory:  Negative for chest tightness.   Cardiovascular:  Negative for chest pain.  Genitourinary:  Negative for dysuria.  Musculoskeletal:  Negative for arthralgias.  Psychiatric/Behavioral:  Positive for confusion and decreased concentration. Negative for agitation and behavioral problems.       Objective:    Physical Exam Vitals reviewed.  Constitutional:      Appearance: Normal appearance. He is ill-appearing.  HENT:     Head: Atraumatic.     Mouth/Throat:     Mouth: Mucous membranes are moist.  Eyes:     Pupils: Pupils are equal, round, and reactive to light.  Neck:     Vascular: No carotid bruit.  Cardiovascular:     Rate and Rhythm: Normal rate and regular rhythm.     Pulses: Normal pulses.     Heart sounds: Normal heart sounds.  Pulmonary:     Effort: Pulmonary effort is normal.     Breath sounds: Normal breath  sounds.  Abdominal:     General: Bowel sounds are normal.     Palpations: Abdomen is soft. There is no hepatomegaly, splenomegaly or mass.     Tenderness: There is no abdominal tenderness.     Hernia: No hernia is present.  Musculoskeletal:     Cervical back: Neck supple.     Right  lower leg: No edema.     Left lower leg: No edema.  Skin:    Findings: No rash.  Neurological:     Mental Status: He is alert and oriented to person, place, and time.     Motor: No weakness.  Psychiatric:        Mood and Affect: Mood normal.        Behavior: Behavior normal.     BP 134/88   Pulse 68   Ht 5\' 6"  (1.676 m)   Wt 167 lb (75.8 kg)   BMI 26.95 kg/m  Wt Readings from Last 3 Encounters:  06/04/22 167 lb (75.8 kg)  04/25/22 167 lb 8.8 oz (76 kg)  12/18/21 164 lb (74.4 kg)     Health Maintenance Due  Topic Date Due   COVID-19 Vaccine (1) Never done   Zoster Vaccines- Shingrix (1 of 2) Never done   INFLUENZA VACCINE  05/28/2022    There are no preventive care reminders to display for this patient.  Lab Results  Component Value Date   TSH 0.532 10/16/2014   Lab Results  Component Value Date   WBC 9.4 04/30/2022   HGB 15.7 04/30/2022   HCT 45.6 04/30/2022   MCV 87.9 04/30/2022   PLT 225 04/30/2022   Lab Results  Component Value Date   NA 138 04/25/2022   K 3.6 04/25/2022   CO2 22 04/25/2022   GLUCOSE 121 (H) 04/25/2022   BUN 11 04/25/2022   CREATININE 0.92 04/25/2022   BILITOT 0.4 04/25/2022   ALKPHOS 42 04/25/2022   AST 28 04/25/2022   ALT 21 04/25/2022   PROT 7.2 04/25/2022   ALBUMIN 3.5 04/25/2022   CALCIUM 8.7 (L) 04/25/2022   ANIONGAP 6 04/25/2022   Lab Results  Component Value Date   CHOL 149 04/26/2022   Lab Results  Component Value Date   HDL 37 (L) 04/26/2022   Lab Results  Component Value Date   LDLCALC 86 04/26/2022   Lab Results  Component Value Date   TRIG 131 04/26/2022   Lab Results  Component Value Date   CHOLHDL 4.0 04/26/2022    Lab Results  Component Value Date   HGBA1C 5.8 (H) 04/25/2022      Assessment & Plan:   Problem List Items Addressed This Visit       Cardiovascular and Mediastinum   Essential hypertension - Primary    Blood pressure is stable, beta-blocker has been stopped because of bradycardia      Stroke [facility])    Patient has a history of stroke with weakness in the left side of the body now recovering      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 for further evaluation We will also get a 24-hour Holter monitor to further evaluate the arrhythmia  Report of the EKG Electrocardiogram shows normal sinus rhythm left anterior hemiblock right bundle block old anteroseptal myocardial infarction.  No orders of the defined types  were placed in this encounter. Patient has a neurology and ID appointment We will make an appointment for him to be seen by Dr. Lalla Brothers in his clinic   Follow-up: No follow-ups on file.    Corky Downs, MD

## 2022-06-04 NOTE — Assessment & Plan Note (Signed)
Refer to ID specialist

## 2022-06-04 NOTE — Assessment & Plan Note (Signed)
We will continue Plavix and aspirin

## 2022-06-04 NOTE — Assessment & Plan Note (Signed)
Blood pressure is stable, beta-blocker has been stopped because of bradycardia

## 2022-06-06 NOTE — Addendum Note (Signed)
Addended by: Jobie Quaker on: 06/06/2022 12:39 PM   Modules accepted: Orders

## 2022-06-16 NOTE — Assessment & Plan Note (Signed)
Patient has nonfunctional right ventricular lead which is in the coronary sinus.  He has a run of ventricular tachycardia.  We will refer him to Dr. Lalla Brothers for further evaluation

## 2022-06-16 NOTE — Progress Notes (Signed)
Established Patient Office Visit  Subjective:  Patient ID: Brian [name], male    DOB: 01/26/1941  Age: 81 y.o. MRN: [id number]  CC: No chief complaint on file.   HPI  Brian [name] presents for pacer check  Past Medical History:  Diagnosis Date   Arthritis    Asthma    Bradycardia    Depression    HIV (human immunodeficiency virus infection) (HCC)    Hyperlipidemia     Past Surgical History:  Procedure Laterality Date   cataracts     EYE SURGERY     PACEMAKER INSERTION N/A 08/02/2015   Procedure: INSERTION PACEMAKER;  Surgeon: Corky Downs, MD;  Location: [name];  Service: Cardiovascular;  Laterality: N/A;    Family History  Problem Relation Age of Onset   Heart disease Brother    Hypertension Other     Social History   Socioeconomic History   Marital status: Married    Spouse name: Not on file   Number of children: Not on file   Years of education: Not on file   Highest education level: Not on file  Occupational History   Not on file  Tobacco Use   Smoking status: Never   Smokeless tobacco: Never  Vaping Use   Vaping Use: Never used  Substance and Sexual Activity   Alcohol use: Yes    Alcohol/week: 2.0 standard drinks of alcohol    Types: 2 Cans of beer per week    Comment: 1-2 drinks a day   Drug use: No   Sexual activity: Not on file  Other Topics Concern   Not on file  Social History Narrative   Not on file   Social Determinants of Health   Financial Resource Strain: Not on file  Food Insecurity: Not on file  Transportation Needs: Not on file  Physical Activity: Not on file  Stress: Not on file  Social Connections: Not on file  Intimate Partner Violence: Not on file     Current Outpatient Medications:    abacavir-dolutegravir-lamiVUDine (TRIUMEQ) 600-50-300 MG tablet, Take 1 tablet by mouth daily., Disp: 30 tablet, Rfl: 5   alendronate (FOSAMAX) 70 MG tablet, Take 70 mg by mouth once a week. Take on Saturday., Disp: , Rfl: 5    amLODipine (NORVASC) 10 MG tablet, Take 1 tablet (10 mg total) by mouth daily., Disp: , Rfl:    aspirin 81 MG chewable tablet, Chew 4 tablets (324 mg total) by mouth daily., Disp: 120 tablet, Rfl: 2   atorvastatin (LIPITOR) 40 MG tablet, Take 40 mg by mouth daily., Disp: , Rfl:    azelastine (OPTIVAR) 0.05 % ophthalmic solution, Apply 1 drop to eye 2 (two) times daily., Disp: , Rfl:    clonazePAM (KLONOPIN) 0.5 MG tablet, Take 1 tablet (0.5 mg total) by mouth 2 (two) times daily as needed for up to 5 days for anxiety., Disp: 10 tablet, Rfl: 0   clopidogrel (PLAVIX) 75 MG tablet, Take 1 tablet (75 mg total) by mouth daily., Disp: 30 tablet, Rfl: 2   DULoxetine (CYMBALTA) 30 MG capsule, Take 30 mg by mouth daily., Disp: , Rfl:    dutasteride (AVODART) 0.5 MG capsule, Take 1 capsule (0.5 mg total) by mouth daily., Disp: 30 capsule, Rfl: 11   loratadine (CLARITIN) 10 MG tablet, Take 10 mg by mouth daily., Disp: , Rfl:    metoprolol succinate (TOPROL-XL) 25 MG 24 hr tablet, TAKE 1 TABLET (25 MG TOTAL) BY MOUTH IN THE MORNING AND AT BEDTIME.,  Disp: 180 tablet, Rfl: 1   pantoprazole (PROTONIX) 40 MG tablet, Take 1 tablet (40 mg total) by mouth daily., Disp: , Rfl:    pregabalin (LYRICA) 50 MG capsule, Take 50 mg by mouth in the morning and at bedtime., Disp: , Rfl:    simethicone (MYLICON) 80 MG chewable tablet, Chew 1 tablet (80 mg total) by mouth every 6 (six) hours as needed for flatulence., Disp: 30 tablet, Rfl: 0   tamsulosin (FLOMAX) 0.4 MG CAPS capsule, Take 1 capsule (0.4 mg total) by mouth daily., Disp: 30 capsule, Rfl: 11   triamcinolone cream (KENALOG) 0.1 %, Apply 1 application topically 2 (two) times daily., Disp: , Rfl:    VENTOLIN HFA 108 (90 BASE) MCG/ACT inhaler, Inhale 2 puffs into the lungs 4 (four) times daily as needed. For shortness of breath and/or wheezing., Disp: , Rfl: 5   No Known Allergies  ROS Review of Systems  Respiratory:  Negative for chest tightness.   Cardiovascular:   Negative for chest pain.  Genitourinary:  Negative for difficulty urinating.  Neurological:  Negative for headaches.      Objective:    Physical Exam Constitutional:      Appearance: He is obese. He is ill-appearing.  Cardiovascular:     Rate and Rhythm: Normal rate.  Pulmonary:     Breath sounds: No wheezing or rales.  Abdominal:     Palpations: Abdomen is soft.     There were no vitals taken for this visit. Wt Readings from Last 3 Encounters:  06/04/22 167 lb (75.8 kg)  04/25/22 167 lb 8.8 oz (76 kg)  12/18/21 164 lb (74.4 kg)     Health Maintenance Due  Topic Date Due   COVID-19 Vaccine (1) Never done   Zoster Vaccines- Shingrix (1 of 2) Never done   INFLUENZA VACCINE  05/28/2022    There are no preventive care reminders to display for this patient.  Lab Results  Component Value Date   TSH 0.532 10/16/2014   Lab Results  Component Value Date   WBC 9.4 04/30/2022   HGB 15.7 04/30/2022   HCT 45.6 04/30/2022   MCV 87.9 04/30/2022   PLT 225 04/30/2022   Lab Results  Component Value Date   NA 138 04/25/2022   K 3.6 04/25/2022   CO2 22 04/25/2022   GLUCOSE 121 (H) 04/25/2022   BUN 11 04/25/2022   CREATININE 0.92 04/25/2022   BILITOT 0.4 04/25/2022   ALKPHOS 42 04/25/2022   AST 28 04/25/2022   ALT 21 04/25/2022   PROT 7.2 04/25/2022   ALBUMIN 3.5 04/25/2022   CALCIUM 8.7 (L) 04/25/2022   ANIONGAP 6 04/25/2022   Lab Results  Component Value Date   CHOL 149 04/26/2022   Lab Results  Component Value Date   HDL 37 (L) 04/26/2022   Lab Results  Component Value Date   LDLCALC 86 04/26/2022   Lab Results  Component Value Date   TRIG 131 04/26/2022   Lab Results  Component Value Date   CHOLHDL 4.0 04/26/2022   Lab Results  Component Value Date   HGBA1C 5.8 (H) 04/25/2022      Assessment & Plan:   Problem List Items Addressed This Visit       Cardiovascular and Mediastinum   Essential hypertension - Primary    Stable at the  present time        Respiratory   COPD (chronic obstructive pulmonary disease) (HCC)    Chronic problem  Other   Cardiac pacemaker    Patient has nonfunctional right ventricular lead which is in the coronary sinus.  He has a run of ventricular tachycardia.  We will refer him to Dr. Lalla Brothers for further evaluation       No orders of the defined types were placed in this encounter.   Follow-up: No follow-ups on file.    Corky Downs, MD

## 2022-06-16 NOTE — Assessment & Plan Note (Signed)
Chronic problem. 

## 2022-06-16 NOTE — Assessment & Plan Note (Signed)
Stable at the present time. 

## 2022-06-17 ENCOUNTER — Other Ambulatory Visit (HOSPITAL_COMMUNITY): Payer: Self-pay

## 2022-06-18 ENCOUNTER — Ambulatory Visit: Payer: Medicare Other | Admitting: Infectious Diseases

## 2022-06-20 ENCOUNTER — Other Ambulatory Visit (HOSPITAL_COMMUNITY): Payer: Self-pay

## 2022-06-24 ENCOUNTER — Ambulatory Visit: Payer: Medicare HMO | Admitting: Diagnostic Neuroimaging

## 2022-06-24 ENCOUNTER — Encounter: Payer: Self-pay | Admitting: Diagnostic Neuroimaging

## 2022-06-25 ENCOUNTER — Encounter: Payer: Self-pay | Admitting: Infectious Diseases

## 2022-06-25 ENCOUNTER — Other Ambulatory Visit
Admission: RE | Admit: 2022-06-25 | Discharge: 2022-06-25 | Disposition: A | Discharge status: Home / Self Care | Payer: Medicare HMO | Source: Ambulatory Visit | Attending: Infectious Diseases | Admitting: Infectious Diseases

## 2022-06-25 ENCOUNTER — Ambulatory Visit: Payer: Medicare HMO | Attending: Infectious Diseases | Admitting: Infectious Diseases

## 2022-06-25 VITALS — BP 162/116 | HR 86 | Temp 97.1°F | Ht 67.0 in | Wt 156.0 lb

## 2022-06-25 DIAGNOSIS — B20 Human immunodeficiency virus [HIV] disease: Secondary | ICD-10-CM | POA: Insufficient documentation

## 2022-06-25 DIAGNOSIS — Z8616 Personal history of COVID-19: Secondary | ICD-10-CM | POA: Diagnosis not present

## 2022-06-25 DIAGNOSIS — I10 Essential (primary) hypertension: Secondary | ICD-10-CM | POA: Diagnosis not present

## 2022-06-25 DIAGNOSIS — Z7982 Long term (current) use of aspirin: Secondary | ICD-10-CM | POA: Insufficient documentation

## 2022-06-25 DIAGNOSIS — Z8673 Personal history of transient ischemic attack (TIA), and cerebral infarction without residual deficits: Secondary | ICD-10-CM | POA: Insufficient documentation

## 2022-06-25 DIAGNOSIS — E785 Hyperlipidemia, unspecified: Secondary | ICD-10-CM | POA: Diagnosis not present

## 2022-06-25 DIAGNOSIS — Z7902 Long term (current) use of antithrombotics/antiplatelets: Secondary | ICD-10-CM | POA: Diagnosis not present

## 2022-06-25 DIAGNOSIS — Z950 Presence of cardiac pacemaker: Secondary | ICD-10-CM | POA: Diagnosis not present

## 2022-06-25 NOTE — Progress Notes (Unsigned)
NAME: Brian [name]  DOB: 1941/04/10  MRN: [id number]  Date/Time: 06/25/2022 12:11 PM Subjective:  Patient here for follow-up of HIV   Brian [name] is a 81 y.o. 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 [place] interpreter was in the visit. Pt is doing well 100% adherent to HAARt- takes triumeq No complaints Since Last  time I saw him in Aug 2022  Pt was in [facility] 9/16-9/21/2022 for substernal chest pain and ruled out cardiac issue- also got treated for rt lower extremity cellulitis He had Covid infection and had post COVID respiratory changes needing oxygen at home.  Today he says he is doing much better.  He has been off and oxygen for quite a few months now. He is 100% adherent to HAART His last Vl was 40 and cd4 was 388 Says Triumeq is being delivered from [facility].  And it is more regular now.  HIV history Diagnosed in 2014 when he found out that his wife in [name] had died of AIDS. He is originally from [place] but has been in [place] for the past 31 years HIV diagnosed 2014 1st visit to [facility] 11/25/13 Nadir cd4 was 120 (5%) on 10/29/2013 VL 273,240 from 10/29/2013  HAARt history 1st regimen was complera 2nd regimen triumeq As per Dr.Fitzgerald's note on 08/06/16  Recommendations HIV -he has had some issues with compliance with Complera in the past. His viral load has been slightly elevated now for several checks.  I would like to consider switching him to another regimen potentially more potent given the concern for developing resistance. He had pan sensitive virus on genotype in 2015. WIll check HLA [id number] and if neg start triumeq   Telephone Encounter - [name], MD - 08/29/2016 11:35 AM EDT please let pt know that his testing we did to change his medicine to a stronger one was good  I have sent in a new medicine triumeq to take in place of the Complera. He should stop the  complera once he gets the triumeq. He should call if any new problems - otherwise I will see him in Jan    Electronically signed by [name], MD at 08/29/2016 11:39 AM EDT    Acquired thru- heterosexual contact Genotype highly sensitive organism  ? Past Medical History:  Diagnosis Date   Arthritis    Asthma    Bradycardia    Depression    HIV (human immunodeficiency virus infection) ([place])    Hyperlipidemia   Osteoporosis   Past Surgical History:  Procedure Laterality Date   cataracts     EYE SURGERY     PACEMAKER INSERTION N/A 08/02/2015   Procedure: INSERTION PACEMAKER;  Surgeon: Brian Athens, MD;  Location: [name];  Service: Cardiovascular;  Laterality: N/A;  prosthesis for urinary flow   SH Lives on his own 6 children and many grandchildren Non smoker No alcohol or illicit drug use   Family History  Problem Relation Age of Onset   Heart disease Brother    Hypertension Other    No Known Allergies      REVIEW OF SYSTEMS:  Const: negative fever, negative chills, negative weight loss Eyes: negative diplopia or visual changes, negative eye pain ENT: negative coryza, negative sore throat Resp: negative cough, hemoptysis, no dyspnea on exertion.  3 cards: negative for chest pain, palpitations, lower extremity edema GU: negative for frequency, dysuria and hematuria Skin: negative for rash and  pruritus Heme: negative for easy bruising and gum/nose bleeding MS: negative for myalgias, arthralgias, back pain and muscle weakness Neurolo: no headache or dizziness Psych: negative for feelings of anxiety, depression   Objective:  BP (!) 162/116   Pulse 86   Temp (!) 97.1 F (36.2 C) (Temporal)   Ht '5\' 7"'  (1.702 m)   Wt 156 lb (70.8 kg)   BMI 24.43 kg/m    PHYSICAL EXAM:  General: Well. Head: Normocephalic, without obvious abnormality, atraumatic. Eyes: Conjunctivae clear, anicteric sclerae. Pupils are equal Nose: Nares normal. No drainage or  sinus tenderness. Throat: Lips, mucosa, and tongue normal. No Thrush Neck: Supple, symmetrical, no adenopathy, thyroid: non tender no carotid bruit and no JVD. Back: No CVA tenderness. Lungs: Bilateral air entry. Heart: Regular rate and rhythm, no murmur, rub or gallop.pacemaker site fine Abdomen: Soft, non-tender,not distended. Bowel sounds normal. No masses Skin: No rashes or lesions. Not Jaundiced Lymph: Cervical, supraclavicular normal. Neurologic: Grossly non-focal   Health maintenance Vaccination   Vaccine Date last given comment  Influenza    Hepatitis B    Hepatitis A    Prevnar-PCV-13 12/01/18   Pneumovac-PPSV-23 2016   TdaP 12/01/18   HPV    Shingrix ( zoster vaccine)     ______________________  Labs Lab Result  Date comment  HIV VL 330 02/03/20   CD4 259 (25%) 02/03/20   Genotype Sensitive virus 10/29/2013 Genosure prime  [id number] NEgative 08/06/16   HIV antibody     RPR NR 02/03/20   Quantiferon Gold neg 02/03/20   Hep C ab NR 02/03/20   Hepatitis B-ab,ag,c     Hepatitis A-IgM, IgG /T     Lipid     GC/CHL          HB,PLT,Cr, LFT 14.5/218/0.99 06/01/19     Preventive  Procedure Result  Date comment  colonoscopy   none       Dental exam   Not recently  Opthal   Few months ago     Impression/Recommendation ?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  We will do labs today  Recovered Covid infection HTN- controlled pacemaker for symptomatic bradycardia recent   Hematuria- one episode-was followed by Dr.Sninsky-he refused cystoscopy- he has no further hematuria Hyperlipidemia on atorvastatin  Prescriptions is now from [facility] We will follow patient in 6 months. We will do labs today. Discussed the management with the patient through interpreter.

## 2022-06-26 LAB — T-HELPER CELLS CD4/CD8 %
% CD 4 Pos. Lymph.: 26.7 % — ABNORMAL LOW (ref 30.8–58.5)
Absolute CD 4 Helper: 401 /uL (ref 359–1519)
Basophils Absolute: 0 10*3/uL (ref 0.0–0.2)
Basos: 0 %
CD3+CD4+ Cells/CD3+CD8+ Cells Bld: 0.71 — ABNORMAL LOW (ref 0.92–3.72)
CD3+CD8+ Cells # Bld: 561 /uL (ref 109–897)
CD3+CD8+ Cells NFr Bld: 37.4 % — ABNORMAL HIGH (ref 12.0–35.5)
EOS (ABSOLUTE): 0.1 10*3/uL (ref 0.0–0.4)
Eos: 1 %
Hematocrit: 46.1 % (ref 37.5–51.0)
Hemoglobin: 15.6 g/dL (ref 13.0–17.7)
Immature Grans (Abs): 0 10*3/uL (ref 0.0–0.1)
Immature Granulocytes: 0 %
Lymphocytes Absolute: 1.5 10*3/uL (ref 0.7–3.1)
Lymphs: 17 %
MCH: 30.4 pg (ref 26.6–33.0)
MCHC: 33.8 g/dL (ref 31.5–35.7)
MCV: 90 fL (ref 79–97)
Monocytes Absolute: 0.6 10*3/uL (ref 0.1–0.9)
Monocytes: 7 %
Neutrophils Absolute: 6.7 10*3/uL (ref 1.4–7.0)
Neutrophils: 75 %
Platelets: 232 10*3/uL (ref 150–450)
RBC: 5.13 x10E6/uL (ref 4.14–5.80)
RDW: 15.4 % (ref 11.6–15.4)
WBC: 9.1 10*3/uL (ref 3.4–10.8)

## 2022-06-26 LAB — HIV-1 RNA QUANT-NO REFLEX-BLD
HIV 1 RNA Quant: <20 copies/mL
LOG10 HIV-1 RNA: UNDETERMINED log10copy/mL

## 2022-07-11 ENCOUNTER — Other Ambulatory Visit (HOSPITAL_COMMUNITY): Payer: Self-pay

## 2022-07-13 LAB — MISC LABCORP TEST (SEND OUT): Labcorp test code: 551776

## 2022-07-17 ENCOUNTER — Other Ambulatory Visit (HOSPITAL_COMMUNITY): Payer: Self-pay

## 2022-07-18 ENCOUNTER — Other Ambulatory Visit (HOSPITAL_COMMUNITY): Payer: Self-pay

## 2022-07-30 NOTE — Progress Notes (Deleted)
Electrophysiology Office Follow up Visit Note:    Date:  07/30/2022   ID:  Brian [name], DOB 11/30/1940, MRN [id number]  PCP:  Cletis Athens, MD  [facility] Cardiologist:  None  [facility] Electrophysiologist:  Vickie Epley, MD    Interval History:    Brian [name] is a 81 y.o. male who presents for a follow up visit.  I last saw the patient April 11, 2021 for a pacemaker lead malfunction.  At that appointment I ordered a CT lead extraction protocol to assess the position of the right ventricular lead.  The CT scan showed this lead was within the middle cardiac vein. A Spanish interpreter was used during this visit.  I last saw the patient in July 2022.  Since our last visit,***      Past Medical History:  Diagnosis Date   Arthritis    Asthma    Bradycardia    Depression    HIV (human immunodeficiency virus infection) ([place])    Hyperlipidemia     Past Surgical History:  Procedure Laterality Date   cataracts     EYE SURGERY     PACEMAKER INSERTION N/A 08/02/2015   Procedure: INSERTION PACEMAKER;  Surgeon: Cletis Athens, MD;  Location: [name];  Service: Cardiovascular;  Laterality: N/A;    Current Medications: No outpatient medications have been marked as taking for the 07/31/22 encounter (Appointment) with Vickie Epley, MD.     Allergies:   Patient has no known allergies.   Social History   Socioeconomic History   Marital status: Married    Spouse name: Not on file   Number of children: Not on file   Years of education: Not on file   Highest education level: Not on file  Occupational History   Not on file  Tobacco Use   Smoking status: Never   Smokeless tobacco: Never  Vaping Use   Vaping Use: Never used  Substance and Sexual Activity   Alcohol use: Yes    Alcohol/week: 2.0 standard drinks of alcohol    Types: 2 Cans of beer per week    Comment: 1-2 drinks a day   Drug use: No   Sexual activity: Not on file  Other Topics Concern   Not  on file  Social History Narrative   Not on file   Social Determinants of Health   Financial Resource Strain: Not on file  Food Insecurity: Not on file  Transportation Needs: Not on file  Physical Activity: Not on file  Stress: Not on file  Social Connections: Not on file     Family History: The patient's family history includes Heart disease in his brother; Hypertension in an other family member.  ROS:   Please see the history of present illness.    All other systems reviewed and are negative.  EKGs/Labs/Other Studies Reviewed:    The following studies were reviewed today:  April 23, 2021 in clinic device interrogation personally reviewed Battery longevity estimated at 5 years.  Ventricular pacing 2.9% Atrial pacing 40.8% Reprogrammed to AAI-DDD Reprogrammed base rate to 40 bpm With atrial pacing at 90 bpm there is intact one-to-one AV conduction  EKG:  The ekg ordered today demonstrates a paced, V sensed  Recent Labs: 04/25/2022: ALT 21; BUN 11; Creatinine, Ser 0.92; Potassium 3.6; Sodium 138 06/25/2022: Hemoglobin 15.6; Platelets 232  Recent Lipid Panel    Component Value Date/Time   CHOL 149 04/26/2022 0513   CHOL 124 10/16/2014 0405   TRIG 131  04/26/2022 0513   TRIG 158 10/16/2014 0405   HDL 37 (L) 04/26/2022 0513   HDL 35 (L) 10/16/2014 0405   CHOLHDL 4.0 04/26/2022 0513   VLDL 26 04/26/2022 0513   VLDL 32 10/16/2014 0405   LDLCALC 86 04/26/2022 0513   LDLCALC 57 10/16/2014 0405    Physical Exam:    VS:  There were no vitals taken for this visit.    Wt Readings from Last 3 Encounters:  06/25/22 156 lb (70.8 kg)  06/04/22 167 lb (75.8 kg)  04/25/22 167 lb 8.8 oz (76 kg)     GEN:  Well nourished, well developed in no acute distress HEENT: Normal NECK: No JVD; No carotid bruits LYMPHATICS: No lymphadenopathy CARDIAC: RRR, no murmurs, rubs, gallops RESPIRATORY:  Clear to auscultation without rales, wheezing or rhonchi  ABDOMEN: Soft, non-tender,  non-distended MUSCULOSKELETAL:  No edema; No deformity  SKIN: Warm and dry NEUROLOGIC:  Alert and oriented x 3 PSYCHIATRIC:  Normal affect   ASSESSMENT:    1. Cardiac pacemaker   2. Failure of pacemaker lead, subsequent encounter     PLAN:    In order of problems listed above:  1. 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  The right ventricular lead is within the middle cardiac vein.  The right ventricular lead does not reliably function and intermittently captures the atrial tissue.  His device has been reprogrammed to atrial pacing only with a base rate of 40.    3. Failure of pacemaker lead, initial encounter See #1    Follow-up 1 year or sooner as needed  Medication Adjustments/Labs and Tests Ordered: Current medicines are reviewed at length with the patient today.  Concerns regarding medicines are outlined above.  No orders of the defined types were placed in this encounter.  No orders of the defined types were placed in this encounter.     Signed, Lars Mage, MD, [facility], [facility] 07/30/2022 2:34 PM    Electrophysiology [facility]

## 2022-07-31 ENCOUNTER — Ambulatory Visit: Payer: Medicare HMO | Attending: Cardiology | Admitting: Cardiology

## 2022-07-31 DIAGNOSIS — T82110D Breakdown (mechanical) of cardiac electrode, subsequent encounter: Secondary | ICD-10-CM

## 2022-07-31 DIAGNOSIS — Z950 Presence of cardiac pacemaker: Secondary | ICD-10-CM

## 2022-08-01 ENCOUNTER — Encounter: Payer: Self-pay | Admitting: Cardiology

## 2022-08-06 ENCOUNTER — Other Ambulatory Visit: Payer: Self-pay | Admitting: Infectious Diseases

## 2022-08-06 ENCOUNTER — Other Ambulatory Visit (HOSPITAL_COMMUNITY): Payer: Self-pay

## 2022-08-06 DIAGNOSIS — B20 Human immunodeficiency virus [HIV] disease: Secondary | ICD-10-CM

## 2022-08-07 ENCOUNTER — Other Ambulatory Visit (HOSPITAL_COMMUNITY): Payer: Self-pay

## 2022-08-07 MED ORDER — TRIUMEQ 600-50-300 MG PO TABS
1.0000 | ORAL_TABLET | Freq: Every day | ORAL | 5 refills | Status: DC
  Filled 2022-08-07: qty 30, 30d supply, fill #0
  Filled 2022-09-05: qty 30, 30d supply, fill #1
  Filled 2022-10-02: qty 30, 30d supply, fill #2
  Filled 2022-10-30: qty 30, 30d supply, fill #3
  Filled 2022-12-02: qty 30, 30d supply, fill #4
  Filled 2022-12-26: qty 30, 30d supply, fill #5

## 2022-08-14 ENCOUNTER — Other Ambulatory Visit (HOSPITAL_COMMUNITY): Payer: Self-pay

## 2022-09-04 ENCOUNTER — Other Ambulatory Visit (HOSPITAL_COMMUNITY): Payer: Self-pay

## 2022-09-05 ENCOUNTER — Other Ambulatory Visit (HOSPITAL_COMMUNITY): Payer: Self-pay

## 2022-09-06 ENCOUNTER — Other Ambulatory Visit (HOSPITAL_COMMUNITY): Payer: Self-pay

## 2022-09-11 ENCOUNTER — Other Ambulatory Visit (HOSPITAL_COMMUNITY): Payer: Self-pay

## 2022-09-26 ENCOUNTER — Ambulatory Visit: Payer: Medicare HMO | Admitting: Infectious Diseases

## 2022-10-02 ENCOUNTER — Other Ambulatory Visit (HOSPITAL_COMMUNITY): Payer: Self-pay

## 2022-10-07 ENCOUNTER — Other Ambulatory Visit: Payer: Self-pay

## 2022-10-30 ENCOUNTER — Other Ambulatory Visit (HOSPITAL_COMMUNITY): Payer: Self-pay

## 2022-11-06 ENCOUNTER — Other Ambulatory Visit: Payer: Self-pay

## 2022-11-06 ENCOUNTER — Other Ambulatory Visit (HOSPITAL_COMMUNITY): Payer: Self-pay

## 2022-11-29 ENCOUNTER — Other Ambulatory Visit (HOSPITAL_COMMUNITY): Payer: Self-pay

## 2022-12-02 ENCOUNTER — Other Ambulatory Visit: Payer: Self-pay

## 2022-12-02 ENCOUNTER — Other Ambulatory Visit (HOSPITAL_COMMUNITY): Payer: Self-pay

## 2022-12-23 ENCOUNTER — Other Ambulatory Visit: Payer: Self-pay

## 2022-12-25 ENCOUNTER — Other Ambulatory Visit (HOSPITAL_COMMUNITY): Payer: Self-pay

## 2022-12-26 ENCOUNTER — Other Ambulatory Visit (HOSPITAL_COMMUNITY): Payer: Self-pay

## 2022-12-27 ENCOUNTER — Other Ambulatory Visit (HOSPITAL_COMMUNITY): Payer: Self-pay

## 2023-01-22 ENCOUNTER — Other Ambulatory Visit (HOSPITAL_COMMUNITY): Payer: Self-pay

## 2023-01-22 ENCOUNTER — Other Ambulatory Visit: Payer: Self-pay | Admitting: Infectious Diseases

## 2023-01-22 DIAGNOSIS — B20 Human immunodeficiency virus [HIV] disease: Secondary | ICD-10-CM

## 2023-01-22 MED ORDER — TRIUMEQ 600-50-300 MG PO TABS
1.0000 | ORAL_TABLET | Freq: Every day | ORAL | 5 refills | Status: DC
  Filled 2023-01-22: qty 30, 30d supply, fill #0
  Filled 2023-02-27: qty 30, 30d supply, fill #1
  Filled 2023-03-27 – 2023-03-28 (×2): qty 30, 30d supply, fill #2
  Filled 2023-04-24: qty 30, 30d supply, fill #3
  Filled 2023-05-06 – 2023-05-23 (×2): qty 30, 30d supply, fill #4
  Filled 2023-06-23: qty 30, 30d supply, fill #5

## 2023-01-23 ENCOUNTER — Other Ambulatory Visit (HOSPITAL_COMMUNITY): Payer: Self-pay

## 2023-01-23 ENCOUNTER — Other Ambulatory Visit: Payer: Self-pay

## 2023-01-24 ENCOUNTER — Other Ambulatory Visit: Payer: Self-pay

## 2023-02-01 IMAGING — CT CT CERVICAL SPINE W/O CM
3 of 4 series · 12 of 33 positions shown, 14 images · non-contrast
Comparison: None.

CLINICAL DATA: Neck and back pain

EXAM:
CT CERVICAL SPINE WITHOUT CONTRAST
CT THORACIC SPINE WITHOUT CONTRAST
TECHNIQUE: Multidetector CT imaging of the cervical and thoracic spine was
performed without contrast. Multiplanar CT image reconstructions
were also generated.

[Series 4: sagittal bone · sagittal · 0.38mm/px · 5 of 87 slices shown, 6 images]
[im 29/87  bone]
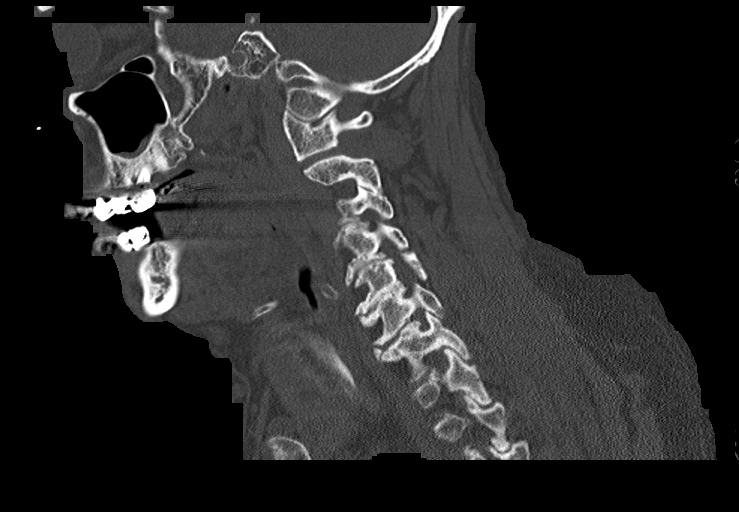
[im 36/87  bone]
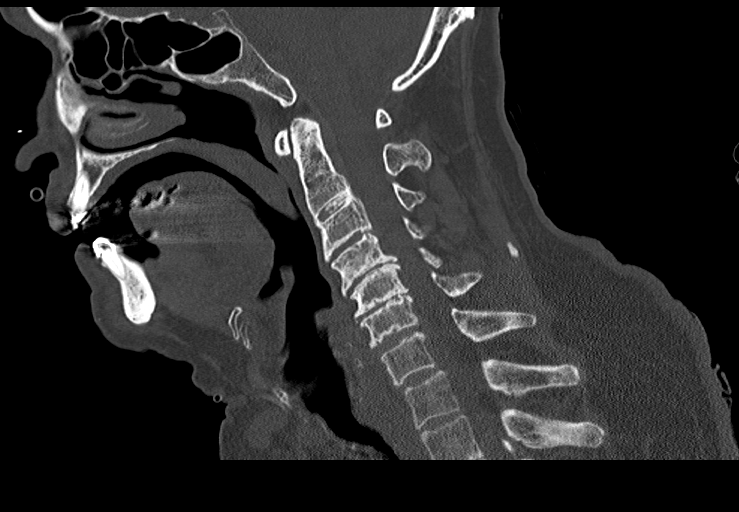
[im 44/87  soft-tissue]
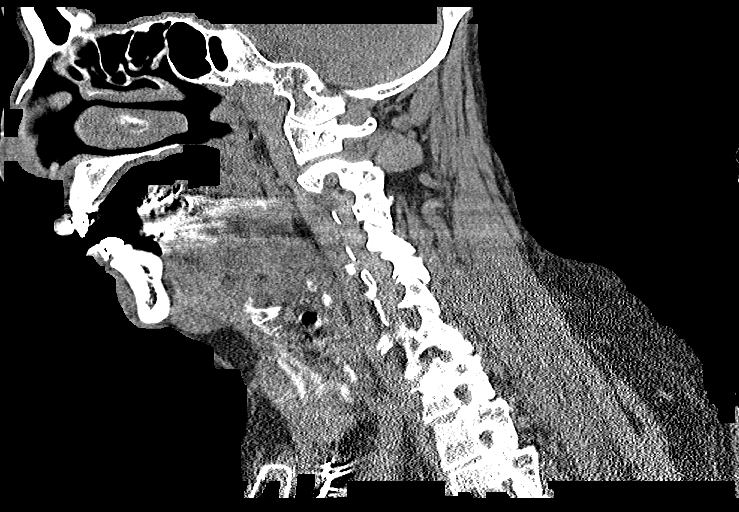
[im 44/87  bone]
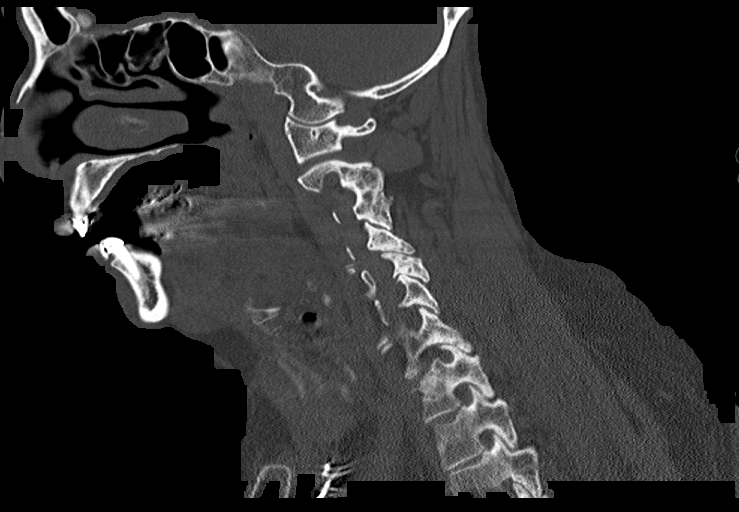
[im 51/87  bone]
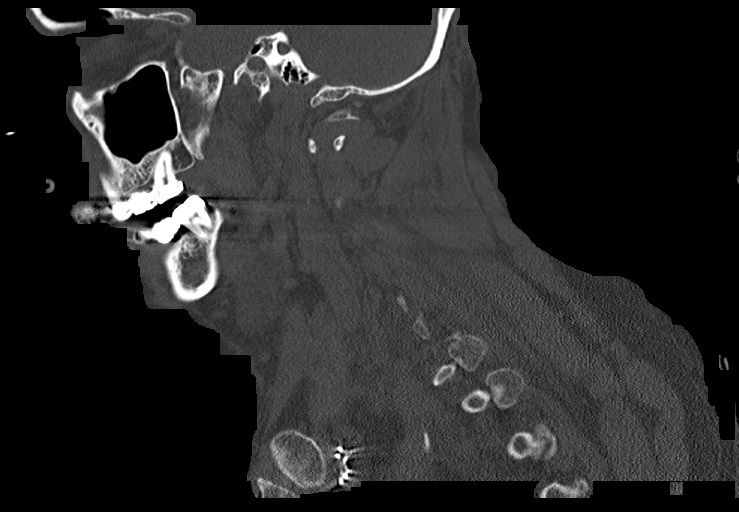
[im 58/87  bone]
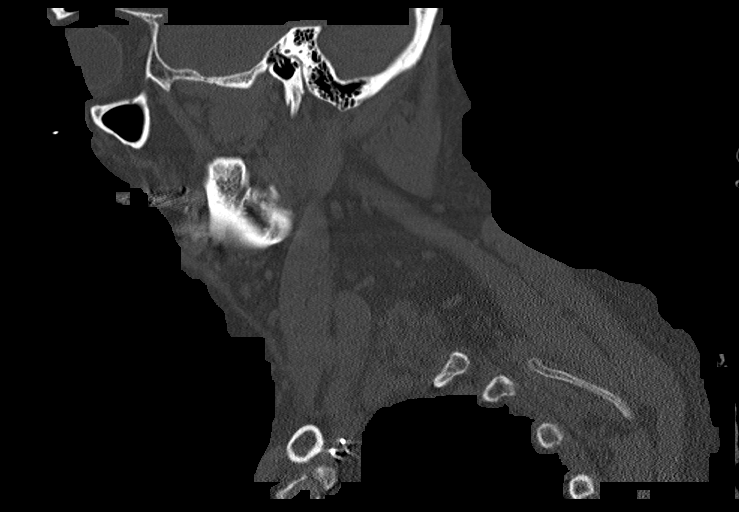

[Series 5: coronal bone · coronal · 0.45mm/px · 3 of 110 slices shown]
[im 35/110  bone]
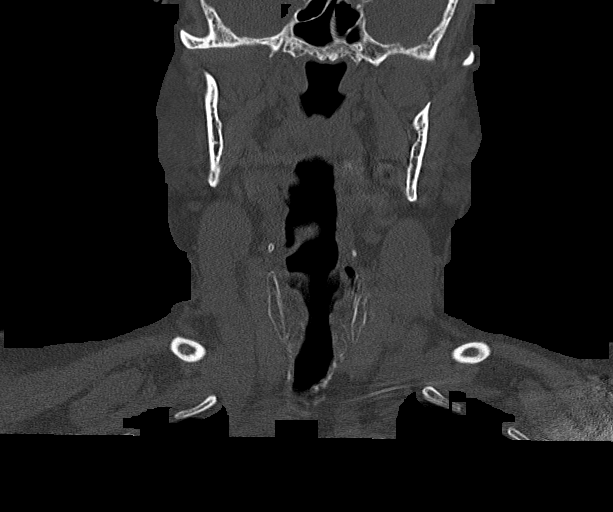
[im 48/110  bone]
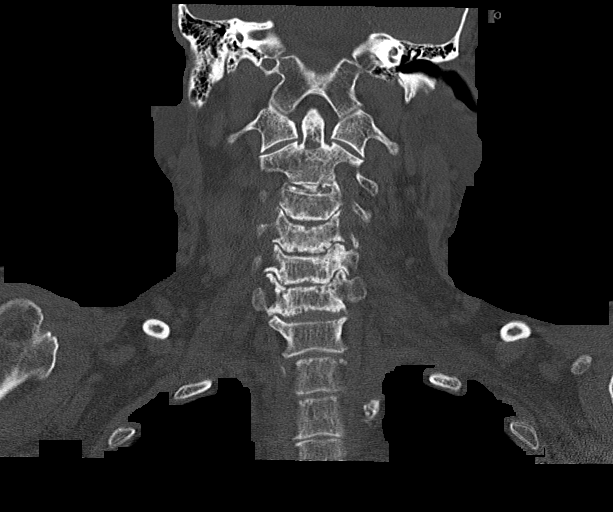
[im 62/110  bone]
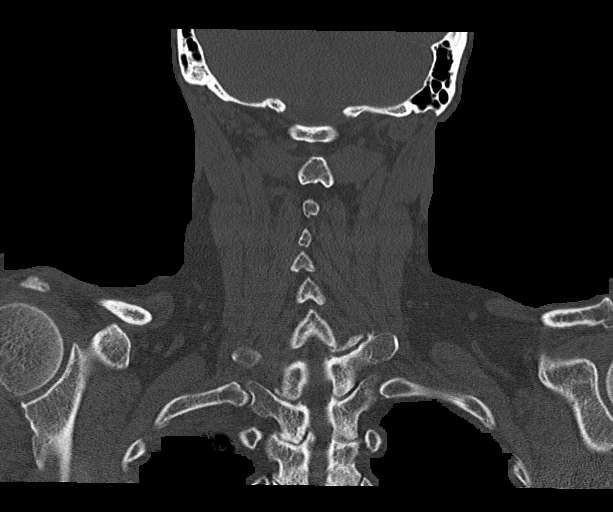

[Series 6: orthogonal bone · axial · 0.41mm/px · z∈[+161,+311]mm · 4 of 120 slices shown, 5 images]
[im 18/120  soft-tissue]
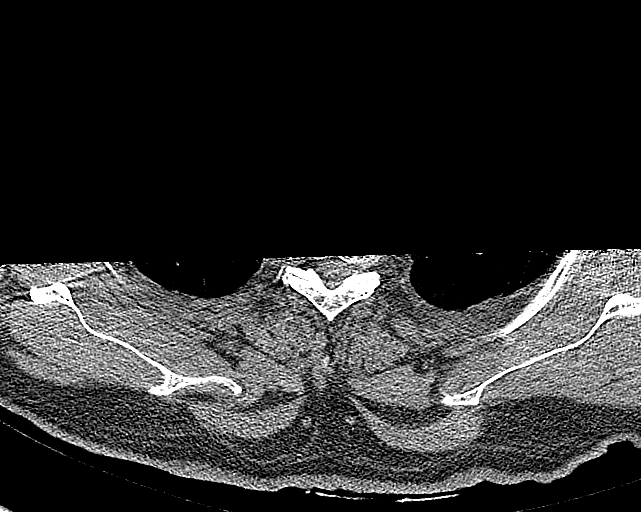
[im 18/120  bone]
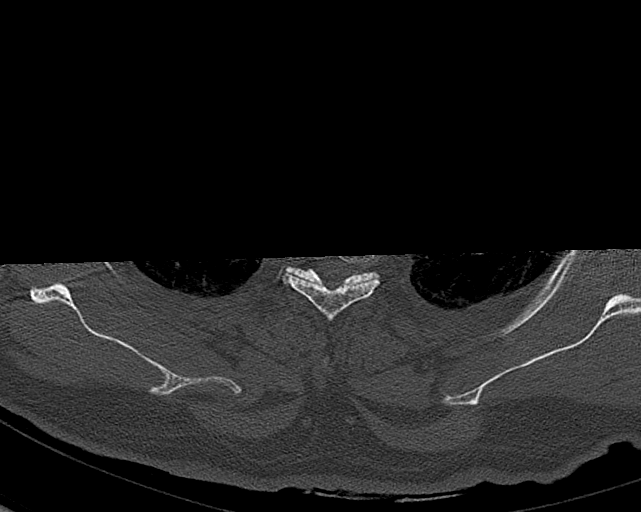
[im 52/120  bone]
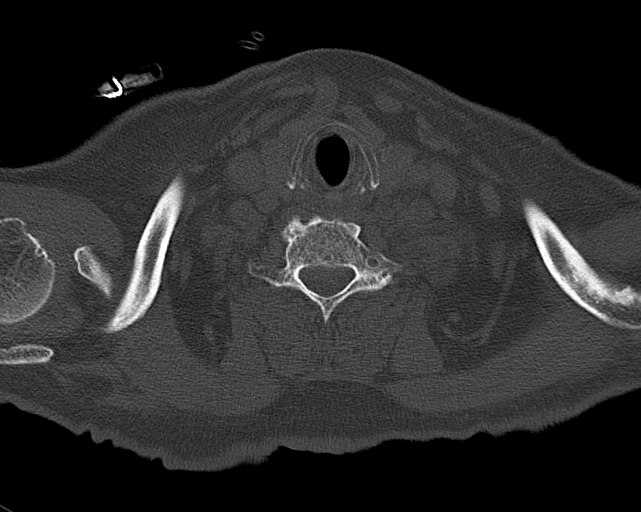
[im 69/120  bone]
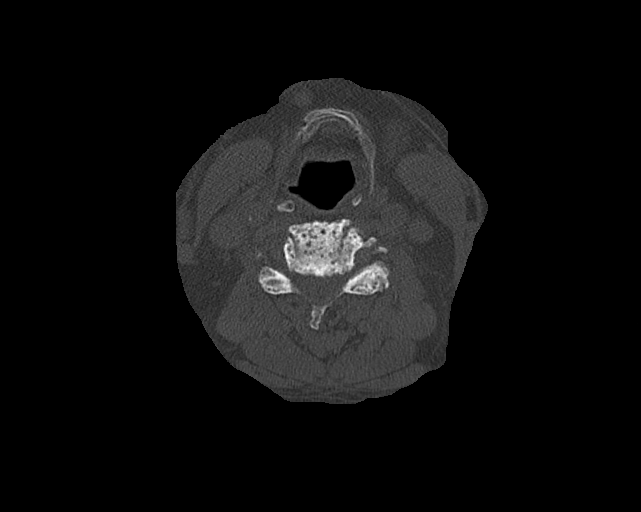
[im 103/120  bone]
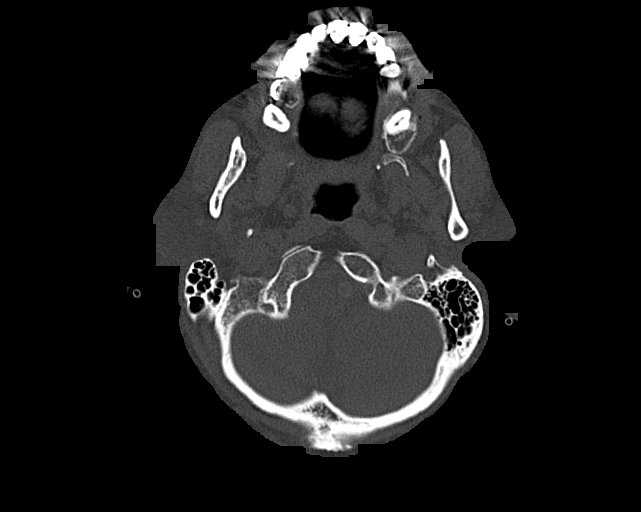

[12 of 33 positions shown; findings below may reference images not displayed]

FINDINGS: CT CERVICAL SPINE FINDINGS

Alignment: Normal.

Skull base and vertebrae: No acute fracture. No primary bone lesion
or focal pathologic process.

Soft tissues and spinal canal: No prevertebral fluid or swelling. No
visible canal hematoma.

Disc levels: Uncovertebral osteophytes at the C4-5 and C5-6 levels.
No high-grade spinal canal stenosis.

Upper chest: Negative.

Other: None.

CT THORACIC SPINE FINDINGS

Alignment: Normal

Vertebrae: No acute fracture or focal pathologic process.

Paraspinal and other soft tissues: Multifocal atelectasis and
calcific aortic atherosclerosis. Enlarged caudate lobe of the liver
comment is visualized.

Disc levels:
IMPRESSION: 1. No acute fracture or static subluxation of the cervical or
thoracic spine

Aortic Atherosclerosis (9YIO5-A9V.V).

## 2023-02-01 IMAGING — CT CT T SPINE W/O CM
3 of 4 series · 12 of 33 positions shown, 14 images · non-contrast
Comparison: None.

CLINICAL DATA: Neck and back pain

EXAM:
CT CERVICAL SPINE WITHOUT CONTRAST
CT THORACIC SPINE WITHOUT CONTRAST
TECHNIQUE: Multidetector CT imaging of the cervical and thoracic spine was
performed without contrast. Multiplanar CT image reconstructions
were also generated.

[Series 6: sagittal bone · sagittal · 0.45mm/px · 5 of 106 slices shown, 6 images]
[im 36/106  bone]
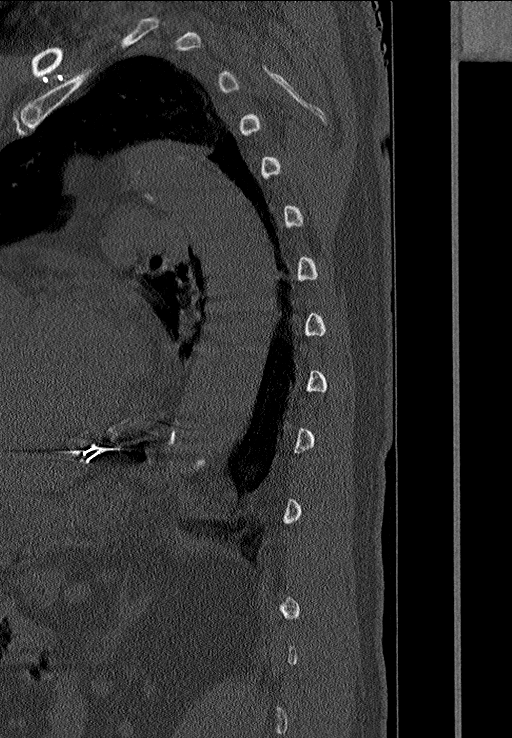
[im 44/106  bone]
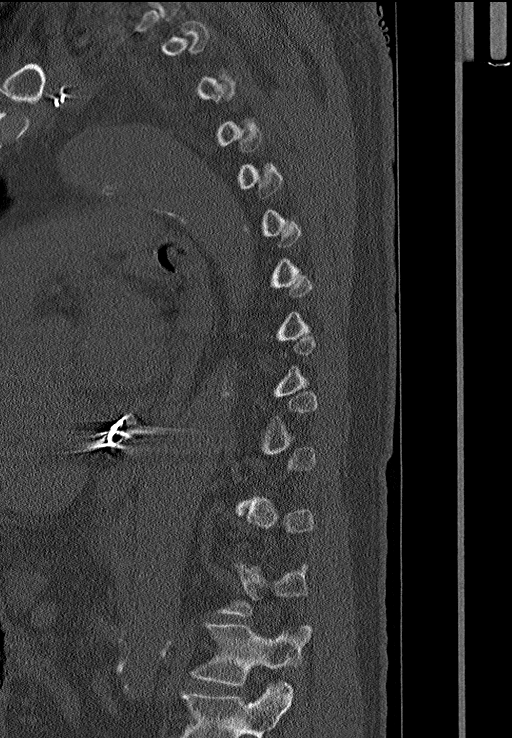
[im 53/106  soft-tissue]
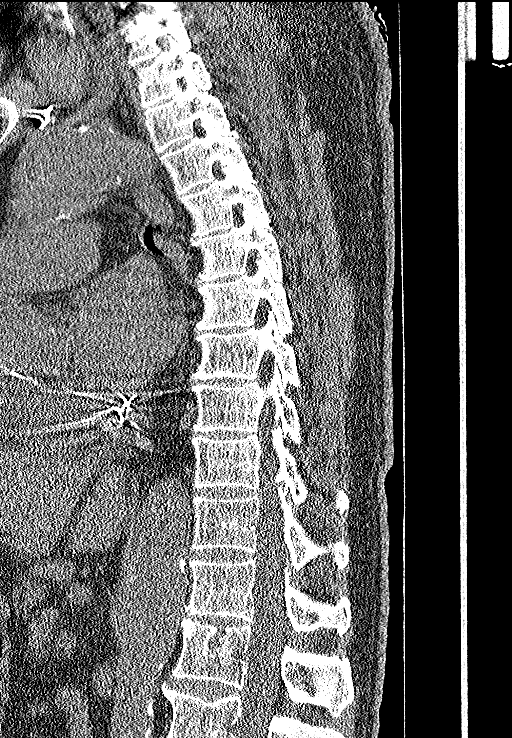
[im 53/106  bone]
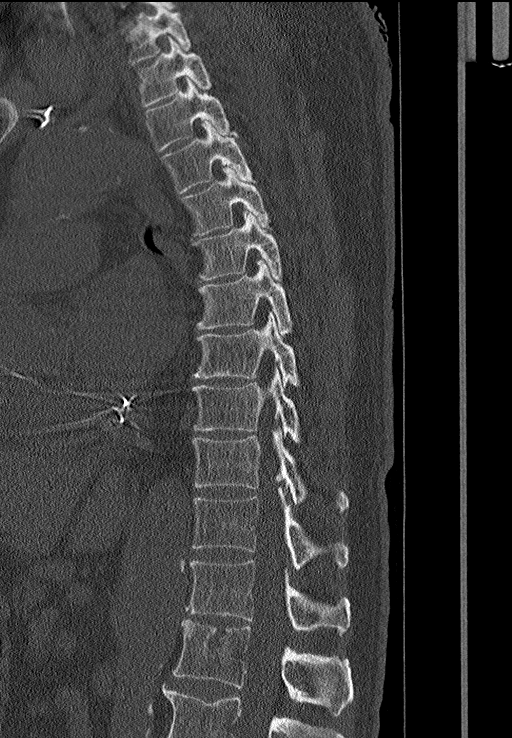
[im 62/106  bone]
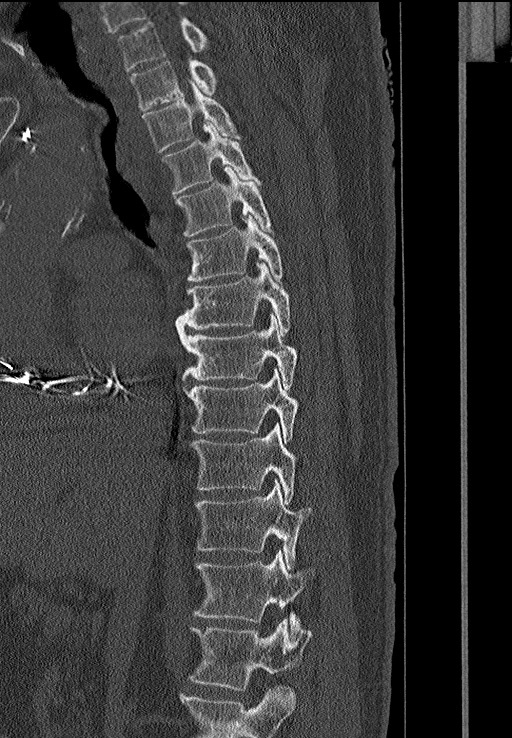
[im 71/106  bone]
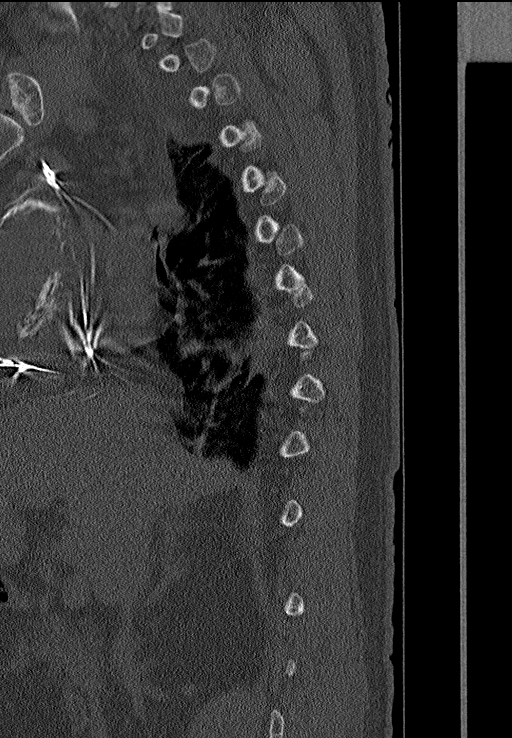

[Series 7: coronal bone · coronal · 0.36mm/px · 3 of 90 slices shown]
[im 18/90  bone]
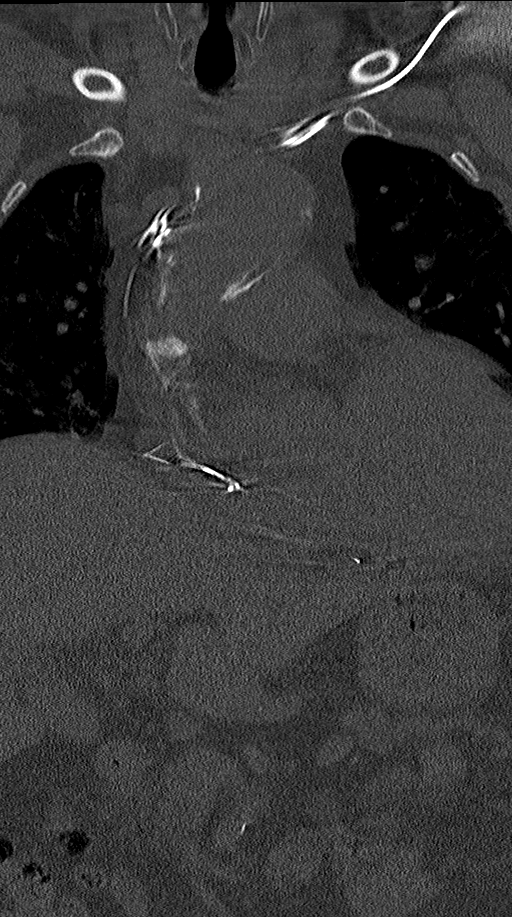
[im 36/90  bone]
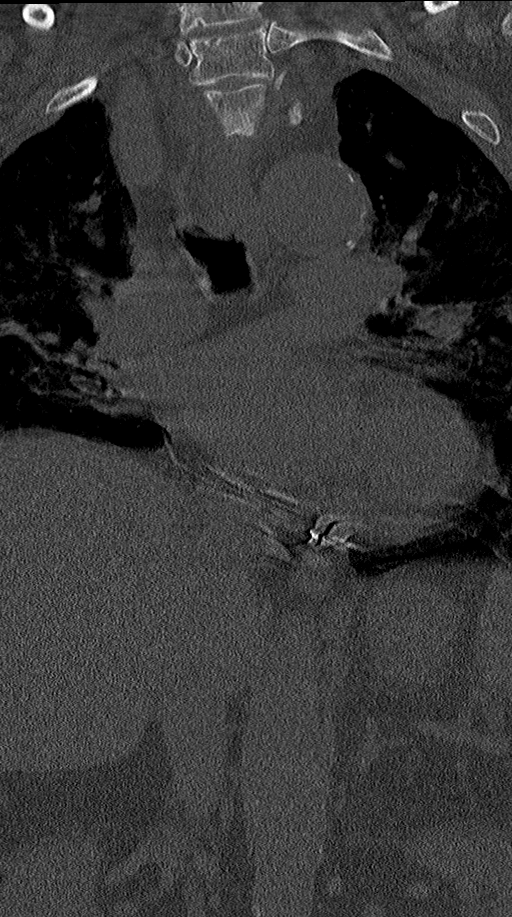
[im 54/90  bone]
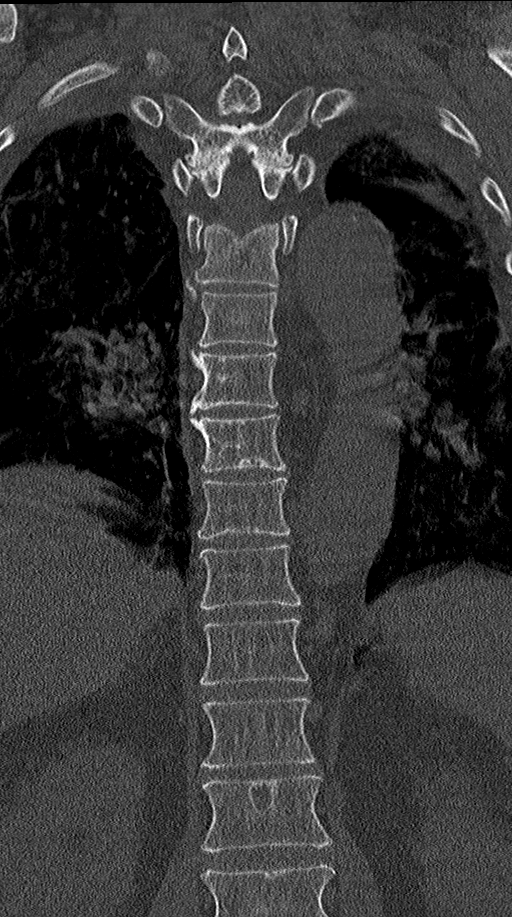

[Series 9: multidisc · axial · 0.21mm/px · z∈[-5,+206]mm · 4 of 166 slices shown, 5 images]
[im 28/166  soft-tissue]
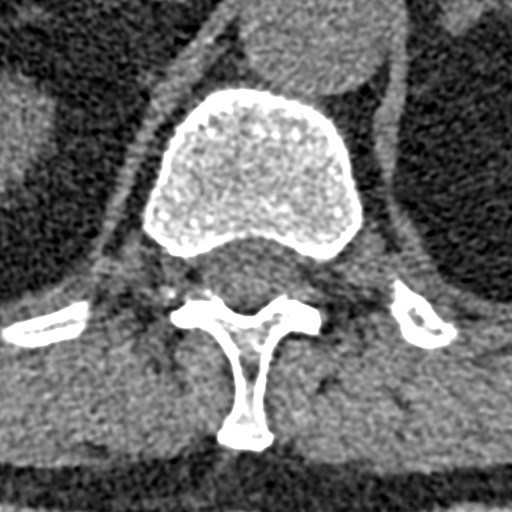
[im 28/166  bone]
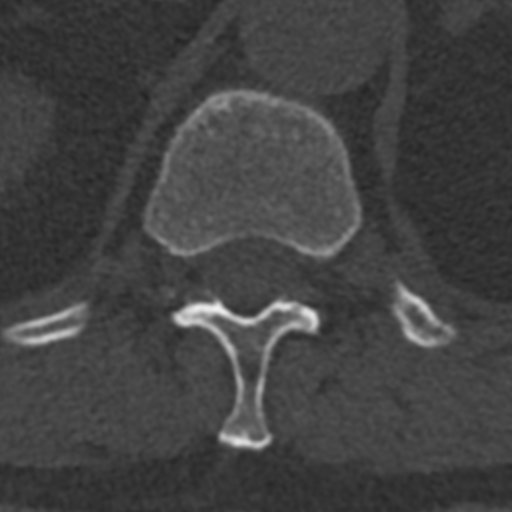
[im 56/166  bone]
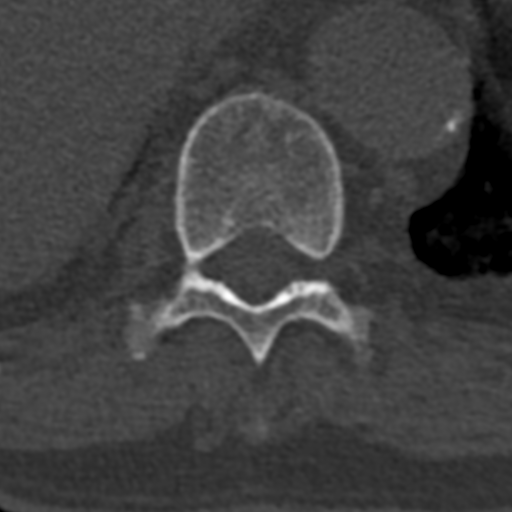
[im 111/166  bone]
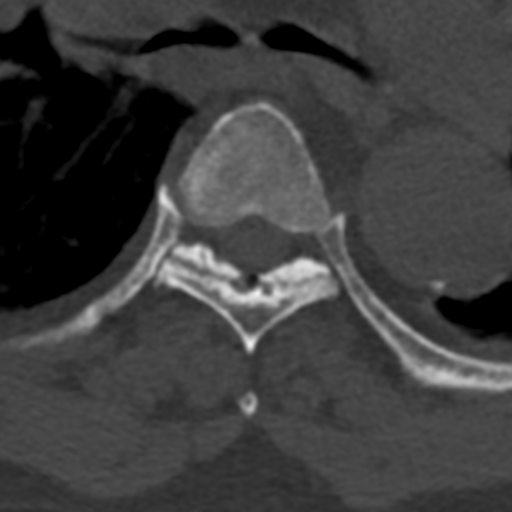
[im 138/166  bone]
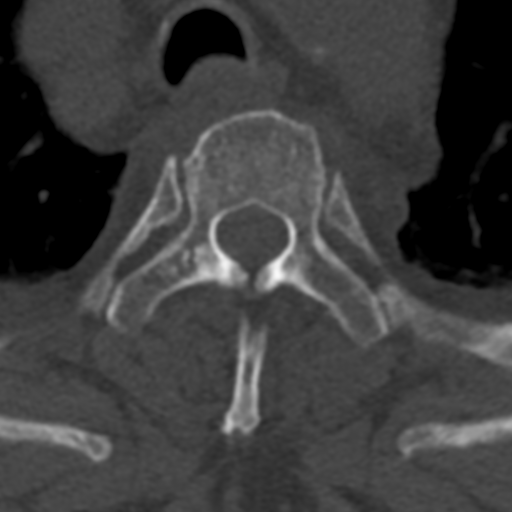

[12 of 33 positions shown; findings below may reference images not displayed]

FINDINGS: CT CERVICAL SPINE FINDINGS

Alignment: Normal.

Skull base and vertebrae: No acute fracture. No primary bone lesion
or focal pathologic process.

Soft tissues and spinal canal: No prevertebral fluid or swelling. No
visible canal hematoma.

Disc levels: Uncovertebral osteophytes at the C4-5 and C5-6 levels.
No high-grade spinal canal stenosis.

Upper chest: Negative.

Other: None.

CT THORACIC SPINE FINDINGS

Alignment: Normal

Vertebrae: No acute fracture or focal pathologic process.

Paraspinal and other soft tissues: Multifocal atelectasis and
calcific aortic atherosclerosis. Enlarged caudate lobe of the liver
comment is visualized.

Disc levels:
IMPRESSION: 1. No acute fracture or static subluxation of the cervical or
thoracic spine

Aortic Atherosclerosis (9YIO5-A9V.V).

## 2023-02-01 IMAGING — US US EXTREM LOW VENOUS*R*
1 series · 14 of 24 positions shown · non-contrast
Comparison: CTA CAP, 07/13/2021.

CLINICAL DATA: RIGHT leg pain.

EXAM:
RIGHT LOWER EXTREMITY VENOUS DOPPLER ULTRASOUND
TECHNIQUE: Gray-scale sonography with compression, as well as color and duplex
ultrasound, were performed to evaluate the deep venous system(s)
from the level of the common femoral vein through the popliteal and
proximal calf veins.

[Series 1: us venous img lower uni right (dvt) · portal-venous · 37 acquisitions, 14 frames shown]
[im 1/37]
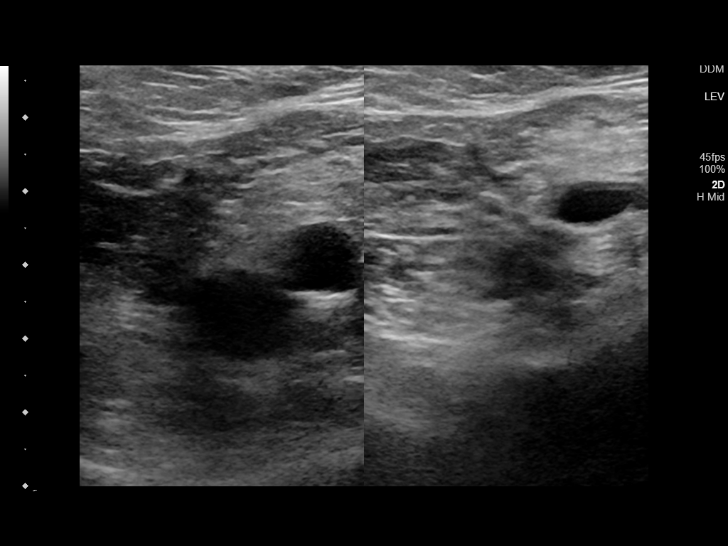
[im 4/37]
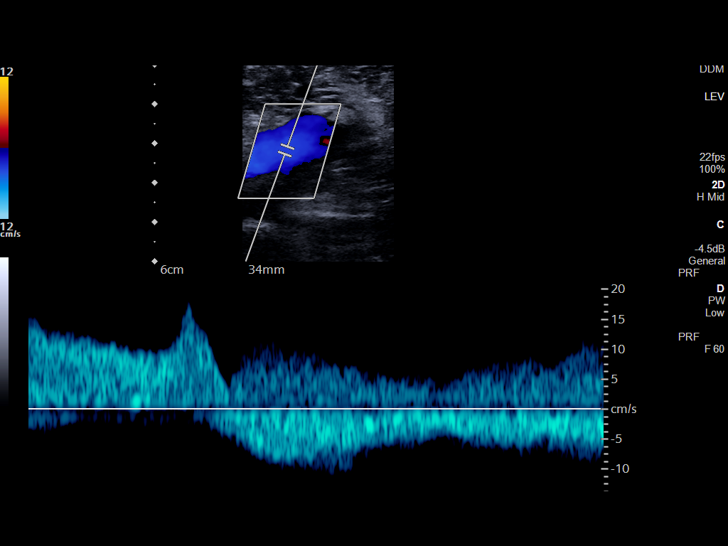
[im 7/37]
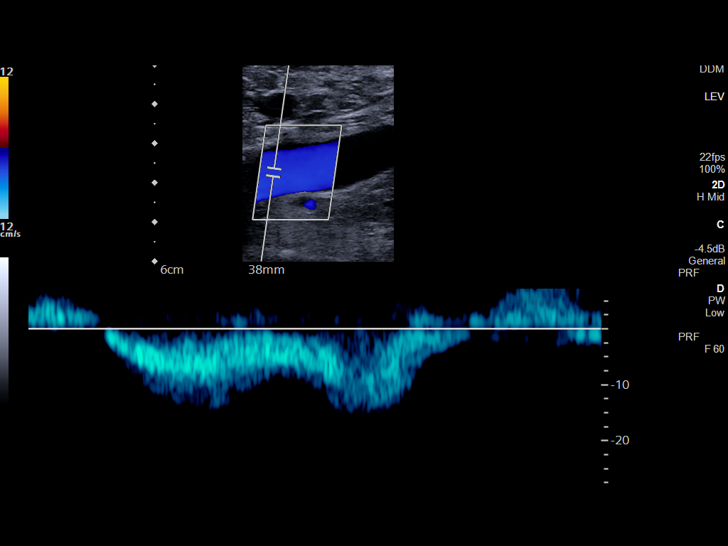
[im 10/37]
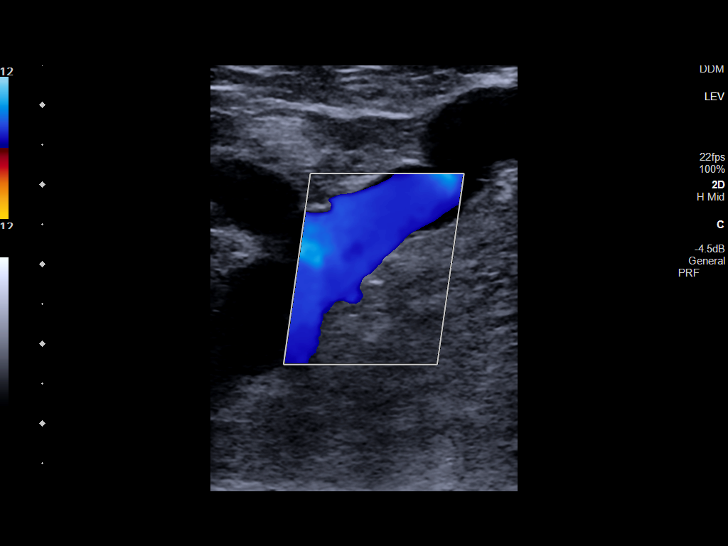
[im 11/37]
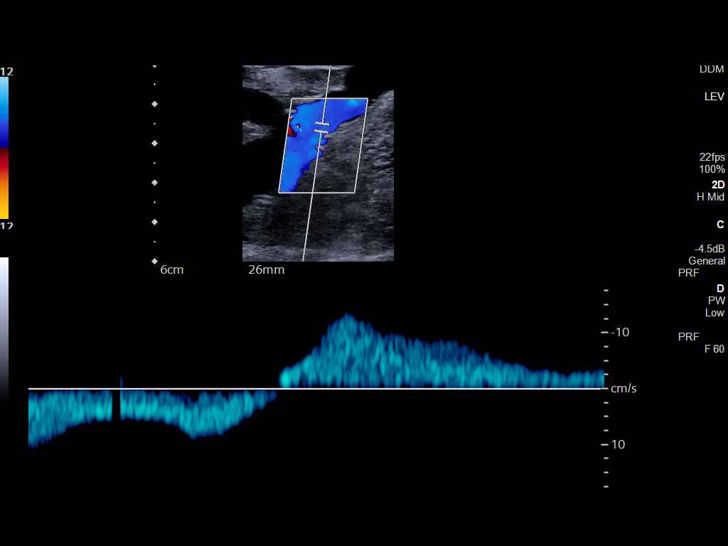
[im 15/37]
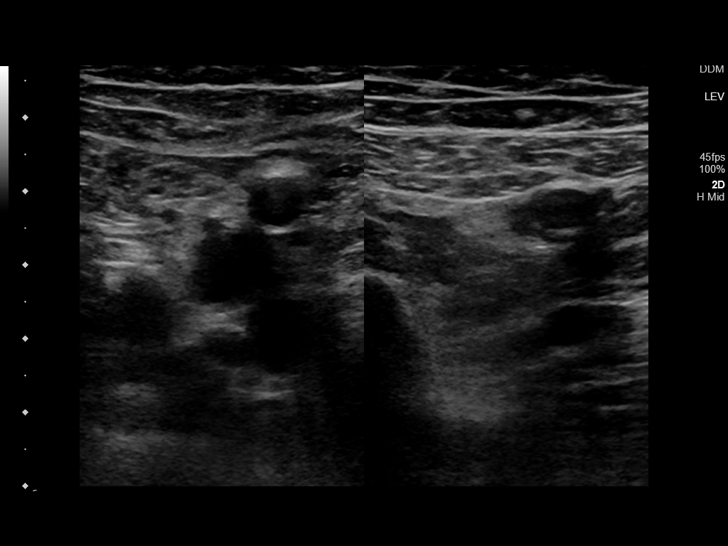
[im 18/37]
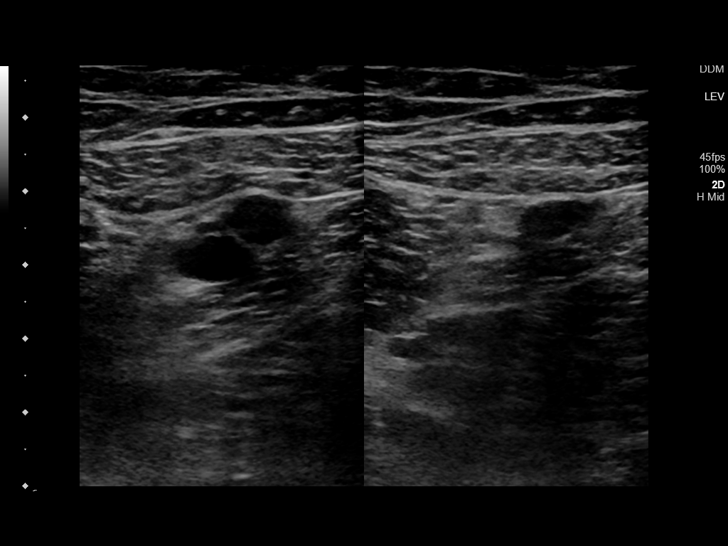
[im 21/37]
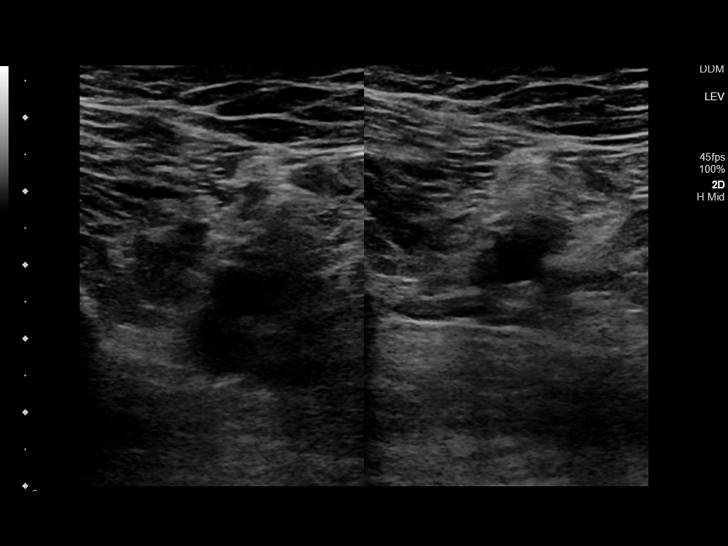
[im 24/37]
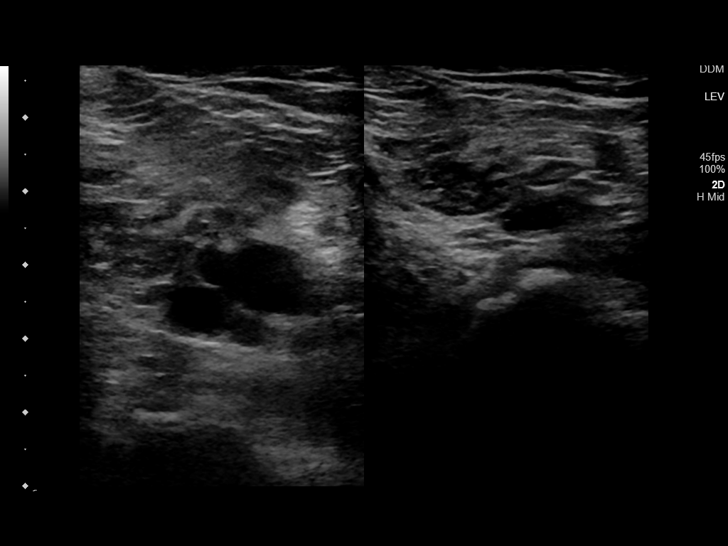
[im 27/37]
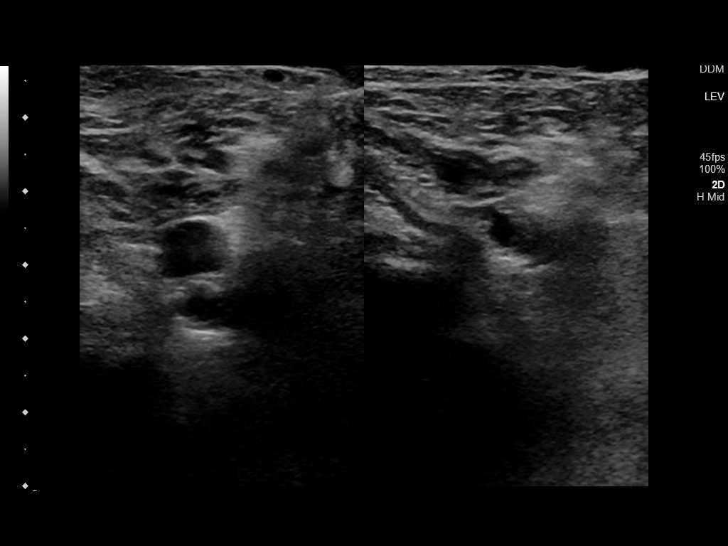
[im 30/37]
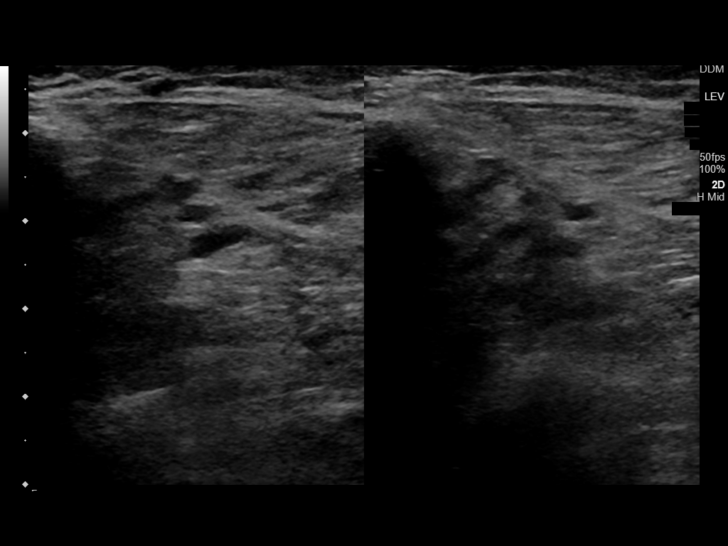
[im 32/37]
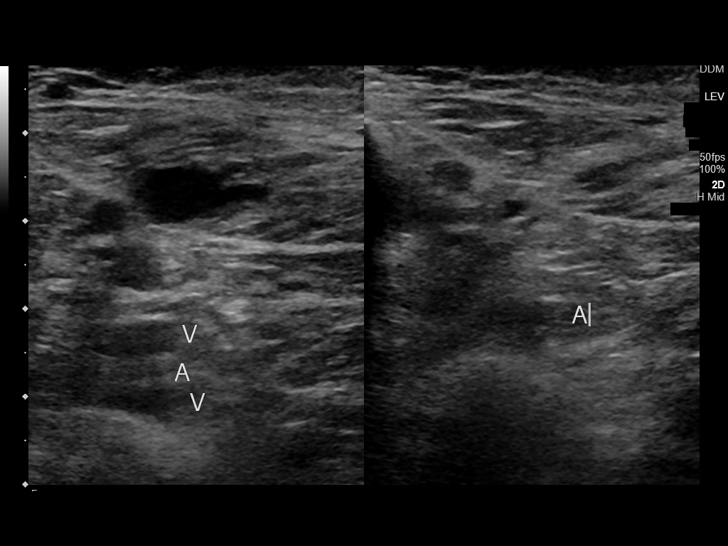
[im 35/37]
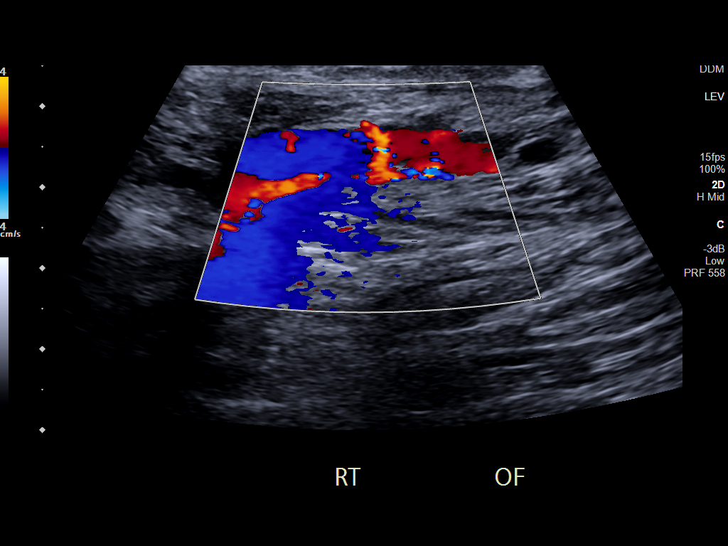
[im 37/37]
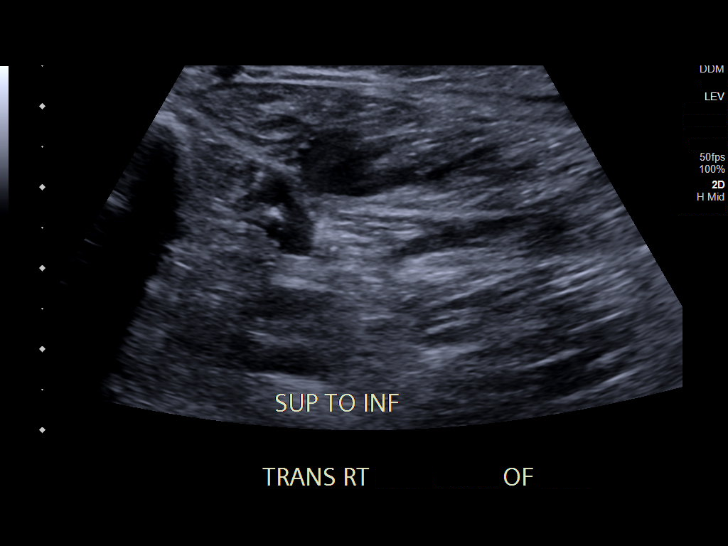

[14 of 24 positions shown; findings below may reference images not displayed]

FINDINGS: VENOUS

Normal compressibility of the common femoral, superficial femoral,
and popliteal veins, as well as the visualized calf veins.
Visualized portions of profunda femoral vein and great saphenous
vein unremarkable. No filling defects to suggest DVT on grayscale or
color Doppler imaging. Doppler waveforms show normal direction of
venous flow, normal respiratory plasticity and response to
augmentation.

Limited views of the contralateral common femoral vein are
unremarkable.

OTHER

No evidence of superficial thrombophlebitis or abnormal fluid
collection.

Limitations: none
IMPRESSION: No evidence of femoropopliteal DVT within the RIGHT lower extremity.

## 2023-02-20 ENCOUNTER — Other Ambulatory Visit (HOSPITAL_COMMUNITY): Payer: Self-pay

## 2023-02-27 ENCOUNTER — Other Ambulatory Visit (HOSPITAL_COMMUNITY): Payer: Self-pay

## 2023-02-28 ENCOUNTER — Other Ambulatory Visit (HOSPITAL_COMMUNITY): Payer: Self-pay

## 2023-03-27 ENCOUNTER — Other Ambulatory Visit (HOSPITAL_COMMUNITY): Payer: Self-pay

## 2023-03-28 ENCOUNTER — Other Ambulatory Visit (HOSPITAL_COMMUNITY): Payer: Self-pay

## 2023-03-28 ENCOUNTER — Other Ambulatory Visit: Payer: Self-pay

## 2023-03-31 ENCOUNTER — Other Ambulatory Visit (HOSPITAL_COMMUNITY): Payer: Self-pay

## 2023-04-24 ENCOUNTER — Other Ambulatory Visit (HOSPITAL_COMMUNITY): Payer: Self-pay

## 2023-04-24 ENCOUNTER — Other Ambulatory Visit: Payer: Self-pay

## 2023-05-06 ENCOUNTER — Other Ambulatory Visit (HOSPITAL_COMMUNITY): Payer: Self-pay

## 2023-05-21 ENCOUNTER — Other Ambulatory Visit (HOSPITAL_COMMUNITY): Payer: Self-pay

## 2023-05-23 ENCOUNTER — Other Ambulatory Visit (HOSPITAL_COMMUNITY): Payer: Self-pay

## 2023-05-23 ENCOUNTER — Other Ambulatory Visit: Payer: Self-pay

## 2023-05-27 ENCOUNTER — Other Ambulatory Visit: Payer: Self-pay

## 2023-05-28 ENCOUNTER — Other Ambulatory Visit: Payer: Self-pay

## 2023-06-18 ENCOUNTER — Other Ambulatory Visit: Payer: Self-pay

## 2023-06-20 ENCOUNTER — Other Ambulatory Visit: Payer: Self-pay

## 2023-06-23 ENCOUNTER — Encounter (HOSPITAL_COMMUNITY): Payer: Self-pay

## 2023-06-23 ENCOUNTER — Other Ambulatory Visit (HOSPITAL_COMMUNITY): Payer: Self-pay

## 2023-06-24 ENCOUNTER — Other Ambulatory Visit (HOSPITAL_COMMUNITY): Payer: Self-pay

## 2023-07-15 ENCOUNTER — Other Ambulatory Visit (HOSPITAL_COMMUNITY): Payer: Self-pay

## 2023-07-16 ENCOUNTER — Emergency Department: Payer: Medicare HMO

## 2023-07-16 ENCOUNTER — Inpatient Hospital Stay
Admission: EM | Admit: 2023-07-16 | Discharge: 2023-07-21 | DRG: 871 | Disposition: A | Discharge status: Home Health | Payer: Medicare HMO | Attending: Internal Medicine | Admitting: Internal Medicine

## 2023-07-16 ENCOUNTER — Other Ambulatory Visit: Payer: Self-pay

## 2023-07-16 DIAGNOSIS — I5A Non-ischemic myocardial injury (non-traumatic): Secondary | ICD-10-CM | POA: Diagnosis present

## 2023-07-16 DIAGNOSIS — Z66 Do not resuscitate: Secondary | ICD-10-CM | POA: Diagnosis present

## 2023-07-16 DIAGNOSIS — A4189 Other specified sepsis: Principal | ICD-10-CM | POA: Diagnosis present

## 2023-07-16 DIAGNOSIS — R001 Bradycardia, unspecified: Secondary | ICD-10-CM | POA: Diagnosis present

## 2023-07-16 DIAGNOSIS — J4489 Other specified chronic obstructive pulmonary disease: Secondary | ICD-10-CM | POA: Diagnosis present

## 2023-07-16 DIAGNOSIS — I110 Hypertensive heart disease with heart failure: Secondary | ICD-10-CM | POA: Diagnosis present

## 2023-07-16 DIAGNOSIS — I5032 Chronic diastolic (congestive) heart failure: Secondary | ICD-10-CM | POA: Diagnosis present

## 2023-07-16 DIAGNOSIS — Z950 Presence of cardiac pacemaker: Secondary | ICD-10-CM

## 2023-07-16 DIAGNOSIS — I10 Essential (primary) hypertension: Secondary | ICD-10-CM | POA: Diagnosis present

## 2023-07-16 DIAGNOSIS — J449 Chronic obstructive pulmonary disease, unspecified: Secondary | ICD-10-CM | POA: Diagnosis present

## 2023-07-16 DIAGNOSIS — Z8673 Personal history of transient ischemic attack (TIA), and cerebral infarction without residual deficits: Secondary | ICD-10-CM | POA: Diagnosis present

## 2023-07-16 DIAGNOSIS — A419 Sepsis, unspecified organism: Secondary | ICD-10-CM | POA: Diagnosis present

## 2023-07-16 DIAGNOSIS — F32A Depression, unspecified: Secondary | ICD-10-CM | POA: Diagnosis present

## 2023-07-16 DIAGNOSIS — Z21 Asymptomatic human immunodeficiency virus [HIV] infection status: Secondary | ICD-10-CM | POA: Diagnosis present

## 2023-07-16 DIAGNOSIS — Z8249 Family history of ischemic heart disease and other diseases of the circulatory system: Secondary | ICD-10-CM

## 2023-07-16 DIAGNOSIS — W19XXXA Unspecified fall, initial encounter: Secondary | ICD-10-CM | POA: Diagnosis present

## 2023-07-16 DIAGNOSIS — Z79899 Other long term (current) drug therapy: Secondary | ICD-10-CM

## 2023-07-16 DIAGNOSIS — B20 Human immunodeficiency virus [HIV] disease: Secondary | ICD-10-CM | POA: Diagnosis present

## 2023-07-16 DIAGNOSIS — R531 Weakness: Principal | ICD-10-CM | POA: Diagnosis present

## 2023-07-16 DIAGNOSIS — N400 Enlarged prostate without lower urinary tract symptoms: Secondary | ICD-10-CM | POA: Diagnosis present

## 2023-07-16 DIAGNOSIS — E785 Hyperlipidemia, unspecified: Secondary | ICD-10-CM | POA: Diagnosis present

## 2023-07-16 DIAGNOSIS — I639 Cerebral infarction, unspecified: Secondary | ICD-10-CM | POA: Diagnosis present

## 2023-07-16 DIAGNOSIS — U071 COVID-19: Secondary | ICD-10-CM | POA: Diagnosis not present

## 2023-07-16 DIAGNOSIS — F419 Anxiety disorder, unspecified: Secondary | ICD-10-CM | POA: Diagnosis present

## 2023-07-16 DIAGNOSIS — Z7983 Long term (current) use of bisphosphonates: Secondary | ICD-10-CM

## 2023-07-16 DIAGNOSIS — I719 Aortic aneurysm of unspecified site, without rupture: Secondary | ICD-10-CM | POA: Diagnosis present

## 2023-07-16 DIAGNOSIS — G4733 Obstructive sleep apnea (adult) (pediatric): Secondary | ICD-10-CM | POA: Diagnosis present

## 2023-07-16 DIAGNOSIS — F418 Other specified anxiety disorders: Secondary | ICD-10-CM | POA: Diagnosis present

## 2023-07-16 DIAGNOSIS — Z7982 Long term (current) use of aspirin: Secondary | ICD-10-CM

## 2023-07-16 LAB — CBC
HCT: 44.7 % (ref 39.0–52.0)
Hemoglobin: 16.1 g/dL (ref 13.0–17.0)
MCH: 30.7 pg (ref 26.0–34.0)
MCHC: 36 g/dL (ref 30.0–36.0)
MCV: 85.1 fL (ref 80.0–100.0)
Platelets: 219 10*3/uL (ref 150–400)
RBC: 5.25 MIL/uL (ref 4.22–5.81)
RDW: 13.9 % (ref 11.5–15.5)
WBC: 12.2 10*3/uL — ABNORMAL HIGH (ref 4.0–10.5)
nRBC: 0 % (ref 0.0–0.2)

## 2023-07-16 LAB — TROPONIN I (HIGH SENSITIVITY): Troponin I (High Sensitivity): 24 ng/L — ABNORMAL HIGH (ref <18)

## 2023-07-16 LAB — COMPREHENSIVE METABOLIC PANEL WITH GFR
ALT: 14 U/L (ref 0–44)
AST: 23 U/L (ref 15–41)
Albumin: 4 g/dL (ref 3.5–5.0)
Alkaline Phosphatase: 46 U/L (ref 38–126)
Anion gap: 12 (ref 5–15)
BUN: 13 mg/dL (ref 8–23)
CO2: 23 mmol/L (ref 22–32)
Calcium: 9 mg/dL (ref 8.9–10.3)
Chloride: 100 mmol/L (ref 98–111)
Creatinine, Ser: 1.15 mg/dL (ref 0.61–1.24)
GFR, Estimated: >60 mL/min (ref >60)
Glucose, Bld: 118 mg/dL — ABNORMAL HIGH (ref 70–99)
Potassium: 3.9 mmol/L (ref 3.5–5.1)
Sodium: 135 mmol/L (ref 135–145)
Total Bilirubin: 1.3 mg/dL — ABNORMAL HIGH (ref 0.3–1.2)
Total Protein: 7.9 g/dL (ref 6.5–8.1)

## 2023-07-16 LAB — CK: Total CK: 154 U/L (ref 49–397)

## 2023-07-16 LAB — SARS CORONAVIRUS 2 BY RT PCR: SARS Coronavirus 2 by RT PCR: POSITIVE — AB

## 2023-07-16 MED ORDER — SODIUM CHLORIDE 0.9 % IV BOLUS
1000.0000 mL | Freq: Once | INTRAVENOUS | Status: AC
  Administered 2023-07-16: 1000 mL via INTRAVENOUS

## 2023-07-16 NOTE — ED Triage Notes (Signed)
Pt to ED via EMS from home, per ems pt family called for pt having increased weakness and confusion since earlier this morning. Pt c/o generalized body aches and cough.

## 2023-07-16 NOTE — ED Provider Notes (Signed)
11:10 PM  Assumed care at shift change.  Patient here for generalized weakness and unable to walk.  Normally ambulates with a walker.  Labs, urine pending.  Chest x-ray, head CT reviewed and interpreted by myself and the radiologist and show no acute abnormality.  12:15 AM  Pt's labs show looks cytosis of 12,000.  Slightly bumped troponin.  CK normal.  Patient is COVID-positive likely the cause of his generalized weakness today.  Updated family.  Will admit to the hospitalist service given patient is not able to stand and ambulate which is different than his baseline.   Meng Winterton, Layla Maw, DO 07/17/23 [id number]

## 2023-07-16 NOTE — ED Provider Notes (Signed)
[facility] Provider Note    Event Date/Time   First MD Initiated Contact with Patient 07/16/23 2226     (approximate)  History   Chief Complaint: Weakness  HPI  Lavonte Lu is a 82 y.o. male with a past medical history of arthritis, asthma, HIV, hyperlipidemia, presents to the emergency department for generalized weakness.  According to the patient and daughter-in-law he has been feeling very weak since this morning.  States he was so weak that he laid on the ground and could not get back up for 3 to 4 hours.  He normally ambulates without difficulty using a walker.  Daughter states he has never had weakness like this before.  Patient denies really any other symptoms.  Denies any chest pain or abdominal pain denies any nausea vomiting diarrhea cough congestion fever or urinary symptoms.  Patient is afebrile in the emergency department.  Mildly tachycardic.  Physical Exam   Triage Vital Signs: ED Triage Vitals  Encounter Vitals Group     BP 07/16/23 2224 (!) 145/109     Systolic BP Percentile --      Diastolic BP Percentile --      Pulse Rate 07/16/23 2224 (!) 105     Resp 07/16/23 2224 (!) 22     Temp 07/16/23 2224 98.3 F (36.8 C)     Temp Source 07/16/23 2224 Oral     SpO2 07/16/23 2224 96 %     Weight 07/16/23 2226 163 lb (73.9 kg)     Height 07/16/23 2226 5\' 7"  (1.702 m)     Head Circumference --      Peak Flow --      Pain Score 07/16/23 2223 5     Pain Loc --      Pain Education --      Exclude from Growth Chart --     Most recent vital signs: Vitals:   07/16/23 2224  BP: (!) 145/109  Pulse: (!) 105  Resp: (!) 22  Temp: 98.3 F (36.8 C)  SpO2: 96%    General: Awake, no distress.  CV:  Good peripheral perfusion.  Regular rate and rhythm around 100 bpm. Resp:  Normal effort.  Equal breath sounds bilaterally.  Abd:  No distention.  Soft, nontender.  No rebound or guarding. Other:  Patient has good grip strength bilaterally.  No  pronator drift.  In his legs he has 3+/4 strength bilaterally.  No focal deficits identified.   ED Results / Procedures / Treatments   EKG  EKG viewed and interpreted by myself shows sinus rhythm at 94 bpm with a slightly widened QRS, left axis deviation, largely normal intervals with nonspecific ST changes.  I have reviewed and interpreted CT head images.  No obvious bleed seen on my evaluation. Chest x-ray negative.  MEDICATIONS ORDERED IN ED: Medications  sodium chloride 0.9 % bolus 1,000 mL (has no administration in time range)     IMPRESSION / MDM / ASSESSMENT AND PLAN / ED COURSE  I reviewed the triage vital signs and the nursing notes.  Patient's presentation is most consistent with acute presentation with potential threat to life or bodily function.  Patient presents to the emergency department for generalized weakness since this morning.  Largely negative review of systems otherwise.  Differentials quite broad but include electrolyte or metabolic abnormality, renal dysfunction, rhabdomyolysis, intracranial injury less likely CVA, COVID, other infectious etiology such as UTI.  Will IV hydrate the patient we will check labs  CT imaging of the head, chest x-ray, COVID swab urine sample and continue to closely monitor.  Patient agreeable to plan of care and workup.  CBC reassuring with slight leukocytosis 12,000.  Remainder lab work is pending.  Patient care signed out to oncoming provider.  FINAL CLINICAL IMPRESSION(S) / ED DIAGNOSES   Weakness   Note:  This document was prepared using Dragon voice recognition software and may include unintentional dictation errors.   Minna Antis, MD 07/16/23 2311

## 2023-07-17 ENCOUNTER — Encounter: Payer: Self-pay | Admitting: Internal Medicine

## 2023-07-17 DIAGNOSIS — I719 Aortic aneurysm of unspecified site, without rupture: Secondary | ICD-10-CM

## 2023-07-17 DIAGNOSIS — U071 COVID-19: Secondary | ICD-10-CM | POA: Diagnosis not present

## 2023-07-17 DIAGNOSIS — I5A Non-ischemic myocardial injury (non-traumatic): Secondary | ICD-10-CM | POA: Diagnosis present

## 2023-07-17 DIAGNOSIS — J449 Chronic obstructive pulmonary disease, unspecified: Secondary | ICD-10-CM | POA: Diagnosis not present

## 2023-07-17 DIAGNOSIS — F418 Other specified anxiety disorders: Secondary | ICD-10-CM

## 2023-07-17 DIAGNOSIS — I10 Essential (primary) hypertension: Secondary | ICD-10-CM | POA: Diagnosis not present

## 2023-07-17 DIAGNOSIS — A419 Sepsis, unspecified organism: Secondary | ICD-10-CM | POA: Diagnosis present

## 2023-07-17 HISTORY — DX: Non-ischemic myocardial injury (non-traumatic): I5A

## 2023-07-17 LAB — URINALYSIS, W/ REFLEX TO CULTURE (INFECTION SUSPECTED)
Bilirubin Urine: NEGATIVE
Glucose, UA: NEGATIVE mg/dL
Ketones, ur: NEGATIVE mg/dL
Nitrite: POSITIVE — AB
Protein, ur: NEGATIVE mg/dL
Specific Gravity, Urine: 1.006 (ref 1.005–1.030)
WBC, UA: >50 WBC/hpf (ref 0–5)
pH: 8 (ref 5.0–8.0)

## 2023-07-17 LAB — BASIC METABOLIC PANEL
Anion gap: 8 (ref 5–15)
BUN: 11 mg/dL (ref 8–23)
CO2: 24 mmol/L (ref 22–32)
Calcium: 8.6 mg/dL — ABNORMAL LOW (ref 8.9–10.3)
Chloride: 105 mmol/L (ref 98–111)
Creatinine, Ser: 1.07 mg/dL (ref 0.61–1.24)
GFR, Estimated: >60 mL/min (ref >60)
Glucose, Bld: 120 mg/dL — ABNORMAL HIGH (ref 70–99)
Potassium: 3.6 mmol/L (ref 3.5–5.1)
Sodium: 137 mmol/L (ref 135–145)

## 2023-07-17 LAB — HEMOGLOBIN A1C
Hgb A1c MFr Bld: 6.1 % — ABNORMAL HIGH (ref 4.8–5.6)
Mean Plasma Glucose: 128 mg/dL

## 2023-07-17 LAB — CBC
HCT: 43.2 % (ref 39.0–52.0)
Hemoglobin: 15.4 g/dL (ref 13.0–17.0)
MCH: 30.6 pg (ref 26.0–34.0)
MCHC: 35.6 g/dL (ref 30.0–36.0)
MCV: 85.9 fL (ref 80.0–100.0)
Platelets: 191 10*3/uL (ref 150–400)
RBC: 5.03 MIL/uL (ref 4.22–5.81)
RDW: 14.1 % (ref 11.5–15.5)
WBC: 9.6 10*3/uL (ref 4.0–10.5)
nRBC: 0 % (ref 0.0–0.2)

## 2023-07-17 LAB — BRAIN NATRIURETIC PEPTIDE: B Natriuretic Peptide: 186.4 pg/mL — ABNORMAL HIGH (ref 0.0–100.0)

## 2023-07-17 LAB — LIPID PANEL
Cholesterol: 157 mg/dL (ref 0–200)
HDL: 51 mg/dL (ref >40)
LDL Cholesterol: 89 mg/dL (ref 0–99)
Total CHOL/HDL Ratio: 3.1 RATIO
Triglycerides: 87 mg/dL (ref <150)
VLDL: 17 mg/dL (ref 0–40)

## 2023-07-17 LAB — LACTIC ACID, PLASMA: Lactic Acid, Venous: 1.4 mmol/L (ref 0.5–1.9)

## 2023-07-17 LAB — PROCALCITONIN: Procalcitonin: 0.18 ng/mL

## 2023-07-17 LAB — TROPONIN I (HIGH SENSITIVITY): Troponin I (High Sensitivity): 21 ng/L — ABNORMAL HIGH (ref <18)

## 2023-07-17 MED ORDER — DULOXETINE HCL 30 MG PO CPEP
30.0000 mg | ORAL_CAPSULE | Freq: Every day | ORAL | Status: DC
  Administered 2023-07-17 – 2023-07-21 (×5): 30 mg via ORAL
  Filled 2023-07-17 (×5): qty 1

## 2023-07-17 MED ORDER — ENOXAPARIN SODIUM 40 MG/0.4ML IJ SOSY
40.0000 mg | PREFILLED_SYRINGE | INTRAMUSCULAR | Status: DC
  Administered 2023-07-17 – 2023-07-21 (×5): 40 mg via SUBCUTANEOUS
  Filled 2023-07-17 (×5): qty 0.4

## 2023-07-17 MED ORDER — CLONAZEPAM 0.5 MG PO TABS: 0.5000 mg | ORAL_TABLET | Freq: Two times a day (BID) | ORAL | Status: DC | PRN

## 2023-07-17 MED ORDER — TAMSULOSIN HCL 0.4 MG PO CAPS
0.4000 mg | ORAL_CAPSULE | Freq: Every day | ORAL | Status: DC
  Administered 2023-07-17 – 2023-07-21 (×5): 0.4 mg via ORAL
  Filled 2023-07-17 (×5): qty 1

## 2023-07-17 MED ORDER — METHOCARBAMOL 500 MG PO TABS: 500.0000 mg | ORAL_TABLET | Freq: Three times a day (TID) | ORAL | Status: DC | PRN

## 2023-07-17 MED ORDER — LACTATED RINGERS IV SOLN
150.0000 mL/h | INTRAVENOUS | Status: DC
  Administered 2023-07-17 (×2): 150 mL/h via INTRAVENOUS

## 2023-07-17 MED ORDER — AZITHROMYCIN 500 MG PO TABS: 250.0000 mg | ORAL_TABLET | Freq: Every day | ORAL | Status: DC

## 2023-07-17 MED ORDER — PANTOPRAZOLE SODIUM 40 MG PO TBEC
40.0000 mg | DELAYED_RELEASE_TABLET | Freq: Every day | ORAL | Status: DC
  Administered 2023-07-17 – 2023-07-21 (×5): 40 mg via ORAL
  Filled 2023-07-17 (×5): qty 1

## 2023-07-17 MED ORDER — CLOPIDOGREL BISULFATE 75 MG PO TABS
75.0000 mg | ORAL_TABLET | Freq: Every day | ORAL | Status: DC
  Administered 2023-07-17 – 2023-07-21 (×5): 75 mg via ORAL
  Filled 2023-07-17 (×5): qty 1

## 2023-07-17 MED ORDER — METOPROLOL SUCCINATE ER 25 MG PO TB24
25.0000 mg | ORAL_TABLET | Freq: Two times a day (BID) | ORAL | Status: DC
  Administered 2023-07-17 – 2023-07-21 (×8): 25 mg via ORAL
  Filled 2023-07-17 (×8): qty 1

## 2023-07-17 MED ORDER — SODIUM CHLORIDE 0.9 % IV SOLN
1.0000 g | INTRAVENOUS | Status: DC
  Administered 2023-07-17 – 2023-07-18 (×3): 1 g via INTRAVENOUS
  Filled 2023-07-17 (×3): qty 10

## 2023-07-17 MED ORDER — AMLODIPINE BESYLATE 10 MG PO TABS
10.0000 mg | ORAL_TABLET | Freq: Every day | ORAL | Status: DC
  Administered 2023-07-17 – 2023-07-21 (×5): 10 mg via ORAL
  Filled 2023-07-17: qty 2
  Filled 2023-07-17 (×3): qty 1
  Filled 2023-07-17: qty 2

## 2023-07-17 MED ORDER — LORATADINE 10 MG PO TABS
10.0000 mg | ORAL_TABLET | Freq: Every day | ORAL | Status: DC
  Administered 2023-07-17 – 2023-07-20 (×4): 10 mg via ORAL
  Filled 2023-07-17 (×4): qty 1

## 2023-07-17 MED ORDER — ASPIRIN 81 MG PO TBEC
81.0000 mg | DELAYED_RELEASE_TABLET | Freq: Every day | ORAL | Status: DC
  Administered 2023-07-17 – 2023-07-21 (×5): 81 mg via ORAL
  Filled 2023-07-17 (×5): qty 1

## 2023-07-17 MED ORDER — ALBUTEROL SULFATE (2.5 MG/3ML) 0.083% IN NEBU: 2.5000 mg | INHALATION_SOLUTION | RESPIRATORY_TRACT | Status: DC | PRN

## 2023-07-17 MED ORDER — SIMETHICONE 80 MG PO CHEW: 80.0000 mg | CHEWABLE_TABLET | Freq: Four times a day (QID) | ORAL | Status: DC | PRN

## 2023-07-17 MED ORDER — IPRATROPIUM-ALBUTEROL 0.5-2.5 (3) MG/3ML IN SOLN
3.0000 mL | RESPIRATORY_TRACT | Status: DC
  Administered 2023-07-17 – 2023-07-18 (×10): 3 mL via RESPIRATORY_TRACT
  Filled 2023-07-17 (×10): qty 3

## 2023-07-17 MED ORDER — AZITHROMYCIN 500 MG PO TABS
500.0000 mg | ORAL_TABLET | Freq: Every day | ORAL | Status: AC
  Administered 2023-07-17: 500 mg via ORAL
  Filled 2023-07-17: qty 1

## 2023-07-17 MED ORDER — ACETAMINOPHEN 325 MG PO TABS: 650.0000 mg | ORAL_TABLET | Freq: Four times a day (QID) | ORAL | Status: DC | PRN

## 2023-07-17 MED ORDER — ABACAVIR-DOLUTEGRAVIR-LAMIVUD 600-50-300 MG PO TABS
1.0000 | ORAL_TABLET | Freq: Every day | ORAL | Status: DC
  Administered 2023-07-17 – 2023-07-21 (×5): 1 via ORAL
  Filled 2023-07-17 (×5): qty 1

## 2023-07-17 MED ORDER — ONDANSETRON HCL 4 MG/2ML IJ SOLN: 4.0000 mg | Freq: Three times a day (TID) | INTRAMUSCULAR | Status: DC | PRN

## 2023-07-17 MED ORDER — ATORVASTATIN CALCIUM 20 MG PO TABS
40.0000 mg | ORAL_TABLET | Freq: Every day | ORAL | Status: DC
  Administered 2023-07-17 – 2023-07-21 (×5): 40 mg via ORAL
  Filled 2023-07-17 (×5): qty 2

## 2023-07-17 MED ORDER — DUTASTERIDE 0.5 MG PO CAPS
0.5000 mg | ORAL_CAPSULE | Freq: Every day | ORAL | Status: DC
  Administered 2023-07-17 – 2023-07-21 (×5): 0.5 mg via ORAL
  Filled 2023-07-17 (×5): qty 1

## 2023-07-17 MED ORDER — HYDRALAZINE HCL 20 MG/ML IJ SOLN: 5.0000 mg | INTRAMUSCULAR | Status: DC | PRN

## 2023-07-17 MED ORDER — DM-GUAIFENESIN ER 30-600 MG PO TB12: 1.0000 | ORAL_TABLET | Freq: Two times a day (BID) | ORAL | Status: DC | PRN

## 2023-07-17 MED ORDER — EZETIMIBE 10 MG PO TABS
10.0000 mg | ORAL_TABLET | Freq: Every day | ORAL | Status: DC
  Administered 2023-07-17 – 2023-07-21 (×5): 10 mg via ORAL
  Filled 2023-07-17 (×4): qty 1

## 2023-07-17 NOTE — ED Notes (Signed)
Patient was noted to be wet. Patient was dried and put in a clean brief.

## 2023-07-17 NOTE — Evaluation (Signed)
Occupational Therapy Evaluation Patient Details Name: Brian [name] MRN: [id number] DOB: 08/21/1941 Today's Date: 07/17/2023   History of Present Illness Kurtiss Coovert is a 82 y.o. 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 Pt found to be CV-19 + while in the ED.   Clinical Impression   Mr. Teaney was seen for OT evaluation this date. Prior to hospital admission, pt was MOD I for ADL management using a RW for [facility] distances. Per DIL at bedside, pt uses a transport chair for further community distances. Pt lives alone in a 1 level appartment in a senior independent living community. Pt has family who assist him with transportation to/from Drs. Appointments, meal prep, etc. Pt presents to acute OT demonstrating impaired ADL performance and functional mobility 2/2 decreased cardiopulmonary status, generalized weakness, decreased cognition, and decreased balance (See OT problem list for additional functional deficits). Pt currently requires MAX A to attempt bed mobility but is unable to sustain sitting at EOB, requires TOTAL assist to return to supine position in bed.  Pt would benefit from skilled OT services to address noted impairments and functional limitations (see below for any additional details) in order to maximize safety and independence while minimizing falls risk and caregiver burden. Anticipate the need for follow up therapy services upon acute hospital DC (</=3 hours/day).        If plan is discharge home, recommend the following: Two people to help with walking and/or transfers;A lot of help with bathing/dressing/bathroom;Assist for transportation;Help with stairs or ramp for entrance;Assistance with feeding     Functional Status Assessment  Patient has had a recent decline in their functional status and demonstrates the ability to make significant improvements in function in a reasonable and predictable amount of time.  Equipment Recommendations  BSC/3in1    Recommendations for Other Services       Precautions / Restrictions Precautions Precautions: Fall Restrictions Weight Bearing Restrictions: No      Mobility Bed Mobility Overal bed mobility: Needs Assistance Bed Mobility: Supine to Sit, Sit to Supine     Supine to sit: Max assist, HOB elevated Sit to supine: HOB elevated, Total assist   General bed mobility comments: MAX A to attempt sitting at EOB, once upright pt is unable to maintain sitting balance without back support requires TOTAL A to safely return to supine position in bed. +2 for bed boost.    Transfers                   General transfer comment: NT      Balance Overall balance assessment: Needs assistance Sitting-balance support: Feet supported, Bilateral upper extremity supported Sitting balance-Leahy Scale: Zero Sitting balance - Comments: Unable to come to full stting position at EOB Postural control: Posterior lean                                 ADL either performed or assessed with clinical judgement   ADL Overall ADL's : Needs assistance/impaired                                       General ADL Comments: Pt significantly limited  2/2 generalized weakness, limited cardiopulmonary status, and decreased activity tolerance. He requires MAX A to attempt sitting EOB this date and is unable to maintain sitting balance beore laying back in bed. Reports feeling too fatigued to do more functional activity. Per DIL at bedside is better today, but still significantly weak as compared to baseline. Anticipate MIN A for UB ADL management at bed level, likely + 2 assist for safety with further functional transfer attempts.      Vision Patient Visual Report: No change from baseline       Perception         Praxis         Pertinent Vitals/Pain Pain Assessment Pain Assessment: No/denies pain     Extremity/Trunk Assessment Upper Extremity Assessment Upper Extremity Assessment: Generalized weakness   Lower Extremity Assessment Lower Extremity Assessment: Generalized weakness       Communication Communication Communication:  (DIL interprets t/o majority of session per pt request. Attempted use of language line interpreter services, however, line disconnects ~5 minutes into session.) Cueing Techniques: Verbal cues;Gestural cues   Cognition Arousal: Lethargic Behavior During Therapy: WFL for tasks assessed/performed, Flat affect Overall Cognitive Status: Impaired/Different from baseline Area of Impairment: Following commands                       Following Commands: Follows one step commands with increased time       General Comments: Per DIL pt is typically A&O. He is noted to be lethargic this date and reports feeling very tired. DIL states pt not at his baseline.     General Comments       Exercises Other Exercises Other Exercises: Pt/dtr educated on role of OT in acute setting, safety, falls prevention strategies for home and hospital and DC recs.   Shoulder Instructions      Home Living Family/patient expects to be discharged to:: Private residence (Independent senior community) Living Arrangements: Alone Available Help at Discharge: Family Type of Home: Apartment Home Access: Level entry     Home Layout: One level     Bathroom Shower/Tub: Producer, television/film/video: Standard     Home Equipment: Shower seat - built in;Grab bars - tub/shower;Grab bars - toilet;Rolling Environmental consultant (2 wheels)          Prior Functioning/Environment Prior Level of Function : Independent/Modified Independent             Mobility Comments: Uses RW for HH mobility, per DIL  uses transport chare for community distances ADLs Comments: Family assists with some bathing 2/2 baseline R sided weakness from prior stroke.        OT Problem List: Decreased strength;Decreased knowledge of use of DME or AE;Decreased coordination;Impaired balance (sitting and/or standing);Decreased safety awareness;Cardiopulmonary status limiting activity;Decreased cognition;Decreased activity tolerance      OT Treatment/Interventions: Self-care/ADL training;Therapeutic exercise;Energy conservation;DME and/or AE instruction;Visual/perceptual remediation/compensation;Patient/family education;Cognitive remediation/compensation    OT Goals(Current goals can be found in the care plan section) Acute Rehab OT Goals Patient Stated Goal: To feel better OT Goal Formulation: With patient/family Time For Goal Achievement: 07/31/23 Potential to Achieve Goals: Good ADL Goals Pt Will Perform Grooming: sitting;with set-up;with supervision Pt Will Perform Lower Body Dressing: sit to/from stand;with set-up;with supervision Pt Will Transfer to Toilet: bedside commode;with min assist;ambulating Pt Will Perform Toileting - Clothing Manipulation and hygiene: sit to/from stand;with supervision;with set-up  OT Frequency: Min 1X/week    Co-evaluation  AM-PAC OT "6 Clicks" Daily Activity     Outcome Measure Help from another person eating meals?: A Little Help from another person taking care of personal grooming?: A Little Help from another person toileting, which includes using toliet, bedpan, or urinal?: A Lot Help from another person bathing (including washing, rinsing, drying)?: A Lot Help from another person to put on and taking off regular upper body clothing?: A Lot Help from another person to put on and taking off regular lower body clothing?: A Lot 6 Click Score: 14   End of Session Nurse Communication: Mobility status  Activity Tolerance: Patient limited by fatigue Patient  left: in bed;with call bell/phone within reach;with bed alarm set;with family/visitor present  OT Visit Diagnosis: Other abnormalities of gait and mobility (R26.89);Muscle weakness (generalized) (M62.81)                Time: 6213-0865 OT Time Calculation (min): 26 min Charges:  OT General Charges $OT Visit: 1 Visit OT Evaluation $OT Eval Moderate Complexity: 1 Mod OT Treatments $Self Care/Home Management : 8-22 mins  Rockney Ghee, M.S., OTR/L 07/17/23, 3:35 PM

## 2023-07-17 NOTE — ED Notes (Signed)
Unable to pull Ezetimibe from Pyxis due to not being within my scope

## 2023-07-17 NOTE — Progress Notes (Signed)
Patient's daughter at bedside. Examined in the ER. Not the best historian came in with generalized weakness and some cough. Feeling weak and deconditioned. Found to have COVID infection. No pneumonia and chest x-ray. Patient overall feels rough. Tells me he had some burning urine and dark-colored urine yesterday today feels okay. Will discontinue Zithromax continue Rocephin for now change to oral antibiotic tomorrow. UA looks abnormal. Encouraged to eat and keep fluids down. PT to work with patient. He uses walker at home. No recent falls per family.  No charge

## 2023-07-17 NOTE — ED Notes (Signed)
Pt was cleaned and bed changed--pure wick is not working for the pt

## 2023-07-17 NOTE — H&P (Signed)
History and Physical    Brian [name] WUJ:[id number] DOB: [birth year] DOA: 07/16/2023  Referring MD/NP/PA:   PCP: System, Provider Not In   Patient coming from:  The patient is coming from home.     Chief Complaint: Cough, shortness of breath and generalized weakness  HPI: Brian [name] is a 82 y.o. 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  Per his daughter- in law at the bedside, patient has been sick for several days.  He has cough with little mucus production, shortness of breath, no chest pain, fever or chills.  Patient haa generalized weakness and leg muscle cramps.  Normally he can use walker to walk. States he is so weak that he laid on the ground and could not get back up for several hours.  Patient had diarrhea 3 days ago which has resolved.  Currently no nausea,vomiting, diarrhea or abdominal pain.  Denies symptoms of UTI.  Data reviewed independently and ED Course: pt was found to have positive COVID-19, troponin 24, WBC 12.2, GFR> 60, temperature normal, blood pressure 163/99, heart rate 105, RR 24, oxygen sat 92-96% on room air.  Chest x-ray negative.  CT of head negative.  Patient is placed on telemetry bed for observation.   EKG: I have personally reviewed.  Sinus rhythm, QTc 484, LAD, poor R wave progression, incomplete right bundle blockade.   Review of Systems:   General: no fevers, chills, no body weight gain, has poor appetite, has fatigue HEENT: no blurry vision, hearing changes or sore throat Respiratory: has dyspnea, coughing, no wheezing CV: no chest pain, no palpitations GI: no nausea, vomiting, abdominal pain, diarrhea, constipation GU: no dysuria, burning on urination, increased  urinary frequency, hematuria  Ext: no leg edema Neuro: no unilateral weakness, numbness, or tingling, no vision change or hearing loss Skin: no rash, no skin tear. MSK: Has leg muscle cramps.  No deformity, no limitation of range of movement in spin Heme: No easy bruising.  Travel history: No recent long distant travel.   Allergy: No Known Allergies  Past Medical History:  Diagnosis Date   Arthritis    Asthma    Bradycardia    Depression    HIV (human immunodeficiency virus infection) (HCC)    Hyperlipidemia     Past Surgical History:  Procedure Laterality Date   cataracts     EYE SURGERY     PACEMAKER INSERTION N/A 08/02/2015   Procedure: INSERTION PACEMAKER;  Surgeon: Corky Downs, MD;  Location: [name];  Service: Cardiovascular;  Laterality: N/A;    Social History:  reports that he has never smoked. He has never used smokeless tobacco. He reports current alcohol use of about 2.0 standard drinks of alcohol per week. He reports that he does not use drugs.  Family History:  Family History  Problem Relation Age of Onset   Heart disease Brother    Hypertension Other      Prior to Admission medications   Medication Sig Start Date End Date Taking? Authorizing Provider  abacavir-dolutegravir-lamiVUDine (TRIUMEQ) 600-50-300 MG tablet Take 1 tablet by mouth daily. 01/22/23   Lynn Ito, MD  alendronate (FOSAMAX) 70 MG tablet Take 70 mg by mouth once a week. Take on Saturday. 07/21/15   [provider]  amLODipine (NORVASC) 10 MG tablet Take 1  tablet (10 mg total) by mouth daily. 05/02/22   Dorcas Carrow, MD  atorvastatin (LIPITOR) 40 MG tablet Take 40 mg by mouth daily.    [provider]  azelastine (OPTIVAR) 0.05 % ophthalmic solution Apply 1 drop to eye 2 (two) times daily. 03/28/21   [provider]  clonazePAM (KLONOPIN) 0.5 MG tablet Take 1 tablet (0.5 mg total) by mouth 2 (two) times daily as needed for up to 5 days for anxiety. 05/01/22  05/06/22  Dorcas Carrow, MD  DULoxetine (CYMBALTA) 30 MG capsule Take 30 mg by mouth daily. 08/14/20   [provider]  dutasteride (AVODART) 0.5 MG capsule Take 1 capsule (0.5 mg total) by mouth daily. 10/25/20   Sondra Come, MD  ezetimibe (ZETIA) 10 MG tablet Take 10 mg by mouth daily. 06/25/22   [provider]  loratadine (CLARITIN) 10 MG tablet Take 10 mg by mouth daily.    [provider]  metoprolol succinate (TOPROL-XL) 25 MG 24 hr tablet TAKE 1 TABLET (25 MG TOTAL) BY MOUTH IN THE MORNING AND AT BEDTIME. 03/11/22   Corky Downs, MD  pantoprazole (PROTONIX) 40 MG tablet Take 1 tablet (40 mg total) by mouth daily. 05/03/22   Dorcas Carrow, MD  pregabalin (LYRICA) 50 MG capsule Take 50 mg by mouth in the morning and at bedtime.    [provider]  simethicone (MYLICON) 80 MG chewable tablet Chew 1 tablet (80 mg total) by mouth every 6 (six) hours as needed for flatulence. 05/02/22   Dorcas Carrow, MD  tamsulosin (FLOMAX) 0.4 MG CAPS capsule Take 1 capsule (0.4 mg total) by mouth daily. 10/25/20   Sondra Come, MD  triamcinolone cream (KENALOG) 0.1 % Apply 1 application topically 2 (two) times daily. 06/25/21   [provider]  VENTOLIN HFA 108 (90 BASE) MCG/ACT inhaler Inhale 2 puffs into the lungs 4 (four) times daily as needed. For shortness of breath and/or wheezing. 07/21/15   [provider]    Physical Exam: Vitals:   07/16/23 2226 07/16/23 2230 07/16/23 2300 07/16/23 2330  BP:  (!) 143/89 (!) 151/90 (!) 163/99  Pulse:  99 80 82  Resp:  20 (!) 24   Temp:      TempSrc:      SpO2:  92% 96% 93%  Weight: 73.9 kg     Height: 5\' 7"  (1.702 m)      General: Not in acute distress HEENT:       Eyes: PERRL, EOMI, no jaundice       ENT: No discharge from the ears and nose, no pharynx injection, no tonsillar enlargement.        Neck: No JVD, no bruit, no mass felt. Heme: No neck lymph node enlargement. Cardiac: S1/S2, RRR, No  murmurs, No gallops or rubs. Respiratory: No rales, wheezing, rhonchi or rubs. GI: Soft, nondistended, nontender, no rebound pain, no organomegaly, BS present. GU: No hematuria Ext: No pitting leg edema bilaterally. 1+DP/PT pulse bilaterally. Musculoskeletal: No joint deformities, No joint redness or warmth, no limitation of ROM in spin. Skin: No rashes.  Neuro: Alert, oriented X3, cranial nerves II-XII grossly intact, moves all extremities. Psych: Patient is not psychotic, no suicidal or hemocidal ideation.  Labs on Admission: I have personally reviewed following labs and imaging studies  CBC: Recent Labs  Lab 07/16/23 2250  WBC 12.2*  HGB 16.1  HCT 44.7  MCV 85.1  PLT 219   Basic Metabolic Panel: Recent Labs  Lab 07/16/23 2250  NA 135  K 3.9  CL 100  CO2 23  GLUCOSE 118*  BUN 13  CREATININE 1.15  CALCIUM 9.0   GFR: Estimated Creatinine Clearance: 46.3 mL/min (by C-G formula based on SCr of 1.15 mg/dL). Liver Function Tests: Recent Labs  Lab 07/16/23 2250  AST 23  ALT 14  ALKPHOS 46  BILITOT 1.3*  PROT 7.9  ALBUMIN 4.0   No results for input(s): "LIPASE", "AMYLASE" in the last 168 hours. No results for input(s): "AMMONIA" in the last 168 hours. Coagulation Profile: No results for input(s): "INR", "PROTIME" in the last 168 hours. Cardiac Enzymes: Recent Labs  Lab 07/16/23 2250  CKTOTAL 154   BNP (last 3 results) No results for input(s): "PROBNP" in the last 8760 hours. HbA1C: No results for input(s): "HGBA1C" in the last 72 hours. CBG: No results for input(s): "GLUCAP" in the last 168 hours. Lipid Profile: No results for input(s): "CHOL", "HDL", "LDLCALC", "TRIG", "CHOLHDL", "LDLDIRECT" in the last 72 hours. Thyroid Function Tests: No results for input(s): "TSH", "T4TOTAL", "FREET4", "T3FREE", "THYROIDAB" in the last 72 hours. Anemia Panel: No results for input(s): "VITAMINB12", "FOLATE", "FERRITIN", "TIBC", "IRON", "RETICCTPCT" in the last 72  hours. Urine analysis:    Component Value Date/Time   COLORURINE RED (A) 05/02/2022 1404   APPEARANCEUR TURBID (A) 05/02/2022 1404   APPEARANCEUR Cloudy (A) 10/25/2020 1303   LABSPEC 1.020 05/02/2022 1404   LABSPEC 1.015 11/09/2012 1958   PHURINE  05/02/2022 1404    TEST NOT REPORTED DUE TO COLOR INTERFERENCE OF URINE PIGMENT   GLUCOSEU (A) 05/02/2022 1404    TEST NOT REPORTED DUE TO COLOR INTERFERENCE OF URINE PIGMENT   GLUCOSEU Negative 11/09/2012 1958   HGBUR (A) 05/02/2022 1404    TEST NOT REPORTED DUE TO COLOR INTERFERENCE OF URINE PIGMENT   BILIRUBINUR (A) 05/02/2022 1404    TEST NOT REPORTED DUE TO COLOR INTERFERENCE OF URINE PIGMENT   BILIRUBINUR Negative 10/25/2020 1303   BILIRUBINUR Negative 11/09/2012 1958   KETONESUR (A) 05/02/2022 1404    TEST NOT REPORTED DUE TO COLOR INTERFERENCE OF URINE PIGMENT   PROTEINUR (A) 05/02/2022 1404    TEST NOT REPORTED DUE TO COLOR INTERFERENCE OF URINE PIGMENT   NITRITE (A) 05/02/2022 1404    TEST NOT REPORTED DUE TO COLOR INTERFERENCE OF URINE PIGMENT   LEUKOCYTESUR (A) 05/02/2022 1404    TEST NOT REPORTED DUE TO COLOR INTERFERENCE OF URINE PIGMENT   LEUKOCYTESUR 3+ 11/09/2012 1958   Sepsis Labs: @LABRCNTIP (procalcitonin:4,lacticidven:4) ) Recent Results (from the past 240 hour(s))  SARS Coronavirus 2 by RT PCR (hospital order, performed in [facility] hospital lab) *cepheid single result test* Anterior Nasal Swab     Status: Abnormal   Collection Time: 07/16/23 10:50 PM   Specimen: Anterior Nasal Swab  Result Value Ref Range Status   SARS Coronavirus 2 by RT PCR POSITIVE (A) NEGATIVE Final    Comment: (NOTE) SARS-CoV-2 target nucleic acids are DETECTED  SARS-CoV-2 RNA is generally detectable in upper respiratory specimens  during the acute phase of infection.  Positive results are indicative  of the presence of the identified virus, but do not rule out bacterial infection or co-infection with other pathogens not detected by  the test.  Clinical correlation with patient history and  other diagnostic information is necessary to determine patient infection status.  The expected result is negative.  Fact Sheet for Patients:   RoadLapTop.co.za   Fact Sheet for Healthcare Providers:   http://kim-miller.com/    This test is  not yet approved or cleared by the [place] and  has been authorized for detection and/or diagnosis of SARS-CoV-2 by FDA under an Emergency Use Authorization (EUA).  This EUA will remain in effect (meaning this test can be used) for the duration of  the COVID-19 declaration under Section 564(b)(1)  of the Act, 21 U.S.C. section 360-bbb-3(b)(1), unless the authorization is terminated or revoked sooner.   Performed at [facility], [address] [place], [place] [address]      Radiological Exams on Admission: DG Chest Portable 1 View  Result Date: 07/16/2023 CLINICAL DATA:  Increasing weakness and confusion since this morning, cough EXAM: PORTABLE CHEST 1 VIEW COMPARISON:  07/13/2021 FINDINGS: Single frontal view of the chest demonstrates stable dual lead pacer. The cardiac silhouette is enlarged but stable. Prominent vascular shadow in the right paratracheal region unchanged. No airspace disease, effusion, or pneumothorax. No acute bony abnormalities. IMPRESSION: 1. Stable chest, no acute intrathoracic process. Electronically Signed   By: Sharlet Salina M.D.   On: 07/16/2023 22:55   CT HEAD WO CONTRAST ( )  Result Date: 07/16/2023 CLINICAL DATA:  Increasing weakness and confusion since this morning, generalized body aches and cough EXAM: CT HEAD WITHOUT CONTRAST TECHNIQUE: Contiguous axial images were obtained from the base of the skull through the vertex without intravenous contrast. RADIATION DOSE REDUCTION: This exam was performed according to the departmental dose-optimization program which includes automated exposure  control, adjustment of the mA and/or kV according to patient size and/or use of iterative reconstruction technique. COMPARISON:  04/25/2022 FINDINGS: Brain: Chronic small vessel ischemic changes are seen within the periventricular white matter, bilateral basal ganglia, and right anterior pons. No evidence of acute infarct or hemorrhage. Lateral ventricles and remaining midline structures are unremarkable. No acute extra-axial fluid collections. No mass effect. Vascular: No hyperdense vessel or unexpected calcification. Skull: Normal. Negative for fracture or focal lesion. Sinuses/Orbits: No acute finding. Other: None. IMPRESSION: 1. No acute intracranial process. 2. Chronic small vessel ischemic changes as above. Electronically Signed   By: Sharlet Salina M.D.   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  Patient meets criteria for sepsis with WBC 12.2, heart rate up to 105 and RR 24.  Chest x-ray negative for infiltration. Leukocytosis is unusual for COVID infection, patient may have superimposed infection.  Since patient is immunocompromised due to HIV. Will start empiric antibiotics.  -will place in tele bed for obs -Bronchodilators -As needed Mucinex -IV Rocephin plus azithromycin -Sputum culture -Blood culture -Check lactic acid level and procalcitonin level -IV fluid: 1 L normal saline -PT/OT -Follow-up urinalysis  COPD (chronic obstructive pulmonary disease)  (HCC) -Bronchodilators  History of diastolic CHF: 2D echo on 04/25/2022 showed EF of 55-60% with grade 2 diastolic dysfunction.  Patient does not have leg edema.  CHF seem to be compensated. 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         Disposition Plan:  Anticipate discharge back to previous environment  Consults called: None  Admission status and Level of care: Telemetry Medical:     for obs    Dispo: The patient is from: Home              Anticipated d/c is to: Home              Anticipated d/c date is: 1 day              Patient currently is not medically stable to d/c.    Severity of Illness:  The appropriate patient status for this patient is OBSERVATION. Observation status is judged to be reasonable and necessary in order to provide the required intensity of service to ensure the patient's safety. The patient's presenting symptoms, physical exam findings, and initial radiographic and laboratory data in the context of their medical condition is felt to place them at decreased risk for further clinical deterioration. Furthermore, it is anticipated that the patient will be medically stable for discharge from the hospital within 2 midnights of admission.        Date of Service 07/17/2023    Lorretta Harp [facility]   If 7PM-7AM, please contact night-coverage [email or web address] 07/17/2023, 1:23 AM

## 2023-07-18 DIAGNOSIS — A4189 Other specified sepsis: Secondary | ICD-10-CM | POA: Diagnosis present

## 2023-07-18 DIAGNOSIS — J4489 Other specified chronic obstructive pulmonary disease: Secondary | ICD-10-CM | POA: Diagnosis present

## 2023-07-18 DIAGNOSIS — Z950 Presence of cardiac pacemaker: Secondary | ICD-10-CM | POA: Diagnosis not present

## 2023-07-18 DIAGNOSIS — Z66 Do not resuscitate: Secondary | ICD-10-CM | POA: Diagnosis present

## 2023-07-18 DIAGNOSIS — E785 Hyperlipidemia, unspecified: Secondary | ICD-10-CM | POA: Diagnosis present

## 2023-07-18 DIAGNOSIS — N400 Enlarged prostate without lower urinary tract symptoms: Secondary | ICD-10-CM | POA: Diagnosis present

## 2023-07-18 DIAGNOSIS — I5A Non-ischemic myocardial injury (non-traumatic): Secondary | ICD-10-CM | POA: Diagnosis present

## 2023-07-18 DIAGNOSIS — I5032 Chronic diastolic (congestive) heart failure: Secondary | ICD-10-CM | POA: Diagnosis present

## 2023-07-18 DIAGNOSIS — Z21 Asymptomatic human immunodeficiency virus [HIV] infection status: Secondary | ICD-10-CM | POA: Diagnosis present

## 2023-07-18 DIAGNOSIS — Z8673 Personal history of transient ischemic attack (TIA), and cerebral infarction without residual deficits: Secondary | ICD-10-CM | POA: Diagnosis not present

## 2023-07-18 DIAGNOSIS — W19XXXA Unspecified fall, initial encounter: Secondary | ICD-10-CM | POA: Diagnosis present

## 2023-07-18 DIAGNOSIS — F419 Anxiety disorder, unspecified: Secondary | ICD-10-CM | POA: Diagnosis present

## 2023-07-18 DIAGNOSIS — G4733 Obstructive sleep apnea (adult) (pediatric): Secondary | ICD-10-CM | POA: Diagnosis present

## 2023-07-18 DIAGNOSIS — R531 Weakness: Secondary | ICD-10-CM | POA: Diagnosis present

## 2023-07-18 DIAGNOSIS — U071 COVID-19: Secondary | ICD-10-CM | POA: Diagnosis present

## 2023-07-18 DIAGNOSIS — Z7983 Long term (current) use of bisphosphonates: Secondary | ICD-10-CM | POA: Diagnosis not present

## 2023-07-18 DIAGNOSIS — Z7982 Long term (current) use of aspirin: Secondary | ICD-10-CM | POA: Diagnosis not present

## 2023-07-18 DIAGNOSIS — Z79899 Other long term (current) drug therapy: Secondary | ICD-10-CM | POA: Diagnosis not present

## 2023-07-18 DIAGNOSIS — I110 Hypertensive heart disease with heart failure: Secondary | ICD-10-CM | POA: Diagnosis present

## 2023-07-18 DIAGNOSIS — F32A Depression, unspecified: Secondary | ICD-10-CM | POA: Diagnosis present

## 2023-07-18 DIAGNOSIS — R001 Bradycardia, unspecified: Secondary | ICD-10-CM | POA: Diagnosis present

## 2023-07-18 DIAGNOSIS — Z8249 Family history of ischemic heart disease and other diseases of the circulatory system: Secondary | ICD-10-CM | POA: Diagnosis not present

## 2023-07-18 LAB — URINE CULTURE

## 2023-07-18 MED ORDER — IPRATROPIUM-ALBUTEROL 20-100 MCG/ACT IN AERS
1.0000 | INHALATION_SPRAY | Freq: Four times a day (QID) | RESPIRATORY_TRACT | Status: DC
  Administered 2023-07-18 – 2023-07-21 (×10): 1 via RESPIRATORY_TRACT
  Filled 2023-07-18: qty 4

## 2023-07-18 MED ORDER — ALBUTEROL SULFATE HFA 108 (90 BASE) MCG/ACT IN AERS
1.0000 | INHALATION_SPRAY | RESPIRATORY_TRACT | Status: DC | PRN
  Administered 2023-07-19: 2 via RESPIRATORY_TRACT
  Filled 2023-07-18: qty 6.7

## 2023-07-18 MED ORDER — BISACODYL 10 MG RE SUPP
10.0000 mg | Freq: Once | RECTAL | Status: DC
  Filled 2023-07-18: qty 1

## 2023-07-18 MED ORDER — SENNA 8.6 MG PO TABS
1.0000 | ORAL_TABLET | Freq: Two times a day (BID) | ORAL | Status: DC
  Administered 2023-07-18 – 2023-07-21 (×6): 8.6 mg via ORAL
  Filled 2023-07-18 (×7): qty 1

## 2023-07-18 NOTE — Evaluation (Signed)
Physical Therapy Evaluation Patient Details Name: Brian [name] MRN: [id number] DOB: 1941/05/01 Today's Date: 07/18/2023  History of Present Illness  Brian [name] is an 82yoM who comes [facility] on 9/18 after severe acute onset weakness,AMS. (+) COVID19. At baseline pt lives alone at [facility] performs household distance AMB with RW, independent with ADL. PMH: PPM, HTN, HLD, COPD prn O2 at night, depression, GAD, HIV, BPH, OSA not using CPAP, stroke c chronic LLE pain, penetrating atherosclerotic ulcer of aortic arch  Clinical Impression  Pt in ED on arrival, DIL at bedside who facilitates communication with patient who speaks basic English only. Pt says he feels better, but still weak. No dizziness in session. Max effort without assist for most bed mobility, but he is unable to scoot forward to EOB without heavy assist. minA required to stand with hand held assist or with RW, neither of which helps patient balance adequately as he has multiple LOB in session that Thereasa Parkin recovers for him. Pt takes a few steps at bedside, falls backwards onto bed at one point. Pt's tshirt soaked in sweat. Pt's diaper needs a redo, RN made aware. Pt too weak to safe be on his own at home at present, family unable to provide 24/7 support. Will continue to follow.       If plan is discharge home, recommend the following: Assist for transportation;Help with stairs or ramp for entrance;Assistance with cooking/housework;A lot of help with walking and/or transfers   Can travel by private vehicle   No    Equipment Recommendations None recommended by PT  Recommendations for Other Services       Functional Status Assessment Patient has had a recent decline in their functional status and demonstrates the ability to make significant improvements in function in a reasonable and predictable amount of time.     Precautions / Restrictions Precautions Precautions: Fall Restrictions Weight Bearing Restrictions: No       Mobility  Bed Mobility   Bed Mobility: Supine to Sit, Sit to Supine     Supine to sit: Supervision Sit to supine: Supervision   General bed mobility comments: max effort required, appears very labored; unable to scoot to EOB despite efforts    Transfers Overall transfer level: Needs assistance Equipment used: Rolling walker (2 wheels), 1 person hand held assist Transfers: Sit to/from Stand Sit to Stand: Min assist                Ambulation/Gait Ambulation/Gait assistance: Min assist Gait Distance (Feet): 6 Feet Assistive device: Rolling walker (2 wheels)         General Gait Details: LOB durind AMB falls onto bed  Stairs            Wheelchair Mobility     Tilt Bed    Modified Rankin (Stroke Patients Only)       Balance                                             Pertinent Vitals/Pain Pain Assessment Pain Assessment: Faces Faces Pain Scale: Hurts little more Pain Location: Left knee/thigh Pain Intervention(s): Monitored during session, Premedicated before session, Repositioned    Home Living Family/patient expects to be discharged to:: Private residence Living Arrangements: Alone Available Help at Discharge: Family Type of Home: Apartment [facility]) Home Access: Level entry       Home Layout: One  level Home Equipment: Shower seat - built in;Grab bars - tub/shower;Grab bars - toilet;Rolling Walker (2 wheels) Additional Comments: oxygen    Prior Function Prior Level of Function : Independent/Modified Independent             Mobility Comments: Uses RW for HH mobility, per DIL uses transport chair for community distances ADLs Comments: Family assists with some bathing 2/2 baseline weakness from prior stroke.     Extremity/Trunk Assessment                Communication      Cognition Arousal: Alert Behavior During Therapy: WFL for tasks assessed/performed Overall Cognitive Status: Within  Functional Limits for tasks assessed                                          General Comments      Exercises     Assessment/Plan    PT Assessment Patient needs continued PT services  PT Problem List Decreased strength;Decreased range of motion;Decreased activity tolerance;Decreased balance;Decreased mobility;Decreased cognition       PT Treatment Interventions DME instruction;Gait training;Therapeutic activities;Therapeutic exercise;Balance training;Patient/family education    PT Goals (Current goals can be found in the Care Plan section)  Acute Rehab PT Goals Patient Stated Goal: feel better PT Goal Formulation: With patient Time For Goal Achievement: 08/01/23 Potential to Achieve Goals: Good    Frequency Min 1X/week     Co-evaluation               AM-PAC PT "6 Clicks" Mobility  Outcome Measure Help needed turning from your back to your side while in a flat bed without using bedrails?: A Little Help needed moving from lying on your back to sitting on the side of a flat bed without using bedrails?: A Little Help needed moving to and from a bed to a chair (including a wheelchair)?: A Lot Help needed standing up from a chair using your arms (e.g., wheelchair or bedside chair)?: A Lot Help needed to walk in hospital room?: A Lot Help needed climbing 3-5 steps with a railing? : A Lot 6 Click Score: 14    End of Session   Activity Tolerance: Patient tolerated treatment well;Patient limited by fatigue;No increased pain Patient left: in bed;with call bell/phone within reach;with family/visitor present Nurse Communication: Mobility status PT Visit Diagnosis: Difficulty in walking, not elsewhere classified (R26.2);Other abnormalities of gait and mobility (R26.89);Repeated falls (R29.6);Muscle weakness (generalized) (M62.81)    Time: 0865-7846 PT Time Calculation (min) (ACUTE ONLY): 17 min   Charges:   PT Evaluation $PT Eval Moderate Complexity: 1  Mod   PT General Charges $$ ACUTE PT VISIT: 1 Visit        11:18 AM, 07/18/23 Rosamaria Lints, PT, DPT Physical Therapist - [facility]  [phone] (ASCOM)    Jocob Dambach C 07/18/2023, 11:14 AM

## 2023-07-18 NOTE — Plan of Care (Signed)

## 2023-07-18 NOTE — Progress Notes (Signed)
[facility]  - [place] at [facility]   PATIENT NAME: Brian [name]    MR#:  [id number]  DATE OF BIRTH:  May 17, 1941  SUBJECTIVE:  daughter at bedside. Overall looks better feels better. Getting breathing treatment during my evaluation. Tolerating PO diet well. No respiratory distress. Evaluated by physical therapy early hours of the morning still remains weak.  VITALS:  Blood pressure 100/71, pulse 96, temperature 97.6 F (36.4 C), temperature source Oral, resp. rate 16, height 5\' 7"  (1.702 m), weight 66.5 kg, SpO2 96%.  PHYSICAL EXAMINATION:   GENERAL:  82 y.o.-year-old patient with no acute distress.  LUNGS: Normal breath sounds bilaterally, no wheezing CARDIOVASCULAR: S1, S2 normal. No murmur   ABDOMEN: Soft, nontender, nondistended. Bowel sounds present.  EXTREMITIES: No  edema b/l.    NEUROLOGIC: nonfocal  patient is alert and awake, weak   LABORATORY PANEL:  CBC Recent Labs  Lab 07/17/23 0359  WBC 9.6  HGB 15.4  HCT 43.2  PLT 191    Chemistries  Recent Labs  Lab 07/16/23 2250 07/17/23 0359  NA 135 137  K 3.9 3.6  CL 100 105  CO2 23 24  GLUCOSE 118* 120*  BUN 13 11  CREATININE 1.15 1.07  CALCIUM 9.0 8.6*  AST 23  --   ALT 14  --   ALKPHOS 46  --   BILITOT 1.3*  --    Cardiac Enzymes No results for input(s): "TROPONINI" in the last 168 hours. RADIOLOGY:  DG Chest Portable 1 View  Result Date: 07/16/2023 CLINICAL DATA:  Increasing weakness and confusion since this morning, cough EXAM: PORTABLE CHEST 1 VIEW COMPARISON:  07/13/2021 FINDINGS: Single frontal view of the chest demonstrates stable dual lead pacer. The cardiac silhouette is enlarged but stable. Prominent vascular shadow in the right paratracheal region unchanged. No airspace disease, effusion, or pneumothorax. No acute bony abnormalities. IMPRESSION: 1. Stable chest, no acute intrathoracic process. Electronically Signed   By: Sharlet Salina M.D.   On: 07/16/2023 22:55   CT HEAD  WO CONTRAST ( )  Result Date: 07/16/2023 CLINICAL DATA:  Increasing weakness and confusion since this morning, generalized body aches and cough EXAM: CT HEAD WITHOUT CONTRAST TECHNIQUE: Contiguous axial images were obtained from the base of the skull through the vertex without intravenous contrast. RADIATION DOSE REDUCTION: This exam was performed according to the departmental dose-optimization program which includes automated exposure control, adjustment of the mA and/or kV according to patient size and/or use of iterative reconstruction technique. COMPARISON:  04/25/2022 FINDINGS: Brain: Chronic small vessel ischemic changes are seen within the periventricular white matter, bilateral basal ganglia, and right anterior pons. No evidence of acute infarct or hemorrhage. Lateral ventricles and remaining midline structures are unremarkable. No acute extra-axial fluid collections. No mass effect. Vascular: No hyperdense vessel or unexpected calcification. Skull: Normal. Negative for fracture or focal lesion. Sinuses/Orbits: No acute finding. Other: None. IMPRESSION: 1. No acute intracranial process. 2. Chronic small vessel ischemic changes as above. Electronically Signed   By: Sharlet Salina M.D.   On: 07/16/2023 22:53    Assessment and Plan  Brian [name] is a 82 y.o. 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   Chest x-ray negative. CT of head negative.   COVID-19 virus infection and sepsis: Patient has positive  COVID 19, no new oxygen requirement.  Patient meets criteria for sepsis with WBC 12.2, heart rate up to 105 and RR 24.  Chest x-ray negative for infiltration. -- Patient overall improving. Received three doses of IV  Rocephin. White count normal. No clinically sign symptoms of pneumonia. Will DC antibiotics.-will place in tele bed for obs -Bronchodilators -As needed Mucinex -- urine culture multiple species.  COPD (chronic obstructive pulmonary disease) (HCC) -Bronchodilators   History of diastolic CHF: 2D echo on 04/25/2022 showed EF of 55-60% with grade 2 diastolic dysfunction.  Patient does not have leg edema.  CHF seem to be compensated. 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 Patient is weekend deconditioned. PT recommends rehab. Have discussed with patient and daughter will try to do PT OT on daily basis and if continues to improve then consider home with home health. Patient and daughter agreeable. In the meantime TOC will send out for rehab. Patient has COVID will need 10 day hospital stay. Will work on discharge planning pending clinical course--HH vs SNF  Procedures: Family communication : daughter Consults : none CODE STATUS: DNR DVT Prophylaxis : enoxaparin Level of care: Telemetry Medical Status is: Observation The patient remains OBS appropriate and will d/c before 2 midnights.    TOTAL TIME TAKING CARE OF THIS PATIENT: 35 minutes.  >50% time spent on counselling and coordination of care  Note: This dictation was prepared with Dragon dictation along with smaller phrase technology. Any transcriptional errors that result from  this process are unintentional.  Enedina Finner M.D    [facility]   CC: Primary care physician; System, Provider Not In

## 2023-07-19 DIAGNOSIS — U071 COVID-19: Secondary | ICD-10-CM | POA: Diagnosis not present

## 2023-07-19 NOTE — Progress Notes (Signed)
[facility]  - [place] at [facility]   PATIENT NAME: Brian [name]    MR#:  [id number]  DATE OF BIRTH:  Nov 22, 1940  SUBJECTIVE:  daughter at bedside. Overall looks better feels better.  Moving around in the bed quiet well VITALS:  Blood pressure (!) 140/81, pulse 65, temperature 98.3 F (36.8 C), resp. rate 20, height 5\' 7"  (1.702 m), weight 66.4 kg, SpO2 96%.  PHYSICAL EXAMINATION:   GENERAL:  82 y.o.-year-old patient with no acute distress.  LUNGS: Normal breath sounds bilaterally, no wheezing CARDIOVASCULAR: S1, S2 normal. No murmur   ABDOMEN: Soft, nontender, nondistended. Bowel sounds present.  EXTREMITIES: No  edema b/l.    NEUROLOGIC: nonfocal  patient is alert and awake, weak   LABORATORY PANEL:  CBC Recent Labs  Lab 07/17/23 0359  WBC 9.6  HGB 15.4  HCT 43.2  PLT 191    Chemistries  Recent Labs  Lab 07/16/23 2250 07/17/23 0359  NA 135 137  K 3.9 3.6  CL 100 105  CO2 23 24  GLUCOSE 118* 120*  BUN 13 11  CREATININE 1.15 1.07  CALCIUM 9.0 8.6*  AST 23  --   ALT 14  --   ALKPHOS 46  --   BILITOT 1.3*  --    Cardiac Enzymes No results for input(s): "TROPONINI" in the last 168 hours. RADIOLOGY:  No results found.  Assessment and Plan  Demaje Cuartas is a 82 y.o. 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   Chest x-ray negative. CT of head negative.   COVID-19 virus infection and sepsis: Patient has positive COVID 19, no new oxygen requirement.  Patient meets criteria for sepsis with WBC 12.2, heart rate up to 105 and RR 24.  Chest x-ray negative for infiltration. -- Patient overall improving. Received three doses of IV Rocephin. White count normal. No  clinically sign symptoms of pneumonia. Will DC antibiotics.-will place in tele bed for obs -Bronchodilators -As needed Mucinex -- urine culture multiple species.  COPD (chronic obstructive pulmonary disease) (HCC) -Bronchodilators   History of diastolic CHF: 2D echo on 04/25/2022 showed EF of 55-60% with grade 2 diastolic dysfunction.  Patient does not have leg edema.  CHF seem to be compensated. 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 Patient is weekend deconditioned. PT recommends rehab. Have discussed with patient and daughter will try to do PT OT on daily basis and if continues to improve then consider home with home health. Patient and daughter agreeable. In the meantime TOC will send out for rehab. Patient has COVID will need 10 day hospital stay. Will work on discharge planning pending clinical course--HH vs SNF  Family communication : daughter Consults : none CODE STATUS: DNR DVT Prophylaxis : enoxaparin Level of care: Telemetry Medical Status is:  Observation The patient remains OBS appropriate and will d/c before 2 midnights.    TOTAL TIME TAKING CARE OF THIS PATIENT: 35 minutes.  >50% time spent on counselling and coordination of care  Note: This dictation was prepared with Dragon dictation along with smaller phrase technology. Any transcriptional errors that result from this process are unintentional.  Enedina Finner  M.D    [facility]   CC: Primary care physician; System, Provider Not In

## 2023-07-19 NOTE — Progress Notes (Signed)
Patient was found supine on floor near bathroom in room by Jeanie Cooks, LPN.  This RN and Earnie Larsson, RN responded.  Vital signs were obtained with increased BP and HR.  Patient reports increased BLE knee pain; unable to rate on pain scale due to language barrier.  Patient assisted to bed with Max assist x3 for hygiene and bathing.  Perineal care, purewick change, clothing and bed linen was completed with total assist.  BP and HR stabilized.  Dr Enedina Finner, MD notified with no additional orders, continue to monitor.

## 2023-07-20 DIAGNOSIS — U071 COVID-19: Secondary | ICD-10-CM | POA: Diagnosis not present

## 2023-07-20 NOTE — TOC Progression Note (Signed)
Transition of Care [facility]) - Progression Note    Patient Details  Name: Brian [name] MRN: [id number] Date of Birth: 1941-03-15  Transition of [facility]) CM/SW Contact  Susa Simmonds, [place] Phone Number: 07/20/2023, 11:29 AM  Clinical Narrative:   CSW spoke with patients daughter Monica Talman, [phone]. Patients family is declining SNF and would like home health at discharge.          Expected Discharge Plan and Services                                               Social Determinants of Health (SDOH) Interventions SDOH Screenings   Food Insecurity: No Food Insecurity (07/18/2023)  Housing: Low Risk  (07/18/2023)  Transportation Needs: No Transportation Needs (07/18/2023)  Utilities: Not At Risk (07/18/2023)  Depression (PHQ2-9): Low Risk  (06/25/2022)  Tobacco Use: Low Risk  (07/16/2023)    Readmission Risk Interventions     No data to display

## 2023-07-20 NOTE — Plan of Care (Signed)
Patient slept all night. No distress noted. No complaints voiced. No attempts made to get out of bed alone. Will continue to monitor.   Problem: Education: Goal: Knowledge of General Education information will improve Description: Including pain rating scale, medication(s)/side effects and non-pharmacologic comfort measures Outcome: Progressing   Problem: Health Behavior/Discharge Planning: Goal: Ability to manage health-related needs will improve Outcome: Progressing   Problem: Clinical Measurements: Goal: Ability to maintain clinical measurements within normal limits will improve Outcome: Progressing Goal: Will remain free from infection Outcome: Progressing Goal: Diagnostic test results will improve Outcome: Progressing Goal: Respiratory complications will improve Outcome: Progressing Goal: Cardiovascular complication will be avoided Outcome: Progressing   Problem: Activity: Goal: Risk for activity intolerance will decrease Outcome: Progressing   Problem: Nutrition: Goal: Adequate nutrition will be maintained Outcome: Progressing   Problem: Coping: Goal: Level of anxiety will decrease Outcome: Progressing   Problem: Elimination: Goal: Will not experience complications related to bowel motility Outcome: Progressing Goal: Will not experience complications related to urinary retention Outcome: Progressing   Problem: Pain Managment: Goal: General experience of comfort will improve Outcome: Progressing   Problem: Safety: Goal: Ability to remain free from injury will improve Outcome: Progressing   Problem: Skin Integrity: Goal: Risk for impaired skin integrity will decrease Outcome: Progressing

## 2023-07-20 NOTE — Progress Notes (Addendum)
Physical Therapy Treatment Patient Details Name: Brian [name] MRN: [id number] DOB: 1940/11/29 Today's Date: 07/20/2023   History of Present Illness Brian [name] is an 82yoM who comes to [facility] on 9/18 after severe acute onset weakness,AMS. (+) COVID19. At baseline pt lives alone at [place] performs household distance AMB with RW, independent with ADL. PMH: PPM, HTN, HLD, COPD prn O2 at night, depression, GAD, HIV, BPH, OSA not using CPAP, stroke c chronic LLE pain, penetrating atherosclerotic ulcer of aortic arch    PT Comments  Pt ready for session.  Phone interpreter used but did not seem necessary as he answered short questions appropriately.  He is able to get to EOB with increased time and rails.  He is steady in sitting.  Does report some dizziness that clears in a short time.  He is able to stand with CGA x 1 and walk x 2 laps in room with cga/min a x 1 for walker navigation.  Does report some fatigue and takes a short rest on bed.  He is able to reach forward to his belonging bag where he gets out a brief and puts in on with min assist.  Bed does have a small amount of urine so new linens and gown given.  He stands to pull it up with supervision then completes another x 2 laps with similar assist.  Pt stated he has a walker at home.  Would benefit from +1 assist at home for general safety.  Reports no pain from unassisted fall yesterday.   If plan is discharge home, recommend the following: Assist for transportation;Help with stairs or ramp for entrance;Assistance with cooking/housework;A little help with walking and/or transfers;A little help with bathing/dressing/bathroom   Can travel by private vehicle        Equipment Recommendations  None recommended by PT    Recommendations for Other Services       Precautions / Restrictions Precautions Precautions: Fall Restrictions Weight Bearing Restrictions: No     Mobility  Bed Mobility Overal bed mobility: Needs Assistance Bed  Mobility: Supine to Sit, Sit to Supine     Supine to sit: Min assist Sit to supine: Contact guard assist   General bed mobility comments: light assist to EOB Patient Response: Cooperative  Transfers Overall transfer level: Needs assistance Equipment used: Rolling walker (2 wheels)   Sit to Stand: Contact guard assist, Min assist                Ambulation/Gait Ambulation/Gait assistance: Contact guard assist, Min assist Gait Distance (Feet): 50 Feet Assistive device: Rolling walker (2 wheels)   Gait velocity: decreased     General Gait Details: 50 x 2.  2 laps x 2 in room with short seated rest on bed.   Stairs             Wheelchair Mobility     Tilt Bed Tilt Bed Patient Response: Cooperative  Modified Rankin (Stroke Patients Only)       Balance Overall balance assessment: Needs assistance Sitting-balance support: Feet supported, Bilateral upper extremity supported Sitting balance-Leahy Scale: Good     Standing balance support: Bilateral upper extremity supported Standing balance-Leahy Scale: Fair Standing balance comment: +1 for balance and general safety                            Cognition Arousal: Alert Behavior During Therapy: WFL for tasks assessed/performed Overall Cognitive Status: Within Functional Limits for tasks assessed  General Comments: interpreter used via phone  - [employer] interpre[employer] but he answered questions before interpreter was able to speak.        Exercises      General Comments        Pertinent Vitals/Pain Pain Assessment Pain Assessment: No/denies pain Pain Intervention(s): Monitored during session    Home Living                          Prior Function            PT Goals (current goals can now be found in the care plan section) Progress towards PT goals: Progressing toward goals    Frequency    Min 1X/week      PT Plan       Co-evaluation              AM-PAC PT "6 Clicks" Mobility   Outcome Measure  Help needed turning from your back to your side while in a flat bed without using bedrails?: A Little Help needed moving from lying on your back to sitting on the side of a flat bed without using bedrails?: A Little Help needed moving to and from a bed to a chair (including a wheelchair)?: A Little Help needed standing up from a chair using your arms (e.g., wheelchair or bedside chair)?: A Little Help needed to walk in hospital room?: A Little Help needed climbing 3-5 steps with a railing? : A Little 6 Click Score: 18    End of Session Equipment Utilized During Treatment: Gait belt Activity Tolerance: Patient tolerated treatment well;Patient limited by fatigue Patient left: in bed;with call bell/phone within reach;with bed alarm set Nurse Communication: Mobility status PT Visit Diagnosis: Difficulty in walking, not elsewhere classified (R26.2);Other abnormalities of gait and mobility (R26.89);Repeated falls (R29.6);Muscle weakness (generalized) (M62.81)     Time: 4132-4401 PT Time Calculation (min) (ACUTE ONLY): 25 min  Charges:    $Gait Training: 23-37 mins PT General Charges $$ ACUTE PT VISIT: 1 Visit                   Danielle Dess, PTA 07/20/23, 2:41 PM

## 2023-07-20 NOTE — Progress Notes (Signed)
[facility]  - [place] at [facility]   PATIENT NAME: Brian [name]    MR#:  [id number]  DATE OF BIRTH:  02/12/1941  SUBJECTIVE:  granddaughter and daughter at bedside. Overall looks better feels better.   Patient had accidental fall yesterday without trauma. RN reported to be. Vitals were stable. Overall remains at baseline. Family would like patient to go home with home health VITALS:  Blood pressure 132/82, pulse 66, temperature 97.9 F (36.6 C), temperature source Oral, resp. rate 18, height 5\' 7"  (1.702 m), weight 66.4 kg, SpO2 100%.  PHYSICAL EXAMINATION:   GENERAL:  82 y.o.-year-old patient with no acute distress.  LUNGS: Normal breath sounds bilaterally, no wheezing CARDIOVASCULAR: S1, S2 normal. No murmur   ABDOMEN: Soft, nontender, nondistended. Bowel sounds present.  EXTREMITIES: No  edema b/l.    NEUROLOGIC: nonfocal  patient is alert and awake, weak   LABORATORY PANEL:  CBC Recent Labs  Lab 07/17/23 0359  WBC 9.6  HGB 15.4  HCT 43.2  PLT 191    Chemistries  Recent Labs  Lab 07/16/23 2250 07/17/23 0359  NA 135 137  K 3.9 3.6  CL 100 105  CO2 23 24  GLUCOSE 118* 120*  BUN 13 11  CREATININE 1.15 1.07  CALCIUM 9.0 8.6*  AST 23  --   ALT 14  --   ALKPHOS 46  --   BILITOT 1.3*  --    Cardiac Enzymes No results for input(s): "TROPONINI" in the last 168 hours. RADIOLOGY:  No results found.  Assessment and Plan  Brian [name] is a 82 y.o. 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   Chest x-ray negative. CT of head negative.   COVID-19 virus infection and sepsis: Patient has positive COVID 19, no new oxygen requirement.  Patient meets criteria for  sepsis with WBC 12.2, heart rate up to 105 and RR 24.  Chest x-ray negative for infiltration. -- Patient overall improving. Received three doses of IV Rocephin. White count normal. No clinically sign symptoms of pneumonia. Will DC antibiotics. -Bronchodilators -As needed Mucinex -- urine culture multiple species. -- Sepsis improved COPD (chronic obstructive pulmonary disease) (HCC) -Bronchodilators   History of diastolic CHF: 2D echo on 04/25/2022 showed EF of 55-60% with grade 2 diastolic dysfunction.  Patient does not have leg edema.  CHF seem to be compensated. 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 Family would consider to take patient home with home health. PT/OT has been following. Patient slowly improving. TOC has made arrangements for home health. Will discharge tomorrow. Family communication : daughter/granddaughter Consults : none CODE STATUS: DNR DVT Prophylaxis : enoxaparin Level of care: Telemetry Medical    TOTAL TIME TAKING CARE OF THIS PATIENT: 35 minutes.  >50% time spent on counselling and  coordination of care  Note: This dictation was prepared with Dragon dictation along with smaller phrase technology. Any transcriptional errors that result from this process are unintentional.  Enedina Finner M.D    [facility]   CC: Primary care physician; System, Provider Not In

## 2023-07-20 NOTE — Progress Notes (Signed)
   07/19/23 1645  What Happened  Was fall witnessed? No  Was patient injured? No  Patient found on floor  Found by Staff-comment Lacie Draft, LPN)  Stated prior activity other (comment) (Unknown due to language barrier)  Provider Notification  Provider Name/Title Dr Enedina Finner, MD  Date Provider Notified 07/19/23  Time Provider Notified 1721  Method of Notification Page  Notification Reason Fall  Provider response No new orders  Date of Provider Response 07/19/23  Time of Provider Response 1727  Follow Up  Family notified No - patient refusal  Time family notified 1700  Additional tests No  Simple treatment Other (comment) (None)  Progress note created (see row info) Yes  Adult Fall Risk Assessment  Risk Factor Category (scoring not indicated) Fall has occurred during this admission (document High fall risk)  Age 82  Fall History: Fall within 6 months prior to admission 5  Elimination; Bowel and/or Urine Incontinence 2  Elimination; Bowel and/or Urine Urgency/Frequency 0  Medications: includes PCA/Opiates, Anti-convulsants, Anti-hypertensives, Diuretics, Hypnotics, Laxatives, Sedatives, and Psychotropics 3  Patient Care Equipment 1  Mobility-Assistance 2  Mobility-Gait 2  Mobility-Sensory Deficit 0  Altered awareness of immediate physical environment 0  Impulsiveness 2  Lack of understanding of one's physical/cognitive limitations 4  Total Score 24  Patient Fall Risk Level High fall risk  Adult Fall Risk Interventions  Required Bundle Interventions *See Row Information* High fall risk - low, moderate, and high requirements implemented  Additional Interventions Room near nurses station;Use of appropriate toileting equipment (bedpan, BSC, etc.)  Fall intervention(s) refused/Patient educated regarding refusal Bed alarm;Nonskid socks  Screening for Fall Injury Risk (To be completed on HIGH fall risk patients) - Assessing Need for Floor Mats  Risk For Fall Injury- Criteria  for Floor Mats Previous fall this admission  Will Implement Floor Mats Yes  Pain Assessment  Pain Scale 0-10  Pain Type Acute pain  Pain Location Knee  Pain Orientation Left;Right  Pain Intervention(s) Other (Comment) (Patient refused intervention)  Neurological  Neuro (WDL) WDL  Level of Consciousness Alert  Musculoskeletal  Musculoskeletal (WDL) X  Assistive Device Front wheel walker  Generalized Weakness Yes  Weight Bearing Restrictions No  Integumentary  Integumentary (WDL) WDL

## 2023-07-20 NOTE — TOC Progression Note (Signed)
Transition of Care [facility]) - Progression Note    Patient Details  Name: Brian [name] MRN: [id number] Date of Birth: 10-May-1941  Transition of Care [facility]) CM/SW Contact  Susa Simmonds, [place] Phone Number: 07/20/2023, 12:00 PM  Clinical Narrative: CSW contacted Elnita Maxwell with [facility] who confirmed they could take patient for Home Health PT. They will go out to patients home on 07/22/23. Anticipated discharge for 07/21/23.          Expected Discharge Plan and Services                                               Social Determinants of Health (SDOH) Interventions SDOH Screenings   Food Insecurity: No Food Insecurity (07/18/2023)  Housing: Low Risk  (07/18/2023)  Transportation Needs: No Transportation Needs (07/18/2023)  Utilities: Not At Risk (07/18/2023)  Depression (PHQ2-9): Low Risk  (06/25/2022)  Tobacco Use: Low Risk  (07/16/2023)    Readmission Risk Interventions     No data to display

## 2023-07-21 ENCOUNTER — Other Ambulatory Visit (HOSPITAL_COMMUNITY): Payer: Self-pay

## 2023-07-21 ENCOUNTER — Other Ambulatory Visit: Payer: Self-pay

## 2023-07-21 DIAGNOSIS — U071 COVID-19: Secondary | ICD-10-CM | POA: Diagnosis not present

## 2023-07-21 NOTE — Progress Notes (Signed)
Physical Therapy Treatment Patient Details Name: Brian [name] MRN: [id number] DOB: [birth year] Today's Date: 07/21/2023   History of Present Illness Brian [name] is an 82yoM who comes to [facility] on 9/18 after severe acute onset weakness,AMS. (+) COVID19. At baseline pt lives alone at [facility] performs household distance AMB with RW, independent with ADL. PMH: PPM, HTN, HLD, COPD prn O2 at night, depression, GAD, HIV, BPH, OSA not using CPAP, stroke c chronic LLE pain, penetrating atherosclerotic ulcer of aortic arch    PT Comments  Pt with no assist for bed mobility this am.  Does take increased time and uses rails.  He stands and is able to walk x 3 laps in room with RW and cga x 1.  He does demonstrate overall improved gait with increased step height and length and overall improved posture and knees more extended.  Daughter in room and confirms RW at home.  Encouraged +1 assist at home initially for safety and daughter voiced understanding.   If plan is discharge home, recommend the following: Assist for transportation;Help with stairs or ramp for entrance;Assistance with cooking/housework;A little help with walking and/or transfers;A little help with bathing/dressing/bathroom   Can travel by private vehicle        Equipment Recommendations  None recommended by PT    Recommendations for Other Services       Precautions / Restrictions Precautions Precautions: Fall Restrictions Weight Bearing Restrictions: No     Mobility  Bed Mobility Overal bed mobility: Needs Assistance Bed Mobility: Supine to Sit, Sit to Supine     Supine to sit: Supervision Sit to supine: Supervision     Patient Response: Cooperative  Transfers Overall transfer level: Needs assistance Equipment used: Rolling walker (2 wheels) Transfers: Sit to/from Stand Sit to Stand: Supervision           General transfer comment: does have poor hand placements but no assist needed    Ambulation/Gait    Gait Distance (Feet): 60 Feet Assistive device: Rolling walker (2 wheels) Gait Pattern/deviations: Step-through pattern, Decreased step length - right, Decreased step length - left Gait velocity: decreased     General Gait Details: x 3 laps in room with overall improved gait   Stairs             Wheelchair Mobility     Tilt Bed Tilt Bed Patient Response: Cooperative  Modified Rankin (Stroke Patients Only)       Balance Overall balance assessment: Needs assistance Sitting-balance support: Feet supported Sitting balance-Leahy Scale: Good     Standing balance support: Bilateral upper extremity supported Standing balance-Leahy Scale: Fair Standing balance comment: generally improved today but recommended to daughter +1 at home initially for safety                            Cognition Arousal: Alert Behavior During Therapy: [facility] for tasks assessed/performed Overall Cognitive Status: Within Functional Limits for tasks assessed                                 General Comments: daughter in for session and interpets as needed        Exercises      General Comments        Pertinent Vitals/Pain Pain Assessment Pain Assessment: No/denies pain    Home Living  Prior Function            PT Goals (current goals can now be found in the care plan section) Progress towards PT goals: Progressing toward goals    Frequency    Min 1X/week      PT Plan      Co-evaluation              AM-PAC PT "6 Clicks" Mobility   Outcome Measure  Help needed turning from your back to your side while in a flat bed without using bedrails?: A Little Help needed moving from lying on your back to sitting on the side of a flat bed without using bedrails?: A Little Help needed moving to and from a bed to a chair (including a wheelchair)?: A Little Help needed standing up from a chair using your arms (e.g.,  wheelchair or bedside chair)?: A Little Help needed to walk in hospital room?: A Little Help needed climbing 3-5 steps with a railing? : A Little 6 Click Score: 18    End of Session Equipment Utilized During Treatment: Gait belt Activity Tolerance: Patient tolerated treatment well;Patient limited by fatigue Patient left: in bed;with call bell/phone within reach;with bed alarm set Nurse Communication: Mobility status PT Visit Diagnosis: Difficulty in walking, not elsewhere classified (R26.2);Other abnormalities of gait and mobility (R26.89);Repeated falls (R29.6);Muscle weakness (generalized) (M62.81)     Time: 2952-8413 PT Time Calculation (min) (ACUTE ONLY): 19 min  Charges:    $Gait Training: 8-22 mins PT General Charges $$ ACUTE PT VISIT: 1 Visit                   Danielle Dess, PTA 07/21/23, 9:05 AM

## 2023-07-21 NOTE — Discharge Summary (Signed)
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    Chest x-ray negative. CT of head negative.    COVID-19 virus infection and sepsis: Patient has positive COVID 19, no new oxygen requirement.  Patient meets criteria for sepsis with WBC 12.2, heart rate up to 105 and RR 24.  Chest x-ray negative for infiltration. -- Patient overall improving. Received three doses of IV Rocephin. White count normal. No clinically sign symptoms of pneumonia. Will DC antibiotics. -Bronchodilators -As needed Mucinex -- urine culture multiple species. -- Sepsis improved  COPD (chronic obstructive pulmonary disease) (HCC) -Bronchodilators   History of diastolic CHF: 2D echo on 04/25/2022 showed EF of 55-60% with grade 2 diastolic dysfunction.  Patient does not have leg edema.  CHF seem to be compensated. 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 -Lipitor, Zetia, aspirin   HIV (human immunodeficiency virus infection) (HCC): CD4 was 401, viral load <20 on 06/25/2022 -Continue homeTriumeq   BPH (benign prostatic hyperplasia) -Dutasteride and Flomax   Depression with anxiety -Continue home medications   Overall improving.d/ch ome  Family communication : daughter aware of d/c plans Consults : none CODE STATUS: DNR DVT Prophylaxis : enoxaparin    Diet recommendation:  Discharge Diet Orders (From admission, onward)     Start     Ordered   07/21/23 0000  Diet - low sodium heart healthy        07/21/23 1023            DISCHARGE MEDICATION: Allergies as of 07/21/2023   No Known Allergies      Medication List     STOP taking these medications    amLODipine 10 MG tablet Commonly known as: NORVASC   atorvastatin 40 MG tablet Commonly known as: LIPITOR   pregabalin 50 MG capsule Commonly known as: LYRICA   triamcinolone cream 0.1 % Commonly known as: KENALOG       TAKE these medications    alendronate 70 MG tablet Commonly known as: FOSAMAX Take 70 mg by mouth once a week. Take on Saturday.   azelastine 0.05 % ophthalmic solution Commonly known as: OPTIVAR Apply 1 drop to eye 2 (two) times daily.   clonazePAM 0.5 MG tablet Commonly known as: KLONOPIN Take 1 tablet (0.5 mg total) by mouth 2 (two)  times daily as needed for up to 5 days for anxiety.   clopidogrel 75 MG tablet Commonly known as: PLAVIX Take 75 mg by mouth daily.   DULoxetine 30 MG capsule Commonly known as: CYMBALTA Take 30 mg by mouth daily.   dutasteride 0.5 MG capsule Commonly known as: AVODART Take 1 capsule (0.5 mg total) by mouth daily.   ezetimibe 10 MG tablet Commonly known as: ZETIA Take 10 mg by mouth daily.   loratadine 10 MG  tablet Commonly known as: CLARITIN Take 10 mg by mouth daily.   metoprolol succinate 25 MG 24 hr tablet Commonly known as: TOPROL-XL TAKE 1 TABLET (25 MG TOTAL) BY MOUTH IN THE MORNING AND AT BEDTIME.   pantoprazole 40 MG tablet Commonly known as: PROTONIX Take 1 tablet (40 mg total) by mouth daily.   simethicone 80 MG chewable tablet Commonly known as: MYLICON Chew 1 tablet (80 mg total) by mouth every 6 (six) hours as needed for flatulence.   tamsulosin 0.4 MG Caps capsule Commonly known as: FLOMAX Take 1 capsule (0.4 mg total) by mouth daily.   traMADol-acetaminophen 37.5-325 MG tablet Commonly known as: ULTRACET Take 1 tablet by mouth daily as needed.   Triumeq 600-50-300 MG tablet Generic drug: abacavir-dolutegravir-lamiVUDine T[place] 1 tableta por va oral diariamente. (Take 1 tablet by mouth daily.)   Ventolin HFA 108 (90 Base) MCG/ACT inhaler Generic drug: albuterol Inhale 2 puffs into the lungs 4 (four) times daily as needed. For shortness of breath and/or wheezing.   Vitamin D (Ergocalciferol) 1.25 MG (50000 UNIT) Caps capsule Commonly known as: DRISDOL Take 50,000 Units by mouth once a week.        Follow-up Information     Josefina Do., MD. Schedule an appointment as soon as possible for a visit in 1 week(s).   Specialty: Family Medicine Why: [place] f/u Contact information: [address] [place] [address] [phone]                Discharge Exam: Filed Weights   07/18/23 1346 07/19/23 0500 07/21/23 0456  Weight: 66.5 kg 66.4 kg 64.1 kg   GENERAL:  82 y.o.-year-old patient with no acute distress.  LUNGS: Normal breath sounds bilaterally, no wheezing CARDIOVASCULAR: S1, S2 normal. No murmur   ABDOMEN: Soft, nontender, nondistended. Bowel sounds present.  EXTREMITIES: No  edema b/l.    NEUROLOGIC: nonfocal  patient is alert and awake, weak  Condition at discharge: fair  The results of significant diagnostics from this  hospitalization (including imaging, microbiology, ancillary and laboratory) are listed below for reference.   Imaging Studies: DG Chest Portable 1 View  Result Date: 07/16/2023 CLINICAL DATA:  Increasing weakness and confusion since this morning, cough EXAM: PORTABLE CHEST 1 VIEW COMPARISON:  07/13/2021 FINDINGS: Single frontal view of the chest demonstrates stable dual lead pacer. The cardiac silhouette is enlarged but stable. Prominent vascular shadow in the right paratracheal region unchanged. No airspace disease, effusion, or pneumothorax. No acute bony abnormalities. IMPRESSION: 1. Stable chest, no acute intrathoracic process. Electronically Signed   By: Sharlet Salina M.D.   On: 07/16/2023 22:55   CT HEAD WO CONTRAST ( )  Result Date: 07/16/2023 CLINICAL DATA:  Increasing weakness and confusion since this morning, generalized body aches and cough EXAM: CT HEAD WITHOUT CONTRAST TECHNIQUE: Contiguous axial images were obtained from the base of the skull through the vertex without intravenous contrast. RADIATION DOSE REDUCTION: This exam was performed according to the departmental dose-optimization program which includes automated  exposure control, adjustment of the mA and/or kV according to patient size and/or use of iterative reconstruction technique. COMPARISON:  04/25/2022 FINDINGS: Brain: Chronic small vessel ischemic changes are seen within the periventricular white matter, bilateral basal ganglia, and right anterior pons. No evidence of acute infarct or hemorrhage. Lateral ventricles and remaining midline structures are unremarkable. No acute extra-axial fluid collections. No mass effect. Vascular: No hyperdense vessel or unexpected calcification. Skull: Normal. Negative for fracture or focal lesion. Sinuses/Orbits: No acute finding. Other: None. IMPRESSION: 1. No acute intracranial process. 2. Chronic small vessel ischemic changes as above. Electronically Signed   By: Sharlet Salina M.D.   On:  07/16/2023 22:53    Microbiology: Results for orders placed or performed during the hospital encounter of 07/16/23  SARS Coronavirus 2 by RT PCR (hospital order, performed in [facility] hospital lab) *cepheid single result test* Anterior Nasal Swab     Status: Abnormal   Collection Time: 07/16/23 10:50 PM   Specimen: Anterior Nasal Swab  Result Value Ref Range Status   SARS Coronavirus 2 by RT PCR POSITIVE (A) NEGATIVE Final    Comment: (NOTE) SARS-CoV-2 target nucleic acids are DETECTED  SARS-CoV-2 RNA is generally detectable in upper respiratory specimens  during the acute phase of infection.  Positive results are indicative  of the presence of the identified virus, but do not rule out bacterial infection or co-infection with other pathogens not detected by the test.  Clinical correlation with patient history and  other diagnostic information is necessary to determine patient infection status.  The expected result is negative.  Fact Sheet for Patients:   RoadLapTop.co.za   Fact Sheet for Healthcare Providers:   http://kim-miller.com/    This test is not yet approved or cleared by the [place] FDA and  has been authorized for detection and/or diagnosis of SARS-CoV-2 by FDA under an Emergency Use Authorization (EUA).  This EUA will remain in effect (meaning this test can be used) for the duration of  the COVID-19 declaration under Section 564(b)(1)  of the Act, 21 U.S.C. section 360-bbb-3(b)(1), unless the authorization is terminated or revoked sooner.   Performed at [facility], [address] [place], [place] [address]   Culture, blood (x 2)     Status: None (Preliminary result)   Collection Time: 07/17/23  1:08 AM   Specimen: BLOOD  Result Value Ref Range Status   Specimen Description BLOOD RIGHT ARM  Final   Special Requests   Final    BOTTLES DRAWN AEROBIC AND ANAEROBIC Blood Culture results may not be  optimal due to an excessive volume of blood received in culture bottles   Culture   Final    NO GROWTH 4 DAYS Performed at [facility], [address], [place], [place] [address]    Report Status PENDING  Incomplete  Culture, blood (x 2)     Status: None (Preliminary result)   Collection Time: 07/17/23  1:08 AM   Specimen: BLOOD  Result Value Ref Range Status   Specimen Description BLOOD LEFT ARM  Final   Special Requests   Final    BOTTLES DRAWN AEROBIC AND ANAEROBIC Blood Culture results may not be optimal due to an excessive volume of blood received in culture bottles   Culture   Final    NO GROWTH 4 DAYS Performed at [facility], [address], [place], [place] [address]    Report Status PENDING  Incomplete  Urine Culture  Status: Abnormal   Collection Time: 07/17/23  3:57 AM   Specimen: Urine, Random  Result Value Ref Range Status   Specimen Description   Final    URINE, RANDOM Performed at [facility], [address] [place], [place] [address]    Special Requests   Final    NONE Reflexed from [id number] Performed at [facility], [address] [place], [place] [address]    Culture MULTIPLE SPECIES PRESENT, SUGGEST RECOLLECTION (A)  Final   Report Status 07/18/2023 FINAL  Final    Labs: CBC: Recent Labs  Lab 07/16/23 2250 07/17/23 0359  WBC 12.2* 9.6  HGB 16.1 15.4  HCT 44.7 43.2  MCV 85.1 85.9  PLT 219 191   Basic Metabolic Panel: Recent Labs  Lab 07/16/23 2250 07/17/23 0359  NA 135 137  K 3.9 3.6  CL 100 105  CO2 23 24  GLUCOSE 118* 120*  BUN 13 11  CREATININE 1.15 1.07  CALCIUM 9.0 8.6*   Liver Function Tests: Recent Labs  Lab 07/16/23 2250  AST 23  ALT 14  ALKPHOS 46  BILITOT 1.3*  PROT 7.9  ALBUMIN 4.0   Discharge time spent: greater than 30 minutes.  Signed: Enedina Finner, MD [facility] 07/21/2023

## 2023-07-21 NOTE — TOC Transition Note (Signed)
Transition of Care [facility]) - CM/SW Discharge Note   Patient Details  Name: Brian [name] MRN: [id number] Date of Birth: [birth year]  Transition of Care [facility]) CM/SW Contact:  Allena Katz, LCSW Phone Number: 07/21/2023, 11:07 AM   Clinical Narrative:   Pt discharging home with [facility]. Cheryl with [facility] notified.     Final next level of care: Home w Home Health Services Barriers to Discharge: Barriers Resolved   Patient Goals and CMS Choice CMS Medicare.gov Compare Post Acute Care list provided to:: Patient Choice offered to / list presented to : Patient  Discharge Placement                  Patient to be transferred to facility by: family   Patient and family notified of of transfer: 07/21/23  Discharge Plan and Services Additional resources added to the After Visit Summary for                            [facility] Arranged: PT, OT HH Agency: [facility]        Social Determinants of Health (SDOH) Interventions SDOH Screenings   Food Insecurity: No Food Insecurity (07/18/2023)  Housing: Low Risk  (07/18/2023)  Transportation Needs: No Transportation Needs (07/18/2023)  Utilities: Not At Risk (07/18/2023)  Depression (PHQ2-9): Low Risk  (06/25/2022)  Tobacco Use: Low Risk  (07/16/2023)     Readmission Risk Interventions     No data to display

## 2023-07-21 NOTE — Plan of Care (Signed)
Problem: Activity: Goal: Risk for activity intolerance will decrease Outcome: Progressing   Problem: Nutrition: Goal: Adequate nutrition will be maintained Outcome: Progressing   Problem: Coping: Goal: Level of anxiety will decrease Outcome: Progressing   Problem: Elimination: Goal: Will not experience complications related to bowel motility Outcome: Progressing Goal: Will not experience complications related to urinary retention Outcome: Progressing   Problem: Pain Managment: Goal: General experience of comfort will improve Outcome: Progressing   Problem: Skin Integrity: Goal: Risk for impaired skin integrity will decrease Outcome: Progressing

## 2023-07-22 ENCOUNTER — Other Ambulatory Visit: Payer: Self-pay

## 2023-07-22 ENCOUNTER — Other Ambulatory Visit: Payer: Self-pay | Admitting: Infectious Diseases

## 2023-07-22 DIAGNOSIS — B20 Human immunodeficiency virus [HIV] disease: Secondary | ICD-10-CM

## 2023-07-22 LAB — CULTURE, BLOOD (ROUTINE X 2)
Culture: NO GROWTH
Culture: NO GROWTH

## 2023-07-22 MED ORDER — TRIUMEQ 600-50-300 MG PO TABS
1.0000 | ORAL_TABLET | Freq: Every day | ORAL | 5 refills | Status: DC
  Filled 2023-07-22: qty 30, 30d supply, fill #0
  Filled 2023-08-28: qty 30, 30d supply, fill #1
  Filled 2023-09-30: qty 30, 30d supply, fill #2
  Filled 2023-10-16: qty 30, 30d supply, fill #3
  Filled 2023-11-12: qty 30, 30d supply, fill #4
  Filled 2023-12-11: qty 30, 30d supply, fill #5

## 2023-07-22 NOTE — Progress Notes (Signed)
Specialty Pharmacy Refill Coordination Note  Brian [name] is a 82 y.o. male contacted today regarding refills of specialty medication(s) Abacavir-Dolutegravir-Lamivud .  Patient requested Delivery  on 07/25/23  to verified address [address]  [place] [id number]  Pending Refill Request, Medication will be filled on 07/24/23.

## 2023-07-23 ENCOUNTER — Other Ambulatory Visit: Payer: Self-pay

## 2023-07-25 ENCOUNTER — Other Ambulatory Visit: Payer: Self-pay

## 2023-08-13 ENCOUNTER — Other Ambulatory Visit: Payer: Self-pay

## 2023-08-18 ENCOUNTER — Other Ambulatory Visit: Payer: Self-pay

## 2023-08-19 ENCOUNTER — Other Ambulatory Visit (HOSPITAL_COMMUNITY): Payer: Self-pay

## 2023-08-28 ENCOUNTER — Other Ambulatory Visit: Payer: Self-pay

## 2023-08-28 NOTE — Progress Notes (Signed)
Specialty Pharmacy Ongoing Clinical Assessment Note  Brian [name] is a 82 y.o. male who is being followed by the specialty pharmacy service for RxSp HIV   Patient's specialty medication(s) reviewed today: Abacavir-Dolutegravir-Lamivud   Missed doses in the last 4 weeks: 0   Patient/Caregiver did not have any additional questions or concerns.   Therapeutic benefit summary: Unable to assess   Adverse events/side effects summary: No adverse events/side effects   Patient's therapy is appropriate to: Continue    Goals Addressed             This Visit's Progress    Achieve Undetectable HIV Viral Load < 20       Patient is on track. Patient will maintain adherence         Follow up:  6 months  [name] Specialty Pharmacist

## 2023-08-28 NOTE — Progress Notes (Signed)
Specialty Pharmacy Refill Coordination Note  Brian [name] is a 82 y.o. male contacted today regarding refills of specialty medication(s) Abacavir-Dolutegravir-Lamivud   Patient requested Delivery   Delivery date: 09/01/23   Verified address: [address]   Medication will be filled on 08/29/23.

## 2023-09-19 ENCOUNTER — Other Ambulatory Visit: Payer: Self-pay

## 2023-09-22 ENCOUNTER — Other Ambulatory Visit: Payer: Self-pay

## 2023-09-30 ENCOUNTER — Other Ambulatory Visit: Payer: Self-pay

## 2023-09-30 ENCOUNTER — Other Ambulatory Visit (HOSPITAL_COMMUNITY): Payer: Self-pay

## 2023-09-30 NOTE — Progress Notes (Signed)
Specialty Pharmacy Refill Coordination Note  Brian [name] is a 82 y.o. male contacted today regarding refills of specialty medication(s) Abacavir-Dolutegravir-Lamivud   Patient requested Delivery   Delivery date: 10/01/23   Verified address: [address] [place] [address]   Medication will be filled on 09/30/23.

## 2023-10-16 ENCOUNTER — Other Ambulatory Visit: Payer: Self-pay

## 2023-10-16 NOTE — Progress Notes (Signed)
Specialty Pharmacy Refill Coordination Note  Brian [name] is a 82 y.o. male contacted today regarding refills of specialty medication(s) Abacavir-Dolutegravir-Lamivud (Triumeq)   Patient requested (Patient-Rptd) Delivery   Delivery date: (Patient-Rptd) 10/27/23   Verified address: (Patient-Rptd) [address] [place], [place] [address]   Medication will be filled on 12.27.24.

## 2023-11-12 ENCOUNTER — Other Ambulatory Visit: Payer: Self-pay

## 2023-11-12 NOTE — Progress Notes (Signed)
 Specialty Pharmacy Refill Coordination Note  Brian [name] is a 83 y.o. male contacted today regarding refills of specialty medication(s) Abacavir -Dolutegravir -Lamivud (Triumeq )   Patient requested Delivery   Delivery date: 11/19/23   Verified address: [address]   [place]  [place] [address]   Medication will be filled on 11/18/23.

## 2023-11-18 ENCOUNTER — Other Ambulatory Visit: Payer: Self-pay

## 2023-11-27 ENCOUNTER — Inpatient Hospital Stay
Admission: EM | Admit: 2023-11-27 | Discharge: 2023-12-02 | DRG: 689 | Disposition: A | Discharge status: Skilled Nursing Facility | Payer: 59 | Attending: Internal Medicine | Admitting: Internal Medicine

## 2023-11-27 ENCOUNTER — Other Ambulatory Visit: Payer: Self-pay

## 2023-11-27 ENCOUNTER — Emergency Department: Payer: 59

## 2023-11-27 ENCOUNTER — Encounter: Payer: Self-pay | Admitting: Intensive Care

## 2023-11-27 DIAGNOSIS — I119 Hypertensive heart disease without heart failure: Secondary | ICD-10-CM | POA: Diagnosis present

## 2023-11-27 DIAGNOSIS — I69331 Monoplegia of upper limb following cerebral infarction affecting right dominant side: Secondary | ICD-10-CM

## 2023-11-27 DIAGNOSIS — J449 Chronic obstructive pulmonary disease, unspecified: Secondary | ICD-10-CM | POA: Diagnosis not present

## 2023-11-27 DIAGNOSIS — Z79899 Other long term (current) drug therapy: Secondary | ICD-10-CM | POA: Diagnosis not present

## 2023-11-27 DIAGNOSIS — I10 Essential (primary) hypertension: Secondary | ICD-10-CM | POA: Diagnosis present

## 2023-11-27 DIAGNOSIS — B9620 Unspecified Escherichia coli [E. coli] as the cause of diseases classified elsewhere: Secondary | ICD-10-CM | POA: Diagnosis present

## 2023-11-27 DIAGNOSIS — Z21 Asymptomatic human immunodeficiency virus [HIV] infection status: Secondary | ICD-10-CM | POA: Diagnosis present

## 2023-11-27 DIAGNOSIS — Z7983 Long term (current) use of bisphosphonates: Secondary | ICD-10-CM

## 2023-11-27 DIAGNOSIS — G9341 Metabolic encephalopathy: Secondary | ICD-10-CM

## 2023-11-27 DIAGNOSIS — R531 Weakness: Secondary | ICD-10-CM | POA: Diagnosis present

## 2023-11-27 DIAGNOSIS — Z7902 Long term (current) use of antithrombotics/antiplatelets: Secondary | ICD-10-CM | POA: Diagnosis not present

## 2023-11-27 DIAGNOSIS — Z950 Presence of cardiac pacemaker: Secondary | ICD-10-CM | POA: Diagnosis not present

## 2023-11-27 DIAGNOSIS — J4489 Other specified chronic obstructive pulmonary disease: Secondary | ICD-10-CM | POA: Diagnosis present

## 2023-11-27 DIAGNOSIS — N179 Acute kidney failure, unspecified: Secondary | ICD-10-CM | POA: Diagnosis present

## 2023-11-27 DIAGNOSIS — M25562 Pain in left knee: Secondary | ICD-10-CM | POA: Diagnosis present

## 2023-11-27 DIAGNOSIS — Z87891 Personal history of nicotine dependence: Secondary | ICD-10-CM

## 2023-11-27 DIAGNOSIS — N390 Urinary tract infection, site not specified: Principal | ICD-10-CM | POA: Insufficient documentation

## 2023-11-27 DIAGNOSIS — J101 Influenza due to other identified influenza virus with other respiratory manifestations: Secondary | ICD-10-CM | POA: Diagnosis present

## 2023-11-27 DIAGNOSIS — S2232XA Fracture of one rib, left side, initial encounter for closed fracture: Secondary | ICD-10-CM

## 2023-11-27 DIAGNOSIS — N3000 Acute cystitis without hematuria: Secondary | ICD-10-CM

## 2023-11-27 DIAGNOSIS — N400 Enlarged prostate without lower urinary tract symptoms: Secondary | ICD-10-CM | POA: Diagnosis present

## 2023-11-27 DIAGNOSIS — Z8249 Family history of ischemic heart disease and other diseases of the circulatory system: Secondary | ICD-10-CM

## 2023-11-27 DIAGNOSIS — W19XXXA Unspecified fall, initial encounter: Secondary | ICD-10-CM | POA: Diagnosis present

## 2023-11-27 DIAGNOSIS — R197 Diarrhea, unspecified: Secondary | ICD-10-CM | POA: Diagnosis present

## 2023-11-27 DIAGNOSIS — B961 Klebsiella pneumoniae [K. pneumoniae] as the cause of diseases classified elsewhere: Secondary | ICD-10-CM | POA: Diagnosis present

## 2023-11-27 DIAGNOSIS — Z1152 Encounter for screening for COVID-19: Secondary | ICD-10-CM

## 2023-11-27 DIAGNOSIS — F32A Depression, unspecified: Secondary | ICD-10-CM | POA: Diagnosis present

## 2023-11-27 DIAGNOSIS — Z66 Do not resuscitate: Secondary | ICD-10-CM | POA: Diagnosis present

## 2023-11-27 DIAGNOSIS — E785 Hyperlipidemia, unspecified: Secondary | ICD-10-CM | POA: Diagnosis present

## 2023-11-27 DIAGNOSIS — Y92039 Unspecified place in apartment as the place of occurrence of the external cause: Secondary | ICD-10-CM

## 2023-11-27 HISTORY — DX: Cerebral infarction, unspecified: I63.9

## 2023-11-27 HISTORY — DX: Benign prostatic hyperplasia without lower urinary tract symptoms: N40.0

## 2023-11-27 LAB — URINALYSIS, ROUTINE W REFLEX MICROSCOPIC
Bilirubin Urine: NEGATIVE
Glucose, UA: NEGATIVE mg/dL
Hgb urine dipstick: NEGATIVE
Ketones, ur: NEGATIVE mg/dL
Nitrite: NEGATIVE
Protein, ur: 100 mg/dL — AB
Specific Gravity, Urine: 1.013 (ref 1.005–1.030)
Squamous Epithelial / HPF: 0 /[HPF] (ref 0–5)
WBC, UA: >50 WBC/hpf (ref 0–5)
pH: 5 (ref 5.0–8.0)

## 2023-11-27 LAB — RESP PANEL BY RT-PCR (RSV, FLU A&B, COVID)  RVPGX2
Influenza A by PCR: NEGATIVE
Influenza B by PCR: NEGATIVE
Resp Syncytial Virus by PCR: NEGATIVE
SARS Coronavirus 2 by RT PCR: NEGATIVE

## 2023-11-27 LAB — CBC
HCT: 48.4 % (ref 39.0–52.0)
Hemoglobin: 17.3 g/dL — ABNORMAL HIGH (ref 13.0–17.0)
MCH: 31.5 pg (ref 26.0–34.0)
MCHC: 35.7 g/dL (ref 30.0–36.0)
MCV: 88 fL (ref 80.0–100.0)
Platelets: 282 10*3/uL (ref 150–400)
RBC: 5.5 MIL/uL (ref 4.22–5.81)
RDW: 13.4 % (ref 11.5–15.5)
WBC: 11.1 10*3/uL — ABNORMAL HIGH (ref 4.0–10.5)
nRBC: 0 % (ref 0.0–0.2)

## 2023-11-27 LAB — BASIC METABOLIC PANEL
Anion gap: 13 (ref 5–15)
BUN: 20 mg/dL (ref 8–23)
CO2: 22 mmol/L (ref 22–32)
Calcium: 9.6 mg/dL (ref 8.9–10.3)
Chloride: 105 mmol/L (ref 98–111)
Creatinine, Ser: 1.29 mg/dL — ABNORMAL HIGH (ref 0.61–1.24)
GFR, Estimated: 55 mL/min — ABNORMAL LOW (ref >60)
Glucose, Bld: 148 mg/dL — ABNORMAL HIGH (ref 70–99)
Potassium: 3.7 mmol/L (ref 3.5–5.1)
Sodium: 140 mmol/L (ref 135–145)

## 2023-11-27 MED ORDER — ABACAVIR-DOLUTEGRAVIR-LAMIVUD 600-50-300 MG PO TABS
1.0000 | ORAL_TABLET | Freq: Every day | ORAL | Status: DC
  Administered 2023-11-28 – 2023-12-02 (×5): 1 via ORAL
  Filled 2023-11-27 (×5): qty 1

## 2023-11-27 MED ORDER — SODIUM CHLORIDE 0.9 % IV BOLUS
1000.0000 mL | Freq: Once | INTRAVENOUS | Status: AC
  Administered 2023-11-27: 1000 mL via INTRAVENOUS

## 2023-11-27 MED ORDER — SODIUM CHLORIDE 0.9 % IV SOLN
1.0000 g | Freq: Once | INTRAVENOUS | Status: AC
  Administered 2023-11-27: 1 g via INTRAVENOUS
  Filled 2023-11-27: qty 10

## 2023-11-27 MED ORDER — MORPHINE SULFATE (PF) 4 MG/ML IV SOLN
4.0000 mg | Freq: Once | INTRAVENOUS | Status: AC
  Administered 2023-11-27: 4 mg via INTRAVENOUS
  Filled 2023-11-27: qty 1

## 2023-11-27 NOTE — ED Provider Triage Note (Signed)
Emergency Medicine Provider Triage Evaluation Note  Brian [name] , a 83 y.o. male  was evaluated in triage.  Pt complains of weakness x 2 days and fall today. Now complaining of left hip, knee, ribs, and headache. Patient lives alone. Typically independent of ADLs.  Physical Exam  BP (!) 115/94 (BP Location: Right Arm)   Pulse 100   Temp 97.6 F (36.4 C) (Oral)   Resp 16   SpO2 99%  Gen:   Awake, no distress   Resp:  Normal effort  MSK:   Moves extremities without difficulty  Other:    Medical Decision Making  Medically screening exam initiated at 3:29 PM.  Appropriate orders placed.  Savion Washam was informed that the remainder of the evaluation will be completed by another provider, this initial triage assessment does not replace that evaluation, and the importance of remaining in the ED until their evaluation is complete.     [name], FNP 11/27/23 1606

## 2023-11-27 NOTE — ED Triage Notes (Signed)
Patient arrived by EMS from home. Lives alone. Son reports patient has not been able to ambulate in three days. Also c/o congestion in lungs. Son reports patient fell today onto left rib cage and left side of head.   C/o left knee and hip pain

## 2023-11-27 NOTE — ED Triage Notes (Signed)
Arrives from [facility]. Patient lives alone. Son states he has been more altered recently than normal. Unknown last seen well.  When son found patient today patient had fallen.  C/O left knee pain and right flank pain.  93-97% 147/94 99 ax Cbg [phone] P

## 2023-11-27 NOTE — ED Provider Notes (Signed)
[facility] Provider Note    Event Date/Time   First MD Initiated Contact with Patient 11/27/23 2111     (approximate)   History   Chief Complaint Weakness   HPI  Brian [name] is a 83 y.o. male with past medical history of hypertension, HIV, stroke, and COPD who presents to the ED complaining of generalized weakness.  History is provided by daughter-in-law at bedside, who states that patient has been increasingly weak with confusion for the past 7 days.  Patient has not complained of anything other than feeling weak and tired, but family states that he is now having difficulty walking.  He typically lives alone and has no difficulty caring for himself.  He has not complained of any fevers, cough, chest pain, shortness of breath, nausea, vomiting, diarrhea, or dysuria.  Patient did have a fall earlier this evening while trying to walk to the bathroom, struck his head but did not lose consciousness.  He does not take any blood thinners.     Physical Exam   Triage Vital Signs: ED Triage Vitals  Encounter Vitals Group     BP 11/27/23 1527 (!) 115/94     Systolic BP Percentile --      Diastolic BP Percentile --      Pulse Rate 11/27/23 1527 100     Resp 11/27/23 1527 16     Temp 11/27/23 1527 97.6 F (36.4 C)     Temp Source 11/27/23 1527 Oral     SpO2 11/27/23 1527 99 %     Weight 11/27/23 1529 170 lb (77.1 kg)     Height 11/27/23 1529 5\' 6"  (1.676 m)     Head Circumference --      Peak Flow --      Pain Score 11/27/23 1529 8     Pain Loc --      Pain Education --      Exclude from Growth Chart --     Most recent vital signs: Vitals:   11/27/23 1527 11/27/23 2156  BP: (!) 115/94 (!) 112/96  Pulse: 100 (!) 101  Resp: 16 18  Temp: 97.6 F (36.4 C) 97.7 F (36.5 C)  SpO2: 99% 99%    Constitutional: Alert and oriented. Eyes: Conjunctivae are normal. Head: Atraumatic. Nose: No congestion/rhinnorhea. Mouth/Throat: Mucous membranes are moist.   Neck: No midline cervical spine tenderness to palpation. Cardiovascular: Normal rate, regular rhythm. Grossly normal heart sounds.  2+ radial pulses bilaterally. Respiratory: Normal respiratory effort.  No retractions. Lungs CTAB.  Left chest wall tenderness to palpation noted. Gastrointestinal: Soft and nontender. No distention. Musculoskeletal: Diffuse tenderness to palpation of left knee and hip, no upper extremity bony tenderness to palpation. Neurologic:  Normal speech and language. No gross focal neurologic deficits are appreciated.    ED Results / Procedures / Treatments   Labs (all labs ordered are listed, but only abnormal results are displayed) Labs Reviewed  BASIC METABOLIC PANEL - Abnormal; Notable for the following components:      Result Value   Glucose, Bld 148 (*)    Creatinine, Ser 1.29 (*)    GFR, Estimated 55 (*)    All other components within normal limits  CBC - Abnormal; Notable for the following components:   WBC 11.1 (*)    Hemoglobin 17.3 (*)    All other components within normal limits  URINALYSIS, ROUTINE W REFLEX MICROSCOPIC - Abnormal; Notable for the following components:   Color, Urine AMBER (*)  APPearance TURBID (*)    Protein, ur 100 (*)    Leukocytes,Ua LARGE (*)    Bacteria, UA FEW (*)    Non Squamous Epithelial PRESENT (*)    All other components within normal limits  URINE CULTURE  RESP PANEL BY RT-PCR (RSV, FLU A&B, COVID)  RVPGX2  RESP PANEL BY RT-PCR (RSV, FLU A&B, COVID)  RVPGX2  HEPATIC FUNCTION PANEL     EKG  ED ECG REPORT I, Chesley Noon, the attending physician, personally viewed and interpreted this ECG.   Date: 11/27/2023  EKG Time: 15:29  Rate: 102  Rhythm: sinus tachycardia  Axis: LAD  Intervals:none  ST&T Change: None  RADIOLOGY CT head reviewed and interpreted by me with no hemorrhage or midline shift.  PROCEDURES:  Critical Care performed: No  Procedures   MEDICATIONS ORDERED IN ED: Medications   cefTRIAXone (ROCEPHIN) 1 g in sodium chloride 0.9 % 100 mL IVPB (1 g Intravenous New Bag/Given 11/27/23 2245)  abacavir-dolutegravir-lamiVUDine (TRIUMEQ) 600-50-300 MG per tablet 1 tablet (has no administration in time range)  sodium chloride 0.9 % bolus 1,000 mL (1,000 mLs Intravenous New Bag/Given 11/27/23 2245)  morphine (PF) 4 MG/ML injection 4 mg (4 mg Intravenous Given 11/27/23 2245)     IMPRESSION / MDM / ASSESSMENT AND PLAN / ED COURSE  I reviewed the triage vital signs and the nursing notes.                              83 y.o. male with past medical history of hypertension, stroke, COPD, and HIV who presents to the ED with worsening generalized weakness and confusion for the past 7 days.  Patient's presentation is most consistent with acute presentation with potential threat to life or bodily function.  Differential diagnosis includes, but is not limited to, intracranial injury, cervical spine injury, rib fracture, hemothorax, pneumothorax, anemia, electrolyte abnormality, AKI, UTI, stroke.  Patient nontoxic-appearing and in no acute distress, vital signs are unremarkable.  He is alert but somewhat disoriented, no focal neurologic deficits noted on exam.  CT head and cervical spine performed from triage and negative for acute process, patient does have likely left sixth rib fracture on chest x-ray, but no hemothorax or pneumothorax noted.  X-rays of left hip and left knee are negative for acute process.  Labs show mild AKI without acute electrolyte abnormality, no significant anemia or leukocytosis noted.  Urinalysis does appear concerning for infection and we will send for culture, treat with IV Rocephin.  Patient also tested positive for influenza.  Case discussed with hospitalist for admission.      FINAL CLINICAL IMPRESSION(S) / ED DIAGNOSES   Final diagnoses:  Generalized weakness  Acute cystitis without hematuria  Influenza A     Rx / DC Orders   ED Discharge Orders      None        Note:  This document was prepared using Dragon voice recognition software and may include unintentional dictation errors.   Chesley Noon, MD 11/27/23 [phone]

## 2023-11-28 DIAGNOSIS — G9341 Metabolic encephalopathy: Secondary | ICD-10-CM | POA: Diagnosis not present

## 2023-11-28 DIAGNOSIS — N390 Urinary tract infection, site not specified: Secondary | ICD-10-CM | POA: Insufficient documentation

## 2023-11-28 DIAGNOSIS — R531 Weakness: Principal | ICD-10-CM

## 2023-11-28 LAB — BASIC METABOLIC PANEL
Anion gap: 11 (ref 5–15)
BUN: 24 mg/dL — ABNORMAL HIGH (ref 8–23)
CO2: 22 mmol/L (ref 22–32)
Calcium: 8.8 mg/dL — ABNORMAL LOW (ref 8.9–10.3)
Chloride: 109 mmol/L (ref 98–111)
Creatinine, Ser: 1.01 mg/dL (ref 0.61–1.24)
GFR, Estimated: >60 mL/min (ref >60)
Glucose, Bld: 111 mg/dL — ABNORMAL HIGH (ref 70–99)
Potassium: 3.9 mmol/L (ref 3.5–5.1)
Sodium: 142 mmol/L (ref 135–145)

## 2023-11-28 LAB — HEPATIC FUNCTION PANEL
ALT: 27 U/L (ref 0–44)
AST: 43 U/L — ABNORMAL HIGH (ref 15–41)
Albumin: 4.2 g/dL (ref 3.5–5.0)
Alkaline Phosphatase: 51 U/L (ref 38–126)
Bilirubin, Direct: 0.1 mg/dL (ref 0.0–0.2)
Indirect Bilirubin: 1 mg/dL — ABNORMAL HIGH (ref 0.3–0.9)
Total Bilirubin: 1.1 mg/dL (ref 0.0–1.2)
Total Protein: 8.4 g/dL — ABNORMAL HIGH (ref 6.5–8.1)

## 2023-11-28 LAB — CBC
HCT: 43.4 % (ref 39.0–52.0)
Hemoglobin: 15 g/dL (ref 13.0–17.0)
MCH: 31.5 pg (ref 26.0–34.0)
MCHC: 34.6 g/dL (ref 30.0–36.0)
MCV: 91.2 fL (ref 80.0–100.0)
Platelets: 206 10*3/uL (ref 150–400)
RBC: 4.76 MIL/uL (ref 4.22–5.81)
RDW: 13.3 % (ref 11.5–15.5)
WBC: 8.6 10*3/uL (ref 4.0–10.5)
nRBC: 0 % (ref 0.0–0.2)

## 2023-11-28 MED ORDER — DULOXETINE HCL 30 MG PO CPEP
30.0000 mg | ORAL_CAPSULE | Freq: Every day | ORAL | Status: DC
  Administered 2023-11-28 – 2023-12-02 (×5): 30 mg via ORAL
  Filled 2023-11-28 (×5): qty 1

## 2023-11-28 MED ORDER — ACETAMINOPHEN 650 MG RE SUPP
650.0000 mg | Freq: Four times a day (QID) | RECTAL | Status: DC | PRN
Start: 2023-11-28 — End: 2023-12-02

## 2023-11-28 MED ORDER — VITAMIN D (ERGOCALCIFEROL) 1.25 MG (50000 UNIT) PO CAPS
50000.0000 [IU] | ORAL_CAPSULE | ORAL | Status: DC
Start: 2023-12-01 — End: 2023-12-02
  Administered 2023-12-01: 50000 [IU] via ORAL
  Filled 2023-11-28: qty 1

## 2023-11-28 MED ORDER — ALBUTEROL SULFATE (2.5 MG/3ML) 0.083% IN NEBU: 2.5000 mg | INHALATION_SOLUTION | Freq: Four times a day (QID) | RESPIRATORY_TRACT | Status: DC | PRN

## 2023-11-28 MED ORDER — CLOPIDOGREL BISULFATE 75 MG PO TABS
75.0000 mg | ORAL_TABLET | Freq: Every day | ORAL | Status: DC
  Administered 2023-11-28 – 2023-12-02 (×5): 75 mg via ORAL
  Filled 2023-11-28 (×5): qty 1

## 2023-11-28 MED ORDER — PANTOPRAZOLE SODIUM 40 MG PO TBEC
40.0000 mg | DELAYED_RELEASE_TABLET | Freq: Every day | ORAL | Status: DC
  Administered 2023-11-28 – 2023-12-02 (×5): 40 mg via ORAL
  Filled 2023-11-28 (×5): qty 1

## 2023-11-28 MED ORDER — ONDANSETRON HCL 4 MG/2ML IJ SOLN: 4.0000 mg | Freq: Four times a day (QID) | INTRAMUSCULAR | Status: DC | PRN

## 2023-11-28 MED ORDER — SODIUM CHLORIDE 0.9 % IV SOLN
1.0000 g | INTRAVENOUS | Status: DC
  Administered 2023-11-29 – 2023-11-30 (×3): 1 g via INTRAVENOUS
  Filled 2023-11-28 (×4): qty 10

## 2023-11-28 MED ORDER — TRAMADOL-ACETAMINOPHEN 37.5-325 MG PO TABS
1.0000 | ORAL_TABLET | Freq: Every day | ORAL | Status: DC | PRN
  Filled 2023-11-28: qty 1

## 2023-11-28 MED ORDER — ALENDRONATE SODIUM 70 MG PO TABS: 70.0000 mg | ORAL_TABLET | ORAL | Status: DC

## 2023-11-28 MED ORDER — SIMETHICONE 80 MG PO CHEW: 80.0000 mg | CHEWABLE_TABLET | Freq: Four times a day (QID) | ORAL | Status: DC | PRN

## 2023-11-28 MED ORDER — KETOTIFEN FUMARATE 0.035 % OP SOLN
1.0000 [drp] | Freq: Two times a day (BID) | OPHTHALMIC | Status: DC
  Administered 2023-11-28 – 2023-12-02 (×8): 1 [drp] via OPHTHALMIC
  Filled 2023-11-28: qty 5

## 2023-11-28 MED ORDER — ENOXAPARIN SODIUM 40 MG/0.4ML IJ SOSY
40.0000 mg | PREFILLED_SYRINGE | INTRAMUSCULAR | Status: DC
  Administered 2023-11-29 – 2023-12-02 (×4): 40 mg via SUBCUTANEOUS
  Filled 2023-11-28 (×4): qty 0.4

## 2023-11-28 MED ORDER — METOPROLOL SUCCINATE ER 50 MG PO TB24
50.0000 mg | ORAL_TABLET | Freq: Every day | ORAL | Status: DC
  Administered 2023-11-29 – 2023-11-30 (×2): 50 mg via ORAL
  Filled 2023-11-28 (×2): qty 1

## 2023-11-28 MED ORDER — METOPROLOL SUCCINATE ER 25 MG PO TB24
25.0000 mg | ORAL_TABLET | Freq: Every day | ORAL | Status: DC
  Administered 2023-11-28: 25 mg via ORAL
  Filled 2023-11-28: qty 1

## 2023-11-28 MED ORDER — EZETIMIBE 10 MG PO TABS
10.0000 mg | ORAL_TABLET | Freq: Every day | ORAL | Status: DC
  Administered 2023-11-28 – 2023-12-02 (×5): 10 mg via ORAL
  Filled 2023-11-28 (×5): qty 1

## 2023-11-28 MED ORDER — DUTASTERIDE 0.5 MG PO CAPS
0.5000 mg | ORAL_CAPSULE | Freq: Every day | ORAL | Status: DC
  Administered 2023-11-29 – 2023-12-02 (×4): 0.5 mg via ORAL
  Filled 2023-11-28 (×4): qty 1

## 2023-11-28 MED ORDER — ONDANSETRON HCL 4 MG PO TABS: 4.0000 mg | ORAL_TABLET | Freq: Four times a day (QID) | ORAL | Status: DC | PRN

## 2023-11-28 MED ORDER — ACETAMINOPHEN 325 MG PO TABS: 650.0000 mg | ORAL_TABLET | Freq: Four times a day (QID) | ORAL | Status: DC | PRN

## 2023-11-28 MED ORDER — TAMSULOSIN HCL 0.4 MG PO CAPS
0.4000 mg | ORAL_CAPSULE | Freq: Every day | ORAL | Status: DC
  Administered 2023-11-28 – 2023-12-02 (×5): 0.4 mg via ORAL
  Filled 2023-11-28 (×5): qty 1

## 2023-11-28 MED ORDER — HYDRALAZINE HCL 20 MG/ML IJ SOLN
10.0000 mg | Freq: Four times a day (QID) | INTRAMUSCULAR | Status: DC | PRN
  Administered 2023-11-28: 10 mg via INTRAVENOUS
  Filled 2023-11-28: qty 1

## 2023-11-28 MED ORDER — ABACAVIR-DOLUTEGRAVIR-LAMIVUD 600-50-300 MG PO TABS: 1.0000 | ORAL_TABLET | Freq: Every day | ORAL | Status: DC

## 2023-11-28 NOTE — Assessment & Plan Note (Signed)
Continue metoprolol

## 2023-11-28 NOTE — ED Notes (Signed)
Pt cont to urinate in his brief--he was educated about calling for assistance with urinal. Pt has been cleaned and new brief placed on. OT did work with pt earlier

## 2023-11-28 NOTE — ED Notes (Signed)
Pt's BP elevated, regardless of his regularly scheduled meds given prior, per order.  Dr. Clide Dales informed as pt did not have any PRNs.

## 2023-11-28 NOTE — Assessment & Plan Note (Signed)
Rocephin Follow cultures

## 2023-11-28 NOTE — ED Notes (Signed)
This RN changed patients brief at this time and patient was cleaned up and provided with warm blankets.

## 2023-11-28 NOTE — Evaluation (Signed)
Physical Therapy Evaluation Patient Details Name: Brian [name] MRN: [id number] DOB: [birth year] Today's Date: 11/28/2023  History of Present Illness  82yoM here after days of weakness at home, fall, Left knee pain, unable to walk. Pt familiar to our services from prior admission- PMH: PPM, HTN, HLD, COPD on prn O2 at night, depression, GAD, HIV, BPH, OSA not on CPAP, CVA.  Clinical Impression  Pt asleep but easily made awake, participatory, appropriate interactions/following basic commands.Pt [name]/o 9/10 pain in Left knee, particularly in the thigh just proximal the kneecap. Pt is able to perform bed mobility and come to standing briefly, but is limited by bilat leg weakness and left leg pain. Pt unable to tolerate time on feet long enough to attempt any AMB. Will continue to follow- anticipate need to partake in STR after DC to prepare for safe return to home.       If plan is discharge home, recommend the following: A lot of help with bathing/dressing/bathroom;A lot of help with walking and/or transfers;Assist for transportation;Assistance with cooking/housework;Help with stairs or ramp for entrance   Can travel by private vehicle   No    Equipment Recommendations None recommended by PT  Recommendations for Other Services       Functional Status Assessment Patient has had a recent decline in their functional status and demonstrates the ability to make significant improvements in function in a reasonable and predictable amount of time.     Precautions / Restrictions Precautions Precautions: Fall Restrictions Weight Bearing Restrictions Per Provider Order: No      Mobility  Bed Mobility Overal bed mobility: Needs Assistance Bed Mobility: Supine to Sit, Sit to Supine     Supine to sit: Supervision Sit to supine: Supervision   General bed mobility comments: high ED gurney    Transfers Overall transfer level: Needs assistance Equipment used: 1 person hand held assist Transfers:  Sit to/from Stand Sit to Stand: Contact guard assist, From elevated surface           General transfer comment: partiall bears weight thorughout LLE; toelrates <20 seconds due to BLE weakness    Ambulation/Gait Ambulation/Gait assistance:  (pt unable)                Stairs            Wheelchair Mobility     Tilt Bed    Modified Rankin (Stroke Patients Only)       Balance Overall balance assessment: History of Falls                                           Pertinent Vitals/Pain Pain Assessment Pain Assessment: 0-10 Pain Score: 9  Pain Location: Left knee/ central quads above the knee Pain Intervention(s): Limited activity within patient's tolerance, Monitored during session, Premedicated before session, Repositioned    Home Living Family/patient expects to be discharged to:: Private residence Living Arrangements: Alone Available Help at Discharge: Family Type of Home: Apartment [facility]) Home Access: Level entry       Home Layout: One level Home Equipment: Shower seat - built in;Grab bars - tub/shower;Grab bars - toilet;Rolling Environmental consultant (2 wheels) Additional Comments: oxygen    Prior Function Prior Level of Function : Independent/Modified Independent             Mobility Comments: Uses RW for HH mobility, per DIL uses transport chair for community  distances ADLs Comments: Family assists with some bathing 2/2 baseline weakness from prior stroke.     Extremity/Trunk Assessment                Communication      Cognition Arousal:  (a little drowsy, a little confused, genrally WNL for session)                                              General Comments      Exercises     Assessment/Plan    PT Assessment Patient needs continued PT services  PT Problem List Decreased strength;Decreased range of motion;Decreased activity tolerance;Decreased balance;Decreased mobility;Decreased  coordination;Decreased cognition;Decreased knowledge of use of DME       PT Treatment Interventions DME instruction;Gait training;Stair training;Functional mobility training;Therapeutic activities;Therapeutic exercise;Balance training;Neuromuscular re-education;Patient/family education    PT Goals (Current goals can be found in the Care Plan section)  Acute Rehab PT Goals PT Goal Formulation: Patient unable to participate in goal setting    Frequency Min 1X/week     Co-evaluation               AM-PAC PT "6 Clicks" Mobility  Outcome Measure Help needed turning from your back to your side while in a flat bed without using bedrails?: A Little Help needed moving from lying on your back to sitting on the side of a flat bed without using bedrails?: A Little Help needed moving to and from a bed to a chair (including a wheelchair)?: A Lot Help needed standing up from a chair using your arms (e.g., wheelchair or bedside chair)?: A Lot Help needed to walk in hospital room?: A Lot Help needed climbing 3-5 steps with a railing? : Total 6 Click Score: 13    End of Session   Activity Tolerance: Patient limited by fatigue;Patient limited by pain;Patient tolerated treatment well Patient left: in bed;with call bell/phone within reach;with bed alarm set Nurse Communication: Mobility status PT Visit Diagnosis: Unsteadiness on feet (R26.81);Other abnormalities of gait and mobility (R26.89);Difficulty in walking, not elsewhere classified (R26.2);Muscle weakness (generalized) (M62.81)    Time: 1610-9604 PT Time Calculation (min) (ACUTE ONLY): 12 min   Charges:   PT Evaluation $PT Eval Moderate Complexity: 1 Mod   PT General Charges $$ ACUTE PT VISIT: 1 Visit       1:52 PM, 11/28/23 Rosamaria Lints, PT, DPT Physical Therapist - [facility]  [phone] (ASCOM)    Brian [name] 11/28/2023, 1:50 PM

## 2023-11-28 NOTE — Assessment & Plan Note (Signed)
Encourage oral hydration Monitor renal function and avoid nephrotoxins

## 2023-11-28 NOTE — Consult Note (Signed)
 PHARMACIST - PHYSICIAN COMMUNICATION  CONCERNING: P&T Medication Policy Regarding Oral Bisphosphonates  RECOMMENDATION: Your order for alendronate (Fosamax), ibandronate (Boniva), or risedronate (Actonel) has been discontinued at this time.  If the patient's post-hospital medical condition warrants safe use of this class of drugs, please resume the pre-hospital regimen upon discharge.  DESCRIPTION:  Alendronate (Fosamax), ibandronate (Boniva), and risedronate (Actonel) can cause severe esophageal erosions in patients who are unable to remain upright at least 30 minutes after taking this medication.   Since brief interruptions in therapy are thought to have minimal impact on bone mineral density, the Pharmacy & Therapeutics Committee has established that bisphosphonate orders should be routinely discontinued during hospitalization.   To override this safety policy and permit administration of Boniva, Fosamax, or Actonel in the hospital, prescribers must write "DO NOT HOLD" in the comments section when placing the order for this class of medications.

## 2023-11-28 NOTE — ED Notes (Signed)
Pt's wet brief changed. No other needs at this time.

## 2023-11-28 NOTE — Progress Notes (Signed)
Courtesy visit No billing-  Patient is seen and examined today morning.  Patient is a 83 year old gentleman admitted to hospitalist service for evaluation of generalized weakness, fall, lethargy, confusion, poor oral intake.  He is found to have UTI.  Patient denies any complaints.  He is more alert and awake today.  Blood pressure seems to be elevated.  I started him on IV hydralazine as needed doses every 6 hourly, increased his metoprolol XL to 50 mg from tomorrow.  His kidney function improved.  Patient is eating better.  Will continue IV Rocephin therapy, follow urine cultures, blood cultures ordered. We will get PT OT evaluation for final disposition plan.

## 2023-11-28 NOTE — H&P (Addendum)
History and Physical    Patient: Brian [name] ZOX:[id number] DOB: 1941/06/15 DOA: 11/27/2023 DOS: the patient was seen and examined on 11/28/2023 PCP: Josefina Do., MD  Patient coming from: Home  Chief Complaint:  Chief Complaint  Patient presents with   Weakness    HPI: Brian [name] is a 83 y.o. male with medical history significant for HTN, HIV, prior CVA with right upper extremity weakness, COPD who presents to the ED with a 5-day history of weakness resulting in a fall, lethargy, sleeping a lot, decreased oral intake and confusion.  History is provided by son and daughter-in-law at bedside who stated that for the past 3 days he has been coughing and then on the day of arrival he developed diarrhea and shortness of breath.  They also noted him to be warm to the touch. ED course and data review: Afebrile mildly tachycardic on arrival to 101 with otherwise normal vitals. Workup notable for the following: WBC 11,000, Creatinine 1.29, up from 1.07 about 4 months prior, hemoglobin 17.  Urinalysis with large leukocyte esterase and few bacteria.  Respiratory viral panel negative for COVID flu and RSV.  Trauma imaging which included CT head, C-spine, ribs, left hip and knee significant For nondisplaced sixth rib fracture Patient given a fluid bolus of 1 L and started on Rocephin Hospitalist consulted for admission.    Past Medical History:  Diagnosis Date   Arthritis    Asthma    Bradycardia    Depression    Enlarged prostate    HIV (human immunodeficiency virus infection) (HCC)    Hyperlipidemia    Stroke [facility])    Past Surgical History:  Procedure Laterality Date   cataracts     EYE SURGERY     PACEMAKER INSERTION N/A 08/02/2015   Procedure: INSERTION PACEMAKER;  Surgeon: Corky Downs, MD;  Location: [name];  Service: Cardiovascular;  Laterality: N/A;   Social History:  reports that he has quit smoking. His smoking use included cigarettes. He has never used smokeless  tobacco. He reports current alcohol use of about 4.0 standard drinks of alcohol per week. He reports that he does not use drugs.  No Known Allergies  Family History  Problem Relation Age of Onset   Heart disease Brother    Hypertension Other     Prior to Admission medications   Medication Sig Start Date End Date Taking? Authorizing Provider  abacavir-dolutegravir-lamiVUDine (TRIUMEQ) 600-50-300 MG tablet Take 1 tablet by mouth daily. 07/22/23   Lynn Ito, MD  alendronate (FOSAMAX) 70 MG tablet Take 70 mg by mouth once a week. Take on Saturday. 07/21/15   [provider]  azelastine (OPTIVAR) 0.05 % ophthalmic solution Apply 1 drop to eye 2 (two) times daily. 03/28/21   [provider]  clonazePAM (KLONOPIN) 0.5 MG tablet Take 1 tablet (0.5 mg total) by mouth 2 (two) times daily as needed for up to 5 days for anxiety. 05/01/22 07/17/23  Dorcas Carrow, MD  clopidogrel (PLAVIX) 75 MG tablet Take 75 mg by mouth daily. 05/12/23   [provider]  DULoxetine (CYMBALTA) 30 MG capsule Take 30 mg by mouth daily. 08/14/20   [provider]  dutasteride (AVODART) 0.5 MG capsule Take 1 capsule (0.5 mg total) by mouth daily. 10/25/20   Sondra Come, MD  ezetimibe (ZETIA) 10 MG tablet Take 10 mg by mouth daily. 06/25/22   [provider]  loratadine (CLARITIN) 10 MG tablet Take 10 mg by mouth daily.  [provider]  metoprolol succinate (TOPROL-XL) 25 MG 24 hr tablet TAKE 1 TABLET (25 MG TOTAL) BY MOUTH IN THE MORNING AND AT BEDTIME. 03/11/22   Corky Downs, MD  pantoprazole (PROTONIX) 40 MG tablet Take 1 tablet (40 mg total) by mouth daily. 05/03/22   Dorcas Carrow, MD  simethicone (MYLICON) 80 MG chewable tablet Chew 1 tablet (80 mg total) by mouth every 6 (six) hours as needed for flatulence. 05/02/22   Dorcas Carrow, MD  tamsulosin (FLOMAX) 0.4 MG CAPS capsule Take 1 capsule (0.4 mg total) by mouth daily. 10/25/20   Sondra Come, MD   traMADol-acetaminophen (ULTRACET) 37.5-325 MG tablet Take 1 tablet by mouth daily as needed. 06/29/23   [provider]  VENTOLIN HFA 108 (90 BASE) MCG/ACT inhaler Inhale 2 puffs into the lungs 4 (four) times daily as needed. For shortness of breath and/or wheezing. 07/21/15   [provider]  Vitamin D, Ergocalciferol, (DRISDOL) 1.25 MG (50000 UNIT) CAPS capsule Take 50,000 Units by mouth once a week. 07/03/23   [provider]    Physical Exam: Vitals:   11/27/23 1529 11/27/23 2156 11/28/23 0000 11/28/23 0100  BP:  (!) 112/96 (!) 147/97 113/87  Pulse:  (!) 101 74 69  Resp:  18  18  Temp:  97.7 F (36.5 C)    TempSrc:      SpO2:  99% 98% 92%  Weight: 77.1 kg     Height: 5\' 6"  (1.676 m)      Physical Exam Vitals and nursing note reviewed.  Constitutional:      General: He is not in acute distress. HENT:     Head: Normocephalic and atraumatic.  Cardiovascular:     Rate and Rhythm: Regular rhythm. Tachycardia present.     Heart sounds: Normal heart sounds.  Pulmonary:     Effort: Pulmonary effort is normal.     Breath sounds: Normal breath sounds.  Abdominal:     Palpations: Abdomen is soft.     Tenderness: There is no abdominal tenderness.  Neurological:     Mental Status: Mental status is at baseline. He is lethargic.     Labs on Admission: I have personally reviewed following labs and imaging studies  CBC: Recent Labs  Lab 11/27/23 1530  WBC 11.1*  HGB 17.3*  HCT 48.4  MCV 88.0  PLT 282   Basic Metabolic Panel: Recent Labs  Lab 11/27/23 1530  NA 140  K 3.7  CL 105  CO2 22  GLUCOSE 148*  BUN 20  CREATININE 1.29*  CALCIUM 9.6   GFR: Estimated Creatinine Clearance: 43.2 mL/min (A) (by C-G formula based on SCr of 1.29 mg/dL (H)). Liver Function Tests: Recent Labs  Lab 11/27/23 1530  AST 43*  ALT 27  ALKPHOS 51  BILITOT 1.1  PROT 8.4*  ALBUMIN 4.2   No results for input(s): "LIPASE", "AMYLASE" in the last 168 hours. No  results for input(s): "AMMONIA" in the last 168 hours. Coagulation Profile: No results for input(s): "INR", "PROTIME" in the last 168 hours. Cardiac Enzymes: No results for input(s): "CKTOTAL", "CKMB", "CKMBINDEX", "TROPONINI" in the last 168 hours. BNP (last 3 results) No results for input(s): "PROBNP" in the last 8760 hours. HbA1C: No results for input(s): "HGBA1C" in the last 72 hours. CBG: No results for input(s): "GLUCAP" in the last 168 hours. Lipid Profile: No results for input(s): "CHOL", "HDL", "LDLCALC", "TRIG", "CHOLHDL", "LDLDIRECT" in the last 72 hours. Thyroid Function Tests: No results for input(s): "TSH", "  T4TOTAL", "FREET4", "T3FREE", "THYROIDAB" in the last 72 hours. Anemia Panel: No results for input(s): "VITAMINB12", "FOLATE", "FERRITIN", "TIBC", "IRON", "RETICCTPCT" in the last 72 hours. Urine analysis:    Component Value Date/Time   COLORURINE AMBER (A) 11/27/2023 1531   APPEARANCEUR TURBID (A) 11/27/2023 1531   APPEARANCEUR Cloudy (A) 10/25/2020 1303   LABSPEC 1.013 11/27/2023 1531   LABSPEC 1.015 11/09/2012 1958   PHURINE 5.0 11/27/2023 1531   GLUCOSEU NEGATIVE 11/27/2023 1531   GLUCOSEU Negative 11/09/2012 1958   HGBUR NEGATIVE 11/27/2023 1531   BILIRUBINUR NEGATIVE 11/27/2023 1531   BILIRUBINUR Negative 10/25/2020 1303   BILIRUBINUR Negative 11/09/2012 1958   KETONESUR NEGATIVE 11/27/2023 1531   PROTEINUR 100 (A) 11/27/2023 1531   NITRITE NEGATIVE 11/27/2023 1531   LEUKOCYTESUR LARGE (A) 11/27/2023 1531   LEUKOCYTESUR 3+ 11/09/2012 1958    Radiological Exams on Admission: DG Knee Complete 4 Views Left Result Date: 11/27/2023 CLINICAL DATA:  Pain after fall EXAM: LEFT KNEE - COMPLETE 4 VIEW COMPARISON:  None Available. FINDINGS: No evidence of fracture, dislocation, or joint effusion. No evidence of arthropathy or other focal bone abnormality. Soft tissues are unremarkable. Artifact from the patient's brace. Vascular calcifications. IMPRESSION:  Grossly no acute osseous abnormality. Artifact from the overlapping brace. Electronically Signed   By: Karen Kays M.D.   On: 11/27/2023 17:03   DG Hip Unilat W or Wo Pelvis 2-3 Views Left Result Date: 11/27/2023 CLINICAL DATA:  Pain after fall EXAM: DG HIP (WITH OR WITHOUT PELVIS) 3V LEFT COMPARISON:  None Available. FINDINGS: No fracture or dislocation. Preserved joint spaces. Only mild degenerative changes of the inferior aspect of the sacroiliac joints. Osteopenia. Presumed vascular calcifications in the pelvis. Mild degenerative changes of the visualized portions of the lumbar spine. With this level of osteopenia subtle nondisplaced injury is difficult to completely exclude and if needed additional cross-sectional imaging as clinically appropriate. Metallic focus overlying the perineum. Please correlate with history. IMPRESSION: Osteopenia. Mild degenerative changes. No acute osseous abnormality. Electronically Signed   By: Karen Kays M.D.   On: 11/27/2023 17:01   DG Ribs Unilateral W/Chest Left Result Date: 11/27/2023 CLINICAL DATA:  Pain after fall EXAM: LEFT RIBS AND CHEST - 4 VIEW COMPARISON:  Chest x-ray 07/16/2023 FINDINGS: Left upper chest battery pack. Enlarged cardiopericardial silhouette. Calcified aorta. Slight prominence of the central vasculature. No pneumothorax or effusion. Subtle left lung base opacity. There is a question of a nondisplaced fracture of the lateral aspect of the left sixth rib. IMPRESSION: Question nondisplaced left 6 rib fracture. No pneumothorax or effusion. Enlarged heart with pacemaker. Subtle left lung base opacity. Recommend follow-up Electronically Signed   By: Karen Kays M.D.   On: 11/27/2023 16:59   CT Cervical Spine Wo Contrast Result Date: 11/27/2023 CLINICAL DATA:  Neck trauma (Age >= 65y) EXAM: CT CERVICAL SPINE WITHOUT CONTRAST TECHNIQUE: Multidetector CT imaging of the cervical spine was performed without intravenous contrast. Multiplanar CT image  reconstructions were also generated. RADIATION DOSE REDUCTION: This exam was performed according to the departmental dose-optimization program which includes automated exposure control, adjustment of the mA and/or kV according to patient size and/or use of iterative reconstruction technique. COMPARISON:  07/15/2021 FINDINGS: Alignment: Facet joints are aligned without dislocation or traumatic listhesis. Dens and lateral masses are aligned. Straightening of the cervical lordosis. Skull base and vertebrae: No acute fracture. No primary bone lesion or focal pathologic process. Soft tissues and spinal canal: No prevertebral fluid or swelling. No visible canal hematoma. Disc levels:  Advanced  multilevel cervical spondylosis. Upper chest: Included lung apices are clear. Other: Bilateral carotid atherosclerosis. IMPRESSION: No acute fracture or traumatic listhesis of the cervical spine. Electronically Signed   By: Duanne Guess D.O.   On: 11/27/2023 16:52   CT Head Wo Contrast Result Date: 11/27/2023 CLINICAL DATA:  Head trauma, minor (Age >= 65y) EXAM: CT HEAD WITHOUT CONTRAST TECHNIQUE: Contiguous axial images were obtained from the base of the skull through the vertex without intravenous contrast. RADIATION DOSE REDUCTION: This exam was performed according to the departmental dose-optimization program which includes automated exposure control, adjustment of the mA and/or kV according to patient size and/or use of iterative reconstruction technique. COMPARISON:  07/16/2023 FINDINGS: Brain: No evidence of acute infarction, hemorrhage, hydrocephalus, extra-axial collection or mass lesion/mass effect. Patchy low-density changes within the periventricular and subcortical white matter most compatible with chronic microvascular ischemic change. Moderate diffuse cerebral volume loss. Vascular: Atherosclerotic calcifications involving the large vessels of the skull base. No unexpected hyperdense vessel. Skull: Normal.  Negative for fracture or focal lesion. Sinuses/Orbits: No acute finding. Other: Negative for scalp hematoma. IMPRESSION: 1. No acute intracranial findings. 2. 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 Electronically Signed   By: Duanne Guess D.O.   On: 11/27/2023 16:47     Data Reviewed: Relevant notes from primary care and specialist visits, past discharge summaries as available in EHR, including Care Everywhere. Prior diagnostic testing as pertinent to current admission diagnoses Updated medications and problem lists for reconciliation ED course, including vitals, labs, imaging, treatment and response to treatment Triage notes, nursing and pharmacy notes and ED provider's notes Notable results as noted in HPI   Assessment and Plan: * Urinary tract infection Rocephin Follow cultures  Acute metabolic encephalopathy Secondary to acute infection Neurologic checks with fall and aspiration precautions  Generalized weakness Left sixth rib fracture secondary to fall at home Secondary to acute illness PT eval  AKI (acute kidney injury) (HCC) Encourage oral hydration Monitor renal function and avoid nephrotoxins  COPD (chronic obstructive pulmonary disease) (HCC) Not acutely exacerbated.  Albuterol as needed  Essential hypertension Continue metoprolol  HIV (human immunodeficiency virus infection) (HCC) Continue Triumeq    DVT prophylaxis: Lovenox  Consults: none  Advance Care Planning:   Code Status: Prior   Family Communication: Son and daughter-in-law at bedside  Disposition Plan: Back to previous home environment  Severity of Illness: The appropriate patient status for this patient is INPATIENT. Inpatient status is judged to be reasonable and necessary in order to provide the required intensity of service to ensure the patient's safety. The patient's presenting symptoms, physical exam findings, and initial radiographic and laboratory data in  the context of their chronic comorbidities is felt to place them at high risk for further clinical deterioration. Furthermore, it is not anticipated that the patient will be medically stable for discharge from the hospital within 2 midnights of admission.   * I certify that at the point of admission it is my clinical judgment that the patient will require inpatient hospital care spanning beyond 2 midnights from the point of admission due to high intensity of service, high risk for further deterioration and high frequency of surveillance required.*  Author: Andris Baumann, MD 11/28/2023 2:10 AM  For on call review [email or web address].

## 2023-11-28 NOTE — Assessment & Plan Note (Signed)
Not acutely exacerbated.  Albuterol as needed

## 2023-11-28 NOTE — Assessment & Plan Note (Signed)
Continue Triumeq

## 2023-11-28 NOTE — Assessment & Plan Note (Addendum)
Left sixth rib fracture secondary to fall at home Secondary to acute illness PT eval

## 2023-11-28 NOTE — Assessment & Plan Note (Signed)
Secondary to acute infection Neurologic checks with fall and aspiration precautions

## 2023-11-28 NOTE — ED Notes (Signed)
Brief changed. Call bell in reach. Bed low and locked. Lights dimmed for comfort.

## 2023-11-28 NOTE — ED Notes (Signed)
Pt has a family member in the room and she can verify his medications. Pharmacy was called and made aware of the above.

## 2023-11-28 NOTE — ED Notes (Signed)
Patients brief changed. Lights dimmed. Call bell in reach.

## 2023-11-28 NOTE — Evaluation (Signed)
Occupational Therapy Evaluation Patient Details Name: Brian [name] MRN: [id number] DOB: 1941/06/26 Today's Date: 11/28/2023   History of Present Illness 83 y.o. male who presents to the ED with a 5-day history of weakness resulting in a fall, lethargy, decreased oral intake and confusion. Trauma imaging which included CT of ribs showing nondisplaced sixth rib fracture. MD assessment: UTI. PMHx: HTN, HIV, prior CVA with RUE weakness, COPD, OSA, PPM, HLD, COPD on prn 02 at night, depression, BPH   Clinical Impression   Pt was seen for OT evaluation this date. Prior to hospital admission, pt was living alone in an apartment with assistance from family for bathing and IADLs. Pt MOD I with RW use indoors and able to perform ADLs MOD I. 2 recent falls reported.  Pt presents to acute OT demonstrating impaired ADL performance and functional mobility 2/2 weakness, pain, and low activity tolerance (See OT problem list for additional functional deficits). Pt currently requires Min/Mod A for bed mobility. MIN/CGA for STS from high gurney to RW x2 with ~45 second standing tolerance. Able to stand with BUE support on RW, CGA for balance and Max A for LB dressing to doff/don soiled brief and get new one. [name] use of urinal and ability to use that as able vs going in the brief. He attempted lateral steps, but unable to perform d/t LLE pain with weight bearing. Returned to supine.  Pt would benefit from skilled OT services to address noted impairments and functional limitations (see below for any additional details) in order to maximize safety and independence while minimizing falls risk and caregiver burden. Do anticipate the need for follow up OT services upon acute hospital DC.        If plan is discharge home, recommend the following: Two people to help with walking and/or transfers;A lot of help with bathing/dressing/bathroom;Assist for transportation;Assistance with cooking/housework;Help with stairs or ramp  for entrance    Functional Status Assessment  Patient has had a recent decline in their functional status and demonstrates the ability to make significant improvements in function in a reasonable and predictable amount of time.  Equipment Recommendations  Other (comment) (defer to next venue)    Recommendations for Other Services       Precautions / Restrictions Precautions Precautions: Fall Restrictions Weight Bearing Restrictions Per Provider Order: No      Mobility Bed Mobility Overal bed mobility: Needs Assistance Bed Mobility: Supine to Sit, Sit to Supine     Supine to sit: Min assist, Mod assist, HOB elevated Sit to supine: Min assist, Mod assist   General bed mobility comments: Min/Mod A from high gurney to reach seated EOB and to return d/t high gurney height and feet slipping; HOB slightly elevated    Transfers Overall transfer level: Needs assistance Equipment used: Rolling walker (2 wheels) Transfers: Sit to/from Stand Sit to Stand: Contact guard assist, From elevated surface           General transfer comment: CGA for STS from EOB x2 during session to RW with ~45 second standing tolerance; attempted lateral steps, but unable to perform d/t LLE pain      Balance Overall balance assessment: History of Falls, Needs assistance Sitting-balance support: Bilateral upper extremity supported, Feet supported Sitting balance-Leahy Scale: Fair     Standing balance support: Bilateral upper extremity supported, Reliant on assistive device for balance, During functional activity, Single extremity supported Standing balance-Leahy Scale: Poor Standing balance comment: Min/CGA and use of RW for support in standing  ADL either performed or assessed with clinical judgement   ADL Overall ADL's : Needs assistance/impaired                     Lower Body Dressing: Maximal assistance;Sit to/from stand Lower Body Dressing  Details (indicate cue type and reason): max A to doff soiled brief and don new one while standing at EOB     Toileting- Clothing Manipulation and Hygiene: Contact guard assist;Sit to/from stand;Minimal assistance Toileting - Clothing Manipulation Details (indicate cue type and reason): pt able to perform hygiene anterior in standing with CGA/Min A from therapist to maintain balance with unilateral support on RW             Vision         Perception         Praxis         Pertinent Vitals/Pain Pain Assessment Pain Assessment: Faces Faces Pain Scale: Hurts even more Pain Location: Left knee/ central quads above the knee Pain Intervention(s): Monitored during session, Limited activity within patient's tolerance, Repositioned     Extremity/Trunk Assessment Upper Extremity Assessment Upper Extremity Assessment: Generalized weakness   Lower Extremity Assessment Lower Extremity Assessment: Generalized weakness       Communication Communication Communication: No apparent difficulties Cueing Techniques: Verbal cues;Gestural cues;Tactile cues;Visual cues   Cognition Arousal: Alert   Overall Cognitive Status: Difficult to assess                                 General Comments: family in room interpreting during eval; did not ask orientation questions, but pt able to follow directions and state he had 2 recent falls at home and when he needed his brief changed     General Comments  sp02 stable although pt wheezing/DOE noted; left with HOB elevated; nurse aware and asking about inhaler use at home    Exercises Other Exercises Other Exercises: [name] role of OT in acute setting and importance of therapy upon DC to return to his functional baseline.   Shoulder Instructions      Home Living Family/patient expects to be discharged to:: Private residence Living Arrangements: Alone Available Help at Discharge: Family Type of Home: Apartment [facility]) Home Access: Level entry     Home Layout: One level     Bathroom Shower/Tub: Producer, television/film/video: Standard     Home Equipment: Shower seat - built in;Grab bars - tub/shower;Grab bars - toilet;Rolling Environmental consultant (2 wheels)   Additional Comments: oxygen      Prior Functioning/Environment Prior Level of Function : Independent/Modified Independent;History of Falls (last six months)             Mobility Comments: Uses RW for HH mobility, per DIL uses transport chair for community distances; pt repors x2 recent falls at home ADLs Comments: Family assists with some bathing 2/2 baseline weakness from prior stroke; they also provide groceries and drive to MD appts        OT Problem List: Decreased strength;Pain;Decreased activity tolerance;Impaired balance (sitting and/or standing)      OT Treatment/Interventions: Therapeutic exercise;Therapeutic activities;Self-care/ADL training;Patient/family education;Balance training;DME and/or AE instruction    OT Goals(Current goals can be found in the care plan section) Acute Rehab OT Goals OT Goal Formulation: Patient unable to participate in goal setting Time For Goal Achievement: 12/12/23 Potential to Achieve Goals: Good ADL Goals Pt Will Perform Lower Body  Bathing: with contact guard assist;sit to/from stand;sitting/lateral leans Pt Will Perform Lower Body Dressing: with contact guard assist;sitting/lateral leans;sit to/from stand Pt Will Transfer to Toilet: with contact guard assist;stand pivot transfer;bedside commode Pt Will Perform Toileting - Clothing Manipulation and hygiene: with contact guard assist;sit to/from stand;sitting/lateral leans  OT Frequency: Min 1X/week    Co-evaluation              AM-PAC OT "6 Clicks" Daily Activity     Outcome Measure Help from another person eating meals?: None Help from another person taking care of personal grooming?: A Little Help from another person toileting,  which includes using toliet, bedpan, or urinal?: A Lot Help from another person bathing (including washing, rinsing, drying)?: A Lot Help from another person to put on and taking off regular upper body clothing?: A Little Help from another person to put on and taking off regular lower body clothing?: A Lot 6 Click Score: 16   End of Session Equipment Utilized During Treatment: Rolling walker (2 wheels);Gait belt Nurse Communication: Mobility status  Activity Tolerance: Patient tolerated treatment well;Patient limited by pain Patient left: in bed;with call bell/phone within reach;with bed alarm set;with family/visitor present  OT Visit Diagnosis: Other abnormalities of gait and mobility (R26.89);Unsteadiness on feet (R26.81);Muscle weakness (generalized) (M62.81);History of falling (Z91.81);Pain Pain - Right/Left: Left Pain - part of body: Knee                Time: 7829-5621 OT Time Calculation (min): 19 min Charges:  OT General Charges $OT Visit: 1 Visit OT Evaluation $OT Eval Moderate Complexity: 1 Mod Brian [name], OTR/L 11/28/23, 3:33 PM  Brian [name] 11/28/2023, 3:30 PM

## 2023-11-29 ENCOUNTER — Encounter: Payer: Self-pay | Admitting: Internal Medicine

## 2023-11-29 DIAGNOSIS — N3000 Acute cystitis without hematuria: Secondary | ICD-10-CM

## 2023-11-29 DIAGNOSIS — N179 Acute kidney failure, unspecified: Secondary | ICD-10-CM | POA: Diagnosis not present

## 2023-11-29 DIAGNOSIS — I10 Essential (primary) hypertension: Secondary | ICD-10-CM

## 2023-11-29 DIAGNOSIS — G9341 Metabolic encephalopathy: Secondary | ICD-10-CM

## 2023-11-29 DIAGNOSIS — Z21 Asymptomatic human immunodeficiency virus [HIV] infection status: Secondary | ICD-10-CM

## 2023-11-29 DIAGNOSIS — S2232XA Fracture of one rib, left side, initial encounter for closed fracture: Secondary | ICD-10-CM

## 2023-11-29 DIAGNOSIS — J449 Chronic obstructive pulmonary disease, unspecified: Secondary | ICD-10-CM

## 2023-11-29 LAB — RESPIRATORY PANEL BY PCR

## 2023-11-29 MED ORDER — CLONIDINE HCL 0.1 MG PO TABS: 0.2000 mg | ORAL_TABLET | ORAL | Status: DC | PRN

## 2023-11-29 NOTE — Subjective & Objective (Signed)
Pt seen and examined. RN states pt confused when they used video interpretor. No family at bedside.

## 2023-11-29 NOTE — [place]2 (Signed)
[place] MEDICAID FL2 LEVEL OF CARE FORM     IDENTIFICATION  Patient Name: Brian [name] Birthdate: [birth year] Sex: male Admission Date (Current Location): 11/27/2023  [facility] and [place] Number:  Chiropodist and Address:  [facility], [address] [place], [place] [address]      Provider Number: [id number]  Attending Physician Name and Address:  Carollee Herter, DO  Relative Name and Phone Number:       Current Level of Care: Hospital Recommended Level of Care: Skilled Nursing Facility Prior Approval Number:    Date Approved/Denied:   PASRR Number: [id number]  Discharge Plan: SNF    Current Diagnoses: Patient Active Problem List   Diagnosis Date Noted   Closed fracture of one rib of left side 11/29/2023   Generalized weakness 11/28/2023   Urinary tract infection 11/28/2023   Acute metabolic encephalopathy 11/28/2023   COVID-19 virus infection 07/17/2023   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   Sepsis (HCC) 07/17/2023   Conjunctivitis 04/27/2022   Stroke (HCC) 04/25/2022   Hyperlipidemia    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    Depression with anxiety    Leg pain    Chest pain 07/13/2021   Essential hypertension 08/31/2020   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   Peripheral neuropathy 05/03/2020   COPD (chronic obstructive pulmonary disease) (HCC) 11/27/2018   Syncope 07/30/2015   AKI (acute kidney injury) (HCC) 07/30/2015   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   Sleep apnea 07/30/2015   HIV (human immunodeficiency virus infection) (HCC) 07/30/2015   Depression 07/30/2015   BPH (benign prostatic hyperplasia) 07/30/2015    Orientation RESPIRATION BLADDER Height & Weight     Self  Normal Continent Weight: 170 lb (77.1 kg) Height:  5\' 6"  (167.6 cm)  BEHAVIORAL SYMPTOMS/MOOD NEUROLOGICAL BOWEL NUTRITION STATUS      Continent Diet (heart healthy, thin liquids)  AMBULATORY STATUS COMMUNICATION OF NEEDS Skin   Extensive Assist  Verbally Normal                       Personal Care Assistance Level of Assistance  Dressing, Feeding, Bathing Bathing Assistance: Maximum assistance Feeding assistance: Limited assistance Dressing Assistance: Maximum assistance     Functional Limitations Info  Sight, Hearing, Speech Sight Info: Adequate Hearing Info: Adequate Speech Info: Adequate    SPECIAL CARE FACTORS FREQUENCY  PT (By licensed PT), OT (By licensed OT)     PT Frequency: 5x OT Frequency: 5x            Contractures Contractures Info: Not present    Additional Factors Info  Code Status, Allergies Code Status Info: DNR Allergies Info: NKA           Current Medications (11/29/2023):  This is the current hospital active medication list Current Facility-Administered Medications  Medication Dose Route Frequency Provider Last Rate Last Admin   abacavir-dolutegravir-lamiVUDine (TRIUMEQ) 600-50-300 MG per tablet 1 tablet  1 tablet Oral Daily Chesley Noon, MD   1 tablet at 11/29/23 0932   acetaminophen (TYLENOL) tablet 650 mg  650 mg Oral Q6H PRN Andris Baumann, MD       Or   acetaminophen (TYLENOL) suppository 650 mg  650 mg Rectal Q6H PRN Andris Baumann, MD       albuterol (PROVENTIL) (2.5 MG/3ML) 0.083% nebulizer solution 2.5 mg  2.5 mg Inhalation QID PRN Andris Baumann, MD       cefTRIAXone (ROCEPHIN) 1 g in sodium chloride 0.9 % 100 mL IVPB  1 g Intravenous Q24H Lindajo Royal V, MD 200 mL/hr at 11/29/23 0048 1 g at 11/29/23 0048   cloNIDine (CATAPRES) tablet 0.2 mg  0.2 mg Oral Q4H PRN Carollee Herter, DO       clopidogrel (PLAVIX) tablet 75 mg  75 mg Oral Daily Andris Baumann, MD   75 mg at 11/29/23 0932   DULoxetine (CYMBALTA) DR capsule 30 mg  30 mg Oral Daily Marcelino Duster, MD   30 mg at 11/29/23 0932   dutasteride (AVODART) capsule 0.5 mg  0.5 mg Oral Daily Marcelino Duster, MD   0.5 mg at 11/29/23 0932   enoxaparin (LOVENOX) injection 40 mg  40 mg Subcutaneous Q24H Lindajo Royal  V, MD   40 mg at 11/29/23 4782   ezetimibe (ZETIA) tablet 10 mg  10 mg Oral Daily Marcelino Duster, MD   10 mg at 11/29/23 0931   hydrALAZINE (APRESOLINE) injection 10 mg  10 mg Intravenous Q6H PRN Marcelino Duster, MD   10 mg at 11/28/23 1237   ketotifen (ZADITOR) 0.035 % ophthalmic solution 1 drop  1 drop Both Eyes BID Marcelino Duster, MD   1 drop at 11/29/23 0934   metoprolol succinate (TOPROL-XL) 24 hr tablet 50 mg  50 mg Oral Daily Marcelino Duster, MD   50 mg at 11/29/23 0939   ondansetron (ZOFRAN) tablet 4 mg  4 mg Oral Q6H PRN Andris Baumann, MD       Or   ondansetron [facility]) injection 4 mg  4 mg Intravenous Q6H PRN Andris Baumann, MD       pantoprazole (PROTONIX) EC tablet 40 mg  40 mg Oral Daily Marcelino Duster, MD   40 mg at 11/29/23 0932   simethicone (MYLICON) chewable tablet 80 mg  80 mg Oral Q6H PRN Marcelino Duster, MD       tamsulosin (FLOMAX) capsule 0.4 mg  0.4 mg Oral Daily Lindajo Royal V, MD   0.4 mg at 11/29/23 0932   traMADol-acetaminophen (ULTRACET) 37.5-325 MG per tablet 1 tablet  1 tablet Oral Daily PRN Andris Baumann, MD       [START ON 12/01/2023] Vitamin D (Ergocalciferol) (DRISDOL) 1.25 MG (50000 UNIT) capsule 50,000 Units  50,000 Units Oral Weekly Marcelino Duster, MD         Discharge Medications: Please see discharge summary for a list of discharge medications.  Relevant Imaging Results:  Relevant Lab Results:   Additional Information SSN:[id number]  Maree Krabbe, LCSW

## 2023-11-29 NOTE — Hospital Course (Signed)
HPI: Brian [name] is a 83 y.o. male with medical history significant for HTN, HIV, prior CVA with right upper extremity weakness, COPD who presents to the ED with a 5-day history of weakness resulting in a fall, lethargy, sleeping a lot, decreased oral intake and confusion.  History is provided by son and daughter-in-law at bedside who stated that for the past 3 days he has been coughing and then on the day of arrival he developed diarrhea and shortness of breath.  They also noted him to be warm to the touch.   Significant Events: Admitted 11/27/2023 for UTI, acute metabolic encephalopathy   Significant Labs: WBC 11.1, HgB 17.3, Plt 282 Na 140, K 3.7, BUN 20, scr 1.29, glu 148 UA amber/turbid, + LE, +bacteria, neg Nitrite  Significant Imaging Studies: CT head shows No acute intracranial findings. 2. Chronic microvascular ischemic change and cerebral volume loss CT c-spine shows No acute fracture or traumatic listhesis of the cervical spine  Left Rib/CXR shows Question nondisplaced left 6 rib fracture. No pneumothorax or effusion. Enlarged heart with pacemaker. Subtle left lung base opacity. Left hip XR shows Osteopenia. Mild degenerative changes. No acute osseous abnormality.  Left knee XR shows Grossly no acute osseous abnormality. Artifact from the overlapping brace.  Antibiotic Therapy: Anti-infectives (From admission, onward)    Start     Dose/Rate Route Frequency Ordered Stop   11/28/23 2300  cefTRIAXone (ROCEPHIN) 1 g in sodium chloride 0.9 % 100 mL IVPB        1 g 200 mL/hr over 30 Minutes Intravenous Every 24 hours 11/28/23 0218     11/28/23 1000  abacavir-dolutegravir-lamiVUDine (TRIUMEQ) 600-50-300 MG per tablet 1 tablet        1 tablet Oral Daily 11/27/23 2238     11/28/23 1000  abacavir-dolutegravir-lamiVUDine (TRIUMEQ) 600-50-300 MG per tablet 1 tablet  Status:  Discontinued        1 tablet Oral Daily 11/28/23 0217 11/28/23 0221   11/27/23 2230  cefTRIAXone (ROCEPHIN) 1 g in  sodium chloride 0.9 % 100 mL IVPB        1 g 200 mL/hr over 30 Minutes Intravenous  Once 11/27/23 2229 11/28/23 0018       Procedures:   Consultants:

## 2023-11-29 NOTE — Progress Notes (Signed)
PROGRESS NOTE    Brian [name]  QIH:[id number] DOB: 1941-07-05 DOA: 11/27/2023 PCP: Josefina Do., MD  Subjective: Pt seen and examined. RN states pt confused when they used video interpretor. No family at bedside.    Hospital Course: HPI: Brian [name] is a 83 y.o. male with medical history significant for HTN, HIV, prior CVA with right upper extremity weakness, COPD who presents to the ED with a 5-day history of weakness resulting in a fall, lethargy, sleeping a lot, decreased oral intake and confusion.  History is provided by son and daughter-in-law at bedside who stated that for the past 3 days he has been coughing and then on the day of arrival he developed diarrhea and shortness of breath.  They also noted him to be warm to the touch.   Significant Events: Admitted 11/27/2023 for UTI, acute metabolic encephalopathy   Significant Labs: WBC 11.1, HgB 17.3, Plt 282 Na 140, K 3.7, BUN 20, scr 1.29, glu 148 UA amber/turbid, + LE, +bacteria, neg Nitrite  Significant Imaging Studies: CT head shows No acute intracranial findings. 2. Chronic microvascular ischemic change and cerebral volume loss CT c-spine shows No acute fracture or traumatic listhesis of the cervical spine  Left Rib/CXR shows Question nondisplaced left 6 rib fracture. No pneumothorax or effusion. Enlarged heart with pacemaker. Subtle left lung base opacity. Left hip XR shows Osteopenia. Mild degenerative changes. No acute osseous abnormality.  Left knee XR shows Grossly no acute osseous abnormality. Artifact from the overlapping brace.  Antibiotic Therapy: Anti-infectives (From admission, onward)    Start     Dose/Rate Route Frequency Ordered Stop   11/28/23 2300  cefTRIAXone (ROCEPHIN) 1 g in sodium chloride 0.9 % 100 mL IVPB        1 g 200 mL/hr over 30 Minutes Intravenous Every 24 hours 11/28/23 0218     11/28/23 1000  abacavir-dolutegravir-lamiVUDine (TRIUMEQ) 600-50-300 MG per tablet 1 tablet        1  tablet Oral Daily 11/27/23 2238     11/28/23 1000  abacavir-dolutegravir-lamiVUDine (TRIUMEQ) 600-50-300 MG per tablet 1 tablet  Status:  Discontinued        1 tablet Oral Daily 11/28/23 0217 11/28/23 0221   11/27/23 2230  cefTRIAXone (ROCEPHIN) 1 g in sodium chloride 0.9 % 100 mL IVPB        1 g 200 mL/hr over 30 Minutes Intravenous  Once 11/27/23 2229 11/28/23 0018       Procedures:   Consultants:     Assessment and Plan: * Urinary tract infection 11-29-2023 urine cx growing GNR. Continue with IV Rocephin  Acute metabolic encephalopathy 11-29-2023 Secondary to acute infection. Neurologic checks with fall and aspiration precautions. Appears better.  Generalized weakness 11-29-2023 pt has been assessed by PT. Pt may need SNF. TOC looking into this.  Essential hypertension 11-29-2023 Continue Toprol-XL 50 mg qday. Prn clonidine. May need to add additional meds or increase dose of Toprol-XL  AKI (acute kidney injury) (HCC) 11-29-2023 resolved with IVF and treatment of UTI.  Encourage oral hydration. Monitor renal function and avoid nephrotoxins  Closed fracture of one rib of left side 11-29-2023 stable  COPD (chronic obstructive pulmonary disease) (HCC) 11-29-2023 Not acutely exacerbated.  Albuterol as needed  HIV (human immunodeficiency virus infection) (HCC) 11-29-2023 Continue Triumeq   DVT prophylaxis: enoxaparin (LOVENOX) injection 40 mg Start: 11/28/23 0800    Code Status: Limited: Do not attempt resuscitation (DNR) -DNR-LIMITED -Do Not Intubate/DNI  Family Communication: no family at bedside Disposition Plan:  SNF Reason for continuing need for hospitalization: remains on IV Rocephin.  Objective: Vitals:   11/28/23 2053 11/29/23 0344 11/29/23 0747 11/29/23 0938  BP:  (!) 160/96 (!) 174/102 (!) 179/100  Pulse: 77 79 81   Resp: 16 18 16    Temp: 97.9 F (36.6 C) (!) 97.3 F (36.3 C) 97.6 F (36.4 C)   TempSrc: Oral Oral Oral   SpO2: 100% 98% 96%    Weight:      Height:        Intake/Output Summary (Last 24 hours) at 11/29/2023 1313 Last data filed at 11/29/2023 1100 Gross per 24 hour  Intake --  Output 200 ml  Net -200 ml   Filed Weights   11/27/23 1529  Weight: 77.1 kg  Examination:  Physical Exam Vitals and nursing note reviewed.  Constitutional:      Appearance: He is not toxic-appearing or diaphoretic.  HENT:     Head: Normocephalic and atraumatic.     Nose: Nose normal.  Cardiovascular:     Rate and Rhythm: Normal rate and regular rhythm.  Pulmonary:     Effort: Pulmonary effort is normal.     Breath sounds: Normal breath sounds.  Abdominal:     General: Bowel sounds are normal.  Musculoskeletal:     Right lower leg: No edema.     Left lower leg: No edema.  Skin:    General: Skin is warm and dry.     Capillary Refill: Capillary refill takes less than 2 seconds.  Neurological:     Mental Status: He is disoriented.   Data Reviewed: I have personally reviewed following labs and imaging studies  CBC: Recent Labs  Lab 11/27/23 1530 11/28/23 0336  WBC 11.1* 8.6  HGB 17.3* 15.0  HCT 48.4 43.4  MCV 88.0 91.2  PLT 282 206   Basic Metabolic Panel: Recent Labs  Lab 11/27/23 1530 11/28/23 0336  NA 140 142  K 3.7 3.9  CL 105 109  CO2 22 22  GLUCOSE 148* 111*  BUN 20 24*  CREATININE 1.29* 1.01  CALCIUM 9.6 8.8*   GFR: Estimated Creatinine Clearance: 55.1 mL/min (by C-G formula based on SCr of 1.01 mg/dL). Liver Function Tests: Recent Labs  Lab 11/27/23 1530  AST 43*  ALT 27  ALKPHOS 51  BILITOT 1.1  PROT 8.4*  ALBUMIN 4.2   BNP (last 3 results) Recent Labs    07/16/23 2250  BNP 186.4*   Recent Results (from the past 240 hours)  Urine Culture     Status: Abnormal (Preliminary result)   Collection Time: 11/27/23  3:30 PM   Specimen: Urine, Random  Result Value Ref Range Status   Specimen Description   Final    URINE, RANDOM Performed at [facility], [address],  [place], [place] [address]    Special Requests   Final    Normal Performed at [facility], [address], [place], [place] [address]    Culture (A)  Final    >=100,000 COLONIES/mL GRAM NEGATIVE RODS IDENTIFICATION AND SUSCEPTIBILITIES TO FOLLOW Performed at [facility], [address] [place], [place] [address]    Report Status PENDING  Incomplete  Resp panel by RT-PCR (RSV, Flu A&B, Covid) Anterior Nasal Swab     Status: None   Collection Time: 11/27/23  9:54 PM   Specimen: Anterior Nasal Swab  Result Value Ref Range Status   SARS Coronavirus 2 by RT PCR NEGATIVE NEGATIVE Final  Comment: (NOTE) SARS-CoV-2 target nucleic acids are NOT DETECTED.  The SARS-CoV-2 RNA is generally detectable in upper respiratory specimens during the acute phase of infection. The lowest concentration of SARS-CoV-2 viral copies this assay can detect is 138 copies/mL. A negative result does not preclude SARS-Cov-2 infection and should not be used as the sole basis for treatment or other patient management decisions. A negative result may occur with  improper specimen collection/handling, submission of specimen other than nasopharyngeal swab, presence of viral mutation(s) within the areas targeted by this assay, and inadequate number of viral copies(<138 copies/mL). A negative result must be combined with clinical observations, patient history, and epidemiological information. The expected result is Negative.  Fact Sheet for Patients:  BloggerCourse.com  Fact Sheet for Healthcare Providers:  SeriousBroker.it  This test is no t yet approved or cleared by the [place] FDA and  has been authorized for detection and/or diagnosis of SARS-CoV-2 by FDA under an Emergency Use Authorization (EUA). This EUA will remain  in effect (meaning this test can be used) for the duration of the COVID-19 declaration under Section 564(b)(1) of the  Act, 21 U.S.C.section 360bbb-3(b)(1), unless the authorization is terminated  or revoked sooner.       Influenza A by PCR NEGATIVE NEGATIVE Final   Influenza B by PCR NEGATIVE NEGATIVE Final    Comment: (NOTE) The Xpert Xpress SARS-CoV-2/FLU/RSV plus assay is intended as an aid in the diagnosis of influenza from Nasopharyngeal swab specimens and should not be used as a sole basis for treatment. Nasal washings and aspirates are unacceptable for Xpert Xpress SARS-CoV-2/FLU/RSV testing.  Fact Sheet for Patients: BloggerCourse.com  Fact Sheet for Healthcare Providers: SeriousBroker.it  This test is not yet approved or cleared by the [place] FDA and has been authorized for detection and/or diagnosis of SARS-CoV-2 by FDA under an Emergency Use Authorization (EUA). This EUA will remain in effect (meaning this test can be used) for the duration of the COVID-19 declaration under Section 564(b)(1) of the Act, 21 U.S.C. section 360bbb-3(b)(1), unless the authorization is terminated or revoked.     Resp Syncytial Virus by PCR NEGATIVE NEGATIVE Final    Comment: (NOTE) Fact Sheet for Patients: BloggerCourse.com  Fact Sheet for Healthcare Providers: SeriousBroker.it  This test is not yet approved or cleared by the [place] FDA and has been authorized for detection and/or diagnosis of SARS-CoV-2 by FDA under an Emergency Use Authorization (EUA). This EUA will remain in effect (meaning this test can be used) for the duration of the COVID-19 declaration under Section 564(b)(1) of the Act, 21 U.S.C. section 360bbb-3(b)(1), unless the authorization is terminated or revoked.  Performed at [facility], [address] [place], [place] [address]   Culture, blood (Routine X 2) w Reflex to ID Panel     Status: None (Preliminary result)   Collection Time: 11/28/23  2:52 PM    Specimen: BLOOD  Result Value Ref Range Status   Specimen Description BLOOD BLOOD LEFT ARM  Final   Special Requests   Final    BOTTLES DRAWN AEROBIC AND ANAEROBIC Blood Culture adequate volume   Culture   Final    NO GROWTH < 24 HOURS Performed at [facility], [address] [place], [place] [address]    Report Status PENDING  Incomplete  Culture, blood (Routine X 2) w Reflex to ID Panel     Status: None (Preliminary result)   Collection Time: 11/28/23  2:52 PM   Specimen: BLOOD  Result Value Ref Range Status   Specimen Description BLOOD BLOOD RIGHT ARM  Final   Special Requests   Final    BOTTLES DRAWN AEROBIC AND ANAEROBIC Blood Culture adequate volume   Culture   Final    NO GROWTH < 24 HOURS Performed at [facility], [address] [place], [place] [address]    Report Status PENDING  Incomplete  Respiratory (~20 pathogens) panel by PCR     Status: None   Collection Time: 11/28/23  2:52 PM   Specimen: Nasopharyngeal Swab; Respiratory  Result Value Ref Range Status   Adenovirus NOT DETECTED NOT DETECTED Final   Coronavirus 229E NOT DETECTED NOT DETECTED Final    Comment: (NOTE) The Coronavirus on the Respiratory Panel, DOES NOT test for the novel  Coronavirus (2019 nCoV)    Coronavirus HKU1 NOT DETECTED NOT DETECTED Final   Coronavirus NL63 NOT DETECTED NOT DETECTED Final   Coronavirus OC43 NOT DETECTED NOT DETECTED Final   Metapneumovirus NOT DETECTED NOT DETECTED Final   Rhinovirus / Enterovirus NOT DETECTED NOT DETECTED Final   Influenza A NOT DETECTED NOT DETECTED Final   Influenza B NOT DETECTED NOT DETECTED Final   Parainfluenza Virus 1 NOT DETECTED NOT DETECTED Final   Parainfluenza Virus 2 NOT DETECTED NOT DETECTED Final   Parainfluenza Virus 3 NOT DETECTED NOT DETECTED Final   Parainfluenza Virus 4 NOT DETECTED NOT DETECTED Final   Respiratory Syncytial Virus NOT DETECTED NOT DETECTED Final   Bordetella pertussis NOT DETECTED NOT  DETECTED Final   Bordetella Parapertussis NOT DETECTED NOT DETECTED Final   Chlamydophila pneumoniae NOT DETECTED NOT DETECTED Final   Mycoplasma pneumoniae NOT DETECTED NOT DETECTED Final    Comment: Performed at [facility], [address] [place], [place] [address]     Radiology Studies: DG Knee Complete 4 Views Left Result Date: 11/27/2023 CLINICAL DATA:  Pain after fall EXAM: LEFT KNEE - COMPLETE 4 VIEW COMPARISON:  None Available. FINDINGS: No evidence of fracture, dislocation, or joint effusion. No evidence of arthropathy or other focal bone abnormality. Soft tissues are unremarkable. Artifact from the patient's brace. Vascular calcifications. IMPRESSION: Grossly no acute osseous abnormality. Artifact from the overlapping brace. Electronically Signed   By: Karen Kays M.D.   On: 11/27/2023 17:03   DG Hip Unilat W or Wo Pelvis 2-3 Views Left Result Date: 11/27/2023 CLINICAL DATA:  Pain after fall EXAM: DG HIP (WITH OR WITHOUT PELVIS) 3V LEFT COMPARISON:  None Available. FINDINGS: No fracture or dislocation. Preserved joint spaces. Only mild degenerative changes of the inferior aspect of the sacroiliac joints. Osteopenia. Presumed vascular calcifications in the pelvis. Mild degenerative changes of the visualized portions of the lumbar spine. With this level of osteopenia subtle nondisplaced injury is difficult to completely exclude and if needed additional cross-sectional imaging as clinically appropriate. Metallic focus overlying the perineum. Please correlate with history. IMPRESSION: Osteopenia. Mild degenerative changes. No acute osseous abnormality. Electronically Signed   By: Karen Kays M.D.   On: 11/27/2023 17:01   DG Ribs Unilateral W/Chest Left Result Date: 11/27/2023 CLINICAL DATA:  Pain after fall EXAM: LEFT RIBS AND CHEST - 4 VIEW COMPARISON:  Chest x-ray 07/16/2023 FINDINGS: Left upper chest battery pack. Enlarged cardiopericardial silhouette. Calcified aorta. Slight  prominence of the central vasculature. No pneumothorax or effusion. Subtle left lung base opacity. There is a question of a nondisplaced fracture of the lateral aspect of the left sixth rib. IMPRESSION: Question nondisplaced left 6 rib fracture. No  pneumothorax or effusion. Enlarged heart with pacemaker. Subtle left lung base opacity. Recommend follow-up Electronically Signed   By: Karen Kays M.D.   On: 11/27/2023 16:59   CT Cervical Spine Wo Contrast Result Date: 11/27/2023 CLINICAL DATA:  Neck trauma (Age >= 65y) EXAM: CT CERVICAL SPINE WITHOUT CONTRAST TECHNIQUE: Multidetector CT imaging of the cervical spine was performed without intravenous contrast. Multiplanar CT image reconstructions were also generated. RADIATION DOSE REDUCTION: This exam was performed according to the departmental dose-optimization program which includes automated exposure control, adjustment of the mA and/or kV according to patient size and/or use of iterative reconstruction technique. COMPARISON:  07/15/2021 FINDINGS: Alignment: Facet joints are aligned without dislocation or traumatic listhesis. Dens and lateral masses are aligned. Straightening of the cervical lordosis. Skull base and vertebrae: No acute fracture. No primary bone lesion or focal pathologic process. Soft tissues and spinal canal: No prevertebral fluid or swelling. No visible canal hematoma. Disc levels:  Advanced multilevel cervical spondylosis. Upper chest: Included lung apices are clear. Other: Bilateral carotid atherosclerosis. IMPRESSION: No acute fracture or traumatic listhesis of the cervical spine. Electronically Signed   By: Duanne Guess D.O.   On: 11/27/2023 16:52   CT Head Wo Contrast Result Date: 11/27/2023 CLINICAL DATA:  Head trauma, minor (Age >= 65y) EXAM: CT HEAD WITHOUT CONTRAST TECHNIQUE: Contiguous axial images were obtained from the base of the skull through the vertex without intravenous contrast. RADIATION DOSE REDUCTION: This exam was  performed according to the departmental dose-optimization program which includes automated exposure control, adjustment of the mA and/or kV according to patient size and/or use of iterative reconstruction technique. COMPARISON:  07/16/2023 FINDINGS: Brain: No evidence of acute infarction, hemorrhage, hydrocephalus, extra-axial collection or mass lesion/mass effect. Patchy low-density changes within the periventricular and subcortical white matter most compatible with chronic microvascular ischemic change. Moderate diffuse cerebral volume loss. Vascular: Atherosclerotic calcifications involving the large vessels of the skull base. No unexpected hyperdense vessel. Skull: Normal. Negative for fracture or focal lesion. Sinuses/Orbits: No acute finding. Other: Negative for scalp hematoma. IMPRESSION: 1. No acute intracranial findings. 2. 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 Electronically Signed   By: Duanne Guess D.O.   On: 11/27/2023 16:47    Scheduled Meds:  abacavir-dolutegravir-lamiVUDine  1 tablet Oral Daily   clopidogrel  75 mg Oral Daily   DULoxetine  30 mg Oral Daily   dutasteride  0.5 mg Oral Daily   enoxaparin (LOVENOX) injection  40 mg Subcutaneous Q24H   ezetimibe  10 mg Oral Daily   ketotifen  1 drop Both Eyes BID   metoprolol succinate  50 mg Oral Daily   pantoprazole  40 mg Oral Daily   tamsulosin  0.4 mg Oral Daily   [START ON 12/01/2023] Vitamin D (Ergocalciferol)  50,000 Units Oral Weekly   Continuous Infusions:  cefTRIAXone (ROCEPHIN)  IV 1 g (11/29/23 0048)     LOS: 2 days   Time spent: 40 minutes  Carollee Herter, DO  [facility]  11/29/2023, 1:13 PM

## 2023-11-29 NOTE — Assessment & Plan Note (Signed)
11-29-2023 stable 11-30-2023 stable. 12-01-2023 stable. 12-02-2023 stable

## 2023-11-29 NOTE — TOC Initial Note (Signed)
Transition of Care [facility]) - Initial/Assessment Note    Patient Details  Name: Brian [name] MRN: [id number] Date of Birth: November 21, 1940  Transition of [facility]) CM/SW Contact:    Maree Krabbe, LCSW Phone Number: 11/29/2023, 1:06 PM  Clinical Narrative:       SW spoke with pt's son. Pt's son is agreeable to SNF however does not have a preference for which facility yet. Pt's son is agreeable for SW to send out referral to [facility] facilities and follow up with bed offers so they know their choices.            Expected Discharge Plan: Skilled Nursing Facility Barriers to Discharge: Continued Medical Work up   Patient Goals and CMS Choice     Choice offered to / list presented to : Adult Children      Expected Discharge Plan and Services In-house Referral: Clinical Social Work   Post Acute Care Choice: Skilled Nursing Facility Living arrangements for the past 2 months: Single Family Home                                      Prior Living Arrangements/Services Living arrangements for the past 2 months: Single Family Home Lives with:: Self Patient language and need for interpreter reviewed:: Yes Do you feel safe going back to the place where you live?: Yes      Need for Family Participation in Patient Care: Yes (Comment) Care giver support system in place?: Yes (comment)   Criminal Activity/Legal Involvement Pertinent to Current Situation/Hospitalization: No - Comment as needed  Activities of Daily Living      Permission Sought/Granted Permission sought to share information with : Family Supports Permission granted to share information with : Yes, Verbal Permission Granted  Share Information with NAME: Lyn Hollingshead     Permission granted to share info w Relationship: son     Emotional Assessment Appearance:: Appears stated age Attitude/Demeanor/Rapport: Unable to Assess Affect (typically observed): Unable to Assess   Alcohol / Substance Use: Not  Applicable Psych Involvement: No (comment)  Admission diagnosis:  Influenza A [J10.1] Acute cystitis without hematuria [N30.00] Generalized weakness [R53.1] Patient Active Problem List   Diagnosis Date Noted   Generalized weakness 11/28/2023   Urinary tract infection 11/28/2023   Acute metabolic encephalopathy 11/28/2023   COVID-19 virus infection 07/17/2023   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   Sepsis (HCC) 07/17/2023   Conjunctivitis 04/27/2022   Stroke (HCC) 04/25/2022   Hyperlipidemia    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 [place] [address] Phone: [phone] Fax: [phone]     Social Drivers of Health (SDOH) Social History: SDOH Screenings   Food Insecurity: Patient Unable To Answer (11/29/2023)  Housing: Patient Unable To Answer (11/29/2023)  Transportation Needs: Patient Unable To Answer (11/29/2023)  Utilities: Patient Unable To Answer (11/29/2023)  Depression (PHQ2-9): Low Risk  (06/25/2022)  Social Connections: Patient Unable To Answer (11/29/2023)  Tobacco Use: Medium Risk (11/27/2023)   SDOH Interventions:     Readmission Risk Interventions     No data to display

## 2023-11-30 DIAGNOSIS — R531 Weakness: Secondary | ICD-10-CM

## 2023-11-30 DIAGNOSIS — N3000 Acute cystitis without hematuria: Secondary | ICD-10-CM | POA: Diagnosis not present

## 2023-11-30 DIAGNOSIS — S2232XA Fracture of one rib, left side, initial encounter for closed fracture: Secondary | ICD-10-CM

## 2023-11-30 DIAGNOSIS — N179 Acute kidney failure, unspecified: Secondary | ICD-10-CM | POA: Diagnosis not present

## 2023-11-30 DIAGNOSIS — G9341 Metabolic encephalopathy: Secondary | ICD-10-CM | POA: Diagnosis not present

## 2023-11-30 LAB — URINE CULTURE
Culture: >=100000 — AB
Special Requests: NORMAL

## 2023-11-30 MED ORDER — METOPROLOL SUCCINATE ER 25 MG PO TB24
25.0000 mg | ORAL_TABLET | Freq: Once | ORAL | Status: AC
  Administered 2023-11-30: 25 mg via ORAL
  Filled 2023-11-30: qty 1

## 2023-11-30 MED ORDER — METOPROLOL SUCCINATE ER 50 MG PO TB24
75.0000 mg | ORAL_TABLET | Freq: Every day | ORAL | Status: DC
  Administered 2023-12-01 – 2023-12-02 (×2): 75 mg via ORAL
  Filled 2023-11-30 (×2): qty 1

## 2023-11-30 NOTE — Plan of Care (Signed)

## 2023-11-30 NOTE — Progress Notes (Signed)
PROGRESS NOTE    Brian [name]  OZH:[id number] DOB: Oct 09, 1941 DOA: 11/27/2023 PCP: Josefina Do., MD  Subjective: Pt seen and examined.  No family at bedside. Urine cx growing Klebsiella and E. Coli. Awaiting final sensitives     Hospital Course: HPI: Brian [name] is a 83 y.o. male with medical history significant for HTN, HIV, prior CVA with right upper extremity weakness, COPD who presents to the ED with a 5-day history of weakness resulting in a fall, lethargy, sleeping a lot, decreased oral intake and confusion.  History is provided by son and daughter-in-law at bedside who stated that for the past 3 days he has been coughing and then on the day of arrival he developed diarrhea and shortness of breath.  They also noted him to be warm to the touch.   Significant Events: Admitted 11/27/2023 for UTI, acute metabolic encephalopathy   Significant Labs: WBC 11.1, HgB 17.3, Plt 282 Na 140, K 3.7, BUN 20, scr 1.29, glu 148 UA amber/turbid, + LE, +bacteria, neg Nitrite  Significant Imaging Studies: CT head shows No acute intracranial findings. 2. Chronic microvascular ischemic change and cerebral volume loss CT c-spine shows No acute fracture or traumatic listhesis of the cervical spine  Left Rib/CXR shows Question nondisplaced left 6 rib fracture. No pneumothorax or effusion. Enlarged heart with pacemaker. Subtle left lung base opacity. Left hip XR shows Osteopenia. Mild degenerative changes. No acute osseous abnormality.  Left knee XR shows Grossly no acute osseous abnormality. Artifact from the overlapping brace.  Antibiotic Therapy: Anti-infectives (From admission, onward)    Start     Dose/Rate Route Frequency Ordered Stop   11/28/23 2300  cefTRIAXone (ROCEPHIN) 1 g in sodium chloride 0.9 % 100 mL IVPB        1 g 200 mL/hr over 30 Minutes Intravenous Every 24 hours 11/28/23 0218     11/28/23 1000  abacavir-dolutegravir-lamiVUDine (TRIUMEQ) 600-50-300 MG per tablet 1 tablet         1 tablet Oral Daily 11/27/23 2238     11/28/23 1000  abacavir-dolutegravir-lamiVUDine (TRIUMEQ) 600-50-300 MG per tablet 1 tablet  Status:  Discontinued        1 tablet Oral Daily 11/28/23 0217 11/28/23 0221   11/27/23 2230  cefTRIAXone (ROCEPHIN) 1 g in sodium chloride 0.9 % 100 mL IVPB        1 g 200 mL/hr over 30 Minutes Intravenous  Once 11/27/23 2229 11/28/23 0018       Procedures:   Consultants:     Assessment and Plan: * Urinary tract infection 11-29-2023 urine cx growing GNR. Continue with IV Rocephin  11-30-2023 continue IV rocephin. Urine cx growing Klebsiella and E. Coli. Awaiting final sensitives   Acute metabolic encephalopathy 11-29-2023 Secondary to acute infection. Neurologic checks with fall and aspiration precautions. Appears better. 11-30-2023 stable. Needs SNF placement.  Generalized weakness 11-29-2023 pt has been assessed by PT. Pt may need SNF. TOC looking into this.  11-30-2023 stable. Needs SNF placement.  Essential hypertension 11-29-2023 Continue Toprol-XL 50 mg qday. Prn clonidine. May need to add additional meds or increase dose of Toprol-XL  11-30-2023 SBP elevated. Will increased to Toprol-XL 75 mg qday.    AKI (acute kidney injury) (HCC) 11-29-2023 resolved with IVF and treatment of UTI.  Encourage oral hydration. Monitor renal function and avoid nephrotoxins  Closed fracture of one rib of left side 11-29-2023 stable 11-30-2023 stable.  COPD (chronic obstructive pulmonary disease) (HCC) 11-29-2023 Not acutely exacerbated.  Albuterol as needed 11-30-2023  stable.  HIV (human immunodeficiency virus infection) (HCC) 11-29-2023 Continue Triumeq 11-30-2023 stable.       DVT prophylaxis: enoxaparin (LOVENOX) injection 40 mg Start: 11/28/23 0800    Code Status: Limited: Do not attempt resuscitation (DNR) -DNR-LIMITED -Do Not Intubate/DNI  Family Communication: no family at bedside Disposition Plan: SNF Reason for continuing  need for hospitalization: awaiting final urine cx results.  Objective: Vitals:   11/29/23 1452 11/29/23 2005 11/30/23 0329 11/30/23 0939  BP: (!) 144/85 (!) 145/87 (!) 158/98 (!) 157/87  Pulse: 74 74 66 68  Resp: 16 20 20 20   Temp: 97.7 F (36.5 C) 97.9 F (36.6 C) 98 F (36.7 C) 97.8 F (36.6 C)  TempSrc: Oral Oral Axillary Oral  SpO2: 96% 98% 98% 99%  Weight:      Height:        Intake/Output Summary (Last 24 hours) at 11/30/2023 1314 Last data filed at 11/30/2023 1610 Gross per 24 hour  Intake 198.77 ml  Output 200 ml  Net -1.23 ml   Filed Weights   11/27/23 1529  Weight: 77.1 kg    Examination:  Physical Exam Vitals and nursing note reviewed.  Constitutional:      General: He is not in acute distress.    Appearance: He is not toxic-appearing or diaphoretic.     Comments: Pleasantly dementea  HENT:     Head: Normocephalic and atraumatic.  Eyes:     General: No scleral icterus. Cardiovascular:     Rate and Rhythm: Normal rate and regular rhythm.  Pulmonary:     Effort: Pulmonary effort is normal.     Breath sounds: Normal breath sounds.  Abdominal:     General: Bowel sounds are normal.     Palpations: Abdomen is soft.  Musculoskeletal:     Right lower leg: No edema.     Left lower leg: No edema.  Skin:    General: Skin is warm and dry.     Capillary Refill: Capillary refill takes less than 2 seconds.  Neurological:     Mental Status: He is alert.   Data Reviewed: I have personally reviewed following labs and imaging studies  CBC: Recent Labs  Lab 11/27/23 1530 11/28/23 0336  WBC 11.1* 8.6  HGB 17.3* 15.0  HCT 48.4 43.4  MCV 88.0 91.2  PLT 282 206   Basic Metabolic Panel: Recent Labs  Lab 11/27/23 1530 11/28/23 0336  NA 140 142  K 3.7 3.9  CL 105 109  CO2 22 22  GLUCOSE 148* 111*  BUN 20 24*  CREATININE 1.29* 1.01  CALCIUM 9.6 8.8*   GFR: Estimated Creatinine Clearance: 55.1 mL/min (by C-G formula based on SCr of 1.01 mg/dL). Liver  Function Tests: Recent Labs  Lab 11/27/23 1530  AST 43*  ALT 27  ALKPHOS 51  BILITOT 1.1  PROT 8.4*  ALBUMIN 4.2   BNP (last 3 results) Recent Labs    07/16/23 2250  BNP 186.4*   Recent Results (from the past 240 hours)  Urine Culture     Status: Abnormal (Preliminary result)   Collection Time: 11/27/23  3:30 PM   Specimen: Urine, Random  Result Value Ref Range Status   Specimen Description   Final    URINE, RANDOM Performed at [facility], [address] [place], [place] [address]    Special Requests   Final    Normal Performed at [facility], [address] [place], [place] [address]    Culture (A)  Final    >=100,000 COLONIES/mL ESCHERICHIA COLI >=100,000 COLONIES/mL KLEBSIELLA PNEUMONIAE SUSCEPTIBILITIES TO FOLLOW Performed at [facility], [address], [place], [place] [address]    Report Status PENDING  Incomplete  Resp panel by RT-PCR (RSV, Flu A&B, Covid) Anterior Nasal Swab     Status: None   Collection Time: 11/27/23  9:54 PM   Specimen: Anterior Nasal Swab  Result Value Ref Range Status   SARS Coronavirus 2 by RT PCR NEGATIVE NEGATIVE Final    Comment: (NOTE) SARS-CoV-2 target nucleic acids are NOT DETECTED.  The SARS-CoV-2 RNA is generally detectable in upper respiratory specimens during the acute phase of infection. The lowest concentration of SARS-CoV-2 viral copies this assay can detect is 138 copies/mL. A negative result does not preclude SARS-Cov-2 infection and should not be used as the sole basis for treatment or other patient management decisions. A negative result may occur with  improper specimen collection/handling, submission of specimen other than nasopharyngeal swab, presence of viral mutation(s) within the areas targeted by this assay, and inadequate number of viral copies(<138 copies/mL). A negative result must be combined with clinical observations, patient history, and epidemiological information.  The expected result is Negative.  Fact Sheet for Patients:  BloggerCourse.com  Fact Sheet for Healthcare Providers:  SeriousBroker.it  This test is no t yet approved or cleared by the [place] FDA and  has been authorized for detection and/or diagnosis of SARS-CoV-2 by FDA under an Emergency Use Authorization (EUA). This EUA will remain  in effect (meaning this test can be used) for the duration of the COVID-19 declaration under Section 564(b)(1) of the Act, 21 U.S.C.section 360bbb-3(b)(1), unless the authorization is terminated  or revoked sooner.       Influenza A by PCR NEGATIVE NEGATIVE Final   Influenza B by PCR NEGATIVE NEGATIVE Final    Comment: (NOTE) The Xpert Xpress SARS-CoV-2/FLU/RSV plus assay is intended as an aid in the diagnosis of influenza from Nasopharyngeal swab specimens and should not be used as a sole basis for treatment. Nasal washings and aspirates are unacceptable for Xpert Xpress SARS-CoV-2/FLU/RSV testing.  Fact Sheet for Patients: BloggerCourse.com  Fact Sheet for Healthcare Providers: SeriousBroker.it  This test is not yet approved or cleared by the [place] FDA and has been authorized for detection and/or diagnosis of SARS-CoV-2 by FDA under an Emergency Use Authorization (EUA). This EUA will remain in effect (meaning this test can be used) for the duration of the COVID-19 declaration under Section 564(b)(1) of the Act, 21 U.S.C. section 360bbb-3(b)(1), unless the authorization is terminated or revoked.     Resp Syncytial Virus by PCR NEGATIVE NEGATIVE Final    Comment: (NOTE) Fact Sheet for Patients: BloggerCourse.com  Fact Sheet for Healthcare Providers: SeriousBroker.it  This test is not yet approved or cleared by the [place] FDA and has been authorized for detection and/or  diagnosis of SARS-CoV-2 by FDA under an Emergency Use Authorization (EUA). This EUA will remain in effect (meaning this test can be used) for the duration of the COVID-19 declaration under Section 564(b)(1) of the Act, 21 U.S.C. section 360bbb-3(b)(1), unless the authorization is terminated or revoked.  Performed at [facility], [address] [place], [place] [address]   Culture, blood (Routine X 2) w Reflex to ID Panel     Status: None (Preliminary result)   Collection Time: 11/28/23  2:52 PM   Specimen: BLOOD  Result Value Ref Range Status   Specimen Description BLOOD BLOOD LEFT  ARM  Final   Special Requests   Final    BOTTLES DRAWN AEROBIC AND ANAEROBIC Blood Culture adequate volume   Culture   Final    NO GROWTH 2 DAYS Performed at [facility], [address] [place], [place] [address]    Report Status PENDING  Incomplete  Culture, blood (Routine X 2) w Reflex to ID Panel     Status: None (Preliminary result)   Collection Time: 11/28/23  2:52 PM   Specimen: BLOOD  Result Value Ref Range Status   Specimen Description BLOOD BLOOD RIGHT ARM  Final   Special Requests   Final    BOTTLES DRAWN AEROBIC AND ANAEROBIC Blood Culture adequate volume   Culture   Final    NO GROWTH 2 DAYS Performed at [facility], [address], [place], [place] [address]    Report Status PENDING  Incomplete  Respiratory (~20 pathogens) panel by PCR     Status: None   Collection Time: 11/28/23  2:52 PM   Specimen: Nasopharyngeal Swab; Respiratory  Result Value Ref Range Status   Adenovirus NOT DETECTED NOT DETECTED Final   Coronavirus 229E NOT DETECTED NOT DETECTED Final    Comment: (NOTE) The Coronavirus on the Respiratory Panel, DOES NOT test for the novel  Coronavirus (2019 nCoV)    Coronavirus HKU1 NOT DETECTED NOT DETECTED Final   Coronavirus NL63 NOT DETECTED NOT DETECTED Final   Coronavirus OC43 NOT DETECTED NOT DETECTED Final   Metapneumovirus NOT  DETECTED NOT DETECTED Final   Rhinovirus / Enterovirus NOT DETECTED NOT DETECTED Final   Influenza A NOT DETECTED NOT DETECTED Final   Influenza B NOT DETECTED NOT DETECTED Final   Parainfluenza Virus 1 NOT DETECTED NOT DETECTED Final   Parainfluenza Virus 2 NOT DETECTED NOT DETECTED Final   Parainfluenza Virus 3 NOT DETECTED NOT DETECTED Final   Parainfluenza Virus 4 NOT DETECTED NOT DETECTED Final   Respiratory Syncytial Virus NOT DETECTED NOT DETECTED Final   Bordetella pertussis NOT DETECTED NOT DETECTED Final   Bordetella Parapertussis NOT DETECTED NOT DETECTED Final   Chlamydophila pneumoniae NOT DETECTED NOT DETECTED Final   Mycoplasma pneumoniae NOT DETECTED NOT DETECTED Final    Comment: Performed at [facility], [address] [place], [place] [address]     Radiology Studies: No results found.  Scheduled Meds:  abacavir-dolutegravir-lamiVUDine  1 tablet Oral Daily   clopidogrel  75 mg Oral Daily   DULoxetine  30 mg Oral Daily   dutasteride  0.5 mg Oral Daily   enoxaparin (LOVENOX) injection  40 mg Subcutaneous Q24H   ezetimibe  10 mg Oral Daily   ketotifen  1 drop Both Eyes BID   metoprolol succinate  25 mg Oral Once   [START ON 12/01/2023] metoprolol succinate  75 mg Oral Daily   pantoprazole  40 mg Oral Daily   tamsulosin  0.4 mg Oral Daily   [START ON 12/01/2023] Vitamin D (Ergocalciferol)  50,000 Units Oral Weekly   Continuous Infusions:  cefTRIAXone (ROCEPHIN)  IV Stopped (11/29/23 2237)     LOS: 3 days   Time spent: 40 minutes  Carollee Herter, DO  [facility]  11/30/2023, 1:14 PM

## 2023-12-01 DIAGNOSIS — N179 Acute kidney failure, unspecified: Secondary | ICD-10-CM | POA: Diagnosis not present

## 2023-12-01 DIAGNOSIS — N3000 Acute cystitis without hematuria: Secondary | ICD-10-CM | POA: Diagnosis not present

## 2023-12-01 DIAGNOSIS — G9341 Metabolic encephalopathy: Secondary | ICD-10-CM | POA: Diagnosis not present

## 2023-12-01 DIAGNOSIS — I10 Essential (primary) hypertension: Secondary | ICD-10-CM | POA: Diagnosis not present

## 2023-12-01 DIAGNOSIS — Z950 Presence of cardiac pacemaker: Secondary | ICD-10-CM

## 2023-12-01 LAB — COMPREHENSIVE METABOLIC PANEL
ALT: 28 U/L (ref 0–44)
AST: 30 U/L (ref 15–41)
Albumin: 3.4 g/dL — ABNORMAL LOW (ref 3.5–5.0)
Alkaline Phosphatase: 45 U/L (ref 38–126)
Anion gap: 10 (ref 5–15)
BUN: 19 mg/dL (ref 8–23)
CO2: 23 mmol/L (ref 22–32)
Calcium: 8.8 mg/dL — ABNORMAL LOW (ref 8.9–10.3)
Chloride: 105 mmol/L (ref 98–111)
Creatinine, Ser: 1.02 mg/dL (ref 0.61–1.24)
GFR, Estimated: >60 mL/min (ref >60)
Glucose, Bld: 97 mg/dL (ref 70–99)
Potassium: 3.8 mmol/L (ref 3.5–5.1)
Sodium: 138 mmol/L (ref 135–145)
Total Bilirubin: 1.1 mg/dL (ref 0.0–1.2)
Total Protein: 7.1 g/dL (ref 6.5–8.1)

## 2023-12-01 LAB — CBC WITH DIFFERENTIAL/PLATELET
Abs Immature Granulocytes: 0.03 10*3/uL (ref 0.00–0.07)
Basophils Absolute: 0.1 10*3/uL (ref 0.0–0.1)
Basophils Relative: 1 %
Eosinophils Absolute: 0.2 10*3/uL (ref 0.0–0.5)
Eosinophils Relative: 3 %
HCT: 42.2 % (ref 39.0–52.0)
Hemoglobin: 14.9 g/dL (ref 13.0–17.0)
Immature Granulocytes: 0 %
Lymphocytes Relative: 22 %
Lymphs Abs: 1.5 10*3/uL (ref 0.7–4.0)
MCH: 31.5 pg (ref 26.0–34.0)
MCHC: 35.3 g/dL (ref 30.0–36.0)
MCV: 89.2 fL (ref 80.0–100.0)
Monocytes Absolute: 0.6 10*3/uL (ref 0.1–1.0)
Monocytes Relative: 9 %
Neutro Abs: 4.4 10*3/uL (ref 1.7–7.7)
Neutrophils Relative %: 65 %
Platelets: 236 10*3/uL (ref 150–400)
RBC: 4.73 MIL/uL (ref 4.22–5.81)
RDW: 13.2 % (ref 11.5–15.5)
WBC: 6.8 10*3/uL (ref 4.0–10.5)
nRBC: 0 % (ref 0.0–0.2)

## 2023-12-01 MED ORDER — CEPHALEXIN 500 MG PO CAPS
500.0000 mg | ORAL_CAPSULE | Freq: Three times a day (TID) | ORAL | Status: DC
  Administered 2023-12-01 – 2023-12-02 (×4): 500 mg via ORAL
  Filled 2023-12-01 (×4): qty 1

## 2023-12-01 MED ORDER — CLONIDINE HCL 0.2 MG PO TABS: 0.2000 mg | ORAL_TABLET | ORAL | Status: DC | PRN

## 2023-12-01 MED ORDER — CEPHALEXIN 500 MG PO CAPS: 500.0000 mg | ORAL_CAPSULE | Freq: Three times a day (TID) | ORAL | Status: AC

## 2023-12-01 MED ORDER — TRAMADOL-ACETAMINOPHEN 37.5-325 MG PO TABS: 1.0000 | ORAL_TABLET | Freq: Every day | ORAL | 0 refills | Status: AC | PRN

## 2023-12-01 MED ORDER — METOPROLOL SUCCINATE ER 25 MG PO TB24: 75.0000 mg | ORAL_TABLET | Freq: Every day | ORAL | Status: DC

## 2023-12-01 NOTE — Progress Notes (Signed)
PROGRESS NOTE    Brian [name]  ZOX:[id number] DOB: 1941-05-10 DOA: 11/27/2023 PCP: Josefina Do., MD  Subjective: Pt seen and examined.  No family at bedside. Urine cx growing Klebsiella and E. Coli. Sensitive to rocephin and cefazolin. Afebrile. On RA.   Hospital Course: HPI: Brian [name] is a 83 y.o. male with medical history significant for HTN, HIV, prior CVA with right upper extremity weakness, COPD who presents to the ED with a 5-day history of weakness resulting in a fall, lethargy, sleeping a lot, decreased oral intake and confusion.  History is provided by son and daughter-in-law at bedside who stated that for the past 3 days he has been coughing and then on the day of arrival he developed diarrhea and shortness of breath.  They also noted him to be warm to the touch.   Significant Events: Admitted 11/27/2023 for UTI, acute metabolic encephalopathy   Significant Labs: WBC 11.1, HgB 17.3, Plt 282 Na 140, K 3.7, BUN 20, scr 1.29, glu 148 UA amber/turbid, + LE, +bacteria, neg Nitrite  Significant Imaging Studies: CT head shows No acute intracranial findings. 2. Chronic microvascular ischemic change and cerebral volume loss CT c-spine shows No acute fracture or traumatic listhesis of the cervical spine  Left Rib/CXR shows Question nondisplaced left 6 rib fracture. No pneumothorax or effusion. Enlarged heart with pacemaker. Subtle left lung base opacity. Left hip XR shows Osteopenia. Mild degenerative changes. No acute osseous abnormality.  Left knee XR shows Grossly no acute osseous abnormality. Artifact from the overlapping brace.  Antibiotic Therapy: Anti-infectives (From admission, onward)    Start     Dose/Rate Route Frequency Ordered Stop   11/28/23 2300  cefTRIAXone (ROCEPHIN) 1 g in sodium chloride 0.9 % 100 mL IVPB        1 g 200 mL/hr over 30 Minutes Intravenous Every 24 hours 11/28/23 0218     11/28/23 1000  abacavir-dolutegravir-lamiVUDine (TRIUMEQ)  600-50-300 MG per tablet 1 tablet        1 tablet Oral Daily 11/27/23 2238     11/28/23 1000  abacavir-dolutegravir-lamiVUDine (TRIUMEQ) 600-50-300 MG per tablet 1 tablet  Status:  Discontinued        1 tablet Oral Daily 11/28/23 0217 11/28/23 0221   11/27/23 2230  cefTRIAXone (ROCEPHIN) 1 g in sodium chloride 0.9 % 100 mL IVPB        1 g 200 mL/hr over 30 Minutes Intravenous  Once 11/27/23 2229 11/28/23 0018       Procedures:   Consultants:     Assessment and Plan: * Urinary tract infection 11-29-2023 urine cx growing GNR. Continue with IV Rocephin 11-30-2023 continue IV rocephin. Urine cx growing Klebsiella and E. Coli. Awaiting final sensitives   12-01-2023 klebsiella and e. Coli sensitive to rocephin and cefazolin. Change to po keflex. Stable for DC to SNF.  Acute metabolic encephalopathy 11-29-2023 Secondary to acute infection. Neurologic checks with fall and aspiration precautions. Appears better. 11-30-2023 stable. Needs SNF placement.  12-01-2023 resolved.  Generalized weakness 11-29-2023 pt has been assessed by PT. Pt may need SNF. TOC looking into this. 11-30-2023 stable. Needs SNF placement.  12-01-2023 stable for DC to SNF. CM aware.  Essential hypertension 11-29-2023 Continue Toprol-XL 50 mg qday. Prn clonidine. May need to add additional meds or increase dose of Toprol-XL 11-30-2023 SBP elevated. Will increased to Toprol-XL 75 mg qday.    12-01-2023 Toprol-XL just increased to 75 mg daily yesterday. Continue to monitor for effect. No new changes to BP meds.  Pt has PPM. Could increase dose of Toprol-XL if needed. His PPM is set for HR of 40.  AKI (acute kidney injury) (HCC) 11-29-2023 resolved with IVF and treatment of UTI.  Encourage oral hydration. Monitor renal function and avoid nephrotoxins  12-01-2023 BUN 19, Scr 1.02. stable.   Closed fracture of one rib of left side 11-29-2023 stable 11-30-2023 stable. 12-01-2023 stable.  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  COPD (chronic obstructive pulmonary disease) (HCC) 11-29-2023 Not acutely exacerbated.  Albuterol as needed 11-30-2023 stable.  12-01-2023 no wheezing. On RA. Stable.  HIV (human immunodeficiency virus infection) (HCC) 11-29-2023 Continue Triumeq 11-30-2023 stable.  12-01-2023 stable.   DVT prophylaxis: enoxaparin (LOVENOX) injection 40 mg Start: 11/28/23 0800    Code Status: Limited: Do not attempt resuscitation (DNR) -DNR-LIMITED -Do Not Intubate/DNI  Family Communication: no family at bedside Disposition Plan: SNF  Reason for continuing need for hospitalization: medically stable for DC.  Objective: Vitals:   11/30/23 1501 11/30/23 2019 12/01/23 0410 12/01/23 0735  BP: (!) 157/82 139/79 (!) 157/97 (!) 154/80  Pulse: 62 (!) 55 (!) 51 (!) 51  Resp: 20 20 18 19   Temp: 98 F (36.7 C) 97.7 F (36.5 C) 98 F (36.7 C) 97.6 F (36.4 C)  TempSrc: Oral Oral Oral Oral  SpO2: 96% 99% 98% 99%  Weight:      Height:        Intake/Output Summary (Last 24 hours) at 12/01/2023 1254 Last data filed at 12/01/2023 0715 Gross per 24 hour  Intake 120 ml  Output 150 ml  Net -30 ml   Filed Weights   11/27/23 1529  Weight: 77.1 kg    Examination:  Physical Exam Vitals and nursing note reviewed.  HENT:     Head: Normocephalic and atraumatic.     Nose: Nose normal.  Eyes:     General: No scleral icterus. Cardiovascular:     Rate and Rhythm: Normal rate and regular rhythm.  Pulmonary:     Effort: Pulmonary effort is normal.     Breath sounds: Normal breath sounds.  Abdominal:     General: Bowel sounds are normal.     Palpations: Abdomen is soft.     Tenderness: There is no abdominal tenderness.  Musculoskeletal:     Right lower leg: No edema.     Left lower leg: No edema.  Skin:    General: Skin is warm and dry.     Capillary Refill: Capillary refill takes less than 2 seconds.  Neurological:     Mental Status: He is alert.     Data  Reviewed: I have personally reviewed following labs and imaging studies  CBC: Recent Labs  Lab 11/27/23 1530 11/28/23 0336 12/01/23 0504  WBC 11.1* 8.6 6.8  NEUTROABS  --   --  4.4  HGB 17.3* 15.0 14.9  HCT 48.4 43.4 42.2  MCV 88.0 91.2 89.2  PLT 282 206 236   Basic Metabolic Panel: Recent Labs  Lab 11/27/23 1530 11/28/23 0336 12/01/23 0504  NA 140 142 138  K 3.7 3.9 3.8  CL 105 109 105  CO2 22 22 23   GLUCOSE 148* 111* 97  BUN 20 24* 19  CREATININE 1.29* 1.01 1.02  CALCIUM 9.6 8.8* 8.8*   GFR: Estimated Creatinine Clearance: 54.6 mL/min (by C-G formula based on SCr of 1.02 mg/dL). Liver Function Tests: Recent Labs  Lab 11/27/23 1530 12/01/23 0504  AST 43* 30  ALT 27 28  ALKPHOS 51 45  BILITOT 1.1 1.1  PROT 8.4* 7.1  ALBUMIN 4.2 3.4*   BNP (last 3 results) Recent Labs    07/16/23 2250  BNP 186.4*    Recent Results (from the past 240 hours)  Urine Culture     Status: Abnormal   Collection Time: 11/27/23  3:30 PM   Specimen: Urine, Random  Result Value Ref Range Status   Specimen Description   Final    URINE, RANDOM Performed at [facility], [address], [place], [place] [address]    Special Requests   Final    Normal Performed at [facility], [address] [place], [place] [address]    Culture (A)  Final    >=100,000 COLONIES/mL ESCHERICHIA COLI >=100,000 COLONIES/mL KLEBSIELLA PNEUMONIAE    Report Status 11/30/2023 FINAL  Final   Organism ID, Bacteria ESCHERICHIA COLI (A)  Final   Organism ID, Bacteria KLEBSIELLA PNEUMONIAE (A)  Final      Susceptibility   Escherichia coli - MIC*    AMPICILLIN >=32 RESISTANT Resistant     CEFAZOLIN 16 SENSITIVE Sensitive     CEFEPIME 0.25 SENSITIVE Sensitive     CEFTRIAXONE <=0.25 SENSITIVE Sensitive     CIPROFLOXACIN >=4 RESISTANT Resistant     GENTAMICIN >=16 RESISTANT Resistant     IMIPENEM <=0.25 SENSITIVE Sensitive     NITROFURANTOIN <=16 SENSITIVE Sensitive      TRIMETH/SULFA >=320 RESISTANT Resistant     AMPICILLIN/SULBACTAM >=32 RESISTANT Resistant     PIP/TAZO 8 SENSITIVE Sensitive ug/mL    * >=100,000 COLONIES/mL ESCHERICHIA COLI   Klebsiella pneumoniae - MIC*    AMPICILLIN RESISTANT Resistant     CEFAZOLIN <=4 SENSITIVE Sensitive     CEFEPIME <=0.12 SENSITIVE Sensitive     CEFTRIAXONE <=0.25 SENSITIVE Sensitive     CIPROFLOXACIN <=0.25 SENSITIVE Sensitive     GENTAMICIN <=1 SENSITIVE Sensitive     IMIPENEM <=0.25 SENSITIVE Sensitive     NITROFURANTOIN 128 RESISTANT Resistant     TRIMETH/SULFA <=20 SENSITIVE Sensitive     AMPICILLIN/SULBACTAM 4 SENSITIVE Sensitive     PIP/TAZO <=4 SENSITIVE Sensitive ug/mL    * >=100,000 COLONIES/mL KLEBSIELLA PNEUMONIAE  Resp panel by RT-PCR (RSV, Flu A&B, Covid) Anterior Nasal Swab     Status: None   Collection Time: 11/27/23  9:54 PM   Specimen: Anterior Nasal Swab  Result Value Ref Range Status   SARS Coronavirus 2 by RT PCR NEGATIVE NEGATIVE Final    Comment: (NOTE) SARS-CoV-2 target nucleic acids are NOT DETECTED.  The SARS-CoV-2 RNA is generally detectable in upper respiratory specimens during the acute phase of infection. The lowest concentration of SARS-CoV-2 viral copies this assay can detect is 138 copies/mL. A negative result does not preclude SARS-Cov-2 infection and should not be used as the sole basis for treatment or other patient management decisions. A negative result may occur with  improper specimen collection/handling, submission of specimen other than nasopharyngeal swab, presence of viral mutation(s) within the areas targeted by this assay, and inadequate number of viral copies(<138 copies/mL). A negative result must be combined with clinical observations, patient history, and epidemiological information. The expected result is Negative.  Fact Sheet for Patients:  BloggerCourse.com  Fact Sheet for Healthcare Providers:   SeriousBroker.it  This test is no t yet approved or cleared by the [place] FDA and  has been authorized for detection and/or diagnosis of SARS-CoV-2 by FDA under an Emergency Use Authorization (EUA). This EUA will remain  in effect (meaning this test can be used) for  the duration of the COVID-19 declaration under Section 564(b)(1) of the Act, 21 U.S.C.section 360bbb-3(b)(1), unless the authorization is terminated  or revoked sooner.       Influenza A by PCR NEGATIVE NEGATIVE Final   Influenza B by PCR NEGATIVE NEGATIVE Final    Comment: (NOTE) The Xpert Xpress SARS-CoV-2/FLU/RSV plus assay is intended as an aid in the diagnosis of influenza from Nasopharyngeal swab specimens and should not be used as a sole basis for treatment. Nasal washings and aspirates are unacceptable for Xpert Xpress SARS-CoV-2/FLU/RSV testing.  Fact Sheet for Patients: BloggerCourse.com  Fact Sheet for Healthcare Providers: SeriousBroker.it  This test is not yet approved or cleared by the [place] FDA and has been authorized for detection and/or diagnosis of SARS-CoV-2 by FDA under an Emergency Use Authorization (EUA). This EUA will remain in effect (meaning this test can be used) for the duration of the COVID-19 declaration under Section 564(b)(1) of the Act, 21 U.S.C. section 360bbb-3(b)(1), unless the authorization is terminated or revoked.     Resp Syncytial Virus by PCR NEGATIVE NEGATIVE Final    Comment: (NOTE) Fact Sheet for Patients: BloggerCourse.com  Fact Sheet for Healthcare Providers: SeriousBroker.it  This test is not yet approved or cleared by the [place] FDA and has been authorized for detection and/or diagnosis of SARS-CoV-2 by FDA under an Emergency Use Authorization (EUA). This EUA will remain in effect (meaning this test can be used) for  the duration of the COVID-19 declaration under Section 564(b)(1) of the Act, 21 U.S.C. section 360bbb-3(b)(1), unless the authorization is terminated or revoked.  Performed at [facility], [address] [place], [place] [address]   Culture, blood (Routine X 2) w Reflex to ID Panel     Status: None (Preliminary result)   Collection Time: 11/28/23  2:52 PM   Specimen: BLOOD  Result Value Ref Range Status   Specimen Description BLOOD BLOOD LEFT ARM  Final   Special Requests   Final    BOTTLES DRAWN AEROBIC AND ANAEROBIC Blood Culture adequate volume   Culture   Final    NO GROWTH 3 DAYS Performed at [facility], [address], [place], [place] [address]    Report Status PENDING  Incomplete  Culture, blood (Routine X 2) w Reflex to ID Panel     Status: None (Preliminary result)   Collection Time: 11/28/23  2:52 PM   Specimen: BLOOD  Result Value Ref Range Status   Specimen Description BLOOD BLOOD RIGHT ARM  Final   Special Requests   Final    BOTTLES DRAWN AEROBIC AND ANAEROBIC Blood Culture adequate volume   Culture   Final    NO GROWTH 3 DAYS Performed at [facility], [address] [place], [place] [address]    Report Status PENDING  Incomplete  Respiratory (~20 pathogens) panel by PCR     Status: None   Collection Time: 11/28/23  2:52 PM   Specimen: Nasopharyngeal Swab; Respiratory  Result Value Ref Range Status   Adenovirus NOT DETECTED NOT DETECTED Final   Coronavirus 229E NOT DETECTED NOT DETECTED Final    Comment: (NOTE) The Coronavirus on the Respiratory Panel, DOES NOT test for the novel  Coronavirus (2019 nCoV)    Coronavirus HKU1 NOT DETECTED NOT DETECTED Final   Coronavirus NL63 NOT DETECTED NOT DETECTED Final   Coronavirus OC43 NOT DETECTED NOT DETECTED Final   Metapneumovirus NOT DETECTED NOT DETECTED Final   Rhinovirus / Enterovirus NOT DETECTED NOT DETECTED  Final   Influenza A NOT DETECTED NOT DETECTED Final   Influenza  B NOT DETECTED NOT DETECTED Final   Parainfluenza Virus 1 NOT DETECTED NOT DETECTED Final   Parainfluenza Virus 2 NOT DETECTED NOT DETECTED Final   Parainfluenza Virus 3 NOT DETECTED NOT DETECTED Final   Parainfluenza Virus 4 NOT DETECTED NOT DETECTED Final   Respiratory Syncytial Virus NOT DETECTED NOT DETECTED Final   Bordetella pertussis NOT DETECTED NOT DETECTED Final   Bordetella Parapertussis NOT DETECTED NOT DETECTED Final   Chlamydophila pneumoniae NOT DETECTED NOT DETECTED Final   Mycoplasma pneumoniae NOT DETECTED NOT DETECTED Final    Comment: Performed at [facility], [address] [place], [place] [address]     Radiology Studies: No results found.  Scheduled Meds:  abacavir-dolutegravir-lamiVUDine  1 tablet Oral Daily   cephALEXin  500 mg Oral Q8H   clopidogrel  75 mg Oral Daily   DULoxetine  30 mg Oral Daily   dutasteride  0.5 mg Oral Daily   enoxaparin (LOVENOX) injection  40 mg Subcutaneous Q24H   ezetimibe  10 mg Oral Daily   ketotifen  1 drop Both Eyes BID   metoprolol succinate  75 mg Oral Daily   pantoprazole  40 mg Oral Daily   tamsulosin  0.4 mg Oral Daily   Vitamin D (Ergocalciferol)  50,000 Units Oral Weekly   Continuous Infusions:   LOS: 4 days   Time spent: 40 minutes  Carollee Herter, DO  [facility]  12/01/2023, 12:54 PM

## 2023-12-01 NOTE — Progress Notes (Addendum)
Physical Therapy Treatment Patient Details Name: Brian [name] MRN: [id number] DOB: Feb 25, 1941 Today's Date: 12/01/2023   History of Present Illness 83 y.o. male who presents to the ED with a 5-day history of weakness resulting in a fall, lethargy, decreased oral intake and confusion. Trauma imaging which included CT of ribs showing nondisplaced sixth rib fracture. MD assessment: UTI. PMHx: HTN, HIV, prior CVA with RUE weakness, COPD, OSA, PPM, HLD, COPD on prn 02 at night, depression, BPH    PT Comments  Pt ready to get up.  He is able to get to EOB with min a x 1.  Steady in sitting.  He is able to stand pivot to recliner at bedside.  After short seated rest, he does stand an additional 2 times with walker support and participates in pre-gait activities and standing exercises with walker support.  He does have decreased stance time and weakness in LLE from prior CVA.  While he is good with transfers, he is unsafe to progress gait away from chair with +1 assist. He does remain up in chair after session with needs met.     If plan is discharge home, recommend the following: A lot of help with bathing/dressing/bathroom;A lot of help with walking and/or transfers;Assist for transportation;Assistance with cooking/housework;Help with stairs or ramp for entrance   Can travel by private vehicle        Equipment Recommendations  None recommended by PT    Recommendations for Other Services       Precautions / Restrictions Precautions Precautions: Fall Restrictions Weight Bearing Restrictions Per Provider Order: No     Mobility  Bed Mobility Overal bed mobility: Needs Assistance Bed Mobility: Supine to Sit     Supine to sit: Min assist, HOB elevated, Used rails       Patient Response: Cooperative  Transfers Overall transfer level: Needs assistance Equipment used: Rolling walker (2 wheels), None Transfers: Sit to/from Stand, Bed to chair/wheelchair/BSC Sit to Stand: Contact guard  assist, Min assist   Step pivot transfers: Min assist            Ambulation/Gait         Gait velocity: dec     General Gait Details: pre-gait activities including weight shifting and marching in place.  generally unsafe to progress away from seated surface with +1 assist.   Stairs             Wheelchair Mobility     Tilt Bed Tilt Bed Patient Response: Cooperative  Modified Rankin (Stroke Patients Only)       Balance Overall balance assessment: History of Falls, Needs assistance Sitting-balance support: Bilateral upper extremity supported, Feet supported Sitting balance-Leahy Scale: Fair     Standing balance support: Bilateral upper extremity supported, Reliant on assistive device for balance, During functional activity, Single extremity supported Standing balance-Leahy Scale: Poor Standing balance comment: Min/CGA and use of RW for support in standing                            Cognition Arousal: Alert Behavior During Therapy: WFL for tasks assessed/performed Overall Cognitive Status: Within Functional Limits for tasks assessed                                 General Comments: able to follow basic cues for session without interpreter.  pt know to writer from prior admissions  Exercises Other Exercises Other Exercises: standing and seated AROM x 10    General Comments        Pertinent Vitals/Pain Pain Assessment Pain Assessment: No/denies pain Pain Intervention(s): Premedicated before session    Home Living                          Prior Function            PT Goals (current goals can now be found in the care plan section) Progress towards PT goals: Progressing toward goals    Frequency    Min 1X/week      PT Plan      Co-evaluation              AM-PAC PT "6 Clicks" Mobility   Outcome Measure  Help needed turning from your back to your side while in a flat bed without using  bedrails?: A Little Help needed moving from lying on your back to sitting on the side of a flat bed without using bedrails?: A Little Help needed moving to and from a bed to a chair (including a wheelchair)?: A Lot Help needed standing up from a chair using your arms (e.g., wheelchair or bedside chair)?: A Little Help needed to walk in hospital room?: A Lot Help needed climbing 3-5 steps with a railing? : Total 6 Click Score: 14    End of Session Equipment Utilized During Treatment: Gait belt Activity Tolerance: Patient tolerated treatment well;Patient limited by fatigue Patient left: in chair;with call bell/phone within reach;with chair alarm set Nurse Communication: Mobility status PT Visit Diagnosis: Unsteadiness on feet (R26.81);Other abnormalities of gait and mobility (R26.89);Difficulty in walking, not elsewhere classified (R26.2);Muscle weakness (generalized) (M62.81)     Time: 1000-1013 PT Time Calculation (min) (ACUTE ONLY): 13 min  Charges:    $Therapeutic Activity: 8-22 mins PT General Charges $$ ACUTE PT VISIT: 1 Visit                   Danielle Dess, PTA 12/01/23, 11:16 AM

## 2023-12-01 NOTE — Care Management Important Message (Signed)
Important Message  Patient Details  Name: Brian [name] MRN: [id number] Date of Birth: [birth year]   Important Message Given:  Yes - Medicare IM     Sherilyn Banker 12/01/2023, 1:52 PM

## 2023-12-01 NOTE — Discharge Summary (Signed)
 [facility] Phys[facility] Discharge Summary   Patient name: Brian [name]  Admit date:     11/27/2023  Discharge date: 12/02/2023  Attending Physician: CLEATUS DELAYNE GAILS [[id number]]  Discharge Physician: Camellia Door   PCP: Brian [name]., MD  Admitted From: Home  Disposition:   [facility]  Recommendations for Outpatient Follow-up:  Follow up with PCP in 1-2 weeks Follow with Infectious Disease clinic as scheduled  Home Health:No Equipment/Devices: None  Discharge Condition:Stable CODE STATUS:FULL Diet recommendation: Heart Healthy Fluid Restriction: None  Hospital Summary: HPI: Brian [name] is a 83 y.o. male with medical history significant for HTN, HIV, prior CVA with right upper extremity weakness, COPD who presents to the ED with a 5-day history of weakness resulting in a fall, lethargy, sleeping a lot, decreased oral intake and confusion.  History is provided by son and daughter-in-law at bedside who stated that for the past 3 days he has been coughing and then [name] the day of arrival he developed diarrhea and shortness of breath.  They also noted him to be warm to the touch.   Significant Events: Admitted 11/27/2023 for UTI, acute metabolic encephalopathy   Significant Labs: WBC 11.1, HgB 17.3, Plt 282 Na 140, K 3.7, BUN 20, scr 1.29, glu 148 UA amber/turbid, + LE, +bacteria, neg Nitrite  Significant Imaging Studies: CT head shows No acute intracranial findings. 2. Chronic microvascular ischemic change and cerebral volume loss CT c-spine shows No acute fracture or traumatic listhesis of the cervical spine  Left Rib/CXR shows Question nondisplaced left 6 rib fracture. No pneumothorax or effusion. Enlarged heart with pacemaker. Subtle left lung base opacity. Left hip XR shows Osteopenia. Mild degenerative changes. No acute osseous abnormality.  Left knee XR shows Grossly no acute osseous abnormality. Artifact from the overlapping brace.  Antibiotic  Therapy: Anti-infectives (From admission, onward)    Start     Dose/Rate Route Frequency Ordered Stop   11/28/23 2300  cefTRIAXone  (ROCEPHIN ) 1 g in sodium chloride  0.9 % 100 mL IVPB        1 g 200 mL/hr over 30 Minutes Intravenous Every 24 hours 11/28/23 0218     11/28/23 1000  abacavir -dolutegravir -lamiVUDine  (TRIUMEQ ) 600-50-300 MG per tablet 1 tablet        1 tablet Oral Daily 11/27/23 2238     11/28/23 1000  abacavir -dolutegravir -lamiVUDine  (TRIUMEQ ) 600-50-300 MG per tablet 1 tablet  Status:  Discontinued        1 tablet Oral Daily 11/28/23 0217 11/28/23 0221   11/27/23 2230  cefTRIAXone  (ROCEPHIN ) 1 g in sodium chloride  0.9 % 100 mL IVPB        1 g 200 mL/hr over 30 Minutes Intravenous  Once 11/27/23 2229 11/28/23 0018       Procedures:   Consultants:    Hospital Course by Problem: * E. coli UTI (urinary tract infection) 11-29-2023 urine cx growing GNR. Continue with IV Rocephin  11-30-2023 continue IV rocephin . Urine cx growing Klebsiella and E. Coli. Awaiting final sensitives   12-01-2023 klebsiella and e. Coli sensitive to rocephin  and cefazolin . Change to po keflex . Stable for DC to SNF.  Acute metabolic encephalopathy 11-29-2023 Secondary to acute infection. Neurologic checks with fall and aspiration precautions. Appears better. 11-30-2023 stable. Needs SNF placement.  12-01-2023 resolved.  Generalized weakness 11-29-2023 pt has been assessed by PT. Pt may need SNF. TOC looking into this. 11-30-2023 stable. Needs SNF placement.  12-01-2023 stable for DC to SNF. CM aware.  Essential hypertension 11-29-2023 Continue Toprol -XL  50 mg qday. Prn clonidine . May need to add additional meds or increase dose of Toprol -XL 11-30-2023 SBP elevated. Will increased to Toprol -XL 75 mg qday.    12-01-2023 Toprol -XL just increased to 75 mg daily yesterday. Continue to monitor for effect. No new changes to BP meds. Pt has PPM. Could increase dose of Toprol -XL if needed. His  PPM is set for HR of 40.  AKI (acute kidney injury) (HCC) 11-29-2023 resolved with IVF and treatment of UTI.  Encourage oral hydration. Monitor renal function and avoid nephrotoxins  12-01-2023 BUN 19, Scr 1.02. stable.   Closed fracture of one rib of left side 11-29-2023 stable 11-30-2023 stable. 12-01-2023 stable.  Cardiac pacemaker 12-01-2023 stable.  COPD (chronic obstructive pulmonary disease) (HCC) 11-29-2023 Not acutely exacerbated.  Albuterol  as needed 11-30-2023 stable.  12-01-2023 no wheezing. [name] RA. Stable.  HIV (human immunodeficiency virus infection) (HCC) 11-29-2023 Continue Triumeq  11-30-2023 stable.  12-01-2023 stable.    Discharge Diagnoses:  Principal Problem:   E. coli UTI (urinary tract infection) Active Problems:   AKI (acute kidney injury) (HCC)   Essential hypertension   Generalized weakness   Acute metabolic encephalopathy   HIV (human immunodeficiency virus infection) (HCC)   COPD (chronic obstructive pulmonary disease) (HCC)   Cardiac pacemaker   Closed fracture of one rib of left side   Discharge Instructions  Discharge Instructions     Call MD for:  difficulty breathing, headache or visual disturbances   Complete by: As directed    Call MD for:  extreme fatigue   Complete by: As directed    Call MD for:  hives   Complete by: As directed    Call MD for:  persistant dizziness or light-headedness   Complete by: As directed    Call MD for:  persistant nausea and vomiting   Complete by: As directed    Call MD for:  redness, tenderness, or signs of infection (pain, swelling, redness, odor or green/yellow discharge around incision site)   Complete by: As directed    Call MD for:  severe uncontrolled pain   Complete by: As directed    Call MD for:  temperature >100.4   Complete by: As directed    Diet - low sodium heart healthy   Complete by: As directed    Discharge instructions   Complete by: As directed    1. Follow up with  Infectious Disease clinic as scheduled   Increase activity slowly   Complete by: As directed       Allergies as of 12/02/2023   No Known Allergies      Medication List     STOP taking these medications    clonazePAM  0.5 MG tablet Commonly known as: KLONOPIN        TAKE these medications    alendronate  70 MG tablet Commonly known as: FOSAMAX  Take 70 mg by mouth once a week. Take [name] Saturday.   azelastine  0.05 % ophthalmic solution Commonly known as: OPTIVAR  Apply 1 drop to eye 2 (two) times daily.   cephALEXin  500 MG capsule Commonly known as: KEFLEX  Take 1 capsule (500 mg total) by mouth every 8 (eight) hours for 5 days.   cloNIDine  0.2 MG tablet Commonly known as: CATAPRES  Take 1 tablet (0.2 mg total) by mouth every 4 (four) hours as needed (SBP >170 or DBP >100).   clopidogrel  75 MG tablet Commonly known as: PLAVIX  Take 75 mg by mouth daily.   DULoxetine  30 MG capsule Commonly known as: CYMBALTA  Take  30 mg by mouth daily.   dutasteride  0.5 MG capsule Commonly known as: AVODART  Take 1 capsule (0.5 mg total) by mouth daily.   ezetimibe  10 MG tablet Commonly known as: ZETIA  Take 10 mg by mouth daily.   loratadine  10 MG tablet Commonly known as: CLARITIN  Take 10 mg by mouth daily.   metoprolol  succinate 25 MG 24 hr tablet Commonly known as: TOPROL -XL Take 3 tablets (75 mg total) by mouth daily. What changed:  how much to take when to take this   pantoprazole  40 MG tablet Commonly known as: PROTONIX  Take 1 tablet (40 mg total) by mouth daily.   simethicone  80 MG chewable tablet Commonly known as: MYLICON Chew 1 tablet (80 mg total) by mouth every 6 (six) hours as needed for flatulence.   tamsulosin  0.4 MG Caps capsule Commonly known as: FLOMAX  Take 1 capsule (0.4 mg total) by mouth daily.   traMADol -acetaminophen  37.5-325 MG tablet Commonly known as: ULTRACET  Take 1 tablet by mouth daily as needed.   Triumeq  600-50-300 MG tablet Generic  drug: abacavir -dolutegravir -lamiVUDine  Tome 1 tableta por va oral diariamente. (Take 1 tablet by mouth daily.)   Ventolin  HFA 108 (90 Base) MCG/ACT inhaler Generic drug: albuterol  Inhale 2 puffs into the lungs 4 (four) times daily as needed. For shortness of breath and/or wheezing.   Vitamin D  (Ergocalciferol ) 1.25 MG (50000 UNIT) Caps capsule Commonly known as: DRISDOL  Take 50,000 Units by mouth once a week.        Contact information for after-discharge care     Destination     [facility] .   Service: Skilled Nursing Contact information: [address] [place]  [phone]                    No Known Allergies  Discharge Exam: Vitals:   12/02/23 0359 12/02/23 0830  BP: (!) 162/92 (!) 157/99  Pulse: 63 67  Resp:  18  Temp:  98 F (36.7 C)  SpO2:  95%    Physical Exam Vitals and nursing note reviewed.  Constitutional:      General: He is not in acute distress.    Appearance: He is not toxic-appearing or diaphoretic.  HENT:     Head: Normocephalic and atraumatic.  Eyes:     General: No scleral icterus. Cardiovascular:     Rate and Rhythm: Normal rate and regular rhythm.  Pulmonary:     Effort: Pulmonary effort is normal. No respiratory distress.     Breath sounds: Normal breath sounds. No wheezing or rales.  Abdominal:     General: Abdomen is flat. Bowel sounds are normal.     Palpations: Abdomen is soft.  Musculoskeletal:     Right lower leg: No edema.     Left lower leg: No edema.  Skin:    General: Skin is warm and dry.  Neurological:     Mental Status: He is alert.     The results of significant diagnostics from this hospitalization (including imaging, microbiology, ancillary and laboratory) are listed below for reference.    Microbiology: Recent Results (from the past 240 hours)  Urine Culture     Status: Abnormal   Collection Time: 11/27/23  3:30 PM   Specimen: Urine, Random  Result  Value Ref Range Status   Specimen Description   Final    URINE, RANDOM Performed at [facility], [address] [place], [place] [address]    Special Requests   Final  Normal Performed at [facility], [address] [place], [place] [address]    Culture (A)  Final    >=100,000 COLONIES/mL ESCHERICHIA COLI >=100,000 COLONIES/mL KLEBSIELLA PNEUMONIAE    Report Status 11/30/2023 FINAL  Final   Organism ID, Bacteria ESCHERICHIA COLI (A)  Final   Organism ID, Bacteria KLEBSIELLA PNEUMONIAE (A)  Final      Susceptibility   Escherichia coli - MIC*    AMPICILLIN >=32 RESISTANT Resistant     CEFAZOLIN  16 SENSITIVE Sensitive     CEFEPIME 0.25 SENSITIVE Sensitive     CEFTRIAXONE  <=0.25 SENSITIVE Sensitive     CIPROFLOXACIN  >=4 RESISTANT Resistant     GENTAMICIN  >=16 RESISTANT Resistant     IMIPENEM <=0.25 SENSITIVE Sensitive     NITROFURANTOIN  <=16 SENSITIVE Sensitive     TRIMETH /SULFA  >=320 RESISTANT Resistant     AMPICILLIN/SULBACTAM >=32 RESISTANT Resistant     PIP/TAZO 8 SENSITIVE Sensitive ug/mL    * >=100,000 COLONIES/mL ESCHERICHIA COLI   Klebsiella pneumoniae - MIC*    AMPICILLIN RESISTANT Resistant     CEFAZOLIN  <=4 SENSITIVE Sensitive     CEFEPIME <=0.12 SENSITIVE Sensitive     CEFTRIAXONE  <=0.25 SENSITIVE Sensitive     CIPROFLOXACIN  <=0.25 SENSITIVE Sensitive     GENTAMICIN  <=1 SENSITIVE Sensitive     IMIPENEM <=0.25 SENSITIVE Sensitive     NITROFURANTOIN  128 RESISTANT Resistant     TRIMETH /SULFA  <=20 SENSITIVE Sensitive     AMPICILLIN/SULBACTAM 4 SENSITIVE Sensitive     PIP/TAZO <=4 SENSITIVE Sensitive ug/mL    * >=100,000 COLONIES/mL KLEBSIELLA PNEUMONIAE  Resp panel by RT-PCR (RSV, Flu A&B, Covid) Anterior Nasal Swab     Status: None   Collection Time: 11/27/23  9:54 PM   Specimen: Anterior Nasal Swab  Result Value Ref Range Status   SARS Coronavirus 2 by RT PCR NEGATIVE NEGATIVE Final    Comment: (NOTE) SARS-CoV-2 target nucleic acids are  NOT DETECTED.  The SARS-CoV-2 RNA is generally detectable in upper respiratory specimens during the acute phase of infection. The lowest concentration of SARS-CoV-2 viral copies this assay can detect is 138 copies/mL. A negative result does not preclude SARS-Cov-2 infection and should not be used as the sole basis for treatment or other patient management decisions. A negative result may occur with  improper specimen collection/handling, submission of specimen other than nasopharyngeal swab, presence of viral mutation(s) within the areas targeted by this assay, and inadequate number of viral copies(<138 copies/mL). A negative result must be combined with clinical observations, patient history, and epidemiological information. The expected result is Negative.  Fact Sheet for Patients:  bloggercourse.com  Fact Sheet for Healthcare Providers:  seriousbroker.it  This test is no t yet approved or cleared by the [place]  FDA and  has been authorized for detection and/or diagnosis of SARS-CoV-2 by FDA under an Emergency Use Authorization (EUA). This EUA will remain  in effect (meaning this test can be used) for the duration of the COVID-19 declaration under Section 564(b)(1) of the Act, 21 U.S.C.section 360bbb-3(b)(1), unless the authorization is terminated  or revoked sooner.       Influenza A by PCR NEGATIVE NEGATIVE Final   Influenza B by PCR NEGATIVE NEGATIVE Final    Comment: (NOTE) The Xpert Xpress SARS-CoV-2/FLU/RSV plus assay is intended as an aid in the diagnosis of influenza from Nasopharyngeal swab specimens and should not be used as a sole basis for treatment. Nasal washings and aspirates are unacceptable for Xpert Xpress SARS-CoV-2/FLU/RSV testing.  Fact Sheet for  Patients: bloggercourse.com  Fact Sheet for Healthcare Providers: seriousbroker.it  This test is not  yet approved or cleared by the [place]  FDA and has been authorized for detection and/or diagnosis of SARS-CoV-2 by FDA under an Emergency Use Authorization (EUA). This EUA will remain in effect (meaning this test can be used) for the duration of the COVID-19 declaration under Section 564(b)(1) of the Act, 21 U.S.C. section 360bbb-3(b)(1), unless the authorization is terminated or revoked.     Resp Syncytial Virus by PCR NEGATIVE NEGATIVE Final    Comment: (NOTE) Fact Sheet for Patients: bloggercourse.com  Fact Sheet for Healthcare Providers: seriousbroker.it  This test is not yet approved or cleared by the [place]  FDA and has been authorized for detection and/or diagnosis of SARS-CoV-2 by FDA under an Emergency Use Authorization (EUA). This EUA will remain in effect (meaning this test can be used) for the duration of the COVID-19 declaration under Section 564(b)(1) of the Act, 21 U.S.C. section 360bbb-3(b)(1), unless the authorization is terminated or revoked.  Performed at [facility], [address] [place], [place] [address]   Culture, blood (Routine X 2) w Reflex to ID Panel     Status: None (Preliminary result)   Collection Time: 11/28/23  2:52 PM   Specimen: BLOOD  Result Value Ref Range Status   Specimen Description BLOOD BLOOD LEFT ARM  Final   Special Requests   Final    BOTTLES DRAWN AEROBIC AND ANAEROBIC Blood Culture adequate volume   Culture   Final    NO GROWTH 4 DAYS Performed at [facility], [address], [place], [place] [address]    Report Status PENDING  Incomplete  Culture, blood (Routine X 2) w Reflex to ID Panel     Status: None (Preliminary result)   Collection Time: 11/28/23  2:52 PM   Specimen: BLOOD  Result Value Ref Range Status   Specimen Description BLOOD BLOOD RIGHT ARM  Final   Special Requests   Final    BOTTLES DRAWN AEROBIC AND ANAEROBIC Blood Culture  adequate volume   Culture   Final    NO GROWTH 4 DAYS Performed at [facility], [address] [place], [place] [address]    Report Status PENDING  Incomplete  Respiratory (~20 pathogens) panel by PCR     Status: None   Collection Time: 11/28/23  2:52 PM   Specimen: Nasopharyngeal Swab; Respiratory  Result Value Ref Range Status   Adenovirus NOT DETECTED NOT DETECTED Final   Coronavirus 229E NOT DETECTED NOT DETECTED Final    Comment: (NOTE) The Coronavirus [name] the Respiratory Panel, DOES NOT test for the novel  Coronavirus (2019 nCoV)    Coronavirus HKU1 NOT DETECTED NOT DETECTED Final   Coronavirus NL63 NOT DETECTED NOT DETECTED Final   Coronavirus OC43 NOT DETECTED NOT DETECTED Final   Metapneumovirus NOT DETECTED NOT DETECTED Final   Rhinovirus / Enterovirus NOT DETECTED NOT DETECTED Final   Influenza A NOT DETECTED NOT DETECTED Final   Influenza B NOT DETECTED NOT DETECTED Final   Parainfluenza Virus 1 NOT DETECTED NOT DETECTED Final   Parainfluenza Virus 2 NOT DETECTED NOT DETECTED Final   Parainfluenza Virus 3 NOT DETECTED NOT DETECTED Final   Parainfluenza Virus 4 NOT DETECTED NOT DETECTED Final   Respiratory Syncytial Virus NOT DETECTED NOT DETECTED Final   Bordetella pertussis NOT DETECTED NOT DETECTED Final   Bordetella Parapertussis NOT DETECTED NOT DETECTED Final   Chlamydophila pneumoniae NOT DETECTED NOT DETECTED Final  Mycoplasma pneumoniae NOT DETECTED NOT DETECTED Final    Comment: Performed at [facility], [address], [place], [place] [address]     Labs: BNP (last 3 results) Recent Labs    07/16/23 2250  BNP 186.4*   Basic Metabolic Panel: Recent Labs  Lab 11/27/23 1530 11/28/23 0336 12/01/23 0504  NA 140 142 138  K 3.7 3.9 3.8  CL 105 109 105  CO2 22 22 23   GLUCOSE 148* 111* 97  BUN 20 24* 19  CREATININE 1.29* 1.01 1.02  CALCIUM  9.6 8.8* 8.8*   Liver Function Tests: Recent Labs  Lab 11/27/23 1530 12/01/23 0504   AST 43* 30  ALT 27 28  ALKPHOS 51 45  BILITOT 1.1 1.1  PROT 8.4* 7.1  ALBUMIN 4.2 3.4*   No results for input(s): LIPASE, AMYLASE in the last 168 hours. No results for input(s): AMMONIA in the last 168 hours. CBC: Recent Labs  Lab 11/27/23 1530 11/28/23 0336 12/01/23 0504  WBC 11.1* 8.6 6.8  NEUTROABS  --   --  4.4  HGB 17.3* 15.0 14.9  HCT 48.4 43.4 42.2  MCV 88.0 91.2 89.2  PLT 282 206 236   Cardiac Enzymes: No results for input(s): CKTOTAL, CKMB, CKMBINDEX, TROPONINI in the last 168 hours. BNP: No results for input(s): BNP in the last 168 hours. CBG: No results for input(s): GLUCAP in the last 168 hours. D-Dimer No results for input(s): DDIMER in the last 72 hours. Hgb A1c No results for input(s): HGBA1C in the last 72 hours. Lipid Profile No results for input(s): CHOL, HDL, LDLCALC, TRIG, CHOLHDL, LDLDIRECT in the last 72 hours. Thyroid function studies No results for input(s): TSH, T4TOTAL, FREET4, T3FREE, THYROIDAB in the last 72 hours.  Invalid input(s): FREET3 Anemia work up No results for input(s): VITAMINB12, FOLATE, FERRITIN, TIBC, IRON, RETICCTPCT in the last 72 hours. Urinalysis    Component Value Date/Time   COLORURINE AMBER (A) 11/27/2023 1531   APPEARANCEUR TURBID (A) 11/27/2023 1531   APPEARANCEUR Cloudy (A) 10/25/2020 1303   LABSPEC 1.013 11/27/2023 1531   LABSPEC 1.015 11/09/2012 1958   PHURINE 5.0 11/27/2023 1531   GLUCOSEU NEGATIVE 11/27/2023 1531   GLUCOSEU Negative 11/09/2012 1958   HGBUR NEGATIVE 11/27/2023 1531   BILIRUBINUR NEGATIVE 11/27/2023 1531   BILIRUBINUR Negative 10/25/2020 1303   BILIRUBINUR Negative 11/09/2012 1958   KETONESUR NEGATIVE 11/27/2023 1531   PROTEINUR 100 (A) 11/27/2023 1531   NITRITE NEGATIVE 11/27/2023 1531   LEUKOCYTESUR LARGE (A) 11/27/2023 1531   LEUKOCYTESUR 3+ 11/09/2012 1958   Sepsis Labs Recent Labs  Lab 11/27/23 1530 11/28/23 0336  12/01/23 0504  WBC 11.1* 8.6 6.8   Microbiology Recent Results (from the past 240 hours)  Urine Culture     Status: Abnormal   Collection Time: 11/27/23  3:30 PM   Specimen: Urine, Random  Result Value Ref Range Status   Specimen Description   Final    URINE, RANDOM Performed at [facility], [address] [name], [place] [address]    Special Requests   Final    Normal Performed at [facility], [address] [place], [place] [address]    Culture (A)  Final    >=100,000 COLONIES/mL ESCHERICHIA COLI >=100,000 COLONIES/mL KLEBSIELLA PNEUMONIAE    Report Status 11/30/2023 FINAL  Final   Organism ID, Bacteria ESCHERICHIA COLI (A)  Final   Organism ID, Bacteria KLEBSIELLA PNEUMONIAE (A)  Final      Susceptibility   Escherichia coli -  MIC*    AMPICILLIN >=32 RESISTANT Resistant     CEFAZOLIN  16 SENSITIVE Sensitive     CEFEPIME 0.25 SENSITIVE Sensitive     CEFTRIAXONE  <=0.25 SENSITIVE Sensitive     CIPROFLOXACIN  >=4 RESISTANT Resistant     GENTAMICIN  >=16 RESISTANT Resistant     IMIPENEM <=0.25 SENSITIVE Sensitive     NITROFURANTOIN  <=16 SENSITIVE Sensitive     TRIMETH /SULFA  >=320 RESISTANT Resistant     AMPICILLIN/SULBACTAM >=32 RESISTANT Resistant     PIP/TAZO 8 SENSITIVE Sensitive ug/mL    * >=100,000 COLONIES/mL ESCHERICHIA COLI   Klebsiella pneumoniae - MIC*    AMPICILLIN RESISTANT Resistant     CEFAZOLIN  <=4 SENSITIVE Sensitive     CEFEPIME <=0.12 SENSITIVE Sensitive     CEFTRIAXONE  <=0.25 SENSITIVE Sensitive     CIPROFLOXACIN  <=0.25 SENSITIVE Sensitive     GENTAMICIN  <=1 SENSITIVE Sensitive     IMIPENEM <=0.25 SENSITIVE Sensitive     NITROFURANTOIN  128 RESISTANT Resistant     TRIMETH /SULFA  <=20 SENSITIVE Sensitive     AMPICILLIN/SULBACTAM 4 SENSITIVE Sensitive     PIP/TAZO <=4 SENSITIVE Sensitive ug/mL    * >=100,000 COLONIES/mL KLEBSIELLA PNEUMONIAE  Resp panel by RT-PCR (RSV, Flu A&B, Covid) Anterior Nasal Swab     Status: None    Collection Time: 11/27/23  9:54 PM   Specimen: Anterior Nasal Swab  Result Value Ref Range Status   SARS Coronavirus 2 by RT PCR NEGATIVE NEGATIVE Final    Comment: (NOTE) SARS-CoV-2 target nucleic acids are NOT DETECTED.  The SARS-CoV-2 RNA is generally detectable in upper respiratory specimens during the acute phase of infection. The lowest concentration of SARS-CoV-2 viral copies this assay can detect is 138 copies/mL. A negative result does not preclude SARS-Cov-2 infection and should not be used as the sole basis for treatment or other patient management decisions. A negative result may occur with  improper specimen collection/handling, submission of specimen other than nasopharyngeal swab, presence of viral mutation(s) within the areas targeted by this assay, and inadequate number of viral copies(<138 copies/mL). A negative result must be combined with clinical observations, patient history, and epidemiological information. The expected result is Negative.  Fact Sheet for Patients:  bloggercourse.com  Fact Sheet for Healthcare Providers:  seriousbroker.it  This test is no t yet approved or cleared by the [place]  FDA and  has been authorized for detection and/or diagnosis of SARS-CoV-2 by FDA under an Emergency Use Authorization (EUA). This EUA will remain  in effect (meaning this test can be used) for the duration of the COVID-19 declaration under Section 564(b)(1) of the Act, 21 U.S.C.section 360bbb-3(b)(1), unless the authorization is terminated  or revoked sooner.       Influenza A by PCR NEGATIVE NEGATIVE Final   Influenza B by PCR NEGATIVE NEGATIVE Final    Comment: (NOTE) The Xpert Xpress SARS-CoV-2/FLU/RSV plus assay is intended as an aid in the diagnosis of influenza from Nasopharyngeal swab specimens and should not be used as a sole basis for treatment. Nasal washings and aspirates are unacceptable for  Xpert Xpress SARS-CoV-2/FLU/RSV testing.  Fact Sheet for Patients: bloggercourse.com  Fact Sheet for Healthcare Providers: seriousbroker.it  This test is not yet approved or cleared by the [place]  FDA and has been authorized for detection and/or diagnosis of SARS-CoV-2 by FDA under an Emergency Use Authorization (EUA). This EUA will remain in effect (meaning this test can be used) for the duration of the COVID-19 declaration under Section 564(b)(1) of the Act, 21 U.S.C. section 360bbb-3(b)(1),  unless the authorization is terminated or revoked.     Resp Syncytial Virus by PCR NEGATIVE NEGATIVE Final    Comment: (NOTE) Fact Sheet for Patients: bloggercourse.com  Fact Sheet for Healthcare Providers: seriousbroker.it  This test is not yet approved or cleared by the [place]  FDA and has been authorized for detection and/or diagnosis of SARS-CoV-2 by FDA under an Emergency Use Authorization (EUA). This EUA will remain in effect (meaning this test can be used) for the duration of the COVID-19 declaration under Section 564(b)(1) of the Act, 21 U.S.C. section 360bbb-3(b)(1), unless the authorization is terminated or revoked.  Performed at [facility] [address] [address] [place], [place] [address]   Culture, blood (Routine X 2) w Reflex to ID Panel     Status: None (Preliminary result)   Collection Time: 11/28/23  2:52 PM   Specimen: BLOOD  Result Value Ref Range Status   Specimen Description BLOOD BLOOD LEFT ARM  Final   Special Requests   Final    BOTTLES DRAWN AEROBIC AND ANAEROBIC Blood Culture adequate volume   Culture   Final    NO GROWTH 4 DAYS Performed at [facility], [address] [place], [place] [address]    Report Status PENDING  Incomplete  Culture, blood (Routine X 2) w Reflex to ID Panel     Status: None (Preliminary result)    Collection Time: 11/28/23  2:52 PM   Specimen: BLOOD  Result Value Ref Range Status   Specimen Description BLOOD BLOOD RIGHT ARM  Final   Special Requests   Final    BOTTLES DRAWN AEROBIC AND ANAEROBIC Blood Culture adequate volume   Culture   Final    NO GROWTH 4 DAYS Performed at [facility], [address] [place], [place] [address]    Report Status PENDING  Incomplete  Respiratory (~20 pathogens) panel by PCR     Status: None   Collection Time: 11/28/23  2:52 PM   Specimen: Nasopharyngeal Swab; Respiratory  Result Value Ref Range Status   Adenovirus NOT DETECTED NOT DETECTED Final   Coronavirus 229E NOT DETECTED NOT DETECTED Final    Comment: (NOTE) The Coronavirus [name] the Respiratory Panel, DOES NOT test for the novel  Coronavirus (2019 nCoV)    Coronavirus HKU1 NOT DETECTED NOT DETECTED Final   Coronavirus NL63 NOT DETECTED NOT DETECTED Final   Coronavirus OC43 NOT DETECTED NOT DETECTED Final   Metapneumovirus NOT DETECTED NOT DETECTED Final   Rhinovirus / Enterovirus NOT DETECTED NOT DETECTED Final   Influenza A NOT DETECTED NOT DETECTED Final   Influenza B NOT DETECTED NOT DETECTED Final   Parainfluenza Virus 1 NOT DETECTED NOT DETECTED Final   Parainfluenza Virus 2 NOT DETECTED NOT DETECTED Final   Parainfluenza Virus 3 NOT DETECTED NOT DETECTED Final   Parainfluenza Virus 4 NOT DETECTED NOT DETECTED Final   Respiratory Syncytial Virus NOT DETECTED NOT DETECTED Final   Bordetella pertussis NOT DETECTED NOT DETECTED Final   Bordetella Parapertussis NOT DETECTED NOT DETECTED Final   Chlamydophila pneumoniae NOT DETECTED NOT DETECTED Final   Mycoplasma pneumoniae NOT DETECTED NOT DETECTED Final    Comment: Performed at [facility], [address], [place], [place] [address]    Procedures/Studies: DG Knee Complete 4 Views Left Result Date: 11/27/2023 CLINICAL DATA:  Pain after fall EXAM: LEFT KNEE - COMPLETE 4 VIEW COMPARISON:  None Available.  FINDINGS: No evidence of fracture, dislocation, or joint effusion. No evidence of arthropathy or  other focal bone abnormality. Soft tissues are unremarkable. Artifact from the patient's brace. Vascular calcifications. IMPRESSION: Grossly no acute osseous abnormality. Artifact from the overlapping brace. Electronically Signed   By: Ranell Bring M.D.   [name]: 11/27/2023 17:03   DG Hip Unilat W or Wo Pelvis 2-3 Views Left Result Date: 11/27/2023 CLINICAL DATA:  Pain after fall EXAM: DG HIP (WITH OR WITHOUT PELVIS) 3V LEFT COMPARISON:  None Available. FINDINGS: No fracture or dislocation. Preserved joint spaces. Only mild degenerative changes of the inferior aspect of the sacroiliac joints. Osteopenia. Presumed vascular calcifications in the pelvis. Mild degenerative changes of the visualized portions of the lumbar spine. With this level of osteopenia subtle nondisplaced injury is difficult to completely exclude and if needed additional cross-sectional imaging as clinically appropriate. Metallic focus overlying the perineum. Please correlate with history. IMPRESSION: Osteopenia. Mild degenerative changes. No acute osseous abnormality. Electronically Signed   By: Ranell Bring M.D.   [name]: 11/27/2023 17:01   DG Ribs Unilateral W/Chest Left Result Date: 11/27/2023 CLINICAL DATA:  Pain after fall EXAM: LEFT RIBS AND CHEST - 4 VIEW COMPARISON:  Chest x-ray 07/16/2023 FINDINGS: Left upper chest battery pack. Enlarged cardiopericardial silhouette. Calcified aorta. Slight prominence of the central vasculature. No pneumothorax or effusion. Subtle left lung base opacity. There is a question of a nondisplaced fracture of the lateral aspect of the left sixth rib. IMPRESSION: Question nondisplaced left 6 rib fracture. No pneumothorax or effusion. Enlarged heart with pacemaker. Subtle left lung base opacity. Recommend follow-up Electronically Signed   By: Ranell Bring M.D.   [name]: 11/27/2023 16:59   CT Cervical Spine Wo  Contrast Result Date: 11/27/2023 CLINICAL DATA:  Neck trauma (Age >= 65y) EXAM: CT CERVICAL SPINE WITHOUT CONTRAST TECHNIQUE: Multidetector CT imaging of the cervical spine was performed without intravenous contrast. Multiplanar CT image reconstructions were also generated. RADIATION DOSE REDUCTION: This exam was performed according to the departmental dose-optimization program which includes automated exposure control, adjustment of the mA and/or kV according to patient size and/or use of iterative reconstruction technique. COMPARISON:  07/15/2021 FINDINGS: Alignment: Facet joints are aligned without dislocation or traumatic listhesis. Dens and lateral masses are aligned. Straightening of the cervical lordosis. Skull base and vertebrae: No acute fracture. No primary bone lesion or focal pathologic process. Soft tissues and spinal canal: No prevertebral fluid or swelling. No visible canal hematoma. Disc levels:  Advanced multilevel cervical spondylosis. Upper chest: Included lung apices are clear. Other: Bilateral carotid atherosclerosis. IMPRESSION: No acute fracture or traumatic listhesis of the cervical spine. Electronically Signed   By: Mabel Converse D.O.   [name]: 11/27/2023 16:52   CT Head Wo Contrast Result Date: 11/27/2023 CLINICAL DATA:  Head trauma, minor (Age >= 65y) EXAM: CT HEAD WITHOUT CONTRAST TECHNIQUE: Contiguous axial images were obtained from the base of the skull through the vertex without intravenous contrast. RADIATION DOSE REDUCTION: This exam was performed according to the departmental dose-optimization program which includes automated exposure control, adjustment of the mA and/or kV according to patient size and/or use of iterative reconstruction technique. COMPARISON:  07/16/2023 FINDINGS: Brain: No evidence of acute infarction, hemorrhage, hydrocephalus, extra-axial collection or mass lesion/mass effect. Patchy low-density changes within the periventricular and subcortical white matter  most compatible with chronic microvascular ischemic change. Moderate diffuse cerebral volume loss. Vascular: Atherosclerotic calcifications involving the large vessels of the skull base. No unexpected hyperdense vessel. Skull: Normal. Negative for fracture or focal lesion. Sinuses/Orbits: No acute finding. Other: Negative for scalp hematoma. IMPRESSION: 1.  No acute intracranial findings. 2. 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 Electronically Signed   By: Mabel Converse D.O.   [name]: 11/27/2023 16:47    Time coordinating discharge: 45 mins  SIGNED:  Camellia Door, DO [facility] 12/02/23, 9:08 AM

## 2023-12-01 NOTE — TOC Progression Note (Signed)
Transition of Care [facility]) - Progression Note    Patient Details  Name: Brian [name] MRN: [id number] Date of Birth: 08-02-1941  Transition of Care [facility]) CM/SW Contact  Chapman Fitch, RN Phone Number: 12/01/2023, 3:42 PM  Clinical Narrative:      Bed offers presented to son [name] and daughter in law Davita.  They will review and call TOC back with decision  Update:  received return call from [employer].  They accept bed at [facility] Auth has been started in navi portal   Expected Discharge Plan: Skilled Nursing Facility Barriers to Discharge: Continued Medical Work up  Expected Discharge Plan and Services In-house Referral: Clinical Social Work   Post Acute Care Choice: Skilled Nursing Facility Living arrangements for the past 2 months: Single Family Home                                       Social Determinants of Health (SDOH) Interventions SDOH Screenings   Food Insecurity: Patient Unable To Answer (11/29/2023)  Housing: Patient Unable To Answer (11/29/2023)  Transportation Needs: Patient Unable To Answer (11/29/2023)  Utilities: Patient Unable To Answer (11/29/2023)  Depression (PHQ2-9): Low Risk  (06/25/2022)  Social Connections: Patient Unable To Answer (11/29/2023)  Tobacco Use: Medium Risk (11/27/2023)    Readmission Risk Interventions     No data to display

## 2023-12-01 NOTE — Assessment & Plan Note (Signed)
12-01-2023 stable. 12-02-2023 stable

## 2023-12-01 NOTE — Plan of Care (Signed)

## 2023-12-02 DIAGNOSIS — N390 Urinary tract infection, site not specified: Secondary | ICD-10-CM

## 2023-12-02 DIAGNOSIS — N179 Acute kidney failure, unspecified: Secondary | ICD-10-CM | POA: Diagnosis not present

## 2023-12-02 DIAGNOSIS — B9620 Unspecified Escherichia coli [E. coli] as the cause of diseases classified elsewhere: Secondary | ICD-10-CM

## 2023-12-02 DIAGNOSIS — I10 Essential (primary) hypertension: Secondary | ICD-10-CM | POA: Diagnosis not present

## 2023-12-02 DIAGNOSIS — G9341 Metabolic encephalopathy: Secondary | ICD-10-CM | POA: Diagnosis not present

## 2023-12-02 NOTE — Plan of Care (Signed)

## 2023-12-02 NOTE — TOC Transition Note (Signed)
 Transition of Care [facility]) - Discharge Note   Patient Details  Name: Brian [name] MRN: [id number] Date of Birth: [birth year]  [facility]) CM/SW Contact:  Corean ONEIDA Haddock, RN Phone Number: 12/02/2023, 10:09 AM   Clinical Narrative:    Insurance auth received for [facility]  [facility]  Anticipated DC date: 12/02/23  Family notified: Son Psychologist, Occupational by: ALBERTO  Per MD patient ready for DC to . RN, , patient's family, and facility notified of DC. Discharge Summary sent to facility. RN given number for report. DC packet on chart. Ambulance transport requested for patient.  TOC signing off.      Barriers to Discharge: Continued Medical Work up   Patient Goals and CMS Choice     Choice offered to / list presented to : Adult Children      Discharge Placement                       Discharge Plan and Services Additional resources added to the After Visit Summary for   In-house Referral: Clinical Social Work   Post Acute Care Choice: Skilled Nursing Facility                               Social Drivers of Health (SDOH) Interventions SDOH Screenings   Food Insecurity: Patient Unable To Answer (11/29/2023)  Housing: Patient Unable To Answer (11/29/2023)  Transportation Needs: Patient Unable To Answer (11/29/2023)  Utilities: Patient Unable To Answer (11/29/2023)  Depression (PHQ2-9): Low Risk  (06/25/2022)  Social Connections: Patient Unable To Answer (11/29/2023)  Tobacco Use: Medium Risk (11/27/2023)     Readmission Risk Interventions     No data to display

## 2023-12-02 NOTE — Progress Notes (Signed)
 PROGRESS NOTE    Brian [name]  FMW:[id number] DOB: 1940-12-14 DOA: 11/27/2023 PCP: [name], MD  Subjective: Pt seen and examined.  Insurance authorization obtained this AM Ready for DC Tolerating oral ABX.    Hospital Course: HPI: Brian [name] is a 83 y.o. male with medical history significant for HTN, HIV, prior CVA with right upper extremity weakness, COPD who presents to the ED with a 5-day history of weakness resulting in a fall, lethargy, sleeping a lot, decreased oral intake and confusion.  History is provided by son and daughter-in-law at bedside who stated that for the past 3 days he has been coughing and then on the day of arrival he developed diarrhea and shortness of breath.  They also noted him to be warm to the touch.   Significant Events: Admitted 11/27/2023 for UTI, acute metabolic encephalopathy   Significant Labs: WBC 11.1, HgB 17.3, Plt 282 Na 140, K 3.7, BUN 20, scr 1.29, glu 148 UA amber/turbid, + LE, +bacteria, neg Nitrite  Significant Imaging Studies: CT head shows No acute intracranial findings. 2. Chronic microvascular ischemic change and cerebral volume loss CT c-spine shows No acute fracture or traumatic listhesis of the cervical spine  Left Rib/CXR shows Question nondisplaced left 6 rib fracture. No pneumothorax or effusion. Enlarged heart with pacemaker. Subtle left lung base opacity. Left hip XR shows Osteopenia. Mild degenerative changes. No acute osseous abnormality.  Left knee XR shows Grossly no acute osseous abnormality. Artifact from the overlapping brace.  Antibiotic Therapy: Anti-infectives (From admission, onward)    Start     Dose/Rate Route Frequency Ordered Stop   11/28/23 2300  cefTRIAXone  (ROCEPHIN ) 1 g in sodium chloride  0.9 % 100 mL IVPB        1 g 200 mL/hr over 30 Minutes Intravenous Every 24 hours 11/28/23 0218     11/28/23 1000  abacavir -dolutegravir -lamiVUDine  (TRIUMEQ ) 600-50-300 MG per tablet 1 tablet        1  tablet Oral Daily 11/27/23 2238     11/28/23 1000  abacavir -dolutegravir -lamiVUDine  (TRIUMEQ ) 600-50-300 MG per tablet 1 tablet  Status:  Discontinued        1 tablet Oral Daily 11/28/23 0217 11/28/23 0221   11/27/23 2230  cefTRIAXone  (ROCEPHIN ) 1 g in sodium chloride  0.9 % 100 mL IVPB        1 g 200 mL/hr over 30 Minutes Intravenous  Once 11/27/23 2229 11/28/23 0018       Procedures:   Consultants:     Assessment and Plan: * E. coli UTI (urinary tract infection) 11-29-2023 urine cx growing GNR. Continue with IV Rocephin  11-30-2023 continue IV rocephin . Urine cx growing Klebsiella and E. Coli. Awaiting final sensitives  12-01-2023 klebsiella and e. Coli sensitive to rocephin  and cefazolin . Change to po keflex . Stable for DC to SNF. 12-02-2023 stable. Continue oral abx. DC to SNF.  Acute metabolic encephalopathy 11-29-2023 Secondary to acute infection. Neurologic checks with fall and aspiration precautions. Appears better. 11-30-2023 stable. Needs SNF placement. 12-01-2023 resolved.  Generalized weakness 11-29-2023 pt has been assessed by PT. Pt may need SNF. TOC looking into this. 11-30-2023 stable. Needs SNF placement. 12-01-2023 stable for DC to SNF. CM aware.  12-02-2023 going to SNF today.  Essential hypertension 11-29-2023 Continue Toprol -XL 50 mg qday. Prn clonidine . May need to add additional meds or increase dose of Toprol -XL 11-30-2023 SBP elevated. Will increased to Toprol -XL 75 mg qday.   12-01-2023 Toprol -XL just increased to 75 mg daily yesterday. Continue to monitor for effect.  No new changes to BP meds. Pt has PPM. Could increase dose of Toprol -XL if needed. His PPM is set for HR of 40.  12-02-2023 stable. Continue Toprol -XL 75 mg daily.   AKI (acute kidney injury) (HCC) 11-29-2023 resolved with IVF and treatment of UTI.  Encourage oral hydration. Monitor renal function and avoid nephrotoxins 12-01-2023 BUN 19, Scr 1.02. stable. Resolved AKI   Closed  fracture of one rib of left side 11-29-2023 stable 11-30-2023 stable. 12-01-2023 stable. 12-02-2023 stable  Cardiac pacemaker 12-01-2023 stable. 12-02-2023 stable  COPD (chronic obstructive pulmonary disease) (HCC) 11-29-2023 Not acutely exacerbated.  Albuterol  as needed 11-30-2023 stable. 12-01-2023 no wheezing. On RA. Stable.  12-02-2023 stable  HIV (human immunodeficiency virus infection) (HCC) 11-29-2023 Continue Triumeq  11-30-2023 stable. 12-01-2023 stable. 12-02-2023 continue HIV meds at SNF. F/u with ID clinic.       DVT prophylaxis: enoxaparin  (LOVENOX ) injection 40 mg Start: 11/28/23 0800    Code Status: Limited: Do not attempt resuscitation (DNR) -DNR-LIMITED -Do Not Intubate/DNI  Family Communication: no family at bedside Disposition Plan: SNF Reason for continuing need for hospitalization: stable for DC  Objective: Vitals:   12/01/23 2012 12/02/23 0346 12/02/23 0359 12/02/23 0830  BP: 136/79 (!) 166/93 (!) 162/92 (!) 157/99  Pulse: 71 66 63 67  Resp: 20 16  18   Temp: 97.9 F (36.6 C) 97.9 F (36.6 C)  98 F (36.7 C)  TempSrc: Oral Oral    SpO2: 92% 93%  95%  Weight:      Height:        Intake/Output Summary (Last 24 hours) at 12/02/2023 1006 Last data filed at 12/02/2023 0454 Gross per 24 hour  Intake --  Output 201 ml  Net -201 ml   Filed Weights   11/27/23 1529  Weight: 77.1 kg    Examination:  Physical Exam Vitals and nursing note reviewed.  Constitutional:      General: He is not in acute distress.    Appearance: He is not toxic-appearing or diaphoretic.  HENT:     Head: Normocephalic and atraumatic.  Eyes:     General: No scleral icterus. Cardiovascular:     Rate and Rhythm: Normal rate and regular rhythm.  Pulmonary:     Effort: Pulmonary effort is normal. No respiratory distress.     Breath sounds: Normal breath sounds. No wheezing or rales.  Abdominal:     General: Abdomen is flat. Bowel sounds are normal.      Palpations: Abdomen is soft.  Musculoskeletal:     Right lower leg: No edema.     Left lower leg: No edema.  Skin:    General: Skin is warm and dry.  Neurological:     Mental Status: He is alert.     Data Reviewed: I have personally reviewed following labs and imaging studies  CBC: Recent Labs  Lab 11/27/23 1530 11/28/23 0336 12/01/23 0504  WBC 11.1* 8.6 6.8  NEUTROABS  --   --  4.4  HGB 17.3* 15.0 14.9  HCT 48.4 43.4 42.2  MCV 88.0 91.2 89.2  PLT 282 206 236   Basic Metabolic Panel: Recent Labs  Lab 11/27/23 1530 11/28/23 0336 12/01/23 0504  NA 140 142 138  K 3.7 3.9 3.8  CL 105 109 105  CO2 22 22 23   GLUCOSE 148* 111* 97  BUN 20 24* 19  CREATININE 1.29* 1.01 1.02  CALCIUM  9.6 8.8* 8.8*   GFR: Estimated Creatinine Clearance: 54.6 mL/min (by C-G formula based on SCr of  1.02 mg/dL). Liver Function Tests: Recent Labs  Lab 11/27/23 1530 12/01/23 0504  AST 43* 30  ALT 27 28  ALKPHOS 51 45  BILITOT 1.1 1.1  PROT 8.4* 7.1  ALBUMIN 4.2 3.4*   BNP (last 3 results) Recent Labs    07/16/23 2250  BNP 186.4*   Recent Results (from the past 240 hours)  Urine Culture     Status: Abnormal   Collection Time: 11/27/23  3:30 PM   Specimen: Urine, Random  Result Value Ref Range Status   Specimen Description   Final    URINE, RANDOM Performed at [facility], [address], [place], [place] [address]    Special Requests   Final    Normal Performed at [facility], [address] [place], [place] [address]    Culture (A)  Final    >=100,000 COLONIES/mL ESCHERICHIA COLI >=100,000 COLONIES/mL KLEBSIELLA PNEUMONIAE    Report Status 11/30/2023 FINAL  Final   Organism ID, Bacteria ESCHERICHIA COLI (A)  Final   Organism ID, Bacteria KLEBSIELLA PNEUMONIAE (A)  Final      Susceptibility   Escherichia coli - MIC*    AMPICILLIN >=32 RESISTANT Resistant     CEFAZOLIN  16 SENSITIVE Sensitive     CEFEPIME 0.25 SENSITIVE Sensitive      CEFTRIAXONE  <=0.25 SENSITIVE Sensitive     CIPROFLOXACIN  >=4 RESISTANT Resistant     GENTAMICIN  >=16 RESISTANT Resistant     IMIPENEM <=0.25 SENSITIVE Sensitive     NITROFURANTOIN  <=16 SENSITIVE Sensitive     TRIMETH /SULFA  >=320 RESISTANT Resistant     AMPICILLIN/SULBACTAM >=32 RESISTANT Resistant     PIP/TAZO 8 SENSITIVE Sensitive ug/mL    * >=100,000 COLONIES/mL ESCHERICHIA COLI   Klebsiella pneumoniae - MIC*    AMPICILLIN RESISTANT Resistant     CEFAZOLIN  <=4 SENSITIVE Sensitive     CEFEPIME <=0.12 SENSITIVE Sensitive     CEFTRIAXONE  <=0.25 SENSITIVE Sensitive     CIPROFLOXACIN  <=0.25 SENSITIVE Sensitive     GENTAMICIN  <=1 SENSITIVE Sensitive     IMIPENEM <=0.25 SENSITIVE Sensitive     NITROFURANTOIN  128 RESISTANT Resistant     TRIMETH /SULFA  <=20 SENSITIVE Sensitive     AMPICILLIN/SULBACTAM 4 SENSITIVE Sensitive     PIP/TAZO <=4 SENSITIVE Sensitive ug/mL    * >=100,000 COLONIES/mL KLEBSIELLA PNEUMONIAE  Resp panel by RT-PCR (RSV, Flu A&B, Covid) Anterior Nasal Swab     Status: None   Collection Time: 11/27/23  9:54 PM   Specimen: Anterior Nasal Swab  Result Value Ref Range Status   SARS Coronavirus 2 by RT PCR NEGATIVE NEGATIVE Final    Comment: (NOTE) SARS-CoV-2 target nucleic acids are NOT DETECTED.  The SARS-CoV-2 RNA is generally detectable in upper respiratory specimens during the acute phase of infection. The lowest concentration of SARS-CoV-2 viral copies this assay can detect is 138 copies/mL. A negative result does not preclude SARS-Cov-2 infection and should not be used as the sole basis for treatment or other patient management decisions. A negative result may occur with  improper specimen collection/handling, submission of specimen other than nasopharyngeal swab, presence of viral mutation(s) within the areas targeted by this assay, and inadequate number of viral copies(<138 copies/mL). A negative result must be combined with clinical observations, patient  history, and epidemiological information. The expected result is Negative.  Fact Sheet for Patients:  bloggercourse.com  Fact Sheet for Healthcare Providers:  seriousbroker.it  This test is no t yet approved or cleared by the [place]  FDA and  has been  authorized for detection and/or diagnosis of SARS-CoV-2 by FDA under an Emergency Use Authorization (EUA). This EUA will remain  in effect (meaning this test can be used) for the duration of the COVID-19 declaration under Section 564(b)(1) of the Act, 21 U.S.C.section 360bbb-3(b)(1), unless the authorization is terminated  or revoked sooner.       Influenza A by PCR NEGATIVE NEGATIVE Final   Influenza B by PCR NEGATIVE NEGATIVE Final    Comment: (NOTE) The Xpert Xpress SARS-CoV-2/FLU/RSV plus assay is intended as an aid in the diagnosis of influenza from Nasopharyngeal swab specimens and should not be used as a sole basis for treatment. Nasal washings and aspirates are unacceptable for Xpert Xpress SARS-CoV-2/FLU/RSV testing.  Fact Sheet for Patients: bloggercourse.com  Fact Sheet for Healthcare Providers: seriousbroker.it  This test is not yet approved or cleared by the [place]  FDA and has been authorized for detection and/or diagnosis of SARS-CoV-2 by FDA under an Emergency Use Authorization (EUA). This EUA will remain in effect (meaning this test can be used) for the duration of the COVID-19 declaration under Section 564(b)(1) of the Act, 21 U.S.C. section 360bbb-3(b)(1), unless the authorization is terminated or revoked.     Resp Syncytial Virus by PCR NEGATIVE NEGATIVE Final    Comment: (NOTE) Fact Sheet for Patients: bloggercourse.com  Fact Sheet for Healthcare Providers: seriousbroker.it  This test is not yet approved or cleared by the [place]  FDA  and has been authorized for detection and/or diagnosis of SARS-CoV-2 by FDA under an Emergency Use Authorization (EUA). This EUA will remain in effect (meaning this test can be used) for the duration of the COVID-19 declaration under Section 564(b)(1) of the Act, 21 U.S.C. section 360bbb-3(b)(1), unless the authorization is terminated or revoked.  Performed at [facility], [address] [place], [place] [address]   Culture, blood (Routine X 2) w Reflex to ID Panel     Status: None (Preliminary result)   Collection Time: 11/28/23  2:52 PM   Specimen: BLOOD  Result Value Ref Range Status   Specimen Description BLOOD BLOOD LEFT ARM  Final   Special Requests   Final    BOTTLES DRAWN AEROBIC AND ANAEROBIC Blood Culture adequate volume   Culture   Final    NO GROWTH 4 DAYS Performed at [facility], [address], [place], [place] [address]    Report Status PENDING  Incomplete  Culture, blood (Routine X 2) w Reflex to ID Panel     Status: None (Preliminary result)   Collection Time: 11/28/23  2:52 PM   Specimen: BLOOD  Result Value Ref Range Status   Specimen Description BLOOD BLOOD RIGHT ARM  Final   Special Requests   Final    BOTTLES DRAWN AEROBIC AND ANAEROBIC Blood Culture adequate volume   Culture   Final    NO GROWTH 4 DAYS Performed at [facility], [address] [place], [place] [address]    Report Status PENDING  Incomplete  Respiratory (~20 pathogens) panel by PCR     Status: None   Collection Time: 11/28/23  2:52 PM   Specimen: Nasopharyngeal Swab; Respiratory  Result Value Ref Range Status   Adenovirus NOT DETECTED NOT DETECTED Final   Coronavirus 229E NOT DETECTED NOT DETECTED Final    Comment: (NOTE) The Coronavirus on the Respiratory Panel, DOES NOT test for the novel  Coronavirus (2019 nCoV)    Coronavirus HKU1 NOT DETECTED NOT DETECTED Final   Coronavirus NL63 NOT DETECTED  NOT DETECTED Final   Coronavirus OC43 NOT DETECTED  NOT DETECTED Final   Metapneumovirus NOT DETECTED NOT DETECTED Final   Rhinovirus / Enterovirus NOT DETECTED NOT DETECTED Final   Influenza A NOT DETECTED NOT DETECTED Final   Influenza B NOT DETECTED NOT DETECTED Final   Parainfluenza Virus 1 NOT DETECTED NOT DETECTED Final   Parainfluenza Virus 2 NOT DETECTED NOT DETECTED Final   Parainfluenza Virus 3 NOT DETECTED NOT DETECTED Final   Parainfluenza Virus 4 NOT DETECTED NOT DETECTED Final   Respiratory Syncytial Virus NOT DETECTED NOT DETECTED Final   Bordetella pertussis NOT DETECTED NOT DETECTED Final   Bordetella Parapertussis NOT DETECTED NOT DETECTED Final   Chlamydophila pneumoniae NOT DETECTED NOT DETECTED Final   Mycoplasma pneumoniae NOT DETECTED NOT DETECTED Final    Comment: Performed at [facility], [address] [place], [place] [address]   Scheduled Meds:  abacavir -dolutegravir -lamiVUDine   1 tablet Oral Daily   cephALEXin   500 mg Oral Q8H   clopidogrel   75 mg Oral Daily   DULoxetine   30 mg Oral Daily   dutasteride   0.5 mg Oral Daily   enoxaparin  (LOVENOX ) injection  40 mg Subcutaneous Q24H   ezetimibe   10 mg Oral Daily   ketotifen   1 drop Both Eyes BID   metoprolol  succinate  75 mg Oral Daily   pantoprazole   40 mg Oral Daily   tamsulosin   0.4 mg Oral Daily   Vitamin D  (Ergocalciferol )  50,000 Units Oral Weekly   Continuous Infusions:   LOS: 5 days   Time spent: 45 minutes  Camellia Door, DO  [facility]  12/02/2023, 10:06 AM

## 2023-12-03 LAB — CULTURE, BLOOD (ROUTINE X 2)
Culture: NO GROWTH
Culture: NO GROWTH
Special Requests: ADEQUATE
Special Requests: ADEQUATE

## 2023-12-08 ENCOUNTER — Other Ambulatory Visit (HOSPITAL_COMMUNITY): Payer: Self-pay

## 2023-12-11 ENCOUNTER — Other Ambulatory Visit: Payer: Self-pay | Admitting: Pharmacy Technician

## 2023-12-11 ENCOUNTER — Other Ambulatory Visit: Payer: Self-pay

## 2023-12-11 NOTE — Progress Notes (Signed)
Specialty Pharmacy Refill Coordination Note  Brian [name] is a 83 y.o. male contacted today regarding refills of specialty medication(s) Abacavir-Dolutegravir-Lamivud [name])   Patient requested Delivery   Delivery date: 12/15/23   Verified address: [address]  [place]   Medication will be filled on 12/12/23.

## 2023-12-12 ENCOUNTER — Other Ambulatory Visit: Payer: Self-pay

## 2024-01-12 ENCOUNTER — Other Ambulatory Visit: Payer: Self-pay

## 2024-01-12 ENCOUNTER — Other Ambulatory Visit: Payer: Self-pay | Admitting: Infectious Diseases

## 2024-01-12 ENCOUNTER — Other Ambulatory Visit (HOSPITAL_COMMUNITY): Payer: Self-pay

## 2024-01-12 DIAGNOSIS — B20 Human immunodeficiency virus [HIV] disease: Secondary | ICD-10-CM

## 2024-01-12 MED ORDER — TRIUMEQ 600-50-300 MG PO TABS
1.0000 | ORAL_TABLET | Freq: Every day | ORAL | 5 refills | Status: DC
  Filled 2024-01-12: qty 30, 30d supply, fill #0
  Filled 2024-02-13: qty 30, 30d supply, fill #1
  Filled 2024-03-08: qty 30, 30d supply, fill #2
  Filled 2024-04-07: qty 30, 30d supply, fill #3
  Filled 2024-05-03: qty 30, 30d supply, fill #4
  Filled 2024-06-03: qty 30, 30d supply, fill #5

## 2024-01-12 NOTE — Progress Notes (Signed)
 Specialty Pharmacy Refill Coordination Note  Brian [name] is a 83 y.o. male contacted today regarding refills of specialty medication(s) Abacavir-Dolutegravir-Lamivud [name])   Patient requested Delivery   Delivery date: 01/13/24   Verified address: [address] [place],  [place] [address]   Medication will be filled on 01/12/24, pending refill approval.

## 2024-02-09 ENCOUNTER — Other Ambulatory Visit: Payer: Self-pay

## 2024-02-13 ENCOUNTER — Other Ambulatory Visit: Payer: Self-pay

## 2024-02-13 NOTE — Progress Notes (Signed)
 Specialty Pharmacy Refill Coordination Note  Brian [name] is a 83 y.o. male contacted today regarding refills of specialty medication(s) Abacavir -Dolutegravir -Lamivud (Triumeq )   Patient requested Delivery   Delivery date: 02/16/24   Verified address: [address] [place],  [place] [address]   Medication will be filled on 02/13/24.

## 2024-03-05 ENCOUNTER — Other Ambulatory Visit: Payer: Self-pay

## 2024-03-08 ENCOUNTER — Other Ambulatory Visit: Payer: Self-pay

## 2024-03-08 NOTE — Progress Notes (Signed)
 Specialty Pharmacy Refill Coordination Note  Brian [name] is a 83 y.o. male contacted today regarding refills of specialty medication(s) Abacavir -Dolutegravir -Lamivud (Triumeq )   Patient requested Delivery   Delivery date: 03/10/24   Verified address: [address] [place],  [place] [address]   Medication will be filled on 03/09/24.

## 2024-03-08 NOTE — Progress Notes (Signed)
 Specialty Pharmacy Ongoing Clinical Assessment Note  Brian [name] is a 83 y.o. male who is being followed by the specialty pharmacy service for RxSp HIV   Patient's specialty medication(s) reviewed today: Abacavir -Dolutegravir -Lamivud (Triumeq )   Missed doses in the last 4 weeks: 0   Patient/Caregiver did not have any additional questions or concerns.   Therapeutic benefit summary: Unable to assess   Adverse events/side effects summary: No adverse events/side effects   Patient's therapy is appropriate to: Continue    Goals Addressed             This Visit's Progress    Achieve Undetectable HIV Viral Load < 20       Patient is unable to be assessed as no VL in chart since 2023. Patient will maintain adherence.          Follow up: 6 months  Alexxus Sobh M Arlind Klingerman Specialty Pharmacist

## 2024-03-26 ENCOUNTER — Other Ambulatory Visit: Payer: Self-pay

## 2024-03-31 ENCOUNTER — Other Ambulatory Visit: Payer: Self-pay

## 2024-04-05 ENCOUNTER — Other Ambulatory Visit: Payer: Self-pay

## 2024-04-07 ENCOUNTER — Other Ambulatory Visit: Payer: Self-pay

## 2024-04-07 NOTE — Progress Notes (Signed)
 Specialty Pharmacy Refill Coordination Note  Brian [name] is a 83 y.o. male contacted today regarding refills of specialty medication(s) Abacavir -Dolutegravir -Lamivud (Triumeq )   Patient requested Delivery   Delivery date: 04/09/24   Verified address: [address] [place], [place] [address]   Medication will be filled on 04/08/24.

## 2024-04-08 ENCOUNTER — Other Ambulatory Visit: Payer: Self-pay

## 2024-04-09 ENCOUNTER — Other Ambulatory Visit: Payer: Self-pay

## 2024-05-03 ENCOUNTER — Encounter (INDEPENDENT_AMBULATORY_CARE_PROVIDER_SITE_OTHER): Payer: Self-pay

## 2024-05-03 ENCOUNTER — Other Ambulatory Visit: Payer: Self-pay

## 2024-05-03 ENCOUNTER — Other Ambulatory Visit (HOSPITAL_COMMUNITY): Payer: Self-pay

## 2024-05-03 ENCOUNTER — Other Ambulatory Visit: Payer: Self-pay | Admitting: Pharmacy Technician

## 2024-05-03 NOTE — Progress Notes (Signed)
 Specialty Pharmacy Refill Coordination Note  Brian [name] is a 83 y.o. male contacted today regarding refills of specialty medication(s) Abacavir -Dolutegravir -Lamivud (Triumeq )   Patient requested (Patient-Rptd) Delivery   Delivery date: 05/06/2024 Verified address: (Patient-Rptd) [address] [place], [place] [address]   Medication will be filled on 05/05/2024.

## 2024-05-04 ENCOUNTER — Other Ambulatory Visit: Payer: Self-pay

## 2024-05-05 ENCOUNTER — Other Ambulatory Visit: Payer: Self-pay

## 2024-05-06 ENCOUNTER — Ambulatory Visit: Admission: EM | Admit: 2024-05-06 | Discharge: 2024-05-06 | Disposition: A | Discharge status: Home / Self Care

## 2024-05-06 ENCOUNTER — Ambulatory Visit (INDEPENDENT_AMBULATORY_CARE_PROVIDER_SITE_OTHER)

## 2024-05-06 DIAGNOSIS — M7918 Myalgia, other site: Secondary | ICD-10-CM | POA: Diagnosis not present

## 2024-05-06 DIAGNOSIS — S2232XA Fracture of one rib, left side, initial encounter for closed fracture: Secondary | ICD-10-CM

## 2024-05-06 MED ORDER — LIDOCAINE 5 % EX PTCH: 1.0000 | MEDICATED_PATCH | CUTANEOUS | 0 refills | Status: DC

## 2024-05-06 MED ORDER — MELOXICAM 7.5 MG PO TABS: 7.5000 mg | ORAL_TABLET | Freq: Every day | ORAL | 0 refills | Status: DC

## 2024-05-06 NOTE — Discharge Instructions (Addendum)
  1. Musculoskeletal pain (Primary) - DG Shoulder Left x-ray performed in UC shows no acute fracture of the left shoulder - DG Hand Complete Left x-ray performed in UC shows no acute fracture to the left hand - DG Elbow Complete Left x-ray performed in UC shows no acute fracture to the left elbow  2. Closed fracture of one rib of left side, initial encounter - DG Ribs Unilateral W/Chest Left x-ray performed in UC shows nondisplaced left seventh rib fracture. - meloxicam  (MOBIC ) 7.5 MG tablet; Take 1 tablet (7.5 mg total) by mouth daily.  Dispense: 30 tablet; Refill: 0 - lidocaine  (LIDODERM ) 5 %; Place 1 patch onto the skin daily. Remove & Discard patch within 12 hours or as directed by MD  Dispense: 30 patch; Refill: 0 - Follow-up with primary care provider for further evaluation and repeat rib x-ray to evaluate appropriate healing. -Continue to monitor symptoms for any change in severity if there is any escalation of current symptoms or development of new symptoms follow-up in ER for further evaluation and management.

## 2024-05-06 NOTE — ED Triage Notes (Signed)
 Pt is with his daughter in law  Pt c/o Fall on 05/02/24  Pt states that he has a lot of pain on his left side after the fall that has been getting worse  Pt had a broken rib on the left side in February  Pt has bruising along his side and pain in his left hand  Pt has not been eating since the fall.  Pt states that the pain is constant and worsens when he moves and coughs.

## 2024-05-06 NOTE — ED Provider Notes (Signed)
 [facility]  Note:  This document was prepared using Conservation officer, historic buildings and may include unintentional dictation errors.  MRN: [id number] DOB: 08-08-1941  Subjective:   Brian [name] is a 83 y.o. male presenting for For evaluation of injury sustained during a fall that occurred 3 to 4 days ago.  Patient's daughter-in-law states that she was not there to witness the fall when it happened, but states that patient is still complaining of left-sided rib pain, left hand pain, left elbow pain, left shoulder pain.  Patient has bruising along his arm and on his ribs.  Pain is much worse when coughing or moving which prompted visit to urgent care for evaluation.  Patient has not been given any over-the-counter pain medication to treat symptoms.  Patient does have past history of broken ribs on the left side from back in February.  No current facility-administered medications for this encounter.  Current Outpatient Medications:    abacavir -dolutegravir -lamiVUDine  (TRIUMEQ ) 600-50-300 MG tablet, Take 1 tablet by mouth daily., Disp: 30 tablet, Rfl: 5   alendronate  (FOSAMAX ) 70 MG tablet, Take 70 mg by mouth once a week. Take on Saturday., Disp: , Rfl: 5   azelastine  (OPTIVAR ) 0.05 % ophthalmic solution, Apply 1 drop to eye 2 (two) times daily., Disp: , Rfl:    cloNIDine  (CATAPRES ) 0.2 MG tablet, Take 1 tablet (0.2 mg total) by mouth every 4 (four) hours as needed (SBP >170 or DBP >100)., Disp: , Rfl:    clopidogrel  (PLAVIX ) 75 MG tablet, Take 75 mg by mouth daily., Disp: , Rfl:    DULoxetine  (CYMBALTA ) 30 MG capsule, Take 30 mg by mouth daily., Disp: , Rfl:    dutasteride  (AVODART ) 0.5 MG capsule, Take 1 capsule (0.5 mg total) by mouth daily., Disp: 30 capsule, Rfl: 11   ezetimibe  (ZETIA ) 10 MG tablet, Take 10 mg by mouth daily., Disp: , Rfl:    lidocaine  (LIDODERM ) 5 %, Place 1 patch onto the skin daily. Remove & Discard patch within 12 hours or as directed by MD, Disp: 30 patch,  Rfl: 0   loratadine  (CLARITIN ) 10 MG tablet, Take 10 mg by mouth daily., Disp: , Rfl:    meloxicam  (MOBIC ) 7.5 MG tablet, Take 1 tablet (7.5 mg total) by mouth daily., Disp: 30 tablet, Rfl: 0   simethicone  (MYLICON) 80 MG chewable tablet, Chew 1 tablet (80 mg total) by mouth every 6 (six) hours as needed for flatulence., Disp: 30 tablet, Rfl: 0   tamsulosin  (FLOMAX ) 0.4 MG CAPS capsule, Take 1 capsule (0.4 mg total) by mouth daily., Disp: 30 capsule, Rfl: 11   VENTOLIN  HFA 108 (90 BASE) MCG/ACT inhaler, Inhale 2 puffs into the lungs 4 (four) times daily as needed. For shortness of breath and/or wheezing., Disp: , Rfl: 5   Vitamin D , Ergocalciferol , (DRISDOL ) 1.25 MG (50000 UNIT) CAPS capsule, Take 50,000 Units by mouth once a week., Disp: , Rfl:    metoprolol  succinate (TOPROL -XL) 25 MG 24 hr tablet, Take 3 tablets (75 mg total) by mouth daily., Disp: , Rfl:    pantoprazole  (PROTONIX ) 40 MG tablet, Take 1 tablet (40 mg total) by mouth daily., Disp: , Rfl:    No Known Allergies  Past Medical History:  Diagnosis Date   Arthritis    Asthma    Bradycardia    Depression    Enlarged prostate    HIV (human immunodeficiency virus infection) (HCC)    Hyperlipidemia    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   Stroke (HCC)  Past Surgical History:  Procedure Laterality Date   cataracts     EYE SURGERY     PACEMAKER INSERTION N/A 08/02/2015   Procedure: INSERTION PACEMAKER;  Surgeon: Sheralyn Flock, MD;  Location: [name];  Service: Cardiovascular;  Laterality: N/A;    Family History  Problem Relation Age of Onset   Heart disease Brother    Hypertension Other     Social History   Tobacco Use   Smoking status: Former    Types: Cigarettes   Smokeless tobacco: Never  Vaping Use   Vaping status: Never Used  Substance Use Topics   Alcohol  use: Yes    Alcohol /week: 4.0 standard drinks of alcohol     Types: 4 Cans of beer per week   Drug use: No    ROS Refer to HPI for ROS  details.  Objective:   Vitals: BP 108/85 (BP Location: Left Arm)   Pulse 82   Temp (!) 97.4 F (36.3 C) (Oral)   Ht 5' 4 (1.626 m)   Wt 125 lb (56.7 kg)   SpO2 97%   BMI 21.46 kg/m   Physical Exam Vitals and nursing note reviewed.  Constitutional:      General: He is not in acute distress.    Appearance: Normal appearance. He is not ill-appearing or toxic-appearing.  HENT:     Head: Normocephalic.  Cardiovascular:     Rate and Rhythm: Normal rate.  Pulmonary:     Effort: Pulmonary effort is normal. No respiratory distress.  Chest:     Chest wall: Tenderness present.  Musculoskeletal:        General: Tenderness present.     Left shoulder: Tenderness and bony tenderness present. No swelling. Normal range of motion. Normal strength. Normal pulse.     Left elbow: Swelling present. No deformity. Normal range of motion. Tenderness present.     Left hand: Swelling, tenderness and bony tenderness present. No deformity. Decreased range of motion. Decreased strength. Normal capillary refill. Normal pulse.  Skin:    General: Skin is warm and dry.     Capillary Refill: Capillary refill takes less than 2 seconds.  Neurological:     General: No focal deficit present.     Mental Status: He is alert and oriented to person, place, and time.  Psychiatric:        Mood and Affect: Mood normal.        Behavior: Behavior normal.     Procedures  No results found for this or any previous visit (from the past 24 hours).  Assessment and Plan :     Discharge Instructions       1. Musculoskeletal pain (Primary) - DG Shoulder Left x-ray performed in UC shows no acute fracture of the left shoulder - DG Hand Complete Left x-ray performed in UC shows no acute fracture to the left hand - DG Elbow Complete Left x-ray performed in UC shows no acute fracture to the left elbow  2. Closed fracture of one rib of left side, initial encounter - DG Ribs Unilateral W/Chest Left x-ray performed  in UC shows nondisplaced left seventh rib fracture. - meloxicam  (MOBIC ) 7.5 MG tablet; Take 1 tablet (7.5 mg total) by mouth daily.  Dispense: 30 tablet; Refill: 0 - lidocaine  (LIDODERM ) 5 %; Place 1 patch onto the skin daily. Remove & Discard patch within 12 hours or as directed by MD  Dispense: 30 patch; Refill: 0 - Follow-up with primary care provider for further evaluation and repeat rib  x-ray to evaluate appropriate healing. -Continue to monitor symptoms for any change in severity if there is any escalation of current symptoms or development of new symptoms follow-up in ER for further evaluation and management.      Garnet Chatmon B Markan Cazarez   Rollo Farquhar, [place] B, [place] 05/06/24 1928

## 2024-06-01 ENCOUNTER — Other Ambulatory Visit: Payer: Self-pay

## 2024-06-03 ENCOUNTER — Other Ambulatory Visit: Payer: Self-pay

## 2024-06-03 NOTE — Progress Notes (Signed)
 Specialty Pharmacy Refill Coordination Note  Brian [name] is a 83 y.o. male contacted today regarding refills of specialty medication(s) Abacavir -Dolutegravir -Lamivud (Triumeq )   Patient requested Delivery   Delivery date: 06/07/24   Verified address: [address] [place] [address]   Medication will be filled on 06/04/24.

## 2024-06-25 ENCOUNTER — Other Ambulatory Visit: Payer: Self-pay

## 2024-06-25 ENCOUNTER — Other Ambulatory Visit: Payer: Self-pay | Admitting: Infectious Diseases

## 2024-06-25 DIAGNOSIS — B20 Human immunodeficiency virus [HIV] disease: Secondary | ICD-10-CM

## 2024-06-25 MED ORDER — TRIUMEQ 600-50-300 MG PO TABS
1.0000 | ORAL_TABLET | Freq: Every day | ORAL | 5 refills | Status: AC
  Filled 2024-06-29 – 2024-07-09 (×3): qty 30, 30d supply, fill #0
  Filled 2024-08-06: qty 30, 30d supply, fill #1
  Filled 2024-08-31: qty 30, 30d supply, fill #2
  Filled 2024-09-30: qty 30, 30d supply, fill #3
  Filled 2024-10-25: qty 30, 30d supply, fill #4
  Filled 2024-11-29: qty 30, 30d supply, fill #5

## 2024-06-29 ENCOUNTER — Other Ambulatory Visit: Payer: Self-pay

## 2024-07-01 ENCOUNTER — Other Ambulatory Visit: Payer: Self-pay

## 2024-07-09 ENCOUNTER — Other Ambulatory Visit: Payer: Self-pay

## 2024-07-09 ENCOUNTER — Other Ambulatory Visit: Payer: Self-pay | Admitting: Pharmacy Technician

## 2024-07-09 ENCOUNTER — Other Ambulatory Visit (HOSPITAL_COMMUNITY): Payer: Self-pay

## 2024-07-09 NOTE — Progress Notes (Signed)
 Specialty Pharmacy Refill Coordination Note  Brian [name] is a 83 y.o. male contacted today regarding refills of specialty medication(s) Abacavir -Dolutegravir -Lamivud (Triumeq )   Patient requested Delivery   Delivery date: 07/13/24   Verified address: [address]   [place] [id number]   Medication will be filled on 07/12/24.  Spoke to patient's son.

## 2024-07-13 ENCOUNTER — Other Ambulatory Visit (HOSPITAL_COMMUNITY): Payer: Self-pay

## 2024-07-21 ENCOUNTER — Emergency Department

## 2024-07-21 ENCOUNTER — Other Ambulatory Visit: Payer: Self-pay

## 2024-07-21 ENCOUNTER — Emergency Department
Admission: EM | Admit: 2024-07-21 | Discharge: 2024-07-21 | Disposition: A | Discharge status: Home / Self Care | Attending: Emergency Medicine | Admitting: Emergency Medicine

## 2024-07-21 DIAGNOSIS — Z21 Asymptomatic human immunodeficiency virus [HIV] infection status: Secondary | ICD-10-CM | POA: Diagnosis not present

## 2024-07-21 DIAGNOSIS — S8012XA Contusion of left lower leg, initial encounter: Secondary | ICD-10-CM | POA: Diagnosis not present

## 2024-07-21 DIAGNOSIS — W19XXXA Unspecified fall, initial encounter: Secondary | ICD-10-CM

## 2024-07-21 DIAGNOSIS — Z8673 Personal history of transient ischemic attack (TIA), and cerebral infarction without residual deficits: Secondary | ICD-10-CM | POA: Insufficient documentation

## 2024-07-21 DIAGNOSIS — S8992XA Unspecified injury of left lower leg, initial encounter: Secondary | ICD-10-CM | POA: Diagnosis present

## 2024-07-21 DIAGNOSIS — W1830XA Fall on same level, unspecified, initial encounter: Secondary | ICD-10-CM | POA: Diagnosis not present

## 2024-07-21 NOTE — ED Notes (Signed)
 Pt denies LOC

## 2024-07-21 NOTE — ED Triage Notes (Signed)
 Pt to ED for unwitnessed fall, found on floor by RN this morning. Pt is from [facility]. Pt has known UTI, started abx yesterday.   EMS VS: 170/100, other VS normal.  Pt complains of L knee pain. Pt states he was walking towards the bed and tripped over something and fell around 2am. Was using walker and it tipped over.   Pt is Spanish speaking. Alert and oriented.

## 2024-07-21 NOTE — ED Provider Notes (Signed)
 [facility] Provider Note    Event Date/Time   First MD Initiated Contact with Patient 07/21/24 1117     (approximate)   History   Fall   HPI  Brian [name] is a 83 y.o. male with history of HIV, CVA who presents after mechanical fall, he complains of pain to his left knee and left shin primarily but also some pain to his left hip.  No head injury reported.  No chest pain no abdominal pain no back pain     Physical Exam   Triage Vital Signs: ED Triage Vitals  Encounter Vitals Group     BP 07/21/24 1125 (!) 144/104     Girls Systolic BP Percentile --      Girls Diastolic BP Percentile --      Boys Systolic BP Percentile --      Boys Diastolic BP Percentile --      Pulse Rate 07/21/24 1125 100     Resp 07/21/24 1125 20     Temp 07/21/24 1125 (!) 97.1 F (36.2 C)     Temp Source 07/21/24 1125 Axillary     SpO2 07/21/24 1125 100 %     Weight 07/21/24 1122 61.3 kg (135 lb 2.3 oz)     Height 07/21/24 1122 1.702 m (5' 7)     Head Circumference --      Peak Flow --      Pain Score 07/21/24 1121 8     Pain Loc --      Pain Education --      Exclude from Growth Chart --     Most recent vital signs: Vitals:   07/21/24 1125  BP: (!) 144/104  Pulse: 100  Resp: 20  Temp: (!) 97.1 F (36.2 C)  SpO2: 100%     General: Awake, no distress.  CV:  Good peripheral perfusion.  Resp:  Normal effort.  Abd:  No distention.  Other:  Bruising and swelling to the anterior left tibia, good flexion at the knee, no pain with axial load on the left hip   ED Results / Procedures / Treatments   Labs (all labs ordered are listed, but only abnormal results are displayed) Labs Reviewed - No data to display   EKG  ED ECG REPORT I, Lamar Price, the attending physician, personally viewed and interpreted this ECG.  Date: 07/21/2024  Rhythm: normal sinus rhythm QRS Axis: normal Intervals: normal ST/T Wave abnormalities: normal Narrative  Interpretation: no evidence of acute ischemia    RADIOLOGY Hip x-ray viewed interpret by me, no fracture    PROCEDURES:  Critical Care performed:   Procedures   MEDICATIONS ORDERED IN ED: Medications - No data to display   IMPRESSION / MDM / ASSESSMENT AND PLAN / ED COURSE  I reviewed the triage vital signs and the nursing notes. Patient's presentation is most consistent with acute complicated illness / injury requiring diagnostic workup.  Patient presents for mechanical fall as described above, injuries limited to the left leg primarily the left shin, x-rays pending  X-rays are negative for acute injury, appropriate for discharge, no indication for admission at this time        FINAL CLINICAL IMPRESSION(S) / ED DIAGNOSES   Final diagnoses:  Fall, initial encounter  Contusion of left lower leg, initial encounter     Rx / DC Orders   ED Discharge Orders     None        Note:  This document  was prepared using Conservation officer, historic buildings and may include unintentional dictation errors.   Brian Charleston, MD 07/21/24 1321

## 2024-08-06 ENCOUNTER — Other Ambulatory Visit: Payer: Self-pay

## 2024-08-06 NOTE — Progress Notes (Signed)
 Specialty Pharmacy Refill Coordination Note  Brian [name] is a 83 y.o. male, patients son was contacted today regarding refills of specialty medication(s) Abacavir -Dolutegravir -Lamivud (Triumeq )   Patient requested Delivery   Delivery date: 08/09/24   Verified address: [address]   [place] [id number]   Medication will be filled on 08/06/24.

## 2024-08-30 ENCOUNTER — Other Ambulatory Visit: Payer: Self-pay

## 2024-08-31 ENCOUNTER — Other Ambulatory Visit (HOSPITAL_COMMUNITY): Payer: Self-pay

## 2024-09-02 ENCOUNTER — Other Ambulatory Visit (HOSPITAL_COMMUNITY): Payer: Self-pay

## 2024-09-06 ENCOUNTER — Other Ambulatory Visit: Payer: Self-pay

## 2024-09-06 NOTE — Progress Notes (Signed)
 Specialty Pharmacy Refill Coordination Note  Brian [name] is a 84 y.o. male contacted today regarding refills of specialty medication(s) Abacavir -Dolutegravir -Lamivud (Triumeq )   Patient requested Delivery   Delivery date: 09/08/24   Verified address: [address]   [place] [id number]   Medication will be filled on: 09/07/24

## 2024-09-21 ENCOUNTER — Inpatient Hospital Stay

## 2024-09-21 ENCOUNTER — Inpatient Hospital Stay
Admission: EM | Admit: 2024-09-21 | Discharge: 2024-09-27 | DRG: 689 | Disposition: A | Discharge status: Skilled Nursing Facility | Source: Skilled Nursing Facility | Attending: Internal Medicine | Admitting: Internal Medicine

## 2024-09-21 ENCOUNTER — Emergency Department

## 2024-09-21 ENCOUNTER — Other Ambulatory Visit: Payer: Self-pay

## 2024-09-21 DIAGNOSIS — T796XXA Traumatic ischemia of muscle, initial encounter: Secondary | ICD-10-CM | POA: Diagnosis present

## 2024-09-21 DIAGNOSIS — J4489 Other specified chronic obstructive pulmonary disease: Secondary | ICD-10-CM | POA: Diagnosis present

## 2024-09-21 DIAGNOSIS — G9341 Metabolic encephalopathy: Secondary | ICD-10-CM | POA: Diagnosis not present

## 2024-09-21 DIAGNOSIS — Z7983 Long term (current) use of bisphosphonates: Secondary | ICD-10-CM

## 2024-09-21 DIAGNOSIS — I10 Essential (primary) hypertension: Secondary | ICD-10-CM | POA: Diagnosis present

## 2024-09-21 DIAGNOSIS — R32 Unspecified urinary incontinence: Secondary | ICD-10-CM | POA: Diagnosis present

## 2024-09-21 DIAGNOSIS — W010XXA Fall on same level from slipping, tripping and stumbling without subsequent striking against object, initial encounter: Secondary | ICD-10-CM | POA: Diagnosis present

## 2024-09-21 DIAGNOSIS — Z993 Dependence on wheelchair: Secondary | ICD-10-CM

## 2024-09-21 DIAGNOSIS — N390 Urinary tract infection, site not specified: Principal | ICD-10-CM | POA: Diagnosis present

## 2024-09-21 DIAGNOSIS — Z7902 Long term (current) use of antithrombotics/antiplatelets: Secondary | ICD-10-CM

## 2024-09-21 DIAGNOSIS — Z8673 Personal history of transient ischemic attack (TIA), and cerebral infarction without residual deficits: Secondary | ICD-10-CM

## 2024-09-21 DIAGNOSIS — Z21 Asymptomatic human immunodeficiency virus [HIV] infection status: Secondary | ICD-10-CM | POA: Diagnosis present

## 2024-09-21 DIAGNOSIS — E876 Hypokalemia: Secondary | ICD-10-CM | POA: Diagnosis present

## 2024-09-21 DIAGNOSIS — I639 Cerebral infarction, unspecified: Secondary | ICD-10-CM | POA: Diagnosis present

## 2024-09-21 DIAGNOSIS — M6282 Rhabdomyolysis: Secondary | ICD-10-CM | POA: Diagnosis present

## 2024-09-21 DIAGNOSIS — Z87891 Personal history of nicotine dependence: Secondary | ICD-10-CM

## 2024-09-21 DIAGNOSIS — Z950 Presence of cardiac pacemaker: Secondary | ICD-10-CM

## 2024-09-21 DIAGNOSIS — E785 Hyperlipidemia, unspecified: Secondary | ICD-10-CM | POA: Diagnosis present

## 2024-09-21 DIAGNOSIS — R531 Weakness: Secondary | ICD-10-CM | POA: Diagnosis present

## 2024-09-21 DIAGNOSIS — I69331 Monoplegia of upper limb following cerebral infarction affecting right dominant side: Secondary | ICD-10-CM

## 2024-09-21 DIAGNOSIS — Z66 Do not resuscitate: Secondary | ICD-10-CM | POA: Diagnosis present

## 2024-09-21 DIAGNOSIS — Z79899 Other long term (current) drug therapy: Secondary | ICD-10-CM

## 2024-09-21 DIAGNOSIS — M25552 Pain in left hip: Secondary | ICD-10-CM | POA: Diagnosis present

## 2024-09-21 DIAGNOSIS — Z8249 Family history of ischemic heart disease and other diseases of the circulatory system: Secondary | ICD-10-CM

## 2024-09-21 DIAGNOSIS — Y92009 Unspecified place in unspecified non-institutional (private) residence as the place of occurrence of the external cause: Secondary | ICD-10-CM

## 2024-09-21 DIAGNOSIS — W19XXXA Unspecified fall, initial encounter: Principal | ICD-10-CM | POA: Insufficient documentation

## 2024-09-21 LAB — CBC
HCT: 50.3 % (ref 39.0–52.0)
Hemoglobin: 17 g/dL (ref 13.0–17.0)
MCH: 31.7 pg (ref 26.0–34.0)
MCHC: 33.8 g/dL (ref 30.0–36.0)
MCV: 93.8 fL (ref 80.0–100.0)
Platelets: 282 K/uL (ref 150–400)
RBC: 5.36 MIL/uL (ref 4.22–5.81)
RDW: 13.2 % (ref 11.5–15.5)
WBC: 16.6 K/uL — ABNORMAL HIGH (ref 4.0–10.5)
nRBC: 0 % (ref 0.0–0.2)

## 2024-09-21 LAB — URINALYSIS, W/ REFLEX TO CULTURE (INFECTION SUSPECTED)
Bilirubin Urine: NEGATIVE
Glucose, UA: NEGATIVE mg/dL
Ketones, ur: 5 mg/dL — AB
Nitrite: POSITIVE — AB
Protein, ur: 30 mg/dL — AB
Specific Gravity, Urine: 1.014 (ref 1.005–1.030)
WBC, UA: >50 WBC/hpf (ref 0–5)
pH: 5 (ref 5.0–8.0)

## 2024-09-21 LAB — BASIC METABOLIC PANEL WITH GFR
Anion gap: 17 — ABNORMAL HIGH (ref 5–15)
BUN: 29 mg/dL — ABNORMAL HIGH (ref 8–23)
CO2: 21 mmol/L — ABNORMAL LOW (ref 22–32)
Calcium: 9.9 mg/dL (ref 8.9–10.3)
Chloride: 104 mmol/L (ref 98–111)
Creatinine, Ser: 1.01 mg/dL (ref 0.61–1.24)
GFR, Estimated: >60 mL/min (ref >60)
Glucose, Bld: 109 mg/dL — ABNORMAL HIGH (ref 70–99)
Potassium: 4.4 mmol/L (ref 3.5–5.1)
Sodium: 142 mmol/L (ref 135–145)

## 2024-09-21 LAB — CK: Total CK: 495 U/L — ABNORMAL HIGH (ref 49–397)

## 2024-09-21 LAB — LACTIC ACID, PLASMA: Lactic Acid, Venous: 2.3 mmol/L (ref 0.5–1.9)

## 2024-09-21 MED ORDER — METOPROLOL SUCCINATE ER 50 MG PO TB24: 75.0000 mg | ORAL_TABLET | Freq: Every day | ORAL | Status: DC

## 2024-09-21 MED ORDER — DUTASTERIDE 0.5 MG PO CAPS
0.5000 mg | ORAL_CAPSULE | Freq: Every day | ORAL | Status: DC
  Administered 2024-09-22 – 2024-09-27 (×6): 0.5 mg via ORAL
  Filled 2024-09-21 (×6): qty 1

## 2024-09-21 MED ORDER — SIMETHICONE 80 MG PO CHEW: 80.0000 mg | CHEWABLE_TABLET | Freq: Four times a day (QID) | ORAL | Status: DC | PRN

## 2024-09-21 MED ORDER — SODIUM CHLORIDE 0.9 % IV SOLN
1.0000 g | Freq: Once | INTRAVENOUS | Status: AC
  Administered 2024-09-21: 1 g via INTRAVENOUS
  Filled 2024-09-21: qty 10

## 2024-09-21 MED ORDER — ONDANSETRON HCL 4 MG PO TABS: 4.0000 mg | ORAL_TABLET | Freq: Four times a day (QID) | ORAL | Status: DC | PRN

## 2024-09-21 MED ORDER — SODIUM CHLORIDE 0.9 % IV SOLN
1.0000 g | INTRAVENOUS | Status: DC
  Administered 2024-09-22 – 2024-09-23 (×2): 1 g via INTRAVENOUS
  Filled 2024-09-21 (×3): qty 10

## 2024-09-21 MED ORDER — ONDANSETRON HCL 4 MG/2ML IJ SOLN
4.0000 mg | Freq: Four times a day (QID) | INTRAMUSCULAR | Status: DC | PRN
  Filled 2024-09-21: qty 2

## 2024-09-21 MED ORDER — PANTOPRAZOLE SODIUM 40 MG PO TBEC: 40.0000 mg | DELAYED_RELEASE_TABLET | Freq: Every day | ORAL | Status: DC

## 2024-09-21 MED ORDER — TAMSULOSIN HCL 0.4 MG PO CAPS
0.4000 mg | ORAL_CAPSULE | Freq: Every day | ORAL | Status: DC
  Administered 2024-09-22 – 2024-09-27 (×6): 0.4 mg via ORAL
  Filled 2024-09-21 (×6): qty 1

## 2024-09-21 MED ORDER — ACETAMINOPHEN 500 MG PO TABS
1000.0000 mg | ORAL_TABLET | Freq: Once | ORAL | Status: AC
  Administered 2024-09-21: 1000 mg via ORAL
  Filled 2024-09-21: qty 2

## 2024-09-21 MED ORDER — SODIUM CHLORIDE 0.9 % IV SOLN: INTRAVENOUS | Status: DC

## 2024-09-21 MED ORDER — ABACAVIR-DOLUTEGRAVIR-LAMIVUD 600-50-300 MG PO TABS
1.0000 | ORAL_TABLET | Freq: Every day | ORAL | Status: DC
  Administered 2024-09-22 – 2024-09-27 (×6): 1 via ORAL
  Filled 2024-09-21 (×6): qty 1

## 2024-09-21 MED ORDER — LIDOCAINE 5 % EX PTCH
1.0000 | MEDICATED_PATCH | CUTANEOUS | Status: DC
  Administered 2024-09-21 – 2024-09-26 (×6): 1 via TRANSDERMAL
  Filled 2024-09-21 (×7): qty 1

## 2024-09-21 MED ORDER — EZETIMIBE 10 MG PO TABS
10.0000 mg | ORAL_TABLET | Freq: Every day | ORAL | Status: DC
  Administered 2024-09-22 – 2024-09-27 (×6): 10 mg via ORAL
  Filled 2024-09-21 (×6): qty 1

## 2024-09-21 MED ORDER — VITAMIN D (ERGOCALCIFEROL) 1.25 MG (50000 UNIT) PO CAPS: 50000.0000 [IU] | ORAL_CAPSULE | ORAL | Status: DC

## 2024-09-21 MED ORDER — ENOXAPARIN SODIUM 40 MG/0.4ML IJ SOSY
40.0000 mg | PREFILLED_SYRINGE | INTRAMUSCULAR | Status: DC
  Administered 2024-09-21 – 2024-09-26 (×6): 40 mg via SUBCUTANEOUS
  Filled 2024-09-21 (×6): qty 0.4

## 2024-09-21 MED ORDER — KETOTIFEN FUMARATE 0.035 % OP SOLN: 1.0000 [drp] | Freq: Two times a day (BID) | OPHTHALMIC | Status: DC

## 2024-09-21 MED ORDER — CLOPIDOGREL BISULFATE 75 MG PO TABS
75.0000 mg | ORAL_TABLET | Freq: Every day | ORAL | Status: DC
  Administered 2024-09-22 – 2024-09-27 (×6): 75 mg via ORAL
  Filled 2024-09-21 (×6): qty 1

## 2024-09-21 MED ORDER — ACETAMINOPHEN 325 MG PO TABS
650.0000 mg | ORAL_TABLET | Freq: Four times a day (QID) | ORAL | Status: DC | PRN
  Administered 2024-09-23 – 2024-09-24 (×3): 650 mg via ORAL
  Filled 2024-09-21 (×3): qty 2

## 2024-09-21 MED ORDER — SODIUM CHLORIDE 0.9 % IV BOLUS
1000.0000 mL | Freq: Once | INTRAVENOUS | Status: AC
  Administered 2024-09-21: 1000 mL via INTRAVENOUS

## 2024-09-21 MED ORDER — LORATADINE 10 MG PO TABS: 10.0000 mg | ORAL_TABLET | Freq: Every day | ORAL | Status: DC | PRN

## 2024-09-21 MED ORDER — ACETAMINOPHEN 650 MG RE SUPP: 650.0000 mg | Freq: Four times a day (QID) | RECTAL | Status: DC | PRN

## 2024-09-21 MED ORDER — DULOXETINE HCL 30 MG PO CPEP: 30.0000 mg | ORAL_CAPSULE | Freq: Every day | ORAL | Status: DC

## 2024-09-21 NOTE — Assessment & Plan Note (Signed)
 Most likely secondary to UTI as patient has significant pyuria Per family patient noted to be increasingly confused which is not his baseline which normally happens when he has a urinary tract infection. Place patient empirically on Rocephin  1 g IV daily Expect improvement in patient's mental status following resolution of acute infection

## 2024-09-21 NOTE — Assessment & Plan Note (Signed)
 PT and OT are recommending SNF TOC consult

## 2024-09-21 NOTE — Assessment & Plan Note (Signed)
 Traumatic and following a fall Total CK 495>>770 - Continue with IV fluid -Monitor CK

## 2024-09-21 NOTE — Assessment & Plan Note (Signed)
 Continue HAART

## 2024-09-21 NOTE — ED Triage Notes (Signed)
 Pt to ED [facility] from [facility], reports fell last night, unknown why, hit head, unknown blood thinner use. Was unable to get out of floor until this am. C/o left hip pain.

## 2024-09-21 NOTE — Assessment & Plan Note (Addendum)
 With left hip pain rule out an occult fracture Obtain CT scan of left hip Place patient on fall precautions PT evaluation

## 2024-09-21 NOTE — ED Provider Notes (Addendum)
 [facility] Provider Note    Event Date/Time   First MD Initiated Contact with Patient 09/21/24 1103     (approximate)   History   Fall  Formal translating service utilized   HPI  Brian [name] is a 83 y.o. male past medical history significant for HIV, prior CVA, presents to the emergency department after being found down.  Patient presents from [facility].  Reports that he fell last night.  Is uncertain of why but did state that he hit his head.  Was on the floor and unable to get up until this morning.  Complaining of pain to his left hip.  Denies being on any anticoagulation.  Denies headache or neck pain.  Denies any back pain.  Denies any chest pain or shortness of breath.  Denies nausea or vomiting.  States that he fell while trying to chase a cockroach in the kitchen.  Complaining of some mild pain to his left hip.  States that he uses a wheelchair.     Physical Exam   Triage Vital Signs: ED Triage Vitals  Encounter Vitals Group     BP      Girls Systolic BP Percentile      Girls Diastolic BP Percentile      Boys Systolic BP Percentile      Boys Diastolic BP Percentile      Pulse      Resp      Temp      Temp src      SpO2      Weight      Height      Head Circumference      Peak Flow      Pain Score      Pain Loc      Pain Education      Exclude from Growth Chart     Most recent vital signs: Vitals:   09/21/24 1106 09/21/24 1217  BP: (!) 132/106   Pulse: (!) 103   Resp: 18   Temp:  97.9 F (36.6 C)  SpO2: 95%     Physical Exam Constitutional:      Appearance: He is well-developed.  HENT:     Head: Atraumatic.     Mouth/Throat:     Mouth: Mucous membranes are moist.  Eyes:     Extraocular Movements: Extraocular movements intact.     Conjunctiva/sclera: Conjunctivae normal.     Pupils: Pupils are equal, round, and reactive to light.  Cardiovascular:     Rate and Rhythm: Regular rhythm.  Pulmonary:     Effort:  No respiratory distress.  Abdominal:     Tenderness: There is no abdominal tenderness.  Musculoskeletal:     Cervical back: Normal range of motion. No tenderness.     Comments: Pain with tenderness to palpation to the left hip.  +2 DP pulses that are equal bilaterally.  No pain to the right hip.  No midline thoracic or lumbar tenderness to palpation.  Skin:    General: Skin is warm.     Capillary Refill: Capillary refill takes less than 2 seconds.  Neurological:     Mental Status: He is alert. Mental status is at baseline.     IMPRESSION / MDM / ASSESSMENT AND PLAN / ED COURSE  I reviewed the triage vital signs and the nursing notes.  Differential diagnosis including intracranial hemorrhage, fracture, musculoskeletal strain, electrolyte abnormality, dehydration, rhabdomyolysis, dysrhythmia   EKG  I, Zenna Traister, the  attending physician, personally viewed and interpreted this ECG.  Sinus tachycardia with heart rate of 106.  Normal intervals.  No chamber enlargement.  No significant ST elevation or depression.  No findings of acute ischemia or dysrhythmia.   RADIOLOGY I independently reviewed imaging, my interpretation of imaging: CT scan of the head and cervical spine -no signs of intracranial hemorrhage.  Read as no acute findings.  X-ray left hip x-ray of the hip with no obvious fracture.  Read as no acute finding.  LABS (all labs ordered are listed, but only abnormal results are displayed) Labs interpreted as -    Labs Reviewed  CBC - Abnormal; Notable for the following components:      Result Value   WBC 16.6 (*)    All other components within normal limits  BASIC METABOLIC PANEL WITH GFR - Abnormal; Notable for the following components:   CO2 21 (*)    Glucose, Bld 109 (*)    BUN 29 (*)    Anion gap 17 (*)    All other components within normal limits  CK - Abnormal; Notable for the following components:   Total CK 495 (*)    All other components within normal  limits  URINALYSIS, W/ REFLEX TO CULTURE (INFECTION SUSPECTED)     MDM  Given Tylenol  for pain control   Clinical Course as of 09/21/24 1556  Tue Sep 21, 2024  1227 Patient is that he is wheelchair-bound but can usually stand for transfer.  Patient able to stand and bear weight entirely to the left leg and hip.  Denies any complaints and states that he just wants some water.  Able to range the left hip with no significant pain, no midline thoracic or lumbar tenderness palpation of low suspicion for fracture.  No significant pain or tenderness to the left knee. [SM]  1311 Lab work hemolyzed, lab to redraw [SM]    Clinical Course User Index [SM] Suzanne Kirsch, MD   Patient has leukocytosis on his labs.  Creatinine appears to be at his baseline.  CO2 mildly low at 21.  Patient states he is feeling well.  Able to tolerate p.o. in the emergency department.  Plan to get a urine prior to discharge.  If findings concerning for urinary tract infection we will treat.  Care transferred to incoming provider with UA pending   PROCEDURES:  Critical Care performed: No  Procedures  Patient's presentation is most consistent with acute presentation with potential threat to life or bodily function.   MEDICATIONS ORDERED IN ED: Medications  acetaminophen  (TYLENOL ) tablet 1,000 mg (1,000 mg Oral Given 09/21/24 1146)    FINAL CLINICAL IMPRESSION(S) / ED DIAGNOSES   Final diagnoses:  Fall, initial encounter  Left hip pain     Rx / DC Orders   ED Discharge Orders     None        Note:  This document was prepared using Dragon voice recognition software and may include unintentional dictation errors.   Suzanne Kirsch, MD 09/21/24 1414    Suzanne Kirsch, MD 09/21/24 1512    Suzanne Kirsch, MD 09/21/24 1556

## 2024-09-21 NOTE — ED Notes (Signed)
 Patient transported to X-ray

## 2024-09-21 NOTE — H&P (Signed)
 History and Physical    Patient: Brian [name] FMW:[id number] DOB: 1941/07/01 DOA: 09/21/2024 DOS: the patient was seen and examined on 09/21/2024 PCP: [name], MD  Patient coming from: Home  Chief Complaint:  Chief Complaint  Patient presents with   Fall   Also history was obtained from his daughter-in-law at the bedside.  Patient is Spanish-speaking HPI: Brian [name] is a 83 y.o. 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  Patient notes that he fell the night prior to presentation, he tripped while trying to kill a cockroach.  Daughter-in-law states that at baseline he usually ambulates with a walker and he had the walker when he fell.  He was unable to get up and laid on the ground until the morning when he was found.  Daughter-in-law notes that he has been increasingly weak over the past several days.  He has had poor oral intake and she notes that he has been increasingly confused.  He complains of pain involving his left hip and according to his daughter-in-law he was unable to bear weight on his left leg due to severity of the pain. He notes that he hit his head when he fell but is unable to tell me if he lost consciousness. He has urinary incontinence at baseline but denies having any dysuria, no frequency, no fever, no chills, no chest pain, no shortness of breath, no dizziness, no lightheadedness, no blurred vision no focal deficit. Abnormal labs include total CK4 95, white count 16.6, BUN 29 and significant pyuria Prior urine culture yielded E. coli and Klebsiella pneumonia both sensitive to cephalosporins Patient received 1 L of IV fluid in the ER and Rocephin  1 g IV  He will be admitted to the hospital for further evaluation CODE STATUS was discussed with his son Lukas Pelcher over the phone  and he notes that patient is a DO NOT RESUSCITATE    Review of Systems: As mentioned in the history of present illness. All other systems reviewed and are negative. Past Medical History:  Diagnosis Date   Arthritis    Asthma    Bradycardia    Depression    Enlarged prostate    HIV (human immunodeficiency virus infection) (HCC)    Hyperlipidemia    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   Stroke [facility])    Past Surgical History:  Procedure Laterality Date   cataracts     EYE SURGERY     PACEMAKER INSERTION N/A 08/02/2015   Procedure: INSERTION PACEMAKER;  Surgeon: Sheralyn Flock, MD;  Location: [name];  Service: Cardiovascular;  Laterality: N/A;   Social History:  reports that he has quit smoking. His smoking use included cigarettes. He has never used smokeless tobacco. He reports current alcohol  use of about 4.0 standard drinks of alcohol  per week. He reports that he does not use drugs.  No Known Allergies  Family History  Problem Relation Age of Onset   Heart disease Brother    Hypertension Other     Prior to Admission medications   Medication Sig Start Date End Date Taking? Authorizing Provider  abacavir -dolutegravir -lamiVUDine  (TRIUMEQ ) 600-50-300 MG tablet Take 1 tablet by mouth daily. 06/25/24   Fayette Bodily, MD  alendronate  (FOSAMAX ) 70 MG tablet Take 70 mg by mouth once a week. Take on Saturday. 07/21/15   [provider]  azelastine  (  OPTIVAR ) 0.05 % ophthalmic solution Apply 1 drop to eye 2 (two) times daily. 03/28/21   [provider]  cloNIDine  (CATAPRES ) 0.2 MG tablet Take 1 tablet (0.2 mg total) by mouth every 4 (four) hours as needed (SBP >170 or DBP >100). 12/01/23   Laurence Locus, DO  clopidogrel  (PLAVIX ) 75 MG tablet Take 75 mg by mouth daily. 05/12/23   [provider]  DULoxetine  (CYMBALTA ) 30 MG capsule Take 30 mg by mouth daily. 08/14/20   [provider]  dutasteride  (AVODART ) 0.5 MG capsule Take 1 capsule (0.5 mg total) by mouth  daily. 10/25/20   Francisca Redell BROCKS, MD  ezetimibe  (ZETIA ) 10 MG tablet Take 10 mg by mouth daily. 06/25/22   [provider]  lidocaine  (LIDODERM ) 5 % Place 1 patch onto the skin daily. Remove & Discard patch within 12 hours or as directed by MD 05/06/24   Reddick, Johnathan B, NP  loratadine  (CLARITIN ) 10 MG tablet Take 10 mg by mouth daily.    [provider]  meloxicam  (MOBIC ) 7.5 MG tablet Take 1 tablet (7.5 mg total) by mouth daily. 05/06/24   Reddick, Johnathan B, NP  metoprolol  succinate (TOPROL -XL) 25 MG 24 hr tablet Take 3 tablets (75 mg total) by mouth daily. 12/01/23   Laurence Locus, DO  pantoprazole  (PROTONIX ) 40 MG tablet Take 1 tablet (40 mg total) by mouth daily. 05/03/22   Ghimire, Kuber, MD  simethicone  (MYLICON) 80 MG chewable tablet Chew 1 tablet (80 mg total) by mouth every 6 (six) hours as needed for flatulence. 05/02/22   Raenelle Coria, MD  tamsulosin  (FLOMAX ) 0.4 MG CAPS capsule Take 1 capsule (0.4 mg total) by mouth daily. 10/25/20   Francisca Redell BROCKS, MD  VENTOLIN  HFA 108 (90 BASE) MCG/ACT inhaler Inhale 2 puffs into the lungs 4 (four) times daily as needed. For shortness of breath and/or wheezing. 07/21/15   [provider]  Vitamin D , Ergocalciferol , (DRISDOL ) 1.25 MG (50000 UNIT) CAPS capsule Take 50,000 Units by mouth once a week. 07/03/23   [provider]    Physical Exam: Vitals:   09/21/24 1106 09/21/24 1115 09/21/24 1217 09/21/24 1757  BP: (!) 132/106   (!) 142/92  Pulse: (!) 103   98  Resp: 18     Temp:   97.9 F (36.6 C) (!) 97.5 F (36.4 C)  TempSrc:   Oral Oral  SpO2: 95%   97%  Weight:  59.4 kg    Height:  5' 7 (1.702 m)     Physical Exam Vitals and nursing note reviewed.  Constitutional:      Comments: Chronically ill-appearing  HENT:     Head: Normocephalic.     Nose: Nose normal.     Mouth/Throat:     Mouth: Mucous membranes are moist.  Eyes:     Conjunctiva/sclera: Conjunctivae normal.  Cardiovascular:     Rate  and Rhythm: Tachycardia present.  Pulmonary:     Effort: Pulmonary effort is normal.     Breath sounds: Normal breath sounds.  Abdominal:     General: Abdomen is flat. Bowel sounds are normal.     Palpations: Abdomen is soft.  Musculoskeletal:     Cervical back: Normal range of motion and neck supple.     Comments: Decreased range of motion left hip  Skin:    General: Skin is warm and dry.  Neurological:     Mental Status: He is alert.     Motor: Weakness present.  Comments: Oriented to person and place.  Generalized weakness  Psychiatric:     Comments: Unable to assess     Data Reviewed: Data Reviewed: Relevant notes from primary care and specialist visits, past discharge summaries as available in EHR, including Care Everywhere. Prior diagnostic testing as pertinent to current admission diagnoses Updated medications and problem lists for reconciliation ED course, including vitals, labs, imaging, treatment and response to treatment Triage notes, nursing and pharmacy notes and ED provider's notes Notable results as noted in HPI Sodium 142, potassium 4.4, chloride 104, bicarb 21, glucose 109, BUN 29, creatinine 1.01, calcium  9.9, total CK4 95, white count 16.6, hemoglobin 17.0, hematocrit 50.3, platelet count 282 Urine analysis showed pyuria Cervical spine CT shows No evidence of acute cervical spine fracture, traumatic subluxation or static signs of instability. Stable multilevel cervical spondylosis with associated foraminal narrowing as described. Emphysema CT scan of the head without contrast shows no acute intracranial abnormality related to head trauma. Diffuse cerebral parenchymal volume loss.Moderate chronic ischemic microvascular disease. Chronic lacunar infarcts within bilateral thalami and right cerebellar hemisphere. Left hip x-ray shows no evidence of acute fracture or dislocation. Stable mild degenerative changes. Twelve-lead EKG reviewed by me shows sinus  tachycardia, incomplete right bundle branch block Labs reviewed  Assessment and Plan: * Acute metabolic encephalopathy Most likely secondary to UTI as patient has significant pyuria Per family patient noted to be increasingly confused which is not his baseline which normally happens when he has a urinary tract infection. Place patient empirically on Rocephin  1 g IV daily Expect improvement in patient's mental status following resolution of acute infection  Rhabdomyolysis Traumatic and following a fall Total CK 495 Hydrate patient and repeat CK levels in a.m.  UTI (urinary tract infection) Patient with significant pyuria Prior urine culture yielded E. coli and Klebsiella pneumonia both sensitive to cephalosporins Continue antibiotic therapy with Rocephin  1 g IV daily empirically until urine culture results become available  Generalized weakness Most likely related to acute illness Will request PT evaluation  Fall at home, initial encounter With left hip pain rule out an occult fracture Obtain CT scan of left hip Place patient on fall precautions PT evaluation  Stroke (HCC) History of a prior stroke with residual right upper extremity weakness Continue Plavix  Hold statins due to rhabdomyolysis  HIV (human immunodeficiency virus infection) (HCC) Continue HAART      Advance Care Planning:   Code Status: Limited: Do not attempt resuscitation (DNR) -DNR-LIMITED -Do Not Intubate/DNI    Consults: None  Family Communication: Patient's condition and plan of care discussed with his daughter-in-law at the bedside as well as his son, [name] over the phone.  CODE STATUS was discussed and patient is a DO NOT RESUSCITATE  Severity of Illness: The appropriate patient status for this patient is OBSERVATION. Observation status is judged to be reasonable and necessary in order to provide the required intensity of service to ensure the patient's safety. The patient's presenting symptoms,  physical exam findings, and initial radiographic and laboratory data in the context of their medical condition is felt to place them at decreased risk for further clinical deterioration. Furthermore, it is anticipated that the patient will be medically stable for discharge from the hospital within 2 midnights of admission.   Author: Aimee Somerset, MD 09/21/2024 6:48 PM  For on call review [email or web address].

## 2024-09-21 NOTE — Assessment & Plan Note (Addendum)
 History of a prior stroke with residual right upper extremity weakness Continue Plavix  Hold statins due to rhabdomyolysis

## 2024-09-21 NOTE — Assessment & Plan Note (Signed)
 Patient with significant pyuria Prior urine culture yielded E. coli and Klebsiella pneumonia both sensitive to cephalosporins, urine cultures pending. - Continue with ceftriaxone  - Follow-up urine culture results

## 2024-09-21 NOTE — ED Provider Notes (Signed)
 83 year old male who is signed out to me awaiting results of urinalysis due to concern of a possible urinary tract infection.  Apparently unwitnessed fall and was on the ground for an unknown amount of time.  Here he was noted to have a slight elevation in his CK for which he was given some fluid and should have help with his symptomatology.  He was awaiting urinalysis, which does demonstrate large amount of leuk esterase as well as nitrates and many bacteria.  I spoke with his daughter who is at bedside, it seems that he actually lives by himself and is able to ambulate appropriately independently.  She was away for about a week and returned 1 week ago, since then has been having difficulty getting around and relying on the wheelchair more.  Unfortunately given the findings on his baseline, likely would benefit from admission for continued treatment of UTI and fluids for rhabdomyolysis.  1750 - Spoke with Dr. Lanetta who has agreed to evaluate the patient to determine course of further medical management.   Fernand Rossie HERO, MD 09/21/24 [phone]

## 2024-09-21 NOTE — Assessment & Plan Note (Signed)
 Continue metoprolol  Blood pressure is stable

## 2024-09-22 DIAGNOSIS — J4489 Other specified chronic obstructive pulmonary disease: Secondary | ICD-10-CM | POA: Diagnosis present

## 2024-09-22 DIAGNOSIS — Y92009 Unspecified place in unspecified non-institutional (private) residence as the place of occurrence of the external cause: Secondary | ICD-10-CM | POA: Diagnosis not present

## 2024-09-22 DIAGNOSIS — W010XXA Fall on same level from slipping, tripping and stumbling without subsequent striking against object, initial encounter: Secondary | ICD-10-CM | POA: Diagnosis present

## 2024-09-22 DIAGNOSIS — Z21 Asymptomatic human immunodeficiency virus [HIV] infection status: Secondary | ICD-10-CM | POA: Diagnosis present

## 2024-09-22 DIAGNOSIS — I10 Essential (primary) hypertension: Secondary | ICD-10-CM | POA: Diagnosis present

## 2024-09-22 DIAGNOSIS — Z87891 Personal history of nicotine dependence: Secondary | ICD-10-CM | POA: Diagnosis not present

## 2024-09-22 DIAGNOSIS — M25552 Pain in left hip: Secondary | ICD-10-CM | POA: Diagnosis present

## 2024-09-22 DIAGNOSIS — Z7902 Long term (current) use of antithrombotics/antiplatelets: Secondary | ICD-10-CM | POA: Diagnosis not present

## 2024-09-22 DIAGNOSIS — W19XXXA Unspecified fall, initial encounter: Secondary | ICD-10-CM

## 2024-09-22 DIAGNOSIS — Z79899 Other long term (current) drug therapy: Secondary | ICD-10-CM | POA: Diagnosis not present

## 2024-09-22 DIAGNOSIS — Z8673 Personal history of transient ischemic attack (TIA), and cerebral infarction without residual deficits: Secondary | ICD-10-CM

## 2024-09-22 DIAGNOSIS — R531 Weakness: Secondary | ICD-10-CM | POA: Diagnosis present

## 2024-09-22 DIAGNOSIS — Z7983 Long term (current) use of bisphosphonates: Secondary | ICD-10-CM | POA: Diagnosis not present

## 2024-09-22 DIAGNOSIS — Z66 Do not resuscitate: Secondary | ICD-10-CM | POA: Diagnosis present

## 2024-09-22 DIAGNOSIS — Z993 Dependence on wheelchair: Secondary | ICD-10-CM | POA: Diagnosis not present

## 2024-09-22 DIAGNOSIS — E876 Hypokalemia: Secondary | ICD-10-CM | POA: Diagnosis present

## 2024-09-22 DIAGNOSIS — T796XXA Traumatic ischemia of muscle, initial encounter: Secondary | ICD-10-CM | POA: Diagnosis present

## 2024-09-22 DIAGNOSIS — Z950 Presence of cardiac pacemaker: Secondary | ICD-10-CM | POA: Diagnosis not present

## 2024-09-22 DIAGNOSIS — I69331 Monoplegia of upper limb following cerebral infarction affecting right dominant side: Secondary | ICD-10-CM | POA: Diagnosis not present

## 2024-09-22 DIAGNOSIS — E785 Hyperlipidemia, unspecified: Secondary | ICD-10-CM | POA: Diagnosis present

## 2024-09-22 DIAGNOSIS — N390 Urinary tract infection, site not specified: Secondary | ICD-10-CM | POA: Diagnosis present

## 2024-09-22 DIAGNOSIS — N3000 Acute cystitis without hematuria: Secondary | ICD-10-CM | POA: Diagnosis not present

## 2024-09-22 DIAGNOSIS — G9341 Metabolic encephalopathy: Secondary | ICD-10-CM | POA: Diagnosis present

## 2024-09-22 DIAGNOSIS — Z8249 Family history of ischemic heart disease and other diseases of the circulatory system: Secondary | ICD-10-CM | POA: Diagnosis not present

## 2024-09-22 DIAGNOSIS — R32 Unspecified urinary incontinence: Secondary | ICD-10-CM | POA: Diagnosis present

## 2024-09-22 LAB — CBC
HCT: 39.5 % (ref 39.0–52.0)
Hemoglobin: 13.9 g/dL (ref 13.0–17.0)
MCH: 32.3 pg (ref 26.0–34.0)
MCHC: 35.2 g/dL (ref 30.0–36.0)
MCV: 91.9 fL (ref 80.0–100.0)
Platelets: 243 K/uL (ref 150–400)
RBC: 4.3 MIL/uL (ref 4.22–5.81)
RDW: 13.4 % (ref 11.5–15.5)
WBC: 10.7 K/uL — ABNORMAL HIGH (ref 4.0–10.5)
nRBC: 0 % (ref 0.0–0.2)

## 2024-09-22 LAB — BASIC METABOLIC PANEL WITH GFR
Anion gap: 8 (ref 5–15)
BUN: 29 mg/dL — ABNORMAL HIGH (ref 8–23)
CO2: 25 mmol/L (ref 22–32)
Calcium: 8.5 mg/dL — ABNORMAL LOW (ref 8.9–10.3)
Chloride: 108 mmol/L (ref 98–111)
Creatinine, Ser: 0.98 mg/dL (ref 0.61–1.24)
GFR, Estimated: >60 mL/min (ref >60)
Glucose, Bld: 104 mg/dL — ABNORMAL HIGH (ref 70–99)
Potassium: 4.4 mmol/L (ref 3.5–5.1)
Sodium: 141 mmol/L (ref 135–145)

## 2024-09-22 LAB — CK: Total CK: 770 U/L — ABNORMAL HIGH (ref 49–397)

## 2024-09-22 LAB — LACTIC ACID, PLASMA: Lactic Acid, Venous: 1.2 mmol/L (ref 0.5–1.9)

## 2024-09-22 MED ORDER — SODIUM CHLORIDE 0.9 % IV SOLN: INTRAVENOUS | Status: AC

## 2024-09-22 MED ORDER — PHENOL 1.4 % MT LIQD
1.0000 | OROMUCOSAL | Status: DC | PRN
  Filled 2024-09-22: qty 177

## 2024-09-22 NOTE — Evaluation (Signed)
 Occupational Therapy Evaluation Patient Details Name: Brian [name] MRN: [id number] DOB: Jan 04, 1941 Today's Date: 09/22/2024   History of Present Illness   Brian [name] is a 83 y.o. 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  Reports L hip pain, imaging negative for fracture.     Clinical Impressions Patient presenting with decreased Ind in self care,balance, functional mobility/transfers, endurance, and safety awareness. Patient lives at home alone and uses RW for mobility. He has a PCA for 6hrs/day to assist with IADL tasks at home per chart review. Pt lethargic during session and having difficulty remaining awake during session. Pt needing mod A for bed mobility and sitting EOB with min A for balance. Pt having difficulty remaining awake. Attempted use of video interpreter but bad connection and it cut out several times. Pt returning to supine with max A and he quickly fell asleep.Patient will benefit from acute OT to increase overall independence in the areas of ADLs, functional mobility, and safety awareness in order to safely discharge.     If plan is discharge home, recommend the following:   A lot of help with walking and/or transfers;A lot of help with bathing/dressing/bathroom;Assistance with cooking/housework;Assist for transportation     Functional Status Assessment   Patient has had a recent decline in their functional status and demonstrates the ability to make significant improvements in function in a reasonable and predictable amount of time.     Equipment Recommendations   Other (comment) (defer to next venue of care)      Precautions/Restrictions   Precautions Precautions: Fall Recall of Precautions/Restrictions: Intact     Mobility Bed Mobility Overal bed mobility: Needs  Assistance Bed Mobility: Supine to Sit, Sit to Supine     Supine to sit: Mod assist Sit to supine: Max assist        Transfers                   General transfer comment: unable to attempt secondary to lethargy      Balance Overall balance assessment: Needs assistance Sitting-balance support: Feet supported Sitting balance-Leahy Scale: Fair                                     ADL either performed or assessed with clinical judgement   ADL Overall ADL's : Needs assistance/impaired                         Toilet Transfer: Maximal assistance Toilet Transfer Details (indicate cue type and reason): simulated                 Vision Patient Visual Report: No change from baseline              Pertinent Vitals/Pain Pain Assessment Pain Assessment: Faces Faces Pain Scale: Hurts a little bit Pain Location: L hip Pain Descriptors / Indicators: Discomfort, Sore Pain Intervention(s): Monitored during session, Repositioned     Extremity/Trunk Assessment Upper Extremity Assessment Upper Extremity Assessment: Generalized weakness   Lower Extremity Assessment Lower Extremity Assessment: Generalized weakness   Cervical / Trunk Assessment Cervical / Trunk Assessment: Normal   Communication Communication Communication: Impaired Factors Affecting Communication: Non - English speaking, interpreter not available   Cognition Arousal:  Lethargic Behavior During Therapy: Flat affect Cognition: No apparent impairments                               Following commands: Impaired Following commands impaired: Follows one step commands with increased time     Cueing  General Comments   Cueing Techniques: Verbal cues;Gestural cues              Home Living Family/patient expects to be discharged to:: Private residence Living Arrangements: Alone Available Help at Discharge: Family;Personal care attendant;Available  PRN/intermittently Type of Home: Apartment Home Access: Level entry     Home Layout: One level     Bathroom Shower/Tub: Producer, Television/film/video: Standard     Home Equipment: Shower seat - built in;Grab bars - tub/shower;Grab bars - toilet;Rolling Environmental Consultant (2 wheels);Wheelchair - manual          Prior Functioning/Environment Prior Level of Function : Independent/Modified Independent;History of Falls (last six months)             Mobility Comments: Uses RW for HH mobility, per DIL uses transport chair for community distances; pt repors x2 recent falls at home ADLs Comments: Has PC 6 hours a day to help with cleaning, meals    OT Problem List: Decreased strength;Impaired balance (sitting and/or standing);Decreased cognition;Decreased safety awareness;Decreased activity tolerance;Decreased coordination   OT Treatment/Interventions: Self-care/ADL training;Therapeutic exercise;Patient/family education;Balance training;Therapeutic activities;DME and/or AE instruction      OT Goals(Current goals can be found in the care plan section)   Acute Rehab OT Goals Patient Stated Goal: to rest OT Goal Formulation: With patient Time For Goal Achievement: 10/06/24 Potential to Achieve Goals: Fair ADL Goals Pt Will Perform Grooming: with supervision;standing Pt Will Perform Lower Body Dressing: with supervision;sit to/from stand Pt Will Transfer to Toilet: with supervision;ambulating Pt Will Perform Toileting - Clothing Manipulation and hygiene: with supervision;sit to/from stand   OT Frequency:  Min 2X/week       AM-PAC OT 6 Clicks Daily Activity     Outcome Measure Help from another person eating meals?: None Help from another person taking care of personal grooming?: None Help from another person toileting, which includes using toliet, bedpan, or urinal?: A Lot Help from another person bathing (including washing, rinsing, drying)?: A Lot Help from another person to  put on and taking off regular upper body clothing?: A Little Help from another person to put on and taking off regular lower body clothing?: A Lot 6 Click Score: 17   End of Session    Activity Tolerance: Patient limited by lethargy Patient left: in bed;with call bell/phone within reach;with bed alarm set  OT Visit Diagnosis: Unsteadiness on feet (R26.81);Repeated falls (R29.6);Muscle weakness (generalized) (M62.81);History of falling (Z91.81)                Time: 8464-8444 OT Time Calculation (min): 20 min Charges:  OT General Charges $OT Visit: 1 Visit OT Evaluation $OT Eval Low Complexity: 1 Low OT Treatments $Therapeutic Activity: 8-22 mins  Izetta Claude, MS, OTR/L , CBIS ascom [phone]  09/22/24, 4:29 PM

## 2024-09-22 NOTE — Hospital Course (Addendum)
 Partly taken from H&P.  Brian [name] is a 83 y.o. 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  Patient notes that he fell the night prior to presentation, he tripped while trying to kill a cockroach.  Family concern of progressive weakness over the past several days.  Poor p.o. intake and worsening confusion.  Baseline urinary incontinence.  On presentation hemodynamically stable, labs with leukocytosis at 16.6, CK 495, CO2 21, lactic acid 2.3, UA concerning for UTI-urine cultures were sent and patient was started on ceftriaxone  based on prior urine cultures.  11/26: Vital stable, improving leukocytosis, slight worsening of CK to 770, preliminary blood cultures negative, urine cultures pending.  PT is recommending SNF.  11/27: Hemodynamically stable, leukocytosis resolved, CK improved to 348.  Urine cultures with multiple species.  Awaiting disposition.  11/28: Vital stable, labs with mild hypokalemia at 3.3 which is being repleted.  Magnesium  normal at 2.0.  Antibiotics switched with Ceftin  for 2 more days to complete the course.  Pending disposition.  11/29: Remained hemodynamically stable, pending SNF placement.  11/30: Remained stable, completed the course of antibiotic.  Pending SNF placement.  12/1: Remained hemodynamically stable and is being discharged to SNF for rehab.  Patient was started on low-dose losartan  and home metoprolol  dose was decreased to 25 mg daily.  Completed the course of antibiotics for UTI  Patient will continue on current medications and need to have a close follow-up with his providers for further assistance.

## 2024-09-22 NOTE — Plan of Care (Signed)
  Problem: Health Behavior/Discharge Planning: Goal: Ability to manage health-related needs will improve Outcome: Progressing   Problem: Clinical Measurements: Goal: Will remain free from infection Outcome: Progressing   Problem: Activity: Goal: Risk for activity intolerance will decrease Outcome: Progressing   Problem: Coping: Goal: Level of anxiety will decrease Outcome: Progressing   

## 2024-09-22 NOTE — Evaluation (Signed)
 Physical Therapy Evaluation Patient Details Name: Brian [name] MRN: [id number] DOB: 07-20-1941 Today's Date: 09/22/2024  History of Present Illness  Brian [name] is a 83 y.o. 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  Reports L hip pain, imaging negative for fracture.   Clinical Impression  Patient received in bed, family at bedside. Family interpreted during session. He requires mod A for bed mobility, increased time and effort to get to edge of bed.  Patient is able to stand with mod A and take a few side steps with min A. Patient is limited by fatigue and weakness. He will continue to benefit from skilled PT to improve functional independence, strength and safety.       If plan is discharge home, recommend the following: A lot of help with walking and/or transfers;A lot of help with bathing/dressing/bathroom   Can travel by private vehicle   No    Equipment Recommendations None recommended by PT  Recommendations for Other Services       Functional Status Assessment Patient has had a recent decline in their functional status and demonstrates the ability to make significant improvements in function in a reasonable and predictable amount of time.     Precautions / Restrictions Precautions Precautions: Fall Recall of Precautions/Restrictions: Intact Restrictions Weight Bearing Restrictions Per Provider Order: No      Mobility  Bed Mobility Overal bed mobility: Needs Assistance Bed Mobility: Supine to Sit, Sidelying to Sit   Sidelying to sit: Min assist Supine to sit: Min assist          Transfers Overall transfer level: Needs assistance Equipment used: Rolling walker (2 wheels) Transfers: Sit to/from Stand Sit to Stand: Mod assist           General transfer comment: able to side step at  edge of bed with min A, became fatigued after a few steps    Ambulation/Gait                  Stairs            Wheelchair Mobility     Tilt Bed    Modified Rankin (Stroke Patients Only)       Balance Overall balance assessment: Needs assistance Sitting-balance support: Feet supported Sitting balance-Leahy Scale: Fair     Standing balance support: Bilateral upper extremity supported, During functional activity, Reliant on assistive device for balance Standing balance-Leahy Scale: Poor Standing balance comment: flexed posture in standing                             Pertinent Vitals/Pain Pain Assessment Pain Assessment: Faces Faces Pain Scale: Hurts a little bit Pain Location: L hip Pain Descriptors / Indicators: Discomfort, Sore Pain Intervention(s): Monitored during session, Repositioned    Home Living Family/patient expects to be discharged to:: Private residence Living Arrangements: Alone Available Help at Discharge: Family;Personal care attendant;Available PRN/intermittently Type of Home: Apartment Home Access: Level entry       Home Layout: One level Home Equipment: Shower seat - built in;Grab bars - tub/shower;Grab bars - toilet;Rolling Walker (2 wheels);Wheelchair - manual      Prior Function Prior Level of Function : Independent/Modified Independent;History of Falls (last six months)             Mobility  Comments: Uses RW for HH mobility, per DIL uses transport chair for community distances; pt repors x2 recent falls at home ADLs Comments: Has PC 6 hours a day to help with cleaning, meals     Extremity/Trunk Assessment   Upper Extremity Assessment Upper Extremity Assessment: Defer to OT evaluation    Lower Extremity Assessment Lower Extremity Assessment: Generalized weakness    Cervical / Trunk Assessment Cervical / Trunk Assessment: Normal  Communication   Communication Communication: Impaired Factors  Affecting Communication: Non - English speaking, interpreter not available    Cognition Arousal: Alert Behavior During Therapy: WFL for tasks assessed/performed   PT - Cognitive impairments: Difficult to assess Difficult to assess due to: Non-English speaking                       Following commands: Intact       Cueing Cueing Techniques: Verbal cues, Gestural cues     General Comments      Exercises     Assessment/Plan    PT Assessment Patient needs continued PT services  PT Problem List Decreased strength;Decreased range of motion;Decreased activity tolerance;Decreased balance;Decreased mobility;Pain       PT Treatment Interventions DME instruction;Gait training;Functional mobility training;Therapeutic activities;Therapeutic exercise;Balance training;Neuromuscular re-education;Patient/family education    PT Goals (Current goals can be found in the Care Plan section)  Acute Rehab PT Goals Patient Stated Goal: improve PT Goal Formulation: With patient/family Time For Goal Achievement: 10/07/24 Potential to Achieve Goals: Good    Frequency Min 2X/week     Co-evaluation               AM-PAC PT 6 Clicks Mobility  Outcome Measure Help needed turning from your back to your side while in a flat bed without using bedrails?: A Lot Help needed moving from lying on your back to sitting on the side of a flat bed without using bedrails?: A Lot Help needed moving to and from a bed to a chair (including a wheelchair)?: A Lot Help needed standing up from a chair using your arms (e.g., wheelchair or bedside chair)?: A Lot Help needed to walk in hospital room?: A Lot Help needed climbing 3-5 steps with a railing? : Total 6 Click Score: 11    End of Session Equipment Utilized During Treatment: Gait belt Activity Tolerance: Patient limited by fatigue Patient left: in bed;with call bell/phone within reach;with bed alarm set;with family/visitor present Nurse  Communication: Mobility status PT Visit Diagnosis: Unsteadiness on feet (R26.81);Other abnormalities of gait and mobility (R26.89);Muscle weakness (generalized) (M62.81);Difficulty in walking, not elsewhere classified (R26.2);Pain;History of falling (Z91.81) Pain - Right/Left: Left Pain - part of body: Hip    Time: 8965-8947 PT Time Calculation (min) (ACUTE ONLY): 18 min   Charges:   PT Evaluation $PT Eval Low Complexity: 1 Low PT Treatments $Therapeutic Activity: 8-22 mins PT General Charges $$ ACUTE PT VISIT: 1 Visit         Giovanie Lefebre, PT, GCS 09/22/24,11:08 AM

## 2024-09-22 NOTE — Progress Notes (Signed)
 Patient is from home presented after a fall. Daughter in law advised the patient had been lethargic and was actually using a walker when he fell. Imaging negative for neuro changes. UA shows UTI, awaiting cultures. Being treated with IV ABX and IVF at this time. Therapy recs are pending at this time. Please outreach to [facility] if needs are identified.

## 2024-09-22 NOTE — Progress Notes (Signed)
 Progress Note   Patient: Brian [name] FMW:[id number] DOB: 02-18-1941 DOA: 09/21/2024     1 DOS: the patient was seen and examined on 09/22/2024   Brief hospital course: Partly taken from H&P.  Brian [name] is a 83 y.o. 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  Patient notes that he fell the night prior to presentation, he tripped while trying to kill a cockroach.  Family concern of progressive weakness over the past several days.  Poor p.o. intake and worsening confusion.  Baseline urinary incontinence.  On presentation hemodynamically stable, labs with leukocytosis at 16.6, CK 495, CO2 21, lactic acid 2.3, UA concerning for UTI-urine cultures were sent and patient was started on ceftriaxone  based on prior urine cultures.  11/26: Vital stable, improving leukocytosis, slight worsening of CK to 770, preliminary blood cultures negative, urine cultures pending.  PT is recommending SNF.  Assessment and Plan: * Acute metabolic encephalopathy Most likely secondary to UTI as patient has significant pyuria Per family patient noted to be increasingly confused which is not his baseline which normally happens when he has a urinary tract infection. 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  Rhabdomyolysis Traumatic and following a fall Total CK 495>>770 - Continue with IV fluid -Monitor CK  UTI (urinary tract infection) Patient with significant pyuria Prior urine culture yielded E. coli and Klebsiella pneumonia both sensitive to cephalosporins, urine cultures pending. - Continue with ceftriaxone  - Follow-up urine culture results   Generalized weakness PT and OT are recommending  SNF TOC consult  History of stroke History of a prior stroke with residual right upper extremity weakness Continue Plavix  Hold statins due to rhabdomyolysis  Fall at home, initial encounter CT hip with a hematoma in the left gluteus maximus muscle, no acute fractures or hip dislocation. -PT and OT -Continue with pain management  Essential hypertension Continue metoprolol  Blood pressure is stable  HIV (human immunodeficiency virus infection) (HCC) Continue HAART   Subjective: Patient was still confused, oriented to self.  Feeling weak.  Daughter-in-law and son at bedside who help with interpretation.  Physical Exam: Vitals:   09/22/24 0432 09/22/24 0727 09/22/24 1148 09/22/24 1545  BP: 134/73 131/78 128/82 131/85  Pulse: (!) 102 91 97 98  Resp: 20 16 17 18   Temp: 97.9 F (36.6 C) 98.4 F (36.9 C) (!) 97.5 F (36.4 C) 98.5 F (36.9 C)  TempSrc: Oral Oral Oral Oral  SpO2: 97% 97% 96% 97%  Weight:      Height:       General.  Frail and malnourished elderly man, in no acute distress. Pulmonary.  Lungs clear bilaterally, normal respiratory effort. CV.  Regular rate and rhythm, no JVD, rub or murmur. Abdomen.  Soft, nontender, nondistended, BS positive. CNS.  Alert and oriented .  No focal neurologic deficit. Extremities.  No edema, no cyanosis, pulses intact and symmetrical.   Data Reviewed: Prior data reviewed  Family Communication: Discussed with son and DIL at bedside  Disposition: Status is: Inpatient Remains inpatient appropriate because: Severity of illness  Planned Discharge Destination: Skilled nursing facility  DVT prophylaxis.  Lovenox  Time spent: 50 minutes  This record has been created using Conservation officer, historic buildings. Errors have been sought and corrected,but  may not always be located. Such creation errors do not reflect on the standard of care.   Author: Amaryllis Dare, MD 09/22/2024 4:55 PM  For on call review [email or web address].

## 2024-09-23 DIAGNOSIS — G9341 Metabolic encephalopathy: Secondary | ICD-10-CM | POA: Diagnosis not present

## 2024-09-23 DIAGNOSIS — N3000 Acute cystitis without hematuria: Secondary | ICD-10-CM | POA: Diagnosis not present

## 2024-09-23 DIAGNOSIS — T796XXA Traumatic ischemia of muscle, initial encounter: Secondary | ICD-10-CM | POA: Diagnosis not present

## 2024-09-23 DIAGNOSIS — W19XXXA Unspecified fall, initial encounter: Secondary | ICD-10-CM | POA: Diagnosis not present

## 2024-09-23 LAB — BASIC METABOLIC PANEL WITH GFR
Anion gap: 10 (ref 5–15)
BUN: 11 mg/dL (ref 8–23)
CO2: 22 mmol/L (ref 22–32)
Calcium: 7.9 mg/dL — ABNORMAL LOW (ref 8.9–10.3)
Chloride: 109 mmol/L (ref 98–111)
Creatinine, Ser: 0.63 mg/dL (ref 0.61–1.24)
GFR, Estimated: >60 mL/min (ref >60)
Glucose, Bld: 89 mg/dL (ref 70–99)
Potassium: 3.7 mmol/L (ref 3.5–5.1)
Sodium: 141 mmol/L (ref 135–145)

## 2024-09-23 LAB — CBC
HCT: 37 % — ABNORMAL LOW (ref 39.0–52.0)
Hemoglobin: 12.8 g/dL — ABNORMAL LOW (ref 13.0–17.0)
MCH: 32.1 pg (ref 26.0–34.0)
MCHC: 34.6 g/dL (ref 30.0–36.0)
MCV: 92.7 fL (ref 80.0–100.0)
Platelets: 192 K/uL (ref 150–400)
RBC: 3.99 MIL/uL — ABNORMAL LOW (ref 4.22–5.81)
RDW: 13.5 % (ref 11.5–15.5)
WBC: 7.2 K/uL (ref 4.0–10.5)
nRBC: 0 % (ref 0.0–0.2)

## 2024-09-23 LAB — URINE CULTURE

## 2024-09-23 LAB — GLUCOSE, CAPILLARY: Glucose-Capillary: 153 mg/dL — ABNORMAL HIGH (ref 70–99)

## 2024-09-23 LAB — CK: Total CK: 348 U/L (ref 49–397)

## 2024-09-23 MED ORDER — MORPHINE SULFATE (PF) 2 MG/ML IV SOLN
2.0000 mg | INTRAVENOUS | Status: DC | PRN
  Administered 2024-09-24 – 2024-09-27 (×8): 2 mg via INTRAVENOUS
  Filled 2024-09-23 (×8): qty 1

## 2024-09-23 MED ORDER — DM-GUAIFENESIN ER 30-600 MG PO TB12
1.0000 | ORAL_TABLET | Freq: Two times a day (BID) | ORAL | Status: DC
  Administered 2024-09-23 – 2024-09-27 (×9): 1 via ORAL
  Filled 2024-09-23 (×9): qty 1

## 2024-09-23 MED ORDER — POLYETHYLENE GLYCOL 3350 17 G PO PACK
17.0000 g | PACK | Freq: Every day | ORAL | Status: DC
  Administered 2024-09-23 – 2024-09-27 (×3): 17 g via ORAL
  Filled 2024-09-23 (×5): qty 1

## 2024-09-23 NOTE — Plan of Care (Signed)

## 2024-09-23 NOTE — Assessment & Plan Note (Addendum)
 Resolved. Most likely secondary to UTI as patient has significant pyuria Per family patient noted to be increasingly confused which is not his baseline which normally happens when he has a urinary tract infection. 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 Mentation seems to be at baseline now

## 2024-09-23 NOTE — Assessment & Plan Note (Signed)
 PT and OT are recommending SNF TOC consult

## 2024-09-23 NOTE — Assessment & Plan Note (Signed)
 Traumatic and following a fall Total CK 495>>770>>348 Resolved.

## 2024-09-23 NOTE — Progress Notes (Signed)
 Progress Note   Patient: Brian [name] FMW:[id number] DOB: 05/05/1941 DOA: 09/21/2024     2 DOS: the patient was seen and examined on 09/23/2024   Brief hospital course: Partly taken from H&P.  Jaziah Goeller is a 83 y.o. 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  Patient notes that he fell the night prior to presentation, he tripped while trying to kill a cockroach.  Family concern of progressive weakness over the past several days.  Poor p.o. intake and worsening confusion.  Baseline urinary incontinence.  On presentation hemodynamically stable, labs with leukocytosis at 16.6, CK 495, CO2 21, lactic acid 2.3, UA concerning for UTI-urine cultures were sent and patient was started on ceftriaxone  based on prior urine cultures.  11/26: Vital stable, improving leukocytosis, slight worsening of CK to 770, preliminary blood cultures negative, urine cultures pending.  PT is recommending SNF.  11/27: Hemodynamically stable, leukocytosis resolved, CK improved to 348.  Urine cultures with multiple species.  Awaiting disposition.  Assessment and Plan: * Acute metabolic encephalopathy Most likely secondary to UTI as patient has significant pyuria Per family patient noted to be increasingly confused which is not his baseline which normally happens when he has a urinary tract infection. 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 Mentation seems slowly improving  Rhabdomyolysis Traumatic and following a fall Total CK 495>>770>>348 Resolved.  UTI (urinary tract infection) Patient with significant pyuria Prior urine culture yielded E. coli and Klebsiella pneumonia both sensitive to  cephalosporins, urine cultures with multiple species - Continue with ceftriaxone -will complete a 5-day course  Generalized weakness PT and OT are recommending SNF TOC consult  History of stroke History of a prior stroke with residual right upper extremity weakness Continue Plavix  Hold statins due to rhabdomyolysis  Fall at home, initial encounter CT hip with a hematoma in the left gluteus maximus muscle, no acute fractures or hip dislocation. -PT and OT -Continue with pain management  Essential hypertension Continue metoprolol  Blood pressure is stable  HIV (human immunodeficiency virus infection) (HCC) Continue HAART   Subjective: Patient was seen and examined today.  No new concern.  Still feeling weak.  Physical Exam: Vitals:   09/23/24 0147 09/23/24 0403 09/23/24 0729 09/23/24 0906  BP: 136/83 (!) 186/93 (!) 142/101 (!) 133/98  Pulse: 83 81 88 82  Resp:  20 20 18   Temp: (!) 97.4 F (36.3 C)  97.7 F (36.5 C) 97.8 F (36.6 C)  TempSrc: Oral  Oral Oral  SpO2: 100% 99% 97% 100%  Weight:      Height:       General.  Frail and malnourished elderly man, in no acute distress. Pulmonary.  Lungs clear bilaterally, normal respiratory effort. CV.  Regular rate and rhythm, no JVD, rub or murmur. Abdomen.  Soft, nontender, nondistended, BS positive. CNS.  Alert and oriented .  No focal neurologic deficit. Extremities.  No edema, no cyanosis, pulses intact and symmetrical.    Data Reviewed: Prior data reviewed  Family Communication: Talked with son on phone.  Disposition: Status is: Inpatient Remains inpatient appropriate because: Severity of illness  Planned Discharge Destination: Skilled nursing facility  DVT prophylaxis.  Lovenox  Time spent: 45 minutes  This record has been created using [employer]  voice recognition software. Errors have been sought and corrected,but may not always be located. Such creation errors do not reflect on the standard of care.    Author: Amaryllis Dare, MD 09/23/2024 2:42 PM  For on call review [email or web address].

## 2024-09-23 NOTE — Assessment & Plan Note (Addendum)
 Patient with significant pyuria Prior urine culture yielded E. coli and Klebsiella pneumonia both sensitive to cephalosporins, urine cultures with multiple species - Received ceftriaxone  for 3 days followed by Ceftin to complete the course of antibiotic.

## 2024-09-24 DIAGNOSIS — T796XXA Traumatic ischemia of muscle, initial encounter: Secondary | ICD-10-CM | POA: Diagnosis not present

## 2024-09-24 DIAGNOSIS — W19XXXA Unspecified fall, initial encounter: Secondary | ICD-10-CM | POA: Diagnosis not present

## 2024-09-24 DIAGNOSIS — N3000 Acute cystitis without hematuria: Secondary | ICD-10-CM | POA: Diagnosis not present

## 2024-09-24 DIAGNOSIS — G9341 Metabolic encephalopathy: Secondary | ICD-10-CM | POA: Diagnosis not present

## 2024-09-24 LAB — MAGNESIUM: Magnesium: 2 mg/dL (ref 1.7–2.4)

## 2024-09-24 LAB — CBC
HCT: 33.1 % — ABNORMAL LOW (ref 39.0–52.0)
Hemoglobin: 11.6 g/dL — ABNORMAL LOW (ref 13.0–17.0)
MCH: 32 pg (ref 26.0–34.0)
MCHC: 35 g/dL (ref 30.0–36.0)
MCV: 91.4 fL (ref 80.0–100.0)
Platelets: 208 K/uL (ref 150–400)
RBC: 3.62 MIL/uL — ABNORMAL LOW (ref 4.22–5.81)
RDW: 13 % (ref 11.5–15.5)
WBC: 6.1 K/uL (ref 4.0–10.5)
nRBC: 0 % (ref 0.0–0.2)

## 2024-09-24 LAB — BASIC METABOLIC PANEL WITH GFR
Anion gap: 8 (ref 5–15)
BUN: 9 mg/dL (ref 8–23)
CO2: 26 mmol/L (ref 22–32)
Calcium: 8.2 mg/dL — ABNORMAL LOW (ref 8.9–10.3)
Chloride: 107 mmol/L (ref 98–111)
Creatinine, Ser: 0.7 mg/dL (ref 0.61–1.24)
GFR, Estimated: >60 mL/min (ref >60)
Glucose, Bld: 88 mg/dL (ref 70–99)
Potassium: 3.3 mmol/L — ABNORMAL LOW (ref 3.5–5.1)
Sodium: 141 mmol/L (ref 135–145)

## 2024-09-24 MED ORDER — CEFUROXIME AXETIL 250 MG PO TABS
250.0000 mg | ORAL_TABLET | Freq: Two times a day (BID) | ORAL | Status: AC
  Administered 2024-09-24 – 2024-09-26 (×4): 250 mg via ORAL
  Filled 2024-09-24 (×4): qty 1

## 2024-09-24 MED ORDER — PROPOFOL 10 MG/ML IV BOLUS
INTRAVENOUS | Status: AC
  Filled 2024-09-24: qty 20

## 2024-09-24 MED ORDER — POTASSIUM CHLORIDE CRYS ER 20 MEQ PO TBCR
40.0000 meq | EXTENDED_RELEASE_TABLET | Freq: Once | ORAL | Status: AC
  Administered 2024-09-24: 40 meq via ORAL
  Filled 2024-09-24: qty 2

## 2024-09-24 NOTE — Plan of Care (Signed)

## 2024-09-24 NOTE — NC FL2 (Signed)
 [place]  MEDICAID FL2 LEVEL OF CARE FORM     IDENTIFICATION  Patient Name: Brian [name] Birthdate: 1941/07/25 Sex: male Admission Date (Current Location): 09/21/2024  [facility] and [place] Number:  Chiropodist and Address:  [facility], [address], [place], [place] [address]      Provider Number: [id number]  Attending Physician Name and Address:  Caleen Qualia, MD  Relative Name and Phone Number:  Ysidro Ramsay [phone]    Current Level of Care: Hospital Recommended Level of Care: Skilled Nursing Facility Prior Approval Number:    Date Approved/Denied:   PASRR Number: [id number]  Discharge Plan: SNF    Current Diagnoses: Patient Active Problem List   Diagnosis Date Noted   Fall at home, initial encounter 09/21/2024   Rhabdomyolysis 09/21/2024   Closed fracture of one rib of left side 11/29/2023   Generalized weakness 11/28/2023   UTI (urinary tract infection) 11/28/2023   Acute metabolic encephalopathy 11/28/2023   Sepsis (HCC) 07/17/2023   History of stroke 04/25/2022   Hyperlipidemia    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    Depression with anxiety    Essential hypertension 08/31/2020   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   Peripheral neuropathy 05/03/2020   COPD (chronic obstructive pulmonary disease) (HCC) 11/27/2018   AKI (acute kidney injury) 07/30/2015   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   Sleep apnea 07/30/2015   HIV (human immunodeficiency virus infection) (HCC) 07/30/2015   Depression 07/30/2015   BPH (benign prostatic hyperplasia) 07/30/2015    Orientation RESPIRATION BLADDER Height & Weight     Self, Place, Situation  Normal Incontinent Weight: 59.4 kg Height:  5' 7 (170.2 cm)  BEHAVIORAL SYMPTOMS/MOOD NEUROLOGICAL BOWEL NUTRITION STATUS      Continent Diet (2gram Sodium)  AMBULATORY STATUS COMMUNICATION OF NEEDS Skin   Extensive Assist Verbally Normal                        Personal Care Assistance Level of Assistance  Bathing, Dressing Bathing Assistance: Maximum assistance   Dressing Assistance: Maximum assistance     Functional Limitations Info             SPECIAL CARE FACTORS FREQUENCY  PT (By licensed PT), OT (By licensed OT)     PT Frequency: 5x week OT Frequency: 5x week            Contractures Contractures Info: Not present    Additional Factors Info  Code Status, Allergies Code Status Info: DNR Allergies Info: No Known Allergies           Current Medications (09/24/2024):  This is the current hospital active medication list Current Facility-Administered Medications  Medication Dose Route Frequency Provider Last Rate Last Admin   abacavir -dolutegravir -lamiVUDine  (TRIUMEQ ) 600-50-300 MG per tablet 1 tablet  1 tablet Oral Daily Agbata, Tochukwu, MD   1 tablet at 09/24/24 1146   acetaminophen  (TYLENOL ) tablet 650 mg  650 mg Oral Q6H PRN Agbata, Tochukwu, MD   650 mg at 09/24/24 0345   Or   acetaminophen  (TYLENOL ) suppository 650 mg  650 mg Rectal Q6H PRN Agbata, Tochukwu, MD       cefUROXime (CEFTIN) tablet 250 mg  250 mg Oral BID WC Amin, Sumayya, MD   250 mg at 09/24/24 1730   clopidogrel  (PLAVIX ) tablet 75 mg  75 mg Oral Daily Agbata, Tochukwu, MD   75 mg at 09/24/24 1146   dextromethorphan -guaiFENesin  (MUCINEX  DM) 30-600 MG per 12 hr tablet 1 tablet  1 tablet Oral BID Amin, Sumayya, MD   1 tablet at 09/24/24 1146   dutasteride  (AVODART ) capsule 0.5 mg  0.5 mg Oral Daily Agbata, Tochukwu, MD   0.5 mg at 09/24/24 1146   enoxaparin  (LOVENOX ) injection 40 mg  40 mg Subcutaneous Q24H Agbata, Tochukwu, MD   40 mg at 09/24/24 1730   ezetimibe  (ZETIA ) tablet 10 mg  10 mg Oral Daily Agbata, Tochukwu, MD   10 mg at 09/24/24 1146   lidocaine  (LIDODERM ) 5 % 1 patch  1 patch Transdermal Q24H Agbata, Tochukwu, MD   1 patch at 09/24/24 1743   loratadine  (CLARITIN ) tablet 10 mg  10 mg Oral Daily PRN Agbata, Tochukwu, MD       morphine   (PF) 2 MG/ML injection 2 mg  2 mg Intravenous Q4H PRN Mansy, Jan A, MD   2 mg at 09/24/24 1743   ondansetron  (ZOFRAN ) tablet 4 mg  4 mg Oral Q6H PRN Agbata, Tochukwu, MD       Or   ondansetron  (ZOFRAN ) injection 4 mg  4 mg Intravenous Q6H PRN Agbata, Tochukwu, MD       phenol (CHLORASEPTIC) mouth spray 1 spray  1 spray Mouth/Throat PRN Amin, Sumayya, MD       polyethylene glycol (MIRALAX  / GLYCOLAX ) packet 17 g  17 g Oral Daily Amin, Sumayya, MD   17 g at 09/24/24 1146   simethicone  (MYLICON) chewable tablet 80 mg  80 mg Oral Q6H PRN Agbata, Tochukwu, MD       tamsulosin  (FLOMAX ) capsule 0.4 mg  0.4 mg Oral Daily Agbata, Tochukwu, MD   0.4 mg at 09/24/24 1146     Discharge Medications: Please see discharge summary for a list of discharge medications.  Relevant Imaging Results:  Relevant Lab Results:   Additional Information SS# [id number]  Daved JONETTA Hamilton, RN

## 2024-09-24 NOTE — Progress Notes (Signed)
 Progress Note   Patient: Brian [name] FMW:[id number] DOB: Sep 12, 1941 DOA: 09/21/2024     3 DOS: the patient was seen and examined on 09/24/2024   Brief hospital course: Partly taken from H&P.  Brian [name] is a 83 y.o. 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  Patient notes that he fell the night prior to presentation, he tripped while trying to kill a cockroach.  Family concern of progressive weakness over the past several days.  Poor p.o. intake and worsening confusion.  Baseline urinary incontinence.  On presentation hemodynamically stable, labs with leukocytosis at 16.6, CK 495, CO2 21, lactic acid 2.3, UA concerning for UTI-urine cultures were sent and patient was started on ceftriaxone  based on prior urine cultures.  11/26: Vital stable, improving leukocytosis, slight worsening of CK to 770, preliminary blood cultures negative, urine cultures pending.  PT is recommending SNF.  11/27: Hemodynamically stable, leukocytosis resolved, CK improved to 348.  Urine cultures with multiple species.  Awaiting disposition.  11/28: Vital stable, labs with mild hypokalemia at 3.3 which is being repleted.  Magnesium normal at 2.0.  Antibiotics switched with Ceftin  for 2 more days to complete the course.  Pending disposition.  Assessment and Plan: * Acute metabolic encephalopathy Most likely secondary to UTI as patient has significant pyuria Per family patient noted to be increasingly confused which is not his baseline which normally happens when he has a urinary tract infection. 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 Mentation seems to be at baseline now  Rhabdomyolysis Traumatic and  following a fall Total CK 495>>770>>348 Resolved.  UTI (urinary tract infection) Patient with significant pyuria Prior urine culture yielded E. 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 -PT and OT -Continue with pain management  Essential hypertension Continue metoprolol  Blood pressure is stable  HIV (human immunodeficiency virus infection) (HCC) Continue HAART   Subjective: Patient was complaining of some backache, asked the nurse to to help him go to chair.  Physical Exam: Vitals:   09/23/24 1557 09/23/24 2015 09/24/24 0349 09/24/24 0721  BP: 135/84 104/71 119/89 (!) 157/79  Pulse: 85 (!) 103 82 69  Resp: 20 16 18 16   Temp: 98.4 F (36.9 C) 98.7 F (37.1 C) 98 F (36.7 C) 98.7 F (37.1 C)  TempSrc: Oral Oral    SpO2: 100% 97% 99% 98%  Weight:      Height:       General.  Frail and malnourished gentleman, in no acute distress. Pulmonary.  Lungs clear bilaterally, normal respiratory effort. CV.  Regular rate and rhythm, no JVD, rub or murmur. Abdomen.  Soft, nontender, nondistended, BS positive. CNS.  Alert and oriented x 2.  No focal neurologic deficit. Extremities.  No edema, no cyanosis, pulses intact and symmetrical.  Data Reviewed: Prior data reviewed  Family Communication:   Disposition: Status is: Inpatient Remains inpatient appropriate because: Severity of illness  Planned Discharge Destination: Skilled nursing facility  DVT prophylaxis.  Lovenox  Time spent:  44 minutes  This record has been created using Conservation officer, historic buildings. Errors have been sought and corrected,but may not always be located. Such creation errors do not reflect on the standard of care.   Author: Amaryllis Dare, MD 09/24/2024 3:35 PM  For on call review [email or web address].

## 2024-09-24 NOTE — TOC Initial Note (Addendum)
 Transition of Care [facility]) - Initial/Assessment Note    Patient Details  Name: Brian [name] MRN: [id number] Date of Birth: Nov 14, 1940  [facility]) CM/SW Contact:    Brian JONETTA Hamilton, RN Phone Number: 09/24/2024, 6:51 PM  Clinical Narrative:                  Late Entry:  Met with patient using iPad Interpreter Services, Brian [name] ID [phone]  Introduced self and explained role. Prior to hospital encounter patient living alone in apartment and independent in living. Discussed with patient therapy recommendations for SNF-STR. Patient verbalized agreement to this and requests that son Brian [name] and daughter Brian [name] be involved in choice of facility. Discussed with patient that I will submit referrals and present choice to him and children once offer(s) are received. Patient verbalized understanding and agreement to this plan.   Existing PASRR [id number]  FL2 sent for signature Facility search initiated in HUB  Patient will require prior auth once facility is selected. Patient will need current PT/OT notes as last documentation was 09/22/2024    Expected Discharge Plan: Skilled Nursing Facility Barriers to Discharge: Continued Medical Work up   Patient Goals and CMS Choice Patient states their goals for this hospitalization and ongoing recovery are:: to get back home          Expected Discharge Plan and Services   Discharge Planning Services: CM Consult   Living arrangements for the past 2 months: Apartment                                      Prior Living Arrangements/Services Living arrangements for the past 2 months: Apartment Lives with:: Self Patient language and need for interpreter reviewed:: Yes (requires Spanish interpreter) Do you feel safe going back to the place where you live?: Yes      Need for Family Participation in Patient Care: No (Comment) Care giver support system in place?: Yes (comment)   Criminal Activity/Legal Involvement Pertinent to  Current Situation/Hospitalization: No - Comment as needed  Activities of Daily Living      Permission Sought/Granted Permission sought to share information with : Oceanographer granted to share information with : Yes, Verbal Permission Granted              Emotional Assessment Appearance:: Appears stated age Attitude/Demeanor/Rapport: Engaged Affect (typically observed): Appropriate Orientation: : Oriented to Self, Oriented to Place, Oriented to Situation Alcohol  / Substance Use: Not Applicable Psych Involvement: No (comment)  Admission diagnosis:  Left hip pain [M25.552] Fall, initial encounter [W19.XXXA] Acute metabolic encephalopathy [G93.41] Patient Active Problem List   Diagnosis Date Noted   Fall at home, initial encounter 09/21/2024   Rhabdomyolysis 09/21/2024   Closed fracture of one rib of left side 11/29/2023   Generalized weakness 11/28/2023   UTI (urinary tract infection) 11/28/2023   Acute metabolic encephalopathy 11/28/2023   Sepsis (HCC) 07/17/2023   History of stroke 04/25/2022   Hyperlipidemia    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 [place] [address] Phone: [phone] Fax: [phone]     Social Drivers of Health (SDOH) Social History: SDOH Screenings   Food Insecurity: Patient Unable To Answer (11/29/2023)  Housing: Patient Unable To Answer (11/29/2023)   Transportation Needs: Patient Unable To Answer (11/29/2023)  Utilities: Patient Unable To Answer (11/29/2023)  Depression (PHQ2-9): Low Risk  (06/25/2022)  Social Connections: Patient Unable To Answer (11/29/2023)  Tobacco Use: Medium Risk (09/21/2024)   SDOH Interventions:     Readmission Risk Interventions     No data to display

## 2024-09-25 DIAGNOSIS — G9341 Metabolic encephalopathy: Secondary | ICD-10-CM | POA: Diagnosis not present

## 2024-09-25 DIAGNOSIS — W19XXXA Unspecified fall, initial encounter: Secondary | ICD-10-CM | POA: Diagnosis not present

## 2024-09-25 DIAGNOSIS — T796XXA Traumatic ischemia of muscle, initial encounter: Secondary | ICD-10-CM | POA: Diagnosis not present

## 2024-09-25 DIAGNOSIS — N3000 Acute cystitis without hematuria: Secondary | ICD-10-CM | POA: Diagnosis not present

## 2024-09-25 MED ORDER — LOSARTAN POTASSIUM 25 MG PO TABS
25.0000 mg | ORAL_TABLET | Freq: Every day | ORAL | Status: DC
  Administered 2024-09-25 – 2024-09-27 (×3): 25 mg via ORAL
  Filled 2024-09-25 (×3): qty 1

## 2024-09-25 NOTE — Progress Notes (Signed)
 Physical Therapy Treatment Patient Details Name: Brian [name] MRN: [id number] DOB: 29-Jul-1941 Today's Date: 09/25/2024   History of Present Illness Brian [name] is a 83 y.o. 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  Reports L hip pain, imaging negative for fracture.    PT Comments  Pt seen for PT tx with personal sitter present, utilized iPad interpreter [name] [phone]). Pt continues to require significant assistance for bed mobility, transfers & gait but is able to progress to ambulating 10 ft x 2 with RW & min<>mod assist. Continue to recommend post acute rehab <3 hours therapy/day upon d/c.    If plan is discharge home, recommend the following: A lot of help with walking and/or transfers;A lot of help with bathing/dressing/bathroom   Can travel by private vehicle     No  Equipment Recommendations  Other (comment) (defer to next venue)    Recommendations for Other Services       Precautions / Restrictions Precautions Precautions: Fall Restrictions Weight Bearing Restrictions Per Provider Order: No     Mobility  Bed Mobility Overal bed mobility: Needs Assistance Bed Mobility: Supine to Sit     Supine to sit: Mod assist, Used rails, HOB elevated (extra time, use of bed rails, assistance to move BLE off EOB & to upright trunk)          Transfers Overall transfer level: Needs assistance Equipment used: Rolling walker (2 wheels) Transfers: Sit to/from Stand Sit to Stand: Mod assist           General transfer comment: sit>stand from EOB, recliner, poor awareness re: hand placement as pt with BUE on RW vs pushing to standing, decreased awareness re: need to reach back prior to stand>sit    Ambulation/Gait Ambulation/Gait assistance: Min assist, Mod assist Gait Distance (Feet): 10 Feet (+ 10  ft) Assistive device: Rolling walker (2 wheels) Gait Pattern/deviations: Decreased step length - left, Decreased step length - right, Decreased stride length, Decreased dorsiflexion - right, Decreased dorsiflexion - left, Knee flexed in stance - right, Knee flexed in stance - left Gait velocity: decreased     General Gait Details: pt intermittently reaching for furniture for support with cuing to continue using RW   Stairs             Wheelchair Mobility     Tilt Bed    Modified Rankin (Stroke Patients Only)       Balance Overall balance assessment: Needs assistance Sitting-balance support: Feet supported Sitting balance-Leahy Scale: Fair     Standing balance support: During functional activity, Bilateral upper extremity supported, Reliant on assistive device for balance Standing balance-Leahy Scale: Poor                              Communication Communication Communication: Impaired Factors Affecting Communication:  (iPad interpreter [name] [phone]))  Cognition Arousal: Alert     PT - Cognitive impairments: Difficult to assess, Safety/Judgement, Problem solving (2/2 spanish speaking) Difficult to assess due to: Non-English speaking                       Following commands: Impaired Following commands impaired: Follows one step commands with increased time    Cueing Cueing Techniques: Verbal cues, Gestural cues  Exercises  General Comments        Pertinent Vitals/Pain Pain Assessment Pain Assessment: Faces Faces Pain Scale: Hurts little more Pain Location: buttocks Pain Descriptors / Indicators: Discomfort, Sore Pain Intervention(s): Repositioned, Monitored during session    Home Living                          Prior Function            PT Goals (current goals can now be found in the care plan section) Acute Rehab PT Goals Patient Stated Goal: improve PT Goal Formulation: With patient/family Time For  Goal Achievement: 10/07/24 Potential to Achieve Goals: Good Progress towards PT goals: Progressing toward goals    Frequency    Min 2X/week      PT Plan      Co-evaluation              AM-PAC PT 6 Clicks Mobility   Outcome Measure  Help needed turning from your back to your side while in a flat bed without using bedrails?: A Little Help needed moving from lying on your back to sitting on the side of a flat bed without using bedrails?: A Lot Help needed moving to and from a bed to a chair (including a wheelchair)?: A Lot Help needed standing up from a chair using your arms (e.g., wheelchair or bedside chair)?: A Lot Help needed to walk in hospital room?: A Lot Help needed climbing 3-5 steps with a railing? : Total 6 Click Score: 12    End of Session   Activity Tolerance: Patient tolerated treatment well Patient left: in chair;with chair alarm set;with call bell/phone within reach (personal sitter present) Nurse Communication: Mobility status PT Visit Diagnosis: Unsteadiness on feet (R26.81);Other abnormalities of gait and mobility (R26.89);Muscle weakness (generalized) (M62.81);Difficulty in walking, not elsewhere classified (R26.2);History of falling (Z91.81)     Time: 8548-8490 PT Time Calculation (min) (ACUTE ONLY): 18 min  Charges:    $Therapeutic Activity: 8-22 mins PT General Charges $$ ACUTE PT VISIT: 1 Visit                     Richerd Pinal, PT, DPT 09/25/24, 3:23 PM   Richerd CHRISTELLA Pinal 09/25/2024, 3:22 PM

## 2024-09-25 NOTE — Plan of Care (Signed)

## 2024-09-25 NOTE — Progress Notes (Signed)
 Progress Note   Patient: Brian [name] FMW:[id number] DOB: 09/25/1941 DOA: 09/21/2024     4 DOS: the patient was seen and examined on 09/25/2024   Brief hospital course: Partly taken from H&P.  Brian [name] is a 83 y.o. 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  Patient notes that he fell the night prior to presentation, he tripped while trying to kill a cockroach.  Family concern of progressive weakness over the past several days.  Poor p.o. intake and worsening confusion.  Baseline urinary incontinence.  On presentation hemodynamically stable, labs with leukocytosis at 16.6, CK 495, CO2 21, lactic acid 2.3, UA concerning for UTI-urine cultures were sent and patient was started on ceftriaxone  based on prior urine cultures.  11/26: Vital stable, improving leukocytosis, slight worsening of CK to 770, preliminary blood cultures negative, urine cultures pending.  PT is recommending SNF.  11/27: Hemodynamically stable, leukocytosis resolved, CK improved to 348.  Urine cultures with multiple species.  Awaiting disposition.  11/28: Vital stable, labs with mild hypokalemia at 3.3 which is being repleted.  Magnesium  normal at 2.0.  Antibiotics switched with Ceftin  for 2 more days to complete the course.  Pending disposition.  11/29: Remained hemodynamically stable, pending SNF placement.  Assessment and Plan: * Acute metabolic encephalopathy Resolved. Most likely secondary to UTI as patient has significant pyuria Per family patient noted to be increasingly confused which is not his baseline which normally happens when he has a urinary tract infection. - CT head negative for any acute abnormality but did show diffuse cerebral parenchymal volume loss and moderate chronic ischemic microvascular disease with chronic lacunar  infarcts. Mentation seems to be at baseline now  Rhabdomyolysis Traumatic and following a fall Total CK 495>>770>>348 Resolved.  UTI (urinary tract infection) Patient with significant pyuria Prior urine culture yielded E. 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 -PT and OT -Continue with pain management  Essential hypertension Continue metoprolol  Blood pressure is stable  HIV (human immunodeficiency virus infection) (HCC) Continue HAART   Subjective: Patient was seen and examined today.  No new concern.  Caregiver at bedside.  Physical Exam: Vitals:   09/24/24 1633 09/24/24 2036 09/25/24 0452 09/25/24 0731  BP: (!) 132/96 (!) 143/80 (!) 141/77 (!) 142/100  Pulse: 87 98 87 84  Resp: 14 16  18   Temp: 97.8 F (36.6 C) 98.6 F (37 C) 97.9 F (36.6 C) 98.6 F (37 C)  TempSrc: Oral     SpO2: 95% 99% 97% 97%  Weight:      Height:       General.  Frail and malnourished elderly man, in no acute distress. Pulmonary.  Lungs clear bilaterally, normal respiratory effort. CV.  Regular rate and rhythm, no JVD, rub or murmur. Abdomen.  Soft, nontender, nondistended, BS positive. CNS.  Alert and oriented .  No focal neurologic deficit. Extremities.  No  edema, no cyanosis, pulses intact and symmetrical.   Data Reviewed: Prior data reviewed  Family Communication: Discussed with caregiver at bedside  Disposition: Status is: Inpatient Remains inpatient appropriate because: Severity of  illness  Planned Discharge Destination: Skilled nursing facility  DVT prophylaxis.  Lovenox  Time spent: 43 minutes  This record has been created using Conservation officer, historic buildings. Errors have been sought and corrected,but may not always be located. Such creation errors do not reflect on the standard of care.   Author: Amaryllis Dare, MD 09/25/2024 2:17 PM  For on call review [email or web address].

## 2024-09-26 DIAGNOSIS — T796XXA Traumatic ischemia of muscle, initial encounter: Secondary | ICD-10-CM | POA: Diagnosis not present

## 2024-09-26 DIAGNOSIS — N3000 Acute cystitis without hematuria: Secondary | ICD-10-CM | POA: Diagnosis not present

## 2024-09-26 DIAGNOSIS — G9341 Metabolic encephalopathy: Secondary | ICD-10-CM | POA: Diagnosis not present

## 2024-09-26 DIAGNOSIS — W19XXXA Unspecified fall, initial encounter: Secondary | ICD-10-CM | POA: Diagnosis not present

## 2024-09-26 LAB — CULTURE, BLOOD (ROUTINE X 2)
Culture: NO GROWTH
Culture: NO GROWTH
Special Requests: ADEQUATE

## 2024-09-26 NOTE — TOC Progression Note (Addendum)
 Transition of Care [facility]) - Progression Note    Patient Details  Name: Brian [name] MRN: [id number] Date of Birth: October 20, 1941  [facility] CM/SW Contact  Aleksandr Pellow L Bryen [place], [place] Phone Number: 09/26/2024, 10:35 AM  Clinical Narrative:     CSW attempted to review bed offers with patients adult children to no avail. CSW left a message requesting a return call.   Patient has two bed offers.   2:23pm: CSW received a call from Ms. Nora regarding bed offers. CSW provided the facilities who have offered a bed. Ms. Sebesta chose [facility]. Patient is not medically ready for discharge but shara will be started once he is.   Expected Discharge Plan: Skilled Nursing Facility Barriers to Discharge: Continued Medical Work up               Expected Discharge Plan and Services   Discharge Planning Services: CM Consult   Living arrangements for the past 2 months: Apartment                                       Social Drivers of Health (SDOH) Interventions SDOH Screenings   Food Insecurity: Patient Unable To Answer (11/29/2023)  Housing: Patient Unable To Answer (11/29/2023)  Transportation Needs: Patient Unable To Answer (11/29/2023)  Utilities: Patient Unable To Answer (11/29/2023)  Depression (PHQ2-9): Low Risk  (06/25/2022)  Social Connections: Patient Unable To Answer (11/29/2023)  Tobacco Use: Medium Risk (09/21/2024)    Readmission Risk Interventions     No data to display

## 2024-09-26 NOTE — Plan of Care (Signed)

## 2024-09-26 NOTE — Progress Notes (Signed)
 Progress Note   Patient: Brian [name] FMW:[id number] DOB: 04/07/1941 DOA: 09/21/2024     5 DOS: the patient was seen and examined on 09/26/2024   Brief hospital course: Partly taken from H&P.  Rui Wordell is a 83 y.o. 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  Patient notes that he fell the night prior to presentation, he tripped while trying to kill a cockroach.  Family concern of progressive weakness over the past several days.  Poor p.o. intake and worsening confusion.  Baseline urinary incontinence.  On presentation hemodynamically stable, labs with leukocytosis at 16.6, CK 495, CO2 21, lactic acid 2.3, UA concerning for UTI-urine cultures were sent and patient was started on ceftriaxone  based on prior urine cultures.  11/26: Vital stable, improving leukocytosis, slight worsening of CK to 770, preliminary blood cultures negative, urine cultures pending.  PT is recommending SNF.  11/27: Hemodynamically stable, leukocytosis resolved, CK improved to 348.  Urine cultures with multiple species.  Awaiting disposition.  11/28: Vital stable, labs with mild hypokalemia at 3.3 which is being repleted.  Magnesium normal at 2.0.  Antibiotics switched with Ceftin  for 2 more days to complete the course.  Pending disposition.  11/29: Remained hemodynamically stable, pending SNF placement.  11/30: Remained stable, completed the course of antibiotic.  Pending SNF placement.  Assessment and Plan: * Acute metabolic encephalopathy Resolved. Most likely secondary to UTI as patient has significant pyuria Per family patient noted to be increasingly confused which is not his baseline which normally happens when he has a urinary tract infection. 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 Mentation seems to be at baseline now  Rhabdomyolysis Traumatic and following a fall Total CK 495>>770>>348 Resolved.  UTI (urinary tract infection) Patient with significant pyuria Prior urine culture yielded E. coli and Klebsiella pneumonia both sensitive to cephalosporins, urine cultures with multiple species - Received ceftriaxone  for 3 days followed by Ceftin  to complete the course of antibiotic.  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 -PT and OT -Continue with pain management  Essential hypertension Continue metoprolol  Blood pressure is stable  HIV (human immunodeficiency virus infection) (HCC) Continue HAART   Subjective: Patient was resting comfortably when seen today.  Caregiver at bedside.  No new concern  Physical Exam: Vitals:   09/25/24 1706 09/25/24 1940 09/26/24 0414 09/26/24 0717  BP: 95/75 95/64 110/81 115/70  Pulse: 95 100 88 74  Resp: 16 16 16 14   Temp: 98.6 F (37 C) (!) 97.5 F (36.4 C) (!) 97.2 F (36.2 C) (!) 97.4 F (36.3 C)  TempSrc:  Oral  Oral  SpO2: 92% 99% 97% 98%  Weight:      Height:       General.  Frail and malnourished elderly man, in no acute distress. Pulmonary.  Lungs clear bilaterally, normal respiratory effort. CV.  Regular rate and rhythm, no JVD, rub or murmur. Abdomen.  Soft, nontender, nondistended, BS positive. CNS.  Alert  and oriented .  No focal neurologic deficit. Extremities.  No edema,  pulses intact and symmetrical.  Data Reviewed: Prior data reviewed  Family Communication: Discussed with caregiver at  bedside  Disposition: Status is: Inpatient Remains inpatient appropriate because: Severity of illness  Planned Discharge Destination: Skilled nursing facility  DVT prophylaxis.  Lovenox  Time spent: 42 minutes  This record has been created using Conservation officer, historic buildings. Errors have been sought and corrected,but may not always be located. Such creation errors do not reflect on the standard of care.   Author: Amaryllis Dare, MD 09/26/2024 3:49 PM  For on call review [email or web address].

## 2024-09-26 NOTE — Care Management Important Message (Signed)
 Important Message  Patient Details  Name: Brian [name] MRN: [id number] Date of Birth: June 29, 1941   Important Message Given:  Yes - Medicare IM     Leveda Kendrix W, CMA 09/26/2024, 1:49 PM

## 2024-09-26 NOTE — Plan of Care (Signed)

## 2024-09-27 DIAGNOSIS — W19XXXA Unspecified fall, initial encounter: Secondary | ICD-10-CM | POA: Diagnosis not present

## 2024-09-27 DIAGNOSIS — G9341 Metabolic encephalopathy: Secondary | ICD-10-CM | POA: Diagnosis not present

## 2024-09-27 DIAGNOSIS — Z950 Presence of cardiac pacemaker: Secondary | ICD-10-CM | POA: Diagnosis not present

## 2024-09-27 DIAGNOSIS — M25552 Pain in left hip: Secondary | ICD-10-CM

## 2024-09-27 MED ORDER — METOPROLOL SUCCINATE ER 25 MG PO TB24: 25.0000 mg | ORAL_TABLET | Freq: Every day | ORAL | Status: AC

## 2024-09-27 MED ORDER — DM-GUAIFENESIN ER 30-600 MG PO TB12: 1.0000 | ORAL_TABLET | Freq: Two times a day (BID) | ORAL | Status: DC | PRN

## 2024-09-27 MED ORDER — LOSARTAN POTASSIUM 25 MG PO TABS: 25.0000 mg | ORAL_TABLET | Freq: Every day | ORAL | Status: DC

## 2024-09-27 MED ORDER — POLYETHYLENE GLYCOL 3350 17 G PO PACK: 17.0000 g | PACK | Freq: Every day | ORAL | Status: AC | PRN

## 2024-09-27 NOTE — TOC Transition Note (Addendum)
 Transition of Care [facility]) - Discharge Note   Patient Details  Name: Brian [name] MRN: [id number] Date of Birth: 09/30/1941  Transition of Care [facility]) CM/SW Contact:  Dalia GORMAN Fuse, RN Phone Number: 09/27/2024, 9:24 AM   Clinical Narrative:     Patient received secure chat from hospitalitis, the patient is medically ready to discharge to [facility] for STR. [facility] to transport.  Nurse to call report to [phone]  Final next level of care: Skilled Nursing Facility Barriers to Discharge: Barriers Resolved   Patient Goals and CMS Choice Patient states their goals for this hospitalization and ongoing recovery are:: to get back home          Discharge Placement              Patient chooses bed at: [facility] Patient to be transferred to facility by: [facility]      Discharge Plan and Services Additional resources added to the After Visit Summary for     Discharge Planning Services: CM Consult                                 Social Drivers of Health (SDOH) Interventions SDOH Screenings   Food Insecurity: Patient Unable To Answer (11/29/2023)  Housing: Patient Unable To Answer (11/29/2023)  Transportation Needs: Patient Unable To Answer (11/29/2023)  Utilities: Patient Unable To Answer (11/29/2023)  Depression (PHQ2-9): Low Risk  (06/25/2022)  Social Connections: Patient Unable To Answer (11/29/2023)  Tobacco Use: Medium Risk (09/21/2024)     Readmission Risk Interventions     No data to display

## 2024-09-27 NOTE — Progress Notes (Signed)
 Physical Therapy Treatment Patient Details Name: Brian [name] MRN: [id number] DOB: 1941/09/27 Today's Date: 09/27/2024   History of Present Illness Brian [name] is a 83 y.o. 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  Reports L hip pain, imaging negative for fracture.    PT Comments  Patient received in bed, interpreter utilized through [employer] [facility] #[id number]. Patient is agreeable to PT session. He reports buttock and L hip pain. Requires mod A for bed mobility with max cues. Patient is able to stand with min/mod A. He ambulated ~20 feet with RW and reports 8/10 pain in left hip. RN notified. Cues needed for safe use of AD. He will continue to benefit from skilled PT to improve mobility and independence.     If plan is discharge home, recommend the following: A lot of help with walking and/or transfers;A lot of help with bathing/dressing/bathroom   Can travel by private vehicle     No  Equipment Recommendations  Other (comment) (TBD)    Recommendations for Other Services       Precautions / Restrictions Precautions Precautions: Fall Recall of Precautions/Restrictions: Intact Precaution/Restrictions Comments: significant pain in L Hip Restrictions Weight Bearing Restrictions Per Provider Order: No     Mobility  Bed Mobility Overal bed mobility: Needs Assistance Bed Mobility: Supine to Sit     Supine to sit: Mod assist, HOB elevated, Used rails     General bed mobility comments: max cues for bed mobility    Transfers Overall transfer level: Needs assistance Equipment used: Rolling walker (2 wheels) Transfers: Sit to/from Stand Sit to Stand: Mod assist           General transfer comment: sit to stand from bed with mod A    Ambulation/Gait Ambulation/Gait assistance: Min assist, +2 physical  assistance Gait Distance (Feet): 20 Feet Assistive device: Rolling walker (2 wheels) Gait Pattern/deviations: Step-to pattern, Decreased step length - right, Decreased step length - left, Decreased stride length, Trunk flexed, Antalgic Gait velocity: decreased     General Gait Details: patient reporting 8/10 pain in left hip with mobility. Requires assistance and RW for safety. Cues for safe use of AD.   Stairs             Wheelchair Mobility     Tilt Bed    Modified Rankin (Stroke Patients Only)       Balance Overall balance assessment: Needs assistance Sitting-balance support: Feet supported Sitting balance-Leahy Scale: Good     Standing balance support: Bilateral upper extremity supported, During functional activity, Reliant on assistive device for balance Standing balance-Leahy Scale: Poor Standing balance comment: flexed posture in standing                            Communication Communication Communication: Impaired Factors Affecting Communication: Non - English speaking, interpreter not available  Cognition Arousal: Alert Behavior During Therapy: Flat affect   PT - Cognitive impairments: No apparent impairments Difficult to assess due to: Non-English speaking                       Following commands: Impaired Following commands impaired: Follows one step commands with increased time    Cueing Cueing Techniques: Verbal cues, Gestural cues  Exercises      General Comments  Pertinent Vitals/Pain Pain Assessment Pain Assessment: 0-10 Pain Score: 8  Pain Location: bottom, left hip Pain Descriptors / Indicators: Discomfort, Grimacing, Guarding, Sore Pain Intervention(s): Monitored during session, Patient requesting pain meds-RN notified, Repositioned    Home Living                          Prior Function            PT Goals (current goals can now be found in the care plan section) Acute Rehab PT  Goals Patient Stated Goal: improve PT Goal Formulation: With patient/family Time For Goal Achievement: 10/07/24 Potential to Achieve Goals: Good Progress towards PT goals: Progressing toward goals    Frequency    Min 2X/week      PT Plan      Co-evaluation PT/OT/SLP Co-Evaluation/Treatment: Yes Reason for Co-Treatment: Necessary to address cognition/behavior during functional activity;For patient/therapist safety;To address functional/ADL transfers PT goals addressed during session: Mobility/safety with mobility;Balance;Proper use of DME        AM-PAC PT 6 Clicks Mobility   Outcome Measure  Help needed turning from your back to your side while in a flat bed without using bedrails?: A Lot Help needed moving from lying on your back to sitting on the side of a flat bed without using bedrails?: A Lot Help needed moving to and from a bed to a chair (including a wheelchair)?: A Lot Help needed standing up from a chair using your arms (e.g., wheelchair or bedside chair)?: A Little Help needed to walk in hospital room?: A Little Help needed climbing 3-5 steps with a railing? : Total 6 Click Score: 13    End of Session Equipment Utilized During Treatment: Gait belt Activity Tolerance: Patient limited by fatigue;Patient limited by pain Patient left: in bed;with call bell/phone within reach;with bed alarm set Nurse Communication: Mobility status PT Visit Diagnosis: Unsteadiness on feet (R26.81);Other abnormalities of gait and mobility (R26.89);Muscle weakness (generalized) (M62.81);Difficulty in walking, not elsewhere classified (R26.2);History of falling (Z91.81);Pain Pain - Right/Left: Left Pain - part of body: Hip     Time: 8953-8945 PT Time Calculation (min) (ACUTE ONLY): 8 min  Charges:    $Gait Training: 8-22 mins PT General Charges $$ ACUTE PT VISIT: 1 Visit                     Lucee Brissett, PT, GCS 09/27/24,12:14 PM

## 2024-09-27 NOTE — Discharge Summary (Signed)
 Physician Discharge Summary   Patient: Brian [name] MRN: [id number] DOB: 1941/07/24  Admit date:     09/21/2024  Discharge date: 09/27/24  Discharge Physician: Amaryllis Dare   PCP: [name], MD   Recommendations at discharge:  Please obtain CBC and BMP and follow-up Follow-up with primary care provider  Discharge Diagnoses: Principal Problem:   Acute metabolic encephalopathy Active Problems:   Rhabdomyolysis   UTI (urinary tract infection)   History of stroke   Generalized weakness   Essential hypertension   Fall   HIV (human immunodeficiency virus infection) (HCC)   Left hip pain   Hospital Course: Partly taken from H&P.  Brian [name] is a 83 y.o. 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  Patient notes that he fell the night prior to presentation, he tripped while trying to kill a cockroach.  Family concern of progressive weakness over the past several days.  Poor p.o. intake and worsening confusion.  Baseline urinary incontinence.  On presentation hemodynamically stable, labs with leukocytosis at 16.6, CK 495, CO2 21, lactic acid 2.3, UA concerning for UTI-urine cultures were sent and patient was started on ceftriaxone  based on prior urine cultures.  11/26: Vital stable, improving leukocytosis, slight worsening of CK to 770, preliminary blood cultures negative, urine cultures pending.  PT is recommending SNF.  11/27: Hemodynamically stable, leukocytosis resolved, CK improved to 348.  Urine cultures with multiple species.  Awaiting disposition.  11/28: Vital stable, labs with mild hypokalemia at 3.3 which is being repleted.  Magnesium normal at 2.0.  Antibiotics switched with Ceftin for 2 more days to complete the course.  Pending disposition.  11/29: Remained hemodynamically stable, pending  SNF placement.  11/30: Remained stable, completed the course of antibiotic.  Pending SNF placement.  12/1: Remained hemodynamically stable and is being discharged to SNF for rehab.  Patient was started on low-dose losartan and home metoprolol  dose was decreased to 25 mg daily.  Completed the course of antibiotics for UTI  Patient will continue on current medications and need to have a close follow-up with his providers for further assistance.  Assessment and Plan: * Acute metabolic encephalopathy Resolved. Most likely secondary to UTI as patient has significant pyuria Per family patient noted to be increasingly confused which is not his baseline which normally happens when he has a urinary tract infection. 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 Mentation seems to be at baseline now  Rhabdomyolysis Traumatic and following a fall Total CK 495>>770>>348 Resolved.  UTI (urinary tract infection) Patient with significant pyuria Prior urine culture yielded E. coli and Klebsiella pneumonia both sensitive to cephalosporins, urine cultures with multiple species - Received ceftriaxone  for 3 days followed by Ceftin to complete the course of antibiotic.  Generalized weakness PT and OT are recommending SNF TOC consult  History of stroke History of a prior stroke with residual right upper extremity weakness Continue Plavix  and statin  Fall at home, initial encounter CT hip with a hematoma in the left gluteus maximus muscle, no acute fractures or hip dislocation. -PT and OT -Continue with pain management  Essential hypertension Continue metoprolol , low-dose losartan was added Blood pressure is stable  HIV (human immunodeficiency virus infection) (HCC) Continue HAART  Consultants: None Procedures performed: None Disposition: Skilled nursing  facility Diet recommendation:  Regular diet DISCHARGE MEDICATION: Allergies as of 09/27/2024   No Known Allergies      Medication List     STOP taking these medications    azelastine  0.05 % ophthalmic solution Commonly known as: OPTIVAR    cloNIDine  0.2 MG tablet Commonly known as: CATAPRES    DULoxetine  30 MG capsule Commonly known as: CYMBALTA    pantoprazole  40 MG tablet Commonly known as: PROTONIX    Vitamin D  (Ergocalciferol ) 1.25 MG (50000 UNIT) Caps capsule Commonly known as: DRISDOL        TAKE these medications    alendronate  70 MG tablet Commonly known as: FOSAMAX  Take 70 mg by mouth once a week. Take on Saturday.   clopidogrel  75 MG tablet Commonly known as: PLAVIX  Take 75 mg by mouth daily.   dextromethorphan -guaiFENesin  30-600 MG 12hr tablet Commonly known as: MUCINEX  DM Take 1 tablet by mouth 2 (two) times daily as needed for cough.   dutasteride  0.5 MG capsule Commonly known as: AVODART  Take 1 capsule (0.5 mg total) by mouth daily.   ezetimibe  10 MG tablet Commonly known as: ZETIA  Take 10 mg by mouth daily.   lidocaine  5 % Commonly known as: Lidoderm  Place 1 patch onto the skin daily. Remove & Discard patch within 12 hours or as directed by MD   loratadine  10 MG tablet Commonly known as: CLARITIN  Take 10 mg by mouth daily.   losartan 25 MG tablet Commonly known as: COZAAR Take 1 tablet (25 mg total) by mouth daily.   meloxicam  7.5 MG tablet Commonly known as: Mobic  Take 1 tablet (7.5 mg total) by mouth daily.   metoprolol  succinate 25 MG 24 hr tablet Commonly known as: TOPROL -XL Take 1 tablet (25 mg total) by mouth daily. What changed: how much to take   polyethylene glycol 17 g packet Commonly known as: MIRALAX  / GLYCOLAX  Take 17 g by mouth daily as needed.   simethicone  80 MG chewable tablet Commonly known as: MYLICON Chew 1 tablet (80 mg total) by mouth every 6 (six) hours as needed for flatulence.   tamsulosin  0.4 MG Caps  capsule Commonly known as: FLOMAX  Take 1 capsule (0.4 mg total) by mouth daily.   Triumeq  600-50-300 MG tablet Generic drug: abacavir -dolutegravir -lamiVUDine  Tome 1 tableta por va oral diariamente. (Take 1 tablet by mouth daily.)   Ventolin  HFA 108 (90 Base) MCG/ACT inhaler Generic drug: albuterol  Inhale 2 puffs into the lungs 4 (four) times daily as needed. For shortness of breath and/or wheezing.        Contact information for follow-up providers     [name], MD. Call today.   Specialty: Family Medicine Contact information: [address] [place] [address] [phone]              Contact information for after-discharge care     Destination     [facility] .   Service: Skilled Nursing Contact information: [address] [place]  [address] [phone]                    Discharge Exam: Fredricka Weights   09/21/24 1115  Weight: 59.4 kg   General.  Frail and malnourished elderly man, in no acute distress. Pulmonary.  Lungs clear bilaterally, normal respiratory effort. CV.  Regular rate and rhythm, no JVD, rub or murmur. Abdomen.  Soft, nontender, nondistended, BS positive. CNS.  Alert and oriented .  No focal neurologic deficit. Extremities.  No edema, pulses intact and symmetrical.  Condition at discharge: stable  The results of significant diagnostics from this hospitalization (including imaging, microbiology, ancillary and laboratory) are listed below for reference.   Imaging Studies: CT HIP LEFT WO CONTRAST Result Date: 09/21/2024 EXAM: CT OF THE LEFT HIP WITHOUT IV CONTRAST 09/21/2024 06:43:11 PM TECHNIQUE: CT of the left hip was performed without the administration of intravenous contrast. Multiplanar reformatted images are provided for review. Automated exposure control, iterative reconstruction, and/or weight based adjustment of the mA/kV was utilized to reduce the  radiation dose to as low as reasonably achievable. COMPARISON: Prior study dated 05/12/2021. CLINICAL HISTORY: Fall, left hip pain. FINDINGS: BONES: Mild calcification along the anterior capsular margin/iliofemoral ligament is new from 05/12/2021. However, no underlying fracture is readily apparent. No acute fracture or dislocation. No aggressive appearing osseous abnormality or periostitis. SOFT TISSUE: No significant soft tissue edema. A 10.5 x 3.9 x 2.8 cm hematoma in the left gluteus maximus muscle noted, with an estimated volume of 60 ml. No soft tissue mass. JOINT: No hip effusion noted. No significant degenerative changes. No osseous erosions. INTRAPELVIC CONTENTS: Limited images of the intrapelvic contents demonstrate a diverticulum of the urinary bladder noted eccentric to the left. Prostatomegaly. Penile urethral stent noted. Atherosclerosis. IMPRESSION: 1. 10.5 x 3.9 x 2.8 cm hematoma in the left gluteus maximus muscle. 2. No acute fracture or hip effusion. 3. Mild calcification along the anterior capsular margin/iliofemoral ligament. Electronically signed by: Ryan Salvage MD 09/21/2024 06:54 PM EST RP Workstation: [id number]   DG HIP UNILAT WITH PELVIS 2-3 VIEWS LEFT Result Date: 09/21/2024 CLINICAL DATA:  Left hip pain after falling. EXAM: DG HIP (WITH OR WITHOUT PELVIS) 2-3V LEFT COMPARISON:  Radiographs 07/21/2024 FINDINGS: The bones are demineralized. No evidence of acute fracture, dislocation or femoral head osteonecrosis. Stable mild degenerative changes of both hips and the lower lumbar spine. No acute soft tissue abnormalities are identified. Urethral stent and scattered vascular calcifications are noted. IMPRESSION: No evidence of acute fracture or dislocation. Stable mild degenerative changes. Electronically Signed   By: Elsie Perone M.D.   On: 09/21/2024 12:15   CT Head Wo Contrast Result Date: 09/21/2024 EXAM: CT HEAD WITHOUT 09/21/2024 11:46:49 AM TECHNIQUE: CT of the head  was performed without the administration of intravenous contrast. Automated exposure control, iterative reconstruction, and/or weight based adjustment of the mA/kV was utilized to reduce the radiation dose to as low as reasonably achievable. COMPARISON: 11/27/2023 CLINICAL HISTORY: Head trauma, minor (Age >= 65y) FINDINGS: BRAIN AND VENTRICLES: Proportional prominence of ventricles and sulci, consistent with diffuse cerebral parenchymal volume loss. Periventricular and subcortical white matter hypoattenuation, consistent with moderate chronic ischemic microvascular disease. Chronic lacunar infarcts within bilateral thalami. Chronic lacunar infarct within right cerebellar hemisphere. Calcified atherosclerotic plaque within cavernous/supraclinoid ICA and intradural vertebral arteries. No acute intracranial hemorrhage. No mass effect or midline shift. No extra-axial fluid collection. No evidence of acute infarct. No hydrocephalus. ORBITS: Bilateral lens replacement noted. SINUSES AND MASTOIDS: No acute abnormality. SOFT TISSUES AND SKULL: No acute skull fracture. No acute soft tissue abnormality. IMPRESSION: 1. No acute intracranial abnormality related to head trauma. 2. Diffuse cerebral parenchymal volume loss. 3. Moderate chronic ischemic microvascular disease. 4. Chronic lacunar infarcts within bilateral thalami and right cerebellar hemisphere. Electronically signed by: Donnice Mania MD 09/21/2024 12:00 PM EST RP Workstation: [id number]   CT Cervical Spine Wo Contrast Result Date: 09/21/2024 CLINICAL DATA:  Neck trauma.  Fall last night. EXAM: CT CERVICAL  SPINE WITHOUT CONTRAST TECHNIQUE: Multidetector CT imaging of the cervical spine was performed without intravenous contrast. Multiplanar CT image reconstructions were also generated. RADIATION DOSE REDUCTION: This exam was performed according to the departmental dose-optimization program which includes automated exposure control, adjustment of the mA and/or kV  according to patient size and/or use of iterative reconstruction technique. COMPARISON:  CT cervical spine 11/27/2023. FINDINGS: Alignment: Stable straightening.  No focal angulation or listhesis. Skull base and vertebrae: No evidence of acute cervical spine fracture or traumatic subluxation. Soft tissues and spinal canal: No prevertebral fluid or swelling. No visible canal hematoma. Disc levels: Multilevel spondylosis with disc space narrowing, uncinate spurring and facet hypertrophy most advanced from C3-4 through C6-7. Evidence of chronic interbody and interfacetal ankylosis at C2-3. Grossly stable multilevel foraminal narrowing, greatest on the left at C4-5 and C5-6 and on the right at C5-6 and C6-7. No large disc herniation identified. Upper chest: Emphysematous changes at the lung apices. Left-sided pacing leads noted. Other: Bilateral carotid atherosclerosis. IMPRESSION: 1. No evidence of acute cervical spine fracture, traumatic subluxation or static signs of instability. 2. Stable multilevel cervical spondylosis with associated foraminal narrowing as described. 3.  Emphysema (ICD10-J43.9). Electronically Signed   By: Elsie Perone M.D.   On: 09/21/2024 11:55    Microbiology: Results for orders placed or performed during the hospital encounter of 09/21/24  Urine Culture (for pregnant, neutropenic or urologic patients or patients with an indwelling urinary catheter)     Status: Abnormal   Collection Time: 09/21/24  4:54 PM   Specimen: Urine, Clean Catch  Result Value Ref Range Status   Specimen Description   Final    URINE, CLEAN CATCH Performed at [facility], [address] [place], [place] [address]    Special Requests   Final    NONE Performed at [facility], [address], [place], [place] [address]    Culture MULTIPLE SPECIES PRESENT, SUGGEST RECOLLECTION (A)  Final   Report Status 09/23/2024 FINAL  Final  Blood culture (routine x 2)     Status: None    Collection Time: 09/21/24  6:34 PM   Specimen: BLOOD  Result Value Ref Range Status   Specimen Description BLOOD LEFT ANTECUBITAL  Final   Special Requests   Final    BOTTLES DRAWN AEROBIC AND ANAEROBIC Blood Culture adequate volume   Culture   Final    NO GROWTH 5 DAYS Performed at [facility], [address] [place], [place] [address]    Report Status 09/26/2024 FINAL  Final  Blood culture (routine x 2)     Status: None   Collection Time: 09/21/24  6:34 PM   Specimen: BLOOD  Result Value Ref Range Status   Specimen Description BLOOD BLOOD LEFT FOREARM  Final   Special Requests   Final    BOTTLES DRAWN AEROBIC AND ANAEROBIC Blood Culture results may not be optimal due to an inadequate volume of blood received in culture bottles   Culture   Final    NO GROWTH 5 DAYS Performed at [facility], [address], [place], [place] [address]    Report Status 09/26/2024 FINAL  Final    Labs: CBC: Recent Labs  Lab 09/21/24 1105 09/22/24 0347 09/23/24 0515 09/24/24 0548  WBC 16.6* 10.7* 7.2 6.1  HGB 17.0 13.9 12.8* 11.6*  HCT 50.3 39.5 37.0* 33.1*  MCV 93.8 91.9 92.7 91.4  PLT 282 243 192 208   Basic Metabolic Panel: Recent Labs  Lab  09/21/24 1303 09/22/24 0347 09/23/24 0515 09/24/24 0548  NA 142 141 141 141  K 4.4 4.4 3.7 3.3*  CL 104 108 109 107  CO2 21* 25 22 26   GLUCOSE 109* 104* 89 88  BUN 29* 29* 11 9  CREATININE 1.01 0.98 0.63 0.70  CALCIUM  9.9 8.5* 7.9* 8.2*  MG  --   --   --  2.0   Liver Function Tests: No results for input(s): AST, ALT, ALKPHOS, BILITOT, PROT, ALBUMIN in the last 168 hours. CBG: Recent Labs  Lab 09/23/24 1556  GLUCAP 153*    Discharge time spent: greater than 30 minutes.  This record has been created using Conservation officer, historic buildings. Errors have been sought and corrected,but may not always be located. Such creation errors do not reflect on the standard of care.   Signed: Amaryllis Dare,  MD [facility] 09/27/2024

## 2024-09-27 NOTE — TOC Progression Note (Signed)
 Transition of Care [facility]) - Progression Note    Patient Details  Name: Brian [name] MRN: [id number] Date of Birth: Jun 04, 1941  Transition of Care [employer]) CM/SW Contact  Dalia GORMAN Fuse, RN Phone Number: 09/27/2024, 8:55 AM  Clinical Narrative:    Approved [id number] Dates: 11/30-12/12/2023 Next REview Date: 09/29/2024    Expected Discharge Plan: Skilled Nursing Facility Barriers to Discharge: Continued Medical Work up               Expected Discharge Plan and Services   Discharge Planning Services: CM Consult   Living arrangements for the past 2 months: Apartment                                       Social Drivers of Health (SDOH) Interventions SDOH Screenings   Food Insecurity: Patient Unable To Answer (11/29/2023)  Housing: Patient Unable To Answer (11/29/2023)  Transportation Needs: Patient Unable To Answer (11/29/2023)  Utilities: Patient Unable To Answer (11/29/2023)  Depression (PHQ2-9): Low Risk  (06/25/2022)  Social Connections: Patient Unable To Answer (11/29/2023)  Tobacco Use: Medium Risk (09/21/2024)    Readmission Risk Interventions     No data to display

## 2024-09-27 NOTE — Progress Notes (Signed)
 Occupational Therapy Treatment Patient Details Name: Aryan Sparks MRN: [id number] DOB: [birth year] Today's Date: 09/27/2024   History of present illness Devlyn Retter is a 83 y.o. 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  Reports L hip pain, imaging negative for fracture.   OT comments  Pt agreeable to PT/OT session this date. Utilized interpreter Ami [phone]. He is having 8/10 L hip pain with all movement. Pt performed bed mobility, transfers and ambulation with Mod A x1 for safety, cues for RW management and sequencing with effortful gait noted d/t pain. Pt required supervision for seated grooming tasks at EOB and then wished to return to bed.  Pt left with all needs in place and will cont to require skilled acute OT services to maximize his safety and IND to return to PLOF.       If plan is discharge home, recommend the following:  A lot of help with walking and/or transfers;A lot of help with bathing/dressing/bathroom;Assistance with cooking/housework;Assist for transportation   Equipment Recommendations  Other (comment) (defer)    Recommendations for Other Services      Precautions / Restrictions Precautions Precautions: Fall Recall of Precautions/Restrictions: Intact Precaution/Restrictions Comments: significant pain in L Hip Restrictions Weight Bearing Restrictions Per Provider Order: No       Mobility Bed Mobility Overal bed mobility: Needs Assistance Bed Mobility: Supine to Sit     Supine to sit: Mod assist, HOB elevated, Used rails     General bed mobility comments: cues for sequencing, technique and trunkal elevation and BLE assist d/t L hip pain    Transfers Overall transfer level: Needs assistance Equipment used: Rolling walker (2 wheels) Transfers: Sit to/from Stand Sit to Stand: Mod  assist           General transfer comment: cues for safety, hand placement and sequencing to stand from bed; Min A to ambulate with RW management assist     Balance Overall balance assessment: Needs assistance Sitting-balance support: Feet supported Sitting balance-Leahy Scale: Good     Standing balance support: Bilateral upper extremity supported, During functional activity, Reliant on assistive device for balance Standing balance-Leahy Scale: Poor Standing balance comment: effortful ambulation with RW use and external support required for safety                           ADL either performed or assessed with clinical judgement   ADL Overall ADL's : Needs assistance/impaired     Grooming: Wash/dry face;Oral care;Sitting;Supervision/safety                               Functional mobility during ADLs: Minimal assistance;Cueing for safety;Cueing for sequencing;Rolling walker (2 wheels)      Extremity/Trunk Assessment              Vision       Perception     Praxis     Communication Communication Communication: Impaired Factors Affecting Communication: Non - English speaking, interpreter not available (utilized interpreter Ami [phone])   Cognition Arousal: Alert Behavior During Therapy: Flat affect  Following commands: Impaired Following commands impaired: Follows one step commands with increased time      Cueing   Cueing Techniques: Verbal cues, Gestural cues  Exercises      Shoulder Instructions       General Comments      Pertinent Vitals/ Pain       Pain Assessment Pain Assessment: 0-10 Pain Score: 8  Pain Location: bottom, left hip Pain Descriptors / Indicators: Discomfort, Grimacing, Guarding, Sore Pain Intervention(s): Monitored during session, Patient requesting pain meds-RN notified, Repositioned  Home Living                                           Prior Functioning/Environment              Frequency  Min 2X/week        Progress Toward Goals  OT Goals(current goals can now be found in the care plan section)  Progress towards OT goals: Progressing toward goals  Acute Rehab OT Goals Patient Stated Goal: improve pain OT Goal Formulation: With patient Time For Goal Achievement: 10/06/24 Potential to Achieve Goals: Fair  Plan      Co-evaluation    PT/OT/SLP Co-Evaluation/Treatment: Yes Reason for Co-Treatment: Necessary to address cognition/behavior during functional activity;For patient/therapist safety;To address functional/ADL transfers PT goals addressed during session: Mobility/safety with mobility;Balance;Proper use of DME        AM-PAC OT 6 Clicks Daily Activity     Outcome Measure   Help from another person eating meals?: None Help from another person taking care of personal grooming?: A Little Help from another person toileting, which includes using toliet, bedpan, or urinal?: A Lot Help from another person bathing (including washing, rinsing, drying)?: A Lot Help from another person to put on and taking off regular upper body clothing?: A Little Help from another person to put on and taking off regular lower body clothing?: A Lot 6 Click Score: 16    End of Session Equipment Utilized During Treatment: Gait belt;Rolling walker (2 wheels)  OT Visit Diagnosis: Unsteadiness on feet (R26.81);Repeated falls (R29.6);Muscle weakness (generalized) (M62.81);History of falling (Z91.81)   Activity Tolerance Patient tolerated treatment well   Patient Left in bed;with call bell/phone within reach;with bed alarm set   Nurse Communication Mobility status        Time: 8945-8896 OT Time Calculation (min): 9 min  Charges: OT General Charges $OT Visit: 1 Visit OT Treatments $Self Care/Home Management : 8-22 mins  Lyndi Holbein Chrismon, OTR/L  09/27/24, 12:49 PM   Breauna Mazzeo E Chrismon 09/27/2024, 12:46  PM

## 2024-09-30 ENCOUNTER — Other Ambulatory Visit (HOSPITAL_COMMUNITY): Payer: Self-pay

## 2024-10-04 ENCOUNTER — Other Ambulatory Visit (HOSPITAL_COMMUNITY): Payer: Self-pay

## 2024-10-04 ENCOUNTER — Other Ambulatory Visit: Payer: Self-pay

## 2024-10-04 NOTE — Progress Notes (Signed)
 Specialty Pharmacy Refill Coordination Note  Brian [name] is a 82 y.o. male, patients daughter was contacted today regarding refills of specialty medication(s) Abacavir -Dolutegravir -Lamivud (Triumeq )   Patient requested Delivery   Delivery date: 10/06/24   Verified address: [address]   [place]  [id number]   Medication will be filled on: 10/05/24

## 2024-10-17 ENCOUNTER — Other Ambulatory Visit: Payer: Self-pay

## 2024-10-17 ENCOUNTER — Emergency Department (HOSPITAL_COMMUNITY)

## 2024-10-17 ENCOUNTER — Inpatient Hospital Stay (HOSPITAL_COMMUNITY)
Admission: EM | Admit: 2024-10-17 | Discharge: 2024-10-26 | DRG: 974 | Disposition: A | Discharge status: Skilled Nursing Facility | Source: Skilled Nursing Facility | Attending: Internal Medicine | Admitting: Internal Medicine

## 2024-10-17 ENCOUNTER — Encounter (HOSPITAL_COMMUNITY): Payer: Self-pay | Admitting: *Deleted

## 2024-10-17 DIAGNOSIS — N471 Phimosis: Secondary | ICD-10-CM | POA: Diagnosis present

## 2024-10-17 DIAGNOSIS — I77810 Thoracic aortic ectasia: Secondary | ICD-10-CM | POA: Diagnosis present

## 2024-10-17 DIAGNOSIS — R339 Retention of urine, unspecified: Secondary | ICD-10-CM

## 2024-10-17 DIAGNOSIS — F32A Depression, unspecified: Secondary | ICD-10-CM | POA: Diagnosis present

## 2024-10-17 DIAGNOSIS — Z8249 Family history of ischemic heart disease and other diseases of the circulatory system: Secondary | ICD-10-CM

## 2024-10-17 DIAGNOSIS — R1310 Dysphagia, unspecified: Secondary | ICD-10-CM | POA: Diagnosis present

## 2024-10-17 DIAGNOSIS — E785 Hyperlipidemia, unspecified: Secondary | ICD-10-CM | POA: Diagnosis present

## 2024-10-17 DIAGNOSIS — J690 Pneumonitis due to inhalation of food and vomit: Secondary | ICD-10-CM | POA: Diagnosis present

## 2024-10-17 DIAGNOSIS — J440 Chronic obstructive pulmonary disease with acute lower respiratory infection: Secondary | ICD-10-CM | POA: Diagnosis present

## 2024-10-17 DIAGNOSIS — Y95 Nosocomial condition: Secondary | ICD-10-CM | POA: Diagnosis present

## 2024-10-17 DIAGNOSIS — G9341 Metabolic encephalopathy: Secondary | ICD-10-CM | POA: Diagnosis present

## 2024-10-17 DIAGNOSIS — L8990 Pressure ulcer of unspecified site, unspecified stage: Secondary | ICD-10-CM | POA: Insufficient documentation

## 2024-10-17 DIAGNOSIS — Z87891 Personal history of nicotine dependence: Secondary | ICD-10-CM

## 2024-10-17 DIAGNOSIS — Z7983 Long term (current) use of bisphosphonates: Secondary | ICD-10-CM

## 2024-10-17 DIAGNOSIS — R001 Bradycardia, unspecified: Secondary | ICD-10-CM | POA: Diagnosis present

## 2024-10-17 DIAGNOSIS — D6959 Other secondary thrombocytopenia: Secondary | ICD-10-CM | POA: Diagnosis present

## 2024-10-17 DIAGNOSIS — N179 Acute kidney failure, unspecified: Secondary | ICD-10-CM | POA: Diagnosis present

## 2024-10-17 DIAGNOSIS — I69331 Monoplegia of upper limb following cerebral infarction affecting right dominant side: Secondary | ICD-10-CM

## 2024-10-17 DIAGNOSIS — R338 Other retention of urine: Secondary | ICD-10-CM | POA: Diagnosis present

## 2024-10-17 DIAGNOSIS — R0902 Hypoxemia: Secondary | ICD-10-CM | POA: Diagnosis present

## 2024-10-17 DIAGNOSIS — I10 Essential (primary) hypertension: Secondary | ICD-10-CM | POA: Diagnosis present

## 2024-10-17 DIAGNOSIS — Z515 Encounter for palliative care: Secondary | ICD-10-CM

## 2024-10-17 DIAGNOSIS — E872 Acidosis, unspecified: Secondary | ICD-10-CM | POA: Diagnosis present

## 2024-10-17 DIAGNOSIS — Z7902 Long term (current) use of antithrombotics/antiplatelets: Secondary | ICD-10-CM

## 2024-10-17 DIAGNOSIS — B20 Human immunodeficiency virus [HIV] disease: Secondary | ICD-10-CM | POA: Diagnosis present

## 2024-10-17 DIAGNOSIS — Z66 Do not resuscitate: Secondary | ICD-10-CM | POA: Diagnosis present

## 2024-10-17 DIAGNOSIS — A419 Sepsis, unspecified organism: Secondary | ICD-10-CM | POA: Diagnosis present

## 2024-10-17 DIAGNOSIS — Z1152 Encounter for screening for COVID-19: Secondary | ICD-10-CM

## 2024-10-17 DIAGNOSIS — R54 Age-related physical debility: Secondary | ICD-10-CM | POA: Diagnosis present

## 2024-10-17 DIAGNOSIS — Z791 Long term (current) use of non-steroidal anti-inflammatories (NSAID): Secondary | ICD-10-CM

## 2024-10-17 DIAGNOSIS — J1569 Pneumonia due to other gram-negative bacteria: Secondary | ICD-10-CM | POA: Diagnosis present

## 2024-10-17 DIAGNOSIS — A4181 Sepsis due to Enterococcus: Principal | ICD-10-CM | POA: Diagnosis present

## 2024-10-17 DIAGNOSIS — R6521 Severe sepsis with septic shock: Principal | ICD-10-CM | POA: Diagnosis present

## 2024-10-17 DIAGNOSIS — J189 Pneumonia, unspecified organism: Secondary | ICD-10-CM

## 2024-10-17 DIAGNOSIS — N401 Enlarged prostate with lower urinary tract symptoms: Secondary | ICD-10-CM | POA: Diagnosis present

## 2024-10-17 DIAGNOSIS — Z79899 Other long term (current) drug therapy: Secondary | ICD-10-CM

## 2024-10-17 DIAGNOSIS — L89151 Pressure ulcer of sacral region, stage 1: Secondary | ICD-10-CM | POA: Diagnosis present

## 2024-10-17 DIAGNOSIS — I4510 Unspecified right bundle-branch block: Secondary | ICD-10-CM | POA: Diagnosis present

## 2024-10-17 DIAGNOSIS — Z6820 Body mass index (BMI) 20.0-20.9, adult: Secondary | ICD-10-CM

## 2024-10-17 DIAGNOSIS — R000 Tachycardia, unspecified: Secondary | ICD-10-CM | POA: Diagnosis present

## 2024-10-17 LAB — CBC WITH DIFFERENTIAL/PLATELET
Basophils Absolute: 0.2 K/uL — ABNORMAL HIGH (ref 0.0–0.1)
Basophils Relative: 1 %
Eosinophils Absolute: 0 K/uL (ref 0.0–0.5)
Eosinophils Relative: 0 %
HCT: 41.5 % (ref 39.0–52.0)
Hemoglobin: 13.9 g/dL (ref 13.0–17.0)
Lymphocytes Relative: 4 %
Lymphs Abs: 0.7 K/uL (ref 0.7–4.0)
MCH: 32.4 pg (ref 26.0–34.0)
MCHC: 33.5 g/dL (ref 30.0–36.0)
MCV: 96.7 fL (ref 80.0–100.0)
Monocytes Absolute: 0.4 K/uL (ref 0.1–1.0)
Monocytes Relative: 2 %
Neutro Abs: 17.4 K/uL — ABNORMAL HIGH (ref 1.7–7.7)
Neutrophils Relative %: 93 %
Platelets: 100 K/uL — ABNORMAL LOW (ref 150–400)
RBC: 4.29 MIL/uL (ref 4.22–5.81)
RDW: 14.3 % (ref 11.5–15.5)
WBC: 18.7 K/uL — ABNORMAL HIGH (ref 4.0–10.5)
nRBC: 0 % (ref 0.0–0.2)

## 2024-10-17 LAB — I-STAT CG4 LACTIC ACID, ED
Lactic Acid, Venous: 7.5 mmol/L (ref 0.5–1.9)
Lactic Acid, Venous: 8 mmol/L (ref 0.5–1.9)

## 2024-10-17 LAB — COMPREHENSIVE METABOLIC PANEL WITH GFR
ALT: 21 U/L (ref 0–44)
AST: 86 U/L — ABNORMAL HIGH (ref 15–41)
Albumin: 3.4 g/dL — ABNORMAL LOW (ref 3.5–5.0)
Alkaline Phosphatase: 54 U/L (ref 38–126)
Anion gap: 21 — ABNORMAL HIGH (ref 5–15)
BUN: 48 mg/dL — ABNORMAL HIGH (ref 8–23)
CO2: 17 mmol/L — ABNORMAL LOW (ref 22–32)
Calcium: 9 mg/dL (ref 8.9–10.3)
Chloride: 103 mmol/L (ref 98–111)
Creatinine, Ser: 3.91 mg/dL — ABNORMAL HIGH (ref 0.61–1.24)
GFR, Estimated: 15 mL/min — ABNORMAL LOW
Glucose, Bld: 131 mg/dL — ABNORMAL HIGH (ref 70–99)
Potassium: 4.5 mmol/L (ref 3.5–5.1)
Sodium: 141 mmol/L (ref 135–145)
Total Bilirubin: 1.2 mg/dL (ref 0.0–1.2)
Total Protein: 6.6 g/dL (ref 6.5–8.1)

## 2024-10-17 LAB — PROTIME-INR
INR: 1.4 — ABNORMAL HIGH (ref 0.8–1.2)
Prothrombin Time: 18.1 s — ABNORMAL HIGH (ref 11.4–15.2)

## 2024-10-17 LAB — RESP PANEL BY RT-PCR (RSV, FLU A&B, COVID)  RVPGX2
Influenza A by PCR: NEGATIVE
Influenza B by PCR: NEGATIVE
Resp Syncytial Virus by PCR: NEGATIVE
SARS Coronavirus 2 by RT PCR: NEGATIVE

## 2024-10-17 MED ORDER — LACTATED RINGERS IV BOLUS (SEPSIS)
1000.0000 mL | Freq: Once | INTRAVENOUS | Status: AC
  Administered 2024-10-17: 1000 mL via INTRAVENOUS

## 2024-10-17 MED ORDER — PANTOPRAZOLE SODIUM 40 MG IV SOLR: 40.0000 mg | Freq: Once | INTRAVENOUS | Status: DC

## 2024-10-17 MED ORDER — ACETAMINOPHEN 325 MG PO TABS
650.0000 mg | ORAL_TABLET | Freq: Once | ORAL | Status: AC
  Administered 2024-10-17: 650 mg via ORAL
  Filled 2024-10-17: qty 2

## 2024-10-17 MED ORDER — LACTATED RINGERS IV SOLN: INTRAVENOUS | Status: DC

## 2024-10-17 MED ORDER — VANCOMYCIN HCL IN DEXTROSE 1-5 GM/200ML-% IV SOLN
1000.0000 mg | Freq: Once | INTRAVENOUS | Status: AC
  Administered 2024-10-17: 1000 mg via INTRAVENOUS
  Filled 2024-10-17: qty 200

## 2024-10-17 MED ORDER — SODIUM CHLORIDE 0.9 % IV SOLN
2.0000 g | Freq: Once | INTRAVENOUS | Status: AC
  Administered 2024-10-17: 2 g via INTRAVENOUS
  Filled 2024-10-17: qty 12.5

## 2024-10-17 NOTE — ED Provider Notes (Incomplete)
 I provided a substantive portion of the care of this patient.  I personally made/approved the management plan for this patient and take responsibility for the patient management. {Remember to document shared critical care using "edcritical" dot phrase:1}

## 2024-10-17 NOTE — ED Notes (Addendum)
 Camie Pickett, PA made aware that pt had 769 in bladder, unable to advance urinary catheter at all x 2 RN attempts. Pt only has small dribbles of urine output

## 2024-10-17 NOTE — ED Provider Notes (Signed)
 "  EMERGENCY DEPARTMENT AT [facility] Provider Note   CSN: [id number] Arrival date & time: 10/17/24  2000     Patient presents with: No chief complaint on file.   Brian [name] is a 83 y.o. male.  {Add pertinent medical, surgical, social history, OB history to YEP:67052} Patient with history of HIV on antiretrovirals, MI, asthma, hyperlipidemia, CVA presents today with complaints of fever, shortness of breath, altered mental status.  Patient altered from baseline, Spanish-speaking only, history provided by family at bedside.  Report they saw him yesterday and had dinner with him at 6:00 and he was at his baseline and doing well.  Received a call from the nursing facility this afternoon reporting that they had done an x-ray which showed pneumonia and he was being transported to the hospital.  Level 5 caveat -- altered mental status  The history is provided by the patient. A language interpreter was used (Spanish-speaking only requiring interpreter services).       Prior to Admission medications  Medication Sig Start Date End Date Taking? Authorizing Provider  abacavir -dolutegravir -lamiVUDine  (TRIUMEQ ) 600-50-300 MG tablet Take 1 tablet by mouth daily. 06/25/24   Fayette Bodily, MD  alendronate  (FOSAMAX ) 70 MG tablet Take 70 mg by mouth once a week. Take on Saturday. 07/21/15   [provider]  clopidogrel  (PLAVIX ) 75 MG tablet Take 75 mg by mouth daily. 05/12/23   [provider]  dextromethorphan -guaiFENesin  (MUCINEX  DM) 30-600 MG 12hr tablet Take 1 tablet by mouth 2 (two) times daily as needed for cough. 09/27/24   Caleen Qualia, MD  dutasteride  (AVODART ) 0.5 MG capsule Take 1 capsule (0.5 mg total) by mouth daily. 10/25/20   Francisca Redell BROCKS, MD  ezetimibe  (ZETIA ) 10 MG tablet Take 10 mg by mouth daily. 06/25/22   [provider]  lidocaine  (LIDODERM ) 5 % Place 1 patch onto the skin daily. Remove & Discard patch within 12 hours or as  directed by MD Patient not taking: Reported on 09/21/2024 05/06/24   Reddick, Johnathan B, NP  loratadine  (CLARITIN ) 10 MG tablet Take 10 mg by mouth daily.    [provider]  losartan  (COZAAR ) 25 MG tablet Take 1 tablet (25 mg total) by mouth daily. 09/27/24   Caleen Qualia, MD  meloxicam  (MOBIC ) 7.5 MG tablet Take 1 tablet (7.5 mg total) by mouth daily. 05/06/24   Reddick, Johnathan B, NP  metoprolol  succinate (TOPROL -XL) 25 MG 24 hr tablet Take 1 tablet (25 mg total) by mouth daily. 09/27/24   Amin, Sumayya, MD  polyethylene glycol (MIRALAX  / GLYCOLAX ) 17 g packet Take 17 g by mouth daily as needed. 09/27/24   Amin, Sumayya, MD  simethicone  (MYLICON) 80 MG chewable tablet Chew 1 tablet (80 mg total) by mouth every 6 (six) hours as needed for flatulence. 05/02/22   Raenelle Coria, MD  tamsulosin  (FLOMAX ) 0.4 MG CAPS capsule Take 1 capsule (0.4 mg total) by mouth daily. 10/25/20   Francisca Redell BROCKS, MD  VENTOLIN  HFA 108 (90 BASE) MCG/ACT inhaler Inhale 2 puffs into the lungs 4 (four) times daily as needed. For shortness of breath and/or wheezing. 07/21/15   [provider]    Allergies: Patient has no known allergies.    Review of Systems  Constitutional:  Positive for fever.  Respiratory:  Positive for cough and shortness of breath.   All other systems reviewed and are negative.   Updated Vital Signs BP (!) 84/63   Resp (!) 27   SpO2 92%  Physical Exam Vitals and nursing note reviewed.  Constitutional:      General: He is not in acute distress.    Appearance: Normal appearance. He is normal weight. He is not ill-appearing, toxic-appearing or diaphoretic.  HENT:     Head: Normocephalic and atraumatic.  Cardiovascular:     Rate and Rhythm: Normal rate and regular rhythm.     Heart sounds: Normal heart sounds.  Pulmonary:     Effort: Pulmonary effort is normal. No respiratory distress.     Breath sounds: Rales present.  Abdominal:     General: Abdomen is flat.      Palpations: Abdomen is soft.     Tenderness: There is no abdominal tenderness.  Musculoskeletal:        General: Normal range of motion.     Cervical back: Normal range of motion.     Right lower leg: No edema.     Left lower leg: No edema.  Skin:    General: Skin is warm and dry.  Neurological:     General: No focal deficit present.     Mental Status: He is alert.  Psychiatric:        Mood and Affect: Mood normal.        Behavior: Behavior normal.     (all labs ordered are listed, but only abnormal results are displayed) Labs Reviewed  CBC WITH DIFFERENTIAL/PLATELET - Abnormal; Notable for the following components:      Result Value   WBC 18.7 (*)    Platelets 100 (*)    All other components within normal limits  I-STAT CG4 LACTIC ACID, ED - Abnormal; Notable for the following components:   Lactic Acid, Venous 7.5 (*)    All other components within normal limits  RESP PANEL BY RT-PCR (RSV, FLU A&B, COVID)  RVPGX2  CULTURE, BLOOD (ROUTINE X 2)  CULTURE, BLOOD (ROUTINE X 2)  COMPREHENSIVE METABOLIC PANEL WITH GFR  PROTIME-INR  URINALYSIS, W/ REFLEX TO CULTURE (INFECTION SUSPECTED)    EKG: None  Radiology: No results found.  {Document cardiac monitor, telemetry assessment procedure when appropriate:32947} .Critical Care  Performed by: Nora Lauraine LABOR, PA-C Authorized by: Nessie Nong A, PA-C   Critical care provider statement:    Critical care time (minutes):  35   Critical care was necessary to treat or prevent imminent or life-threatening deterioration of the following conditions:  Sepsis    Medications Ordered in the ED  lactated ringers  infusion (has no administration in time range)  lactated ringers  bolus 1,000 mL (1,000 mLs Intravenous New Bag/Given 10/17/24 2029)    And  lactated ringers  bolus 1,000 mL (has no administration in time range)  vancomycin  (VANCOCIN ) IVPB 1000 mg/200 mL premix (has no administration in time range)  ceFEPIme  (MAXIPIME ) 2 g in  sodium chloride  0.9 % 100 mL IVPB (has no administration in time range)      {Click here for ABCD2, HEART and other calculators REFRESH Note before signing:1}                              Medical Decision Making Amount and/or Complexity of Data Reviewed Labs: ordered. Radiology: ordered.  Risk Prescription drug management.   ***  {Document critical care time when appropriate  Document review of labs and clinical decision tools ie CHADS2VASC2, etc  Document your independent review of radiology images and any outside records  Document your discussion with family members, caretakers and with  consultants  Document social determinants of health affecting pt's care  Document your decision making why or why not admission, treatments were needed:32947:::1}   Final diagnoses:  None    ED Discharge Orders     None        "

## 2024-10-17 NOTE — Progress Notes (Signed)
 Pt being followed by [facility] for Sepsis protocol.

## 2024-10-17 NOTE — ED Triage Notes (Signed)
 From [facility], called out for possible sepsis. He is there from rib fx from fall. Productive cough, worsening SOB, cxr today- showed PNA, started him on levaquin. Staff called out because his vitals were worse. Initial vitals 84/42, hr 120, sats 84% RA, RR 28, cbg 140, etco2 11. HIV +. 500 LR administered, 4 liters oxygen, 130/90, 96% 4 liters.

## 2024-10-17 NOTE — ED Notes (Signed)
ED Provider at bedside to update family 

## 2024-10-17 NOTE — ED Notes (Signed)
ED Provider at bedside speaking with family.  

## 2024-10-18 DIAGNOSIS — Z7983 Long term (current) use of bisphosphonates: Secondary | ICD-10-CM | POA: Diagnosis not present

## 2024-10-18 DIAGNOSIS — Z8679 Personal history of other diseases of the circulatory system: Secondary | ICD-10-CM | POA: Diagnosis not present

## 2024-10-18 DIAGNOSIS — N179 Acute kidney failure, unspecified: Secondary | ICD-10-CM | POA: Diagnosis present

## 2024-10-18 DIAGNOSIS — G9341 Metabolic encephalopathy: Secondary | ICD-10-CM

## 2024-10-18 DIAGNOSIS — N401 Enlarged prostate with lower urinary tract symptoms: Secondary | ICD-10-CM | POA: Diagnosis not present

## 2024-10-18 DIAGNOSIS — R0689 Other abnormalities of breathing: Secondary | ICD-10-CM | POA: Diagnosis not present

## 2024-10-18 DIAGNOSIS — A419 Sepsis, unspecified organism: Secondary | ICD-10-CM | POA: Diagnosis present

## 2024-10-18 DIAGNOSIS — B952 Enterococcus as the cause of diseases classified elsewhere: Secondary | ICD-10-CM | POA: Diagnosis not present

## 2024-10-18 DIAGNOSIS — J189 Pneumonia, unspecified organism: Secondary | ICD-10-CM | POA: Diagnosis present

## 2024-10-18 DIAGNOSIS — E872 Acidosis, unspecified: Secondary | ICD-10-CM

## 2024-10-18 DIAGNOSIS — D6959 Other secondary thrombocytopenia: Secondary | ICD-10-CM | POA: Diagnosis present

## 2024-10-18 DIAGNOSIS — J168 Pneumonia due to other specified infectious organisms: Secondary | ICD-10-CM | POA: Diagnosis not present

## 2024-10-18 DIAGNOSIS — R6521 Severe sepsis with septic shock: Secondary | ICD-10-CM | POA: Diagnosis present

## 2024-10-18 DIAGNOSIS — R1310 Dysphagia, unspecified: Secondary | ICD-10-CM | POA: Diagnosis present

## 2024-10-18 DIAGNOSIS — R6520 Severe sepsis without septic shock: Secondary | ICD-10-CM | POA: Diagnosis present

## 2024-10-18 DIAGNOSIS — Z87891 Personal history of nicotine dependence: Secondary | ICD-10-CM | POA: Diagnosis not present

## 2024-10-18 DIAGNOSIS — I10 Essential (primary) hypertension: Secondary | ICD-10-CM | POA: Diagnosis present

## 2024-10-18 DIAGNOSIS — Z8673 Personal history of transient ischemic attack (TIA), and cerebral infarction without residual deficits: Secondary | ICD-10-CM

## 2024-10-18 DIAGNOSIS — E785 Hyperlipidemia, unspecified: Secondary | ICD-10-CM | POA: Diagnosis present

## 2024-10-18 DIAGNOSIS — B20 Human immunodeficiency virus [HIV] disease: Secondary | ICD-10-CM | POA: Diagnosis present

## 2024-10-18 DIAGNOSIS — L8990 Pressure ulcer of unspecified site, unspecified stage: Secondary | ICD-10-CM | POA: Insufficient documentation

## 2024-10-18 DIAGNOSIS — Z66 Do not resuscitate: Secondary | ICD-10-CM | POA: Diagnosis present

## 2024-10-18 DIAGNOSIS — Z7401 Bed confinement status: Secondary | ICD-10-CM | POA: Diagnosis not present

## 2024-10-18 DIAGNOSIS — R404 Transient alteration of awareness: Secondary | ICD-10-CM | POA: Diagnosis not present

## 2024-10-18 DIAGNOSIS — J690 Pneumonitis due to inhalation of food and vomit: Secondary | ICD-10-CM | POA: Diagnosis present

## 2024-10-18 DIAGNOSIS — I959 Hypotension, unspecified: Secondary | ICD-10-CM | POA: Diagnosis not present

## 2024-10-18 DIAGNOSIS — Z1152 Encounter for screening for COVID-19: Secondary | ICD-10-CM | POA: Diagnosis not present

## 2024-10-18 DIAGNOSIS — Y95 Nosocomial condition: Secondary | ICD-10-CM | POA: Diagnosis present

## 2024-10-18 DIAGNOSIS — J1569 Pneumonia due to other gram-negative bacteria: Secondary | ICD-10-CM | POA: Diagnosis present

## 2024-10-18 DIAGNOSIS — Z7902 Long term (current) use of antithrombotics/antiplatelets: Secondary | ICD-10-CM | POA: Diagnosis not present

## 2024-10-18 DIAGNOSIS — R339 Retention of urine, unspecified: Secondary | ICD-10-CM | POA: Diagnosis present

## 2024-10-18 DIAGNOSIS — I77810 Thoracic aortic ectasia: Secondary | ICD-10-CM | POA: Diagnosis present

## 2024-10-18 DIAGNOSIS — R7881 Bacteremia: Secondary | ICD-10-CM | POA: Diagnosis not present

## 2024-10-18 DIAGNOSIS — Z515 Encounter for palliative care: Secondary | ICD-10-CM | POA: Diagnosis not present

## 2024-10-18 DIAGNOSIS — L89151 Pressure ulcer of sacral region, stage 1: Secondary | ICD-10-CM | POA: Diagnosis present

## 2024-10-18 DIAGNOSIS — I69331 Monoplegia of upper limb following cerebral infarction affecting right dominant side: Secondary | ICD-10-CM | POA: Diagnosis not present

## 2024-10-18 DIAGNOSIS — J440 Chronic obstructive pulmonary disease with acute lower respiratory infection: Secondary | ICD-10-CM | POA: Diagnosis present

## 2024-10-18 DIAGNOSIS — Z743 Need for continuous supervision: Secondary | ICD-10-CM | POA: Diagnosis not present

## 2024-10-18 DIAGNOSIS — R338 Other retention of urine: Secondary | ICD-10-CM | POA: Diagnosis present

## 2024-10-18 DIAGNOSIS — A4181 Sepsis due to Enterococcus: Secondary | ICD-10-CM | POA: Diagnosis present

## 2024-10-18 DIAGNOSIS — F32A Depression, unspecified: Secondary | ICD-10-CM | POA: Diagnosis present

## 2024-10-18 DIAGNOSIS — R6889 Other general symptoms and signs: Secondary | ICD-10-CM | POA: Diagnosis not present

## 2024-10-18 DIAGNOSIS — R54 Age-related physical debility: Secondary | ICD-10-CM | POA: Diagnosis present

## 2024-10-18 DIAGNOSIS — J449 Chronic obstructive pulmonary disease, unspecified: Secondary | ICD-10-CM

## 2024-10-18 LAB — RESPIRATORY PANEL BY PCR

## 2024-10-18 LAB — URINALYSIS, W/ REFLEX TO CULTURE (INFECTION SUSPECTED)
Bilirubin Urine: NEGATIVE
Glucose, UA: NEGATIVE mg/dL
Ketones, ur: NEGATIVE mg/dL
Nitrite: NEGATIVE
Protein, ur: NEGATIVE mg/dL
RBC / HPF: >50 RBC/hpf (ref 0–5)
Specific Gravity, Urine: 1.01 (ref 1.005–1.030)
pH: 6.5 (ref 5.0–8.0)

## 2024-10-18 LAB — BASIC METABOLIC PANEL WITH GFR
Anion gap: 17 — ABNORMAL HIGH (ref 5–15)
BUN: 47 mg/dL — ABNORMAL HIGH (ref 8–23)
CO2: 17 mmol/L — ABNORMAL LOW (ref 22–32)
Calcium: 8.3 mg/dL — ABNORMAL LOW (ref 8.9–10.3)
Chloride: 105 mmol/L (ref 98–111)
Creatinine, Ser: 3.29 mg/dL — ABNORMAL HIGH (ref 0.61–1.24)
GFR, Estimated: 18 mL/min — ABNORMAL LOW
Glucose, Bld: 99 mg/dL (ref 70–99)
Potassium: 4.3 mmol/L (ref 3.5–5.1)
Sodium: 138 mmol/L (ref 135–145)

## 2024-10-18 LAB — GLUCOSE, CAPILLARY
Glucose-Capillary: 111 mg/dL — ABNORMAL HIGH (ref 70–99)
Glucose-Capillary: 96 mg/dL (ref 70–99)
Glucose-Capillary: 87 mg/dL (ref 70–99)
Glucose-Capillary: 91 mg/dL (ref 70–99)
Glucose-Capillary: 74 mg/dL (ref 70–99)
Glucose-Capillary: 79 mg/dL (ref 70–99)

## 2024-10-18 LAB — CBC
HCT: 34.7 % — ABNORMAL LOW (ref 39.0–52.0)
Hemoglobin: 12 g/dL — ABNORMAL LOW (ref 13.0–17.0)
MCH: 32.9 pg (ref 26.0–34.0)
MCHC: 34.6 g/dL (ref 30.0–36.0)
MCV: 95.1 fL (ref 80.0–100.0)
Platelets: 78 K/uL — ABNORMAL LOW (ref 150–400)
RBC: 3.65 MIL/uL — ABNORMAL LOW (ref 4.22–5.81)
RDW: 14 % (ref 11.5–15.5)
WBC: 15.2 K/uL — ABNORMAL HIGH (ref 4.0–10.5)
nRBC: 0 % (ref 0.0–0.2)

## 2024-10-18 LAB — LACTIC ACID, PLASMA
Lactic Acid, Venous: 4.7 mmol/L (ref 0.5–1.9)
Lactic Acid, Venous: 2.7 mmol/L (ref 0.5–1.9)
Lactic Acid, Venous: 1.6 mmol/L (ref 0.5–1.9)

## 2024-10-18 LAB — T-HELPER CELLS (CD4) COUNT (NOT AT ARMC)
CD4 % Helper T Cell: 33 % (ref 33–65)
CD4 T Cell Abs: 270 /uL — ABNORMAL LOW (ref 400–1790)

## 2024-10-18 LAB — MAGNESIUM: Magnesium: 1.4 mg/dL — ABNORMAL LOW (ref 1.7–2.4)

## 2024-10-18 LAB — MRSA NEXT GEN BY PCR, NASAL: MRSA by PCR Next Gen: NOT DETECTED

## 2024-10-18 LAB — PHOSPHORUS: Phosphorus: 4.6 mg/dL (ref 2.5–4.6)

## 2024-10-18 LAB — CBG MONITORING, ED: Glucose-Capillary: 88 mg/dL (ref 70–99)

## 2024-10-18 MED ORDER — SODIUM CHLORIDE 3 % IN NEBU
4.0000 mL | INHALATION_SOLUTION | Freq: Two times a day (BID) | RESPIRATORY_TRACT | Status: AC
  Administered 2024-10-18 – 2024-10-20 (×5): 4 mL via RESPIRATORY_TRACT
  Filled 2024-10-18 (×5): qty 4

## 2024-10-18 MED ORDER — LACTATED RINGERS IV SOLN: INTRAVENOUS | Status: AC

## 2024-10-18 MED ORDER — REVEFENACIN 175 MCG/3ML IN SOLN
175.0000 ug | Freq: Every day | RESPIRATORY_TRACT | Status: DC
  Administered 2024-10-18 – 2024-10-26 (×7): 175 ug via RESPIRATORY_TRACT
  Filled 2024-10-18 (×8): qty 3

## 2024-10-18 MED ORDER — HEPARIN SODIUM (PORCINE) 5000 UNIT/ML IJ SOLN
5000.0000 [IU] | Freq: Three times a day (TID) | INTRAMUSCULAR | Status: DC
  Administered 2024-10-18 – 2024-10-26 (×25): 5000 [IU] via SUBCUTANEOUS
  Filled 2024-10-18 (×24): qty 1

## 2024-10-18 MED ORDER — SODIUM CHLORIDE 0.9 % IV SOLN
2.0000 g | INTRAVENOUS | Status: DC
  Administered 2024-10-18 – 2024-10-19 (×2): 2 g via INTRAVENOUS
  Filled 2024-10-18 (×2): qty 12.5

## 2024-10-18 MED ORDER — CHLORHEXIDINE GLUCONATE CLOTH 2 % EX PADS
6.0000 | MEDICATED_PAD | Freq: Every day | CUTANEOUS | Status: DC
  Administered 2024-10-18 – 2024-10-26 (×7): 6 via TOPICAL

## 2024-10-18 MED ORDER — LACTATED RINGERS IV BOLUS
250.0000 mL | Freq: Once | INTRAVENOUS | Status: AC
  Administered 2024-10-18: 250 mL via INTRAVENOUS

## 2024-10-18 MED ORDER — INSULIN ASPART 100 UNIT/ML IJ SOLN
0.0000 [IU] | INTRAMUSCULAR | Status: DC
  Administered 2024-10-19 – 2024-10-25 (×4): 1 [IU] via SUBCUTANEOUS
  Filled 2024-10-18 (×4): qty 1

## 2024-10-18 MED ORDER — ARFORMOTEROL TARTRATE 15 MCG/2ML IN NEBU
15.0000 ug | INHALATION_SOLUTION | Freq: Two times a day (BID) | RESPIRATORY_TRACT | Status: DC
  Administered 2024-10-18 – 2024-10-26 (×16): 15 ug via RESPIRATORY_TRACT
  Filled 2024-10-18 (×15): qty 2

## 2024-10-18 MED ORDER — IPRATROPIUM-ALBUTEROL 0.5-2.5 (3) MG/3ML IN SOLN: 3.0000 mL | Freq: Four times a day (QID) | RESPIRATORY_TRACT | Status: DC | PRN

## 2024-10-18 MED ORDER — ONDANSETRON HCL 4 MG/2ML IJ SOLN: 4.0000 mg | Freq: Four times a day (QID) | INTRAMUSCULAR | Status: DC | PRN

## 2024-10-18 MED ORDER — MAGNESIUM SULFATE 4 GM/100ML IV SOLN
4.0000 g | Freq: Once | INTRAVENOUS | Status: AC
  Administered 2024-10-18: 4 g via INTRAVENOUS
  Filled 2024-10-18: qty 100

## 2024-10-18 MED ORDER — NOREPINEPHRINE 4 MG/250ML-% IV SOLN
0.0000 ug/min | INTRAVENOUS | Status: DC
  Administered 2024-10-18: 2 ug/min via INTRAVENOUS
  Filled 2024-10-18: qty 250

## 2024-10-18 MED ORDER — BUDESONIDE 0.25 MG/2ML IN SUSP
0.2500 mg | Freq: Two times a day (BID) | RESPIRATORY_TRACT | Status: DC
  Administered 2024-10-18 – 2024-10-26 (×16): 0.25 mg via RESPIRATORY_TRACT
  Filled 2024-10-18 (×15): qty 2

## 2024-10-18 MED ORDER — LACTATED RINGERS IV BOLUS
500.0000 mL | Freq: Once | INTRAVENOUS | Status: AC
  Administered 2024-10-18: 500 mL via INTRAVENOUS

## 2024-10-18 MED ORDER — SODIUM CHLORIDE 0.9 % IV SOLN: INTRAVENOUS | Status: AC | PRN

## 2024-10-18 MED ORDER — CHLORHEXIDINE GLUCONATE CLOTH 2 % EX PADS: 6.0000 | MEDICATED_PAD | Freq: Every day | CUTANEOUS | Status: DC

## 2024-10-18 MED ORDER — MAGNESIUM SULFATE 4 GM/100ML IV SOLN: 4.0000 g | Freq: Once | INTRAVENOUS | Status: DC

## 2024-10-18 MED ORDER — VANCOMYCIN VARIABLE DOSE PER UNSTABLE RENAL FUNCTION (PHARMACIST DOSING): Status: DC

## 2024-10-18 NOTE — ED Notes (Signed)
Urology Provider at bedside. 

## 2024-10-18 NOTE — ED Notes (Signed)
CCM Provider at bedside. ?

## 2024-10-18 NOTE — Progress Notes (Signed)
 [facility] Physician-Brief Progress Note Patient Name: Brian [name] DOB: June 27, 1941 MRN: [id number]   Date of Service  10/18/2024  HPI/Events of Note  83 year old male with a history of HIV, CVA with right upper extremity weakness, COPD who presents with productive cough and dyspnea with severe sepsis secondary to pneumonia and obstructive AKI requiring urology guided Foley catheter placement.  Vital signs, results, and imaging reviewed.  eICU Interventions  Unclear goals of care, currently receiving full spectrum of care but also ordered for comfort care only. Empiric antibiotics, crystalloid infusion, vasopressors as needed for MAP greater than 65. Electrolyte supplementation DVT prophylaxis with heparin  GI prophylaxis not indicated     Intervention Category Evaluation Type: New Patient Evaluation  [name] 10/18/2024, 3:31 AM

## 2024-10-18 NOTE — Progress Notes (Addendum)
 Pharmacy Antibiotic Note  Brian [name] is a 83 y.o. male admitted on 10/17/2024 with productive cough/SOB.  Pharmacy has been consulted for cefepime /vancomycin  dosing for sepsis   -WBC 18.7, Tmax 100.7, sCr 3.91 (bl~0.7-0.9) -CXR: LL pneumonia -Blood cultures collected/MRSA PCR ordered -Flu/COVID negative -10/2023 Ucx: E. Coli (S-cephalosporins); kleb pneumo (R-ampicillin )  Plan: -Cefepime  2g IV every 24 hours -Vancomycin  1g IV x1 -Vancomycin  variable given AKI -Monitor renal function for antibiotic dose adjustments  -Follow up signs of clinical improvement, LOT, de-escalation of antibiotics    Temp (24hrs), Avg:100.7 F (38.2 C), Min:100.7 F (38.2 C), Max:100.7 F (38.2 C)  Recent Labs  Lab 10/17/24 2009 10/17/24 2024 10/17/24 2222  WBC 18.7*  --   --   CREATININE 3.91*  --   --   LATICACIDVEN  --  7.5* 8.0*    CrCl cannot be calculated (Unknown ideal weight.).    Allergies[1]  Antimicrobials this admission: Cefepime  12/22 >>  Vancomycin  12/22 >>   Microbiology results: 12/22 BCx:   12/22 MRSA PCR:   Thank you for allowing pharmacy to be a part of this patients care.  Lynwood Poplar, PharmD, BCPS Clinical Pharmacist 10/18/2024 12:55 AM       [1] No Known Allergies

## 2024-10-18 NOTE — IPAL (Addendum)
 I spoke with son Brian [name], Brian [name]. He tells me his father wishes is DNR/DNI, and let him go peacefully. Son reported ok to receive antibiotics, however if he needs life support medication as pressors, or worsening of his breathing, patient would like to be transition to comfort care. I explained what comfort means, like avoid aggressive measurements, and focus on treating his symptoms, and let him go peacefully. I mentioned that if he deteriorates I will call the him (son) to transition to comfort. Currently he does not need any pressors.   All his questions were answered.  Marny Patch,  MD

## 2024-10-18 NOTE — Plan of Care (Signed)

## 2024-10-18 NOTE — Progress Notes (Addendum)
 PCCM Interval Progress Note  Briefly, Brian [name] is a 83 year old spanish speaking male who presented from SNF with septic shock due to pneumonia.   Vitals:   10/18/24 0800 10/18/24 0900  BP: (!) 107/48 (!) 114/53  Pulse: 78 76  Resp: 16 (!) 26  Temp: (!) 97.2 F (36.2 C) (!) 97.2 F (36.2 C)  SpO2: 100% 97%   Physical Exam: General: acute on chronically ill appearing elderly male, resting in bed HEENT: [place]/AT, sclera anicteric, dry mucous membranes  Neuro: spanish speaking only, oriented to person, moves all extremities spontaneously Pulm: tachypneic, labored on nasal cannula, rhonchi throughout  CV: regular rate and rhythm, normal S1 and S2, no m/r/g GI: mildly tender, no rebound or guarding, +BS Extremities: warm, dry, no edema  Labs/imaging reviewed   Assessment/Plan:   Septic shock due to pneumonia, HCAP versus aspiration Lactic acidosis  Acute metabolic encephalopathy  Flu/COVID negative. CXR with left sided infiltrates. Off levophed .  - Send sputum culture - Send extended respiratory viral panel  - Follow-up MRSA nares, continue cefepime   - Follow-up blood cultures  - Continue IVF resuscitation - Continue to trend lactic acid  - Holding home medications given history of dysphagia and possible aspiration. Patient's MOST form indicates that he would not want feeding tube.   Respiratory insufficiency History of COPD - Wean oxygen as able to maintain SpO2>92% - Add ICS/LAMA/LABA - 3% nebs - Chest PT  AKI Acute urinary retention, urology placed foley in ED - Continue foley, TOV when able  - Holding home flomax  given hypotension   History of HIV  - Holding home ART while unable to take PO - Check CD4 count   Goals of care MOST form endorses DNR with comfort measures, accepts antibiotics if indicated and IVF. No feeding tube. Discussed with son, Brian [name], who affirms above. He states that they are also ok with IV medicine to support his blood pressure  if needed. I explained that he is working hard to breathe and I worry he may tire out at which time to avoid suffering would recommend transition to full comfort since he would not want the breathing tube. Son agrees to continue current care for now, code status changed to DNR/DNI from comfort. - Will consult palliative care    Rexene LOISE Tanda DEVONNA [facility] 10/18/2024 9:26 AM  Please see [email or web address] for pager details.  From 7A-7P if no response, please call [phone] After hours, please call [facility] [phone]

## 2024-10-18 NOTE — Procedures (Signed)
" ° °  Procedure: insert indwelling catheter bedside   Urology Procedure Note: The patient was prepped and draped in the usual sterile fashion. He was noted to have phimosis. I was unable to pass a 16 Fr foley due to resistance at the meatus. I then passed a hydrophilic glidewire into the patient's bladder per urethra and confirmed placement with a 5 Fr open-ended catheter with return of urine aspirate. I replaced the wire and sequentially dilated the urethral meatus to 16 Fr using Heyman Bard dilators. I then was able to pass a 14 Fr coude catheter into the bladder with return of clear urine.   The patient tolerated the procedure.  - Foley to remain for at least 7 days given dilation of the urethral meatus - Continue meatal care with catheter in place   Maurilio Agar, MD  Urology PGY-4  "

## 2024-10-18 NOTE — Evaluation (Signed)
 Clinical/Bedside Swallow Evaluation Patient Details  Name: Brian [name] MRN: [id number] Date of Birth: Jun 11, 1941  Today's Date: 10/18/2024 Time: SLP Start Time (ACUTE ONLY): 1444 SLP Stop Time (ACUTE ONLY): 1506 SLP Time Calculation (min) (ACUTE ONLY): 22 min  Past Medical History:  Past Medical History:  Diagnosis Date   Arthritis    Asthma    Bradycardia    Depression    Enlarged prostate    HIV (human immunodeficiency virus infection) (HCC)    Hyperlipidemia    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   Stroke [facility])    Past Surgical History:  Past Surgical History:  Procedure Laterality Date   cataracts     EYE SURGERY     PACEMAKER INSERTION N/A 08/02/2015   Procedure: INSERTION PACEMAKER;  Surgeon: Sheralyn Flock, MD;  Location: [name];  Service: Cardiovascular;  Laterality: N/A;   HPI:  MBS in 2023 reported trace silent aspiration of nectar thick and thin liquids which was eliminated when patient taking small sips.    Assessment / Plan / Recommendation  Clinical Impression  NPO pending MBS next date(schedule permitting). He may have necessary meds and sips of water with meds. Patient presents with clinical s/s of dysphagia as per this bedside swallow evaluation. He was awake, alert, pleasant and able to follow directions but was only oriented to self. He had one cough productive of small amount of light greenish sputum following oral care. SLP asssessed his swallow via PO's of thin liquids and puree solids. Patient did not exhibit any overt s/s during PO intake but exhibited slightly delayed coughing which sounded congested but was non-productive. No change in voice or vitals observed. As patient has a h/o dysphagia, appears with impaired cognition and is currently admitted with PNA, recommend continue NPO.  SLP Visit Diagnosis: Dysphagia, unspecified (R13.10)    Aspiration Risk  Moderate aspiration risk    Diet Recommendation NPO except meds;Other (Comment) (sips of water with  meds)    Liquid Administration via: Cup Medication Administration: Other (Comment) (as tolerated)    Other Recommendations Oral Care Recommendations: Oral care prior to ice chip/H20;Oral care BID;Oral care before and after PO     Swallow Evaluation Recommendations     Assistance Recommended at Discharge    Functional Status Assessment Patient has had a recent decline in their functional status and demonstrates the ability to make significant improvements in function in a reasonable and predictable amount of time.  Frequency and Duration min 2x/week  1 week       Prognosis Prognosis for improved oropharyngeal function: Good Barriers to Reach Goals: Cognitive deficits      Swallow Study   General Date of Onset: 10/18/24 HPI: MBS in 2023 reported trace silent aspiration of nectar thick and thin liquids which was eliminated when patient taking small sips. Type of Study: Bedside Swallow Evaluation Previous Swallow Assessment: 2023 MBS Diet Prior to this Study: NPO Temperature Spikes Noted: No Respiratory Status: Room air History of Recent Intubation: No Behavior/Cognition: Alert;Cooperative;Pleasant mood Oral Cavity Assessment: Dry;Dried secretions Oral Care Completed by SLP: Yes Oral Cavity - Dentition: Poor condition Vision: Functional for self-feeding Self-Feeding Abilities: Able to feed self Patient Positioning: Upright in bed Baseline Vocal Quality: Normal Volitional Cough: Strong;Congested    Oral/Motor/Sensory Function Overall Oral Motor/Sensory Function: Mild impairment Facial ROM: Reduced left Facial Symmetry: Abnormal symmetry left Facial Strength: Reduced left   Ice Chips     Thin Liquid Thin Liquid: Impaired Presentation: Cup;Self Fed Pharyngeal  Phase Impairments: Cough - Delayed  Nectar Thick     Honey Thick     Puree Puree: Within functional limits Presentation: Self Fed;Spoon   Solid     Solid: Not tested     Norleen IVAR Blase, MA, CCC-SLP Speech  Therapy  10/18/2024,3:36 PM

## 2024-10-18 NOTE — H&P (Addendum)
 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  Initially hypoxic in ED now 99% on RA.  He received 2 liters IV fluids and remains hypotensive and now on 3rd LR liter.   Family ok for IV vasopressors. Urology to come to place urine cath, as multiple attempts in ED failed and he has 769 in the bladder. Cr 3.91, LA 8, wbc 18.7, CXR left lower low and left middle area infiltrates. 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 rate (!) 28, SpO2 100%.       No intake or output data in the 24 hours ending 10/18/24 0029 There were no vitals filed for this visit.  Examination: General: elderly male in NAD on RA, does not know where he is, oriented to self only HENT: gurgling of secretions  Lungs: few b/l rhonchi, diminished bs b/l Cardiovascular: re s1s2 no murmurs Abdomen: soft nt nd bs pos no guarding Extremities: no cyanosis, clubbing or edema Neuro: confused, mumbling in spanish GU: Foley attempted  Resolved problem list   Assessment and Plan  Sepsis From pneumonia Lactic acidosis from Sepsis, will trend Pneumonia Healthcare associated vs aspiration given his swallow abnormalities Broad spectrum antibiotics Hypotension IV fluids Starting  vasopressors AKI Trend Cr Monitor Is/Os Foley to be placed HIV Check CD4 count Resume HAART H/o CVA Supportive care H/o HTN-currently hypotensive COPD by history not in exacerbation   Labs   CBC: Recent Labs  Lab 10/17/24 2009  WBC 18.7*  NEUTROABS 17.4*  HGB 13.9  HCT 41.5  MCV 96.7  PLT 100*    Basic Metabolic Panel: Recent Labs  Lab 10/17/24 2009  NA 141  K 4.5  CL 103  CO2 17*  GLUCOSE 131*  BUN 48*  CREATININE 3.91*  CALCIUM  9.0   GFR: CrCl cannot be calculated (Unknown ideal weight.). Recent Labs  Lab 10/17/24 2009 10/17/24 2024 10/17/24 2222  WBC 18.7*  --   --   LATICACIDVEN  --  7.5* 8.0*    Liver Function Tests: Recent Labs  Lab 10/17/24 2009  AST 86*  ALT 21  ALKPHOS 54  BILITOT 1.2  PROT 6.6  ALBUMIN 3.4*   No results for input(s): LIPASE, AMYLASE in the last 168 hours. No results for input(s): AMMONIA in the last 168 hours.  ABG No results found for: PHART, PCO2ART, PO2ART, HCO3, TCO2, ACIDBASEDEF, O2SAT   Coagulation Profile: Recent Labs  Lab 10/17/24 2009  INR 1.4*    Cardiac Enzymes: No results for input(s): CKTOTAL, CKMB, CKMBINDEX, TROPONINI in the last 168 hours.  HbA1C: Hgb A1c MFr Bld  Date/Time Value Ref Range Status  07/17/2023 03:59 AM 6.1 (H) 4.8 - 5.6 % Final    Comment:    (NOTE)  Prediabetes: 5.7 - 6.4         Diabetes: >6.4         Glycemic control for adults with diabetes: <7.0   04/25/2022 07:39 AM 5.8 (H) 4.8 - 5.6 % Final    Comment:    (NOTE)         Prediabetes: 5.7 - 6.4         Diabetes: >6.4         Glycemic control for adults with diabetes: <7.0     CBG: No results for input(s): GLUCAP in the last 168 hours.  Review of Systems:   confused  Past Medical History:  He,  has a past medical history of Arthritis, Asthma, Bradycardia, Depression, Enlarged prostate, HIV (human immunodeficiency virus infection) (HCC), Hyperlipidemia, Myocardial  injury (07/17/2023), and Stroke (HCC).   Surgical History:   Past Surgical History:  Procedure Laterality Date   cataracts     EYE SURGERY     PACEMAKER INSERTION N/A 08/02/2015   Procedure: INSERTION PACEMAKER;  Surgeon: Sheralyn Flock, MD;  Location: [name];  Service: Cardiovascular;  Laterality: N/A;     Social History:   reports that he has quit smoking. His smoking use included cigarettes. He has never used smokeless tobacco. He reports current alcohol  use of about 4.0 standard drinks of alcohol  per week. He reports that he does not use drugs.   Family History:  His family history includes Heart disease in his brother; Hypertension in an other family member.   Allergies Allergies[1]   Home Medications  Prior to Admission medications  Medication Sig Start Date End Date Taking? Authorizing Provider  abacavir -dolutegravir -lamiVUDine  (TRIUMEQ ) 600-50-300 MG tablet Take 1 tablet by mouth daily. 06/25/24  Yes Fayette Bodily, MD  alendronate  (FOSAMAX ) 70 MG tablet Take 70 mg by mouth once a week. Take on Saturday. 07/21/15  Yes [provider]  clopidogrel  (PLAVIX ) 75 MG tablet Take 75 mg by mouth daily. 05/12/23  Yes [provider]  dextromethorphan -guaiFENesin  (MUCINEX  DM) 30-600 MG 12hr tablet Take 1 tablet by mouth 2 (two) times daily as needed for cough. 09/27/24  Yes Caleen Qualia, MD  dutasteride  (AVODART ) 0.5 MG capsule Take 1 capsule (0.5 mg total) by mouth daily. 10/25/20  Yes Francisca Redell BROCKS, MD  ezetimibe  (ZETIA ) 10 MG tablet Take 10 mg by mouth daily. 06/25/22  Yes [provider]  lidocaine  4 % Place 1 patch onto the skin daily.   Yes [provider]  loratadine  (CLARITIN ) 10 MG tablet Take 10 mg by mouth daily.   Yes [provider]  losartan  (COZAAR ) 25 MG tablet Take 1 tablet (25 mg total) by mouth daily. 09/27/24  Yes Caleen Qualia, MD  meloxicam  (MOBIC ) 7.5 MG tablet Take 1 tablet (7.5 mg total) by mouth daily. 05/06/24   Yes Reddick, Johnathan B, NP  metoprolol  succinate (TOPROL -XL) 25 MG 24 hr tablet Take 1 tablet (25 mg total) by mouth daily. 09/27/24  Yes Amin, Sumayya, MD  polyethylene glycol (MIRALAX  / GLYCOLAX ) 17 g packet Take 17 g by mouth daily as needed. 09/27/24  Yes Caleen Qualia, MD  simethicone  (MYLICON) 80 MG chewable tablet Chew 1 tablet (80 mg total) by mouth every 6 (six) hours as needed for flatulence. 05/02/22  Yes Raenelle Coria, MD  tamsulosin  (FLOMAX ) 0.4 MG CAPS capsule Take 1 capsule (0.4 mg total) by mouth daily. 10/25/20  Yes Francisca Redell BROCKS, MD  VENTOLIN  HFA 108 (90 BASE) MCG/ACT inhaler Inhale 2 puffs into the lungs 4 (  four) times daily as needed. For shortness of breath and/or wheezing. 07/21/15  Yes [provider]     Critical care time: 59   The patient is critically ill with multiple organ system failure and requires high complexity decision making for assessment and support, frequent evaluation and titration of therapies, advanced monitoring, review of radiographic studies and interpretation of complex data.   Critical Care Time devoted to patient care services, exclusive of separately billable procedures, described in this note is 35 minutes.   Orlin Fairly, MD [facility] See Amion for pager  If no response to pager , please call [phone] until 7pm After 7:00 pm call Elink  [phone] 10/18/2024, 12:29 AM             [1] No Known Allergies  "

## 2024-10-18 NOTE — ED Provider Notes (Signed)
" °  Physical Exam  BP (!) 86/60   Pulse (!) 49   Temp (!) 100.7 F (38.2 C) (Rectal)   Resp (!) 31   SpO2 100%   Physical Exam  Procedures  Procedures  ED Course / MDM   Clinical Course as of 10/18/24 0013  Mon Oct 18, 2024  0007 Care of this patient assumed from preceding ED provider, Smoot, PA-C [RS]  0010 Consult to Dr. Norva, urology resident, who will present to the ED to place foley. I appreciate her collaboration in the care of this patient.  [RS]    Clinical Course User Index [RS] Suleyman Ehrman, Pleasant SAUNDERS, PA-C   Medical Decision Making Amount and/or Complexity of Data Reviewed Labs: ordered. Radiology: ordered.  Risk OTC drugs. Prescription drug management. Decision regarding hospitalization.    Care this patient has been present ED provider Lauraine Sharps, PA-C at time of shift change.  Please see her associated note for further insight to the patient's ED course.  In brief patient is a 83 year old male who will need to be admitted to the ICU for sepsis secondary to pneumonia.  Has concomitant phimosis with urinary retention; multiple nursing and ED providers in the emergency department have been unsuccessful attempted Foley placement.  Dr. Maree, PCCM, is amenable to admit patient to service secondary to requirement of pressors with pressures in the 80s.  At time of shift change patient pending only urology consult for management of phimosis with urinary retention.  Consult to urology resident as above, who will present to the emergency department to place catheter.  I appreciate their collaboration in the care of this patient.  This chart was dictated using voice recognition software, Dragon. Despite the best efforts of this provider to proofread and correct errors, errors may still occur which can change documentation meaning.       Barth Trella R, PA-C 10/18/24 0121    Melvenia Motto, MD 10/18/24 [phone]  "

## 2024-10-19 ENCOUNTER — Inpatient Hospital Stay (HOSPITAL_COMMUNITY)

## 2024-10-19 DIAGNOSIS — R7881 Bacteremia: Secondary | ICD-10-CM

## 2024-10-19 DIAGNOSIS — A419 Sepsis, unspecified organism: Secondary | ICD-10-CM

## 2024-10-19 DIAGNOSIS — R6520 Severe sepsis without septic shock: Secondary | ICD-10-CM | POA: Diagnosis not present

## 2024-10-19 LAB — BLOOD CULTURE ID PANEL (REFLEXED) - BCID2

## 2024-10-19 LAB — BASIC METABOLIC PANEL WITH GFR
Anion gap: 8 (ref 5–15)
BUN: 21 mg/dL (ref 8–23)
CO2: 22 mmol/L (ref 22–32)
Calcium: 8.3 mg/dL — ABNORMAL LOW (ref 8.9–10.3)
Chloride: 111 mmol/L (ref 98–111)
Creatinine, Ser: 0.84 mg/dL (ref 0.61–1.24)
GFR, Estimated: >60 mL/min
Glucose, Bld: 78 mg/dL (ref 70–99)
Potassium: 4.1 mmol/L (ref 3.5–5.1)
Sodium: 141 mmol/L (ref 135–145)

## 2024-10-19 LAB — URINE CULTURE

## 2024-10-19 LAB — PHOSPHORUS: Phosphorus: 2.2 mg/dL — ABNORMAL LOW (ref 2.5–4.6)

## 2024-10-19 LAB — ECHOCARDIOGRAM COMPLETE
Area-P 1/2: 4.15 cm2
Calc EF: 57.1 %
Height: 67 in
S' Lateral: 3.5 cm
Single Plane A2C EF: 51.4 %
Single Plane A4C EF: 56.9 %
Weight: 1788.37 [oz_av]

## 2024-10-19 LAB — GLUCOSE, CAPILLARY
Glucose-Capillary: 71 mg/dL (ref 70–99)
Glucose-Capillary: 67 mg/dL — ABNORMAL LOW (ref 70–99)
Glucose-Capillary: 77 mg/dL (ref 70–99)
Glucose-Capillary: 122 mg/dL — ABNORMAL HIGH (ref 70–99)
Glucose-Capillary: 90 mg/dL (ref 70–99)
Glucose-Capillary: 93 mg/dL (ref 70–99)

## 2024-10-19 LAB — MAGNESIUM: Magnesium: 2.4 mg/dL (ref 1.7–2.4)

## 2024-10-19 MED ORDER — ACETAMINOPHEN 325 MG PO TABS
650.0000 mg | ORAL_TABLET | Freq: Four times a day (QID) | ORAL | Status: DC | PRN
  Administered 2024-10-22: 650 mg via ORAL
  Filled 2024-10-19: qty 2

## 2024-10-19 MED ORDER — TAMSULOSIN HCL 0.4 MG PO CAPS
0.4000 mg | ORAL_CAPSULE | Freq: Every day | ORAL | Status: DC
  Administered 2024-10-19 – 2024-10-25 (×6): 0.4 mg via ORAL
  Filled 2024-10-19 (×7): qty 1

## 2024-10-19 MED ORDER — ABACAVIR-DOLUTEGRAVIR-LAMIVUD 600-50-300 MG PO TABS
1.0000 | ORAL_TABLET | Freq: Every day | ORAL | Status: DC
  Administered 2024-10-19 – 2024-10-24 (×5): 1 via ORAL
  Filled 2024-10-19 (×7): qty 1

## 2024-10-19 MED ORDER — DUTASTERIDE 0.5 MG PO CAPS
0.5000 mg | ORAL_CAPSULE | Freq: Every day | ORAL | Status: DC
  Administered 2024-10-19 – 2024-10-24 (×4): 0.5 mg via ORAL
  Filled 2024-10-19 (×7): qty 1

## 2024-10-19 MED ORDER — GUAIFENESIN 100 MG/5ML PO LIQD: 5.0000 mL | ORAL | Status: DC | PRN

## 2024-10-19 MED ORDER — DEXTROSE-SODIUM CHLORIDE 5-0.45 % IV SOLN: INTRAVENOUS | Status: AC

## 2024-10-19 MED ORDER — CLOPIDOGREL BISULFATE 75 MG PO TABS
75.0000 mg | ORAL_TABLET | Freq: Every day | ORAL | Status: DC
  Administered 2024-10-19 – 2024-10-24 (×4): 75 mg via ORAL
  Filled 2024-10-19 (×8): qty 1

## 2024-10-19 MED ORDER — DEXTROSE 50 % IV SOLN: 1.0000 | Freq: Once | INTRAVENOUS | Status: DC | PRN

## 2024-10-19 MED ORDER — GLUCAGON HCL RDNA (DIAGNOSTIC) 1 MG IJ SOLR: 1.0000 mg | INTRAMUSCULAR | Status: DC | PRN

## 2024-10-19 MED ORDER — POTASSIUM PHOSPHATES 15 MMOLE/5ML IV SOLN
15.0000 mmol | Freq: Once | INTRAVENOUS | Status: AC
  Administered 2024-10-19: 15 mmol via INTRAVENOUS
  Filled 2024-10-19: qty 5

## 2024-10-19 MED ORDER — LINEZOLID 600 MG/300ML IV SOLN
600.0000 mg | Freq: Two times a day (BID) | INTRAVENOUS | Status: DC
  Administered 2024-10-19 – 2024-10-20 (×3): 600 mg via INTRAVENOUS
  Filled 2024-10-19 (×3): qty 300

## 2024-10-19 MED ORDER — EZETIMIBE 10 MG PO TABS
10.0000 mg | ORAL_TABLET | Freq: Every day | ORAL | Status: DC
  Administered 2024-10-19 – 2024-10-24 (×4): 10 mg via ORAL
  Filled 2024-10-19 (×8): qty 1

## 2024-10-19 MED ORDER — SENNOSIDES-DOCUSATE SODIUM 8.6-50 MG PO TABS: 1.0000 | ORAL_TABLET | Freq: Every evening | ORAL | Status: DC | PRN

## 2024-10-19 MED ORDER — METOPROLOL TARTRATE 5 MG/5ML IV SOLN: 5.0000 mg | INTRAVENOUS | Status: DC | PRN

## 2024-10-19 MED ORDER — LOSARTAN POTASSIUM 50 MG PO TABS: 25.0000 mg | ORAL_TABLET | Freq: Every day | ORAL | Status: DC

## 2024-10-19 MED ORDER — LORATADINE 10 MG PO TABS
10.0000 mg | ORAL_TABLET | Freq: Every day | ORAL | Status: DC
  Administered 2024-10-19 – 2024-10-24 (×4): 10 mg via ORAL
  Filled 2024-10-19 (×7): qty 1

## 2024-10-19 MED ORDER — SODIUM CHLORIDE 0.9 % IV SOLN: INTRAVENOUS | Status: DC

## 2024-10-19 MED ORDER — IPRATROPIUM-ALBUTEROL 0.5-2.5 (3) MG/3ML IN SOLN: 3.0000 mL | RESPIRATORY_TRACT | Status: DC | PRN

## 2024-10-19 MED ORDER — HYDRALAZINE HCL 20 MG/ML IJ SOLN: 10.0000 mg | INTRAMUSCULAR | Status: DC | PRN

## 2024-10-19 NOTE — Progress Notes (Addendum)
 PHARMACY - PHYSICIAN COMMUNICATION CRITICAL VALUE ALERT - BLOOD CULTURE IDENTIFICATION (BCID)  Brian [name] is an 83 y.o. male who presented to [facility] on 10/17/2024 with productive cough/SOB >> presumed pneumonia >> now comfort care  Assessment:  1/4 blood cultures with GPC (not identified on BCID)-contaminate  -WBC trending down,afebrile, lactate tending down -MRSA negative  Name of physician (or Provider) Contacted: Dr. Haze  Current antibiotics: Cefepime  2g IV every 24 hours  Changes to prescribed antibiotics recommended:   -Continue cefepime   No results found for this or any previous visit.  Brian [name], PharmD, BCPS Clinical Pharmacist 10/19/2024 3:46 AM

## 2024-10-19 NOTE — Procedures (Signed)
 Modified Barium Swallow Study  Patient Details  Name: Brian [name] MRN: [id number] Date of Birth: September 23, 1941  Today's Date: 10/19/2024  Modified Barium Swallow completed.  Full report located under Chart Review in the Imaging Section.  History of Present Illness Brian [name] is an 83 y.o. male who presented to the hospital from SNF on 10/18/24 with productive cough and SOB. He was initially hypoxic in ED but this improved to 99% on RA. CXR showed left lower lobe and left middle area infiltrates. He was made NPO awaiting SLP evaluation. and admitted with sepsis from PNA, hypotension, AKI. PMH: COPD, HIV, CVA with right upper extremity weakness, dysphagia (2023 MBS reported trace silent aspiration of nectar thick and thin liquids which was eliminated when patient taking small sips.)   Clinical Impression  -The combination of his severe dysphagia, acute PNA, SNF report of overall deterioration in function all place him at a high risk for developing bacterial aspiration pneumonia.  -In order to maximize his airway protection, SLP recommends PO diet pudding thick liquids/puree solids. 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 (DIGEST score: 3-severe) and compared to images from 2023 MBS, it does appear that his swallow function has declined. Silent aspiration (PAS 8) occured with thin and nectar thick liquids and sensed aspiration only occured with gross aspiration of thin liquids. Laryngeal vestibule closure was incomplete and mistimed, leading to aspiration prior to and during swallow. Smaller sips as well as chin tuck posture did reduce but not eliminate the amount and frequency of aspiration; small sips of nectar thick liquids prevented aspiration and In addition, at end of study, patient indicated that he felt sick and started dry heaving. He proceeded to vomit clear-yellow liquid and barium contrast into emesis bag.  Factors that  may increase risk of adverse event in presence of aspiration Noe & Lianne 2021): Poor general health and/or compromised immunity;Frail or deconditioned;Limited mobility;Inadequate oral hygiene;Aspiration of thick, dense, and/or acidic materials  DIGEST Swallow Severity Rating*  Safety: 3  Efficiency:1  Overall Pharyngeal Swallow Severity: 3 1: mild; 2: moderate; 3: severe; 4: profound  *The Dynamic Imaging Grade of Swallowing Toxicity is standardized for the head and neck cancer population, however, demonstrates promising clinical applications across populations to standardize the clinical rating of pharyngeal swallow safety and severity.   Swallow Evaluation Recommendations Recommendations: PO diet PO Diet Recommendation: Dysphagia 1 (Pureed);Extremely thick liquids (Level 4, pudding thick) Liquid Administration via: Spoon Medication Administration: Crushed with puree Supervision: Full assist for feeding Swallowing strategies  : Slow rate;Small bites/sips Postural changes: Position pt fully upright for meals;Stay upright 30-60 min after meals Oral care recommendations: Oral care BID (2x/day)      Norleen IVAR Blase, MA, CCC-SLP Speech Therapy  10/19/2024,3:40 PM

## 2024-10-19 NOTE — Progress Notes (Signed)
 " PROGRESS NOTE    Brian [name]  FMW:[id number] DOB: 03-27-1941 DOA: 10/17/2024 PCP: Brian [name]    Brief Narrative:  83 year old Spanish-speaking male from [facility] DNR/DNI with past medical history of HTN, HIV on [place], CVA with right upper extremity weakness, COPD, chronic dysphagia presented with shortness of breath and cough.  Patient found to have sepsis secondary to pneumonia started on broad-spectrum antibiotics.  Patient was admitted to the ICU initially as he required pressors.   Assessment & Plan:  Septic shock secondary to aspiration versus healthcare acquired pneumonia Gram-positive cocci bacteremia - Chest x-ray showing left-sided infiltrate.  Initially required Levophed  now this has been off. -Initially received vancomycin  and cefepime , now on cefepime , will need to add GPC coverage back again - Blood cultures growing GPC, will repeat - Respiratory panel 20 pathogen, COVID/flu/RSV negative -Echocardiogram shows EF of 55%, dilated aortic root but no obvious vegetation.  Given patient's frailty and advanced comorbidity, we can hold off on TEE at this time and monitor repeat cultures  COPD -Not an active exacerbation.  Bronchodilators  Acute kidney injury -Baseline creatinine 0.9, admission creatinine 3.91.  Continue IV fluids.  HIV -Home p.o. medications  Essential hypertension -Holding home p.o. medication due to septic shock.  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  Patient remains DNR/DNI with comfort measures.  Only excepts antibiotics and IV fluids if indicated otherwise no feeding tube.  Palliative care team consulted by PCCM team  Failed speech and swallow eval. Gross aspiration noted on MBS  DVT  prophylaxis: SQ Hep    Code Status: Limited: Do not attempt resuscitation (DNR) -DNR-LIMITED -Do Not Intubate/DNI  Family Communication:   Status is: Inpatient Remains inpatient appropriate because: Ongoing management for septic shock secondary to pneumonia   PT Follow up Recs:   Subjective: Slightly more alert today but still very weak   Examination:  General exam: Appears calm and comfortable, chronically ill-appearing Respiratory system: Clear to auscultation. Respiratory effort normal. Cardiovascular system: S1 & S2 heard, RRR. No JVD, murmurs, rubs, gallops or clicks. No pedal edema. Gastrointestinal system: Abdomen is nondistended, soft and nontender. No organomegaly or masses felt. Normal bowel sounds heard. Central nervous system: Alert and oriented. No focal neurological deficits. Extremities: Symmetric 5 x 5 power. Skin: No rashes, lesions or ulcers Psychiatry: Judgement and insight appear poor Foley in place            Wound 10/18/24 0400 Pressure Injury Sacrum Bilateral Stage 1 -  Intact skin with non-blanchable redness of a localized area usually over a bony prominence. (Active)     Diet Orders (From admission, onward)     Start     Ordered   10/18/24 0045  Diet NPO time specified  Diet effective now        10/18/24 0045            Objective: Vitals:   10/19/24 0900 10/19/24 1000 10/19/24 1100 10/19/24 1200  BP: (!) 140/62 (!) 143/84 (!) 140/81 127/72  Pulse: 68 (!) 55 68 74  Resp: 17 12 18 19   Temp: 98.4 F (36.9 C) 98.6 F (37 C) 98.4 F (36.9 C) 98.4 F (36.9 C)  TempSrc:    Rectal  SpO2: 100% 100%  100% 100%  Weight:      Height:        Intake/Output Summary (Last 24 hours) at 10/19/2024 1317 Last data filed at 10/19/2024 1300 Gross per 24 hour  Intake 2893.17 ml  Output 575 ml  Net 2318.17 ml   Filed Weights   10/18/24 0045 10/18/24 0400 10/19/24 0420  Weight: 59.4 kg 60.6 kg 50.7 kg    Scheduled Meds:   abacavir -dolutegravir -lamiVUDine   1 tablet Oral Daily   arformoterol   15 mcg Nebulization BID   budesonide  (PULMICORT ) nebulizer solution  0.25 mg Nebulization BID   Chlorhexidine  Gluconate Cloth  6 each Topical QHS   clopidogrel   75 mg Oral Daily   dutasteride   0.5 mg Oral Daily   ezetimibe   10 mg Oral Daily   heparin   5,000 Units Subcutaneous Q8H   insulin  aspart  0-9 Units Subcutaneous Q4H   loratadine   10 mg Oral Daily   revefenacin   175 mcg Nebulization Daily   sodium chloride  HYPERTONIC  4 mL Nebulization BID   tamsulosin   0.4 mg Oral Daily   Continuous Infusions:  sodium chloride  5 mL/hr at 10/19/24 1300   ceFEPime  (MAXIPIME ) IV Stopped (10/18/24 2100)   dextrose  5 % and 0.45 % NaCl 75 mL/hr at 10/19/24 1300   linezolid  (ZYVOX ) IV Stopped (10/19/24 1107)   potassium PHOSPHATE  IVPB (in mmol)      Nutritional status     Body mass index is 17.51 kg/m.  Data Reviewed:   CBC: Recent Labs  Lab 10/17/24 2009 10/18/24 0148  WBC 18.7* 15.2*  NEUTROABS 17.4*  --   HGB 13.9 12.0*  HCT 41.5 34.7*  MCV 96.7 95.1  PLT 100* 78*   Basic Metabolic Panel: Recent Labs  Lab 10/17/24 2009 10/18/24 0148 10/19/24 0844  NA 141 138 141  K 4.5 4.3 4.1  CL 103 105 111  CO2 17* 17* 22  GLUCOSE 131* 99 78  BUN 48* 47* 21  CREATININE 3.91* 3.29* 0.84  CALCIUM  9.0 8.3* 8.3*  MG  --  1.4* 2.4  PHOS  --  4.6 2.2*   GFR: Estimated Creatinine Clearance: 47.8 mL/min (by C-G formula based on SCr of 0.84 mg/dL). Liver Function Tests: Recent Labs  Lab 10/17/24 2009  AST 86*  ALT 21  ALKPHOS 54  BILITOT 1.2  PROT 6.6  ALBUMIN 3.4*   No results for input(s): LIPASE, AMYLASE in the last 168 hours. No results for input(s): AMMONIA in the last 168 hours. Coagulation Profile: Recent Labs  Lab 10/17/24 2009  INR 1.4*   Cardiac Enzymes: No results for input(s): CKTOTAL, CKMB, CKMBINDEX, TROPONINI in the last 168 hours. BNP (last 3 results) No results for  input(s): PROBNP in the last 8760 hours. HbA1C: No results for input(s): HGBA1C in the last 72 hours. CBG: Recent Labs  Lab 10/18/24 2306 10/19/24 0351 10/19/24 0749 10/19/24 0833 10/19/24 1233  GLUCAP 79 71 67* 77 122*   Lipid Profile: No results for input(s): CHOL, HDL, LDLCALC, TRIG, CHOLHDL, LDLDIRECT in the last 72 hours. Thyroid Function Tests: No results for input(s): TSH, T4TOTAL, FREET4, T3FREE, THYROIDAB in the last 72 hours. Anemia Panel: No results for input(s): VITAMINB12, FOLATE, FERRITIN, TIBC, IRON, RETICCTPCT in the last 72 hours. Sepsis Labs: Recent Labs  Lab 10/17/24 2222 10/18/24 0140 10/18/24 0449 10/18/24 0826  LATICACIDVEN 8.0* 4.7* 2.7* 1.6    Recent Results (from the past 240 hours)  Resp panel by RT-PCR (RSV, Flu A&B, Covid) Anterior Nasal Swab  Status: None   Collection Time: 10/17/24  8:16 PM   Specimen: Anterior Nasal Swab  Result Value Ref Range Status   SARS Coronavirus 2 by RT PCR NEGATIVE NEGATIVE Final   Influenza A by PCR NEGATIVE NEGATIVE Final   Influenza B by PCR NEGATIVE NEGATIVE Final    Comment: (NOTE) The Xpert Xpress SARS-CoV-2/FLU/RSV plus assay is intended as an aid in the diagnosis of influenza from Nasopharyngeal swab specimens and should not be used as a sole basis for treatment. Nasal washings and aspirates are unacceptable for Xpert Xpress SARS-CoV-2/FLU/RSV testing.  Fact Sheet for Patients: bloggercourse.com  Fact Sheet for Healthcare Providers: seriousbroker.it  This test is not yet approved or cleared by the [place]  FDA and has been authorized for detection and/or diagnosis of SARS-CoV-2 by FDA under an Emergency Use Authorization (EUA). This EUA will remain in effect (meaning this test can be used) for the duration of the COVID-19 declaration under Section 564(b)(1) of the Act, 21 U.S.C. section 360bbb-3(b)(1),  unless the authorization is terminated or revoked.     Resp Syncytial Virus by PCR NEGATIVE NEGATIVE Final    Comment: (NOTE) Fact Sheet for Patients: bloggercourse.com  Fact Sheet for Healthcare Providers: seriousbroker.it  This test is not yet approved or cleared by the [place]  FDA and has been authorized for detection and/or diagnosis of SARS-CoV-2 by FDA under an Emergency Use Authorization (EUA). This EUA will remain in effect (meaning this test can be used) for the duration of the COVID-19 declaration under Section 564(b)(1) of the Act, 21 U.S.C. section 360bbb-3(b)(1), unless the authorization is terminated or revoked.  Performed at [facility], [address], [place], [place] [address]   Blood Culture (routine x 2)     Status: None (Preliminary result)   Collection Time: 10/17/24  8:16 PM   Specimen: BLOOD RIGHT FOREARM  Result Value Ref Range Status   Specimen Description BLOOD RIGHT FOREARM  Final   Special Requests   Final    BOTTLES DRAWN AEROBIC ONLY Blood Culture results may not be optimal due to an inadequate volume of blood received in culture bottles   Culture  Setup Time   Final    GRAM POSITIVE COCCI AEROBIC BOTTLE ONLY CRITICAL VALUE NOTED.  VALUE IS CONSISTENT WITH PREVIOUSLY REPORTED AND CALLED VALUE. Performed at [facility], [address] [place], [place] [address]    Culture GRAM POSITIVE COCCI  Final   Report Status PENDING  Incomplete  Blood Culture (routine x 2)     Status: None (Preliminary result)   Collection Time: 10/17/24  8:21 PM   Specimen: BLOOD  Result Value Ref Range Status   Specimen Description BLOOD RIGHT ANTECUBITAL  Final   Special Requests   Final    BOTTLES DRAWN AEROBIC AND ANAEROBIC Blood Culture adequate volume   Culture  Setup Time   Final    GRAM POSITIVE COCCI ANAEROBIC BOTTLE ONLY CRITICAL RESULT CALLED TO, READ BACK BY AND VERIFIED WITH: [facility]   PHARMD 10/19/2024 @ 0335 BY DD Performed at [facility], [address] [place], [place] [address]    Culture GRAM POSITIVE COCCI  Final   Report Status PENDING  Incomplete  Blood Culture ID Panel (Reflexed)     Status: None   Collection Time: 10/17/24  8:21 PM  Result Value Ref Range Status   Enterococcus faecalis NOT DETECTED NOT DETECTED Final   Enterococcus Faecium NOT DETECTED NOT DETECTED Final   Listeria  monocytogenes NOT DETECTED NOT DETECTED Final   Staphylococcus species NOT DETECTED NOT DETECTED Final   Staphylococcus aureus (BCID) NOT DETECTED NOT DETECTED Final   Staphylococcus epidermidis NOT DETECTED NOT DETECTED Final   Staphylococcus lugdunensis NOT DETECTED NOT DETECTED Final   Streptococcus species NOT DETECTED NOT DETECTED Final   Streptococcus agalactiae NOT DETECTED NOT DETECTED Final   Streptococcus pneumoniae NOT DETECTED NOT DETECTED Final   Streptococcus pyogenes NOT DETECTED NOT DETECTED Final   A.calcoaceticus-baumannii NOT DETECTED NOT DETECTED Final   Bacteroides fragilis NOT DETECTED NOT DETECTED Final   Enterobacterales NOT DETECTED NOT DETECTED Final   Enterobacter cloacae complex NOT DETECTED NOT DETECTED Final   Escherichia coli NOT DETECTED NOT DETECTED Final   Klebsiella aerogenes NOT DETECTED NOT DETECTED Final   Klebsiella oxytoca NOT DETECTED NOT DETECTED Final   Klebsiella pneumoniae NOT DETECTED NOT DETECTED Final   Proteus species NOT DETECTED NOT DETECTED Final   Salmonella species NOT DETECTED NOT DETECTED Final   Serratia marcescens NOT DETECTED NOT DETECTED Final   Haemophilus influenzae NOT DETECTED NOT DETECTED Final   Neisseria meningitidis NOT DETECTED NOT DETECTED Final   Pseudomonas aeruginosa NOT DETECTED NOT DETECTED Final   Stenotrophomonas maltophilia NOT DETECTED NOT DETECTED Final   Candida albicans NOT DETECTED NOT DETECTED Final   Candida auris NOT DETECTED NOT DETECTED Final   Candida glabrata NOT DETECTED NOT  DETECTED Final   Candida krusei NOT DETECTED NOT DETECTED Final   Candida parapsilosis NOT DETECTED NOT DETECTED Final   Candida tropicalis NOT DETECTED NOT DETECTED Final   Cryptococcus neoformans/gattii NOT DETECTED NOT DETECTED Final    Comment: Performed at [facility], [address] [place], [place] [address]  MRSA Next Gen by PCR, Nasal     Status: None   Collection Time: 10/18/24 12:44 AM   Specimen: Nasal Mucosa; Nasal Swab  Result Value Ref Range Status   MRSA by PCR Next Gen NOT DETECTED NOT DETECTED Final    Comment: (NOTE) The GeneXpert MRSA Assay (FDA approved for NASAL specimens only), is one component of a comprehensive MRSA colonization surveillance program. It is not intended to diagnose MRSA infection nor to guide or monitor treatment for MRSA infections. Test performance is not FDA approved in patients less than 58 years old. Performed at [facility], [address] [place], [place] [address]   Urine Culture     Status: Abnormal   Collection Time: 10/18/24 12:55 AM   Specimen: Urine, Random  Result Value Ref Range Status   Specimen Description URINE, RANDOM  Final   Special Requests   Final    NONE Reflexed from [id number] Performed at [facility], [address] [place], [place] [address]    Culture MULTIPLE SPECIES PRESENT, SUGGEST RECOLLECTION (A)  Final   Report Status 10/19/2024 FINAL  Final  Respiratory (~20 pathogens) panel by PCR     Status: None   Collection Time: 10/18/24  9:59 AM   Specimen: Nasopharyngeal Swab; Respiratory  Result Value Ref Range Status   Adenovirus NOT DETECTED NOT DETECTED Final   Coronavirus 229E NOT DETECTED NOT DETECTED Final    Comment: (NOTE) The Coronavirus on the Respiratory Panel, DOES NOT test for the novel  Coronavirus (2019 nCoV)    Coronavirus HKU1 NOT DETECTED NOT DETECTED Final   Coronavirus NL63 NOT DETECTED NOT DETECTED Final   Coronavirus OC43 NOT DETECTED NOT DETECTED Final    Metapneumovirus NOT DETECTED NOT DETECTED Final  Rhinovirus / Enterovirus NOT DETECTED NOT DETECTED Final   Influenza A NOT DETECTED NOT DETECTED Final   Influenza B NOT DETECTED NOT DETECTED Final   Parainfluenza Virus 1 NOT DETECTED NOT DETECTED Final   Parainfluenza Virus 2 NOT DETECTED NOT DETECTED Final   Parainfluenza Virus 3 NOT DETECTED NOT DETECTED Final   Parainfluenza Virus 4 NOT DETECTED NOT DETECTED Final   Respiratory Syncytial Virus NOT DETECTED NOT DETECTED Final   Bordetella pertussis NOT DETECTED NOT DETECTED Final   Bordetella Parapertussis NOT DETECTED NOT DETECTED Final   Chlamydophila pneumoniae NOT DETECTED NOT DETECTED Final   Mycoplasma pneumoniae NOT DETECTED NOT DETECTED Final    Comment: Performed at [facility], [address], [place], [place] [address]         Radiology Studies: ECHOCARDIOGRAM COMPLETE Result Date: 10/19/2024    ECHOCARDIOGRAM REPORT   Patient Name:   Brian [name] Date of Exam: 10/19/2024 Medical Rec #:  [id number]    Height:       67.0 in Accession #:    [id number]   Weight:       111.8 lb Date of Birth:  [birth year]     BSA:          1.579 m Patient Age:    83 years     BP:           145/73 mmHg Patient Gender: M            HR:           57 bpm. Exam Location:  Inpatient Procedure: 2D Echo, Cardiac Doppler and Color Doppler (Both Spectral and Color            Flow Doppler were utilized during procedure). Indications:    Bacteremia R78.81  History:        Patient has prior history of Echocardiogram examinations, most                 recent 04/25/2022. Previous Myocardial Infarction, Stroke; Risk                 Factors:Dyslipidemia.  Sonographer:    Tinnie Gosling RDCS Referring Phys: [id number] BURGESS BROCKS Aleesia Henney IMPRESSIONS  1. Left ventricular ejection fraction, by estimation, is 50 to 55%. The left ventricle has low normal function. The left ventricle has no regional wall motion abnormalities. There is mild concentric left ventricular hypertrophy.  Left ventricular diastolic parameters are consistent with Grade II diastolic dysfunction (pseudonormalization).  2. Right ventricular systolic function is normal. The right ventricular size is normal.  3. There is no evidence of cardiac tamponade.  4. The mitral valve is normal in structure. Trivial mitral valve regurgitation. No evidence of mitral stenosis.  5. The aortic valve is normal in structure. There is mild calcification of the aortic valve. Aortic valve regurgitation is mild. No aortic stenosis is present.  6. Aortic dilatation noted. 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  7. The inferior vena cava is normal in size with greater than 50% respiratory variability, suggesting right atrial pressure of 3 mmHg. Conclusion(s)/Recommendation(s): No obvious vegetations seen on chest wall echo. The aortic valve is mildly calcified but I do not see a frank vegetation. If clinical suspicion for endocarditis is high, would recommned TEE. FINDINGS  Left Ventricle: Left ventricular ejection fraction, by estimation, is 50 to 55%. The left ventricle has low normal function. The left  ventricle has no regional wall motion abnormalities. The left ventricular internal cavity size was normal in size. There is mild concentric left ventricular hypertrophy. Left ventricular diastolic parameters are consistent with Grade II diastolic dysfunction (pseudonormalization). Right Ventricle: The right ventricular size is normal. No increase in right ventricular wall thickness. Right ventricular systolic function is normal. Left Atrium: Left atrial size was normal in size. Right Atrium: Right atrial size was normal in size. Pericardium: Trivial pericardial effusion is present. The pericardial effusion is anterior to the right ventricle. There is no evidence of cardiac tamponade. Mitral Valve: The mitral valve is normal in structure. Trivial mitral  valve regurgitation. No evidence of mitral valve stenosis. Tricuspid Valve: The tricuspid valve is normal in structure. Tricuspid valve regurgitation is mild . No evidence of tricuspid stenosis. Aortic Valve: The aortic valve is normal in structure. There is mild calcification of the aortic valve. Aortic valve regurgitation is mild. No aortic stenosis is present. Pulmonic Valve: The pulmonic valve was normal in structure. Pulmonic valve regurgitation is trivial. No evidence of pulmonic stenosis. Aorta: Aortic dilatation noted. 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 Venous: The inferior vena cava is normal in size with greater than 50% respiratory variability, suggesting right atrial pressure of 3 mmHg. IAS/Shunts: No atrial level shunt detected by color flow Doppler.  LEFT VENTRICLE PLAX 2D LVIDd:         5.00 cm     Diastology LVIDs:         3.50 cm     LV e' medial:    4.03 cm/s LV PW:         1.20 cm     LV E/e' medial:  16.5 LV IVS:        1.20 cm     LV e' lateral:   3.81 cm/s LVOT diam:     2.00 cm     LV E/e' lateral: 17.4 LV SV:         53 LV SV Index:   34 LVOT Area:     3.14 cm  LV Volumes (MOD) LV vol d, MOD A2C: 60.9 ml LV vol d, MOD A4C: 77.7 ml LV vol s, MOD A2C: 29.6 ml LV vol s, MOD A4C: 33.5 ml LV SV MOD A2C:     31.3 ml LV SV MOD A4C:     77.7 ml LV SV MOD BP:      41.8 ml RIGHT VENTRICLE             IVC RV S prime:     10.90 cm/s  IVC diam: 1.90 cm TAPSE (M-mode): 2.0 cm LEFT ATRIUM           Index LA diam:      3.70 cm 2.34 cm/m LA Vol (A4C): 41.5 ml 26.28 ml/m  AORTIC VALVE LVOT Vmax:   67.90 cm/s LVOT Vmean:  47.800 cm/s LVOT VTI:    0.169 m  AORTA Ao Root diam: 4.00 cm Ao Asc diam:  4.00 cm MITRAL VALVE               TRICUSPID VALVE MV Area (PHT): 4.15 cm    TR Peak grad:   23.2 mmHg MV E velocity: 66.40 cm/s  TR Vmax:        241.00 cm/s MV A velocity: 69.80 cm/s MV E/A ratio:  0.95        SHUNTS  Systemic VTI:  0.17 m                            Systemic Diam: 2.00 cm Toribio Fuel [name] Electronically signed by Toribio Fuel [name] Signature Date/Time: 10/19/2024/11:06:07 AM    Final    DG Chest Port 1 View if patient is in a treatment room. Result Date: 10/17/2024 EXAM: 1 VIEW(S) XRAY OF THE CHEST 10/17/2024 08:24:00 PM COMPARISON: 05/06/24. CLINICAL HISTORY: Suspected Sepsis Suspected Sepsis FINDINGS: LUNGS AND PLEURA: Retrocardiac airspace opacities. Infiltrates in the left midlung. No pleural effusion. No pneumothorax. HEART AND MEDIASTINUM: No acute abnormality of the cardiac and mediastinal silhouettes. BONES AND SOFT TISSUES: No acute osseous abnormality. Left chest wall pacemaker. IMPRESSION: 1. Left mid and lower lung pneumonia. Electronically signed by: Norman Gatlin [name] 10/17/2024 08:31 PM EST RP Workstation: [id number]           LOS: 1 day   Time spent= 35 mins    Burgess JAYSON Dare, [name] [facility]  If 7PM-7AM, please contact night-coverage  10/19/2024, 1:17 PM  "

## 2024-10-19 NOTE — TOC Initial Note (Signed)
 Transition of Care [facility]) - Initial/Assessment Note    Patient Details  Name: Brian [name] MRN: [id number] Date of Birth: 01/22/1941  Transition [facility]) CM/SW Contact:    Jon ONEIDA Anon, RN Phone Number: 10/19/2024, 10:55 AM  Clinical Narrative:                 Pt admitted from [facility] for STR. Pt brought in via EMS with complaints of productive cough, SOB, and deteriorating VS at the nursing facility. Pt needing continued medical workup, not medically ready for discharge. If plan is to return to SNF will need PT/OT evaluation, insurance authorization. ICM will continue to follow for DC planning needs.      Expected Discharge Plan:  (TBD) Barriers to Discharge: Continued Medical Work up   Patient Goals and CMS Choice Patient states their goals for this hospitalization and ongoing recovery are:: Home CMS Medicare.gov Compare Post Acute Care list provided to:: Patient Represenative (must comment) (Pt son) Choice offered to / list presented to : Adult Children [place] ownership interest in [facility].provided to:: Adult Children    Expected Discharge Plan and Services In-house Referral: NA Discharge Planning Services: CM Consult Post Acute Care Choice: Durable Medical Equipment Living arrangements for the past 2 months: Single Family Home                 DME Arranged: N/A DME Agency: NA       HH Arranged: NA HH Agency: NA        Prior Living Arrangements/Services Living arrangements for the past 2 months: Single Family Home Lives with:: Self Patient language and need for interpreter reviewed:: Yes Do you feel safe going back to the place where you live?: Yes      Need for Family Participation in Patient Care: Yes (Comment) Care giver support system in place?: Yes (comment) Current home services: DME Criminal Activity/Legal Involvement Pertinent to Current Situation/Hospitalization: No - Comment as needed  Activities of Daily  Living      Permission Sought/Granted Permission sought to share information with : Facility Medical Sales Representative    Share Information with NAME: Andy, Allende, Emergency Contact  [phone]           Emotional Assessment Appearance:: Appears stated age Attitude/Demeanor/Rapport: Unable to Assess Affect (typically observed): Unable to Assess Orientation: : Oriented to Self Alcohol  / Substance Use: Not Applicable Psych Involvement: No (comment)  Admission diagnosis:  Urinary retention [R33.9] AKI (acute kidney injury) [N17.9] Severe sepsis (HCC) [A41.9, R65.20] Pneumonia of left lung due to infectious organism, unspecified part of lung [J18.9] Sepsis with acute organ dysfunction and septic shock, due to unspecified organism, unspecified organ dysfunction type (HCC) [A41.9, R65.21] Patient Active Problem List   Diagnosis Date Noted   Severe sepsis (HCC) 10/18/2024   Urinary retention 10/18/2024   Pneumonia of left lung due to infectious organism 10/18/2024   Pressure injury of skin 10/18/2024   Fall 09/21/2024   Rhabdomyolysis 09/21/2024   Closed fracture of one rib of left side 11/29/2023   Generalized weakness 11/28/2023   UTI (urinary tract infection) 11/28/2023   Acute metabolic encephalopathy 11/28/2023   Sepsis (HCC) 07/17/2023   History of stroke 04/25/2022   Hyperlipidemia    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 [place] [address] Phone: [phone] Fax: [phone]  [facility] - [place], [place] - [address] [place] [address] [place] [address] [place] [address] Phone: [phone] Fax: [phone]     Social Drivers of Health (SDOH) Social History: SDOH Screenings   Food Insecurity: Patient Unable To Answer (11/29/2023)  Housing: Patient Unable To Answer (11/29/2023)  Transportation Needs: Patient Unable To Answer (10/18/2024)  Utilities: Patient Unable To Answer (11/29/2023)  Depression (PHQ2-9): Low Risk (06/25/2022)  Social Connections: Patient Unable To Answer (11/29/2023)  Tobacco Use: Medium Risk (10/17/2024)   SDOH Interventions:     Readmission Risk Interventions    10/19/2024   10:47 AM  Readmission Risk Prevention Plan  Transportation Screening Complete  PCP or Specialist Appt within 5-7 Days Complete  Home Care Screening Complete  Medication Review (RN CM) Complete

## 2024-10-19 NOTE — Progress Notes (Signed)
" °  Echocardiogram 2D Echocardiogram has been performed.  Tinnie FORBES Gosling RDCS 10/19/2024, 9:48 AM "

## 2024-10-19 NOTE — Plan of Care (Signed)
   Problem: Nutritional: Goal: Maintenance of adequate nutrition will improve Outcome: Progressing   Problem: Skin Integrity: Goal: Risk for impaired skin integrity will decrease Outcome: Progressing   Problem: Tissue Perfusion: Goal: Adequacy of tissue perfusion will improve Outcome: Progressing

## 2024-10-19 NOTE — Hospital Course (Addendum)
 Brief Narrative:  83 year old Spanish-speaking male from SNF [facility] DNR/DNI with past medical history of HTN, HIV on [place], CVA with right upper extremity weakness, COPD, chronic dysphagia presented with shortness of breath and cough.  Patient found to have sepsis secondary to pneumonia started on broad-spectrum antibiotics.  Patient was admitted to the ICU initially as he required pressors. Initially required Levophed  now this has been off. Respiratory panel 20 pathogen, COVID/flu/RSV negative. Echocardiogram shows EF of 55%, dilated aortic root but no obvious vegetation.  Initial cultures grew E faecalis bacteremia, repeat currently remains unremarkable. After having prolonged discussion with the family members it was decided that we will treat empirically with p.o. antibiotics amoxicillin  1 g 3 times daily for 6 weeks.  Family does understand this is not standard of care as they do not want TEE or IV antibiotics outpatient.   TOC consulted, as long as we have safe disposition we can discharge him.   Assessment & Plan:  Septic shock secondary to aspiration versus healthcare acquired pneumonia E faecalis bacteremia - Chest x-ray showing left-sided infiltrate.  Initially required Levophed  now this has been off. Respiratory panel 20 pathogen, COVID/flu/RSV negative. Echocardiogram shows EF of 55%, dilated aortic root but no obvious vegetation.  Initial cultures grew E faecalis bacteremia, repeat currently remains unremarkable - After having prolonged discussion with the family members it was decided that we will treat empirically with p.o. antibiotics amoxicillin  1 g 3 times daily for 6 weeks.  Family does understand this is not standard of care as they do not want TEE or IV antibiotics outpatient  COPD -Not an active exacerbation.  Bronchodilators  Acute kidney injury: Resolved.  -Baseline creatinine 0.9, admission creatinine 3.91.  Continue IV fluids.  HIV -Home p.o.  medications  Essential hypertension - Home p.o. 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  Patient remains DNR/DNI with comfort measures.  Only excepts antibiotics and IV fluids if indicated otherwise no feeding tube.  Palliative care team consulted by PCCM team  Failed speech and swallow eval. severe aspirations noted on MBS.  Recommended against any advance feeding tube, family agrees.  DVT prophylaxis: SQ Hep Code Status= DNR/DNI Family Communication: Met with family members at bedside on 12/26 along with palliative care team Status is: Inpatient Remains inpatient appropriate because: Notified TOC the patient is ready for discharge as long as we have safe disposition   PT Follow up Recs:   Subjective: Overall still weak.  Poor oral intake  Examination:  General exam: Appears calm and comfortable, chronically ill-appearing Respiratory system: Clear to auscultation. Respiratory effort normal. Cardiovascular system: S1 & S2 heard, RRR. No JVD, murmurs, rubs, gallops or clicks. No pedal edema. Gastrointestinal system: Abdomen is nondistended, soft and nontender. No organomegaly or masses felt. Normal bowel sounds heard. Central nervous system: Alert and oriented. No focal neurological deficits. Extremities: Symmetric 5 x 5 power. Skin: No rashes, lesions or ulcers Psychiatry: Judgement and insight appear poor Foley in place

## 2024-10-20 DIAGNOSIS — B20 Human immunodeficiency virus [HIV] disease: Secondary | ICD-10-CM

## 2024-10-20 DIAGNOSIS — R7881 Bacteremia: Secondary | ICD-10-CM

## 2024-10-20 DIAGNOSIS — J168 Pneumonia due to other specified infectious organisms: Secondary | ICD-10-CM | POA: Diagnosis not present

## 2024-10-20 DIAGNOSIS — B952 Enterococcus as the cause of diseases classified elsewhere: Secondary | ICD-10-CM

## 2024-10-20 DIAGNOSIS — R6520 Severe sepsis without septic shock: Secondary | ICD-10-CM | POA: Diagnosis not present

## 2024-10-20 DIAGNOSIS — A4181 Sepsis due to Enterococcus: Secondary | ICD-10-CM

## 2024-10-20 DIAGNOSIS — A419 Sepsis, unspecified organism: Secondary | ICD-10-CM | POA: Diagnosis not present

## 2024-10-20 DIAGNOSIS — R339 Retention of urine, unspecified: Secondary | ICD-10-CM

## 2024-10-20 DIAGNOSIS — L8990 Pressure ulcer of unspecified site, unspecified stage: Secondary | ICD-10-CM

## 2024-10-20 LAB — GLUCOSE, CAPILLARY
Glucose-Capillary: 110 mg/dL — ABNORMAL HIGH (ref 70–99)
Glucose-Capillary: 110 mg/dL — ABNORMAL HIGH (ref 70–99)
Glucose-Capillary: 116 mg/dL — ABNORMAL HIGH (ref 70–99)
Glucose-Capillary: 141 mg/dL — ABNORMAL HIGH (ref 70–99)
Glucose-Capillary: 92 mg/dL (ref 70–99)
Glucose-Capillary: 94 mg/dL (ref 70–99)

## 2024-10-20 LAB — BASIC METABOLIC PANEL WITH GFR
Anion gap: 8 (ref 5–15)
BUN: 12 mg/dL (ref 8–23)
CO2: 23 mmol/L (ref 22–32)
Calcium: 7.7 mg/dL — ABNORMAL LOW (ref 8.9–10.3)
Chloride: 108 mmol/L (ref 98–111)
Creatinine, Ser: 0.76 mg/dL (ref 0.61–1.24)
GFR, Estimated: >60 mL/min
Glucose, Bld: 87 mg/dL (ref 70–99)
Potassium: 3.6 mmol/L (ref 3.5–5.1)
Sodium: 139 mmol/L (ref 135–145)

## 2024-10-20 LAB — MAGNESIUM: Magnesium: 2.3 mg/dL (ref 1.7–2.4)

## 2024-10-20 MED ORDER — SODIUM CHLORIDE 0.9 % IV SOLN
3.0000 g | Freq: Four times a day (QID) | INTRAVENOUS | Status: DC
  Administered 2024-10-20 – 2024-10-26 (×24): 3 g via INTRAVENOUS
  Filled 2024-10-20 (×24): qty 8

## 2024-10-20 MED ORDER — VANCOMYCIN HCL IN DEXTROSE 1-5 GM/200ML-% IV SOLN
1000.0000 mg | Freq: Once | INTRAVENOUS | Status: AC
  Administered 2024-10-20: 1000 mg via INTRAVENOUS
  Filled 2024-10-20: qty 200

## 2024-10-20 MED ORDER — VANCOMYCIN HCL 750 MG/150ML IV SOLN
750.0000 mg | INTRAVENOUS | Status: DC
  Administered 2024-10-21 – 2024-10-23 (×3): 750 mg via INTRAVENOUS
  Filled 2024-10-20 (×4): qty 150

## 2024-10-20 NOTE — Progress Notes (Signed)
 PHARMACY - PHYSICIAN COMMUNICATION CRITICAL VALUE ALERT - BLOOD CULTURE IDENTIFICATION (BCID)  Brian [name] is an 83 y.o. male who presented to [facility] on 10/17/2024 with a chief complaint of productive cough and shortness of breath.   Assessment:  83 year old male admitted with suspected pneumonia. Now with gram positive cocci in both sets of blood cultures. Enterococcus faecalis now identified in one set and ID of other GPC still pending per microbiology lab. Notably, patient does have a pacemaker in place.   Name of physician (or Provider) Contacted: Caleen + Dennise (ID)   Current antibiotics: Linezolid /cefepime   Changes to prescribed antibiotics recommended:  Change antibiotics to vanc/unasyn  while awaiting ID of further gram positive cocci   Results for orders placed or performed during the hospital encounter of 10/17/24  Blood Culture ID Panel (Reflexed) (Collected: 10/17/2024  8:21 PM)  Result Value Ref Range   Enterococcus faecalis NOT DETECTED NOT DETECTED   Enterococcus Faecium NOT DETECTED NOT DETECTED   Listeria monocytogenes NOT DETECTED NOT DETECTED   Staphylococcus species NOT DETECTED NOT DETECTED   Staphylococcus aureus (BCID) NOT DETECTED NOT DETECTED   Staphylococcus epidermidis NOT DETECTED NOT DETECTED   Staphylococcus lugdunensis NOT DETECTED NOT DETECTED   Streptococcus species NOT DETECTED NOT DETECTED   Streptococcus agalactiae NOT DETECTED NOT DETECTED   Streptococcus pneumoniae NOT DETECTED NOT DETECTED   Streptococcus pyogenes NOT DETECTED NOT DETECTED   A.calcoaceticus-baumannii NOT DETECTED NOT DETECTED   Bacteroides fragilis NOT DETECTED NOT DETECTED   Enterobacterales NOT DETECTED NOT DETECTED   Enterobacter cloacae complex NOT DETECTED NOT DETECTED   Escherichia coli NOT DETECTED NOT DETECTED   Klebsiella aerogenes NOT DETECTED NOT DETECTED   Klebsiella oxytoca NOT DETECTED NOT DETECTED   Klebsiella pneumoniae NOT DETECTED NOT DETECTED   Proteus  species NOT DETECTED NOT DETECTED   Salmonella species NOT DETECTED NOT DETECTED   Serratia marcescens NOT DETECTED NOT DETECTED   Haemophilus influenzae NOT DETECTED NOT DETECTED   Neisseria meningitidis NOT DETECTED NOT DETECTED   Pseudomonas aeruginosa NOT DETECTED NOT DETECTED   Stenotrophomonas maltophilia NOT DETECTED NOT DETECTED   Candida albicans NOT DETECTED NOT DETECTED   Candida auris NOT DETECTED NOT DETECTED   Candida glabrata NOT DETECTED NOT DETECTED   Candida krusei NOT DETECTED NOT DETECTED   Candida parapsilosis NOT DETECTED NOT DETECTED   Candida tropicalis NOT DETECTED NOT DETECTED   Cryptococcus neoformans/gattii NOT DETECTED NOT DETECTED    Damien Quiet, PharmD, BCPS, BCIDP Infectious Diseases Clinical Pharmacist Phone: [phone] 10/20/2024  12:38 PM

## 2024-10-20 NOTE — Progress Notes (Signed)
 Pharmacy Antibiotic Note  Brian [name] is a 83 y.o. male admitted on 10/17/2024 with bacteremia/possible pneumonia. Blood cultures updated today to show enterococcus faecalis in one set.   Pharmacy has been consulted for vancomycin   dosing. Patient last received vancomycin  on 12/21. SCr <1 but noted patient with low weight.   Plan: Vancomycin  1000 mg x 1 for re-load then 750 mg every 24 hours  Predicted AUC 463 with Scr- 0.8 and TBW  Monitor blood cultures, renal function and goals of care   Height: 5' 7 (170.2 cm) Weight: 49.7 kg (109 lb 9.1 oz) IBW/kg (Calculated) : 66.1  Temp (24hrs), Avg:98.4 F (36.9 C), Min:97.5 F (36.4 C), Max:98.8 F (37.1 C)  Recent Labs  Lab 10/17/24 2009 10/17/24 2024 10/17/24 2222 10/18/24 0140 10/18/24 0148 10/18/24 0449 10/18/24 0826 10/19/24 0844 10/20/24 0258  WBC 18.7*  --   --   --  15.2*  --   --   --   --   CREATININE 3.91*  --   --   --  3.29*  --   --  0.84 0.76  LATICACIDVEN  --  7.5* 8.0* 4.7*  --  2.7* 1.6  --   --     Estimated Creatinine Clearance: 49.2 mL/min (by C-G formula based on SCr of 0.76 mg/dL).    Allergies[1]  Thank you for allowing pharmacy to be a part of this patients care.  Damien Quiet, PharmD, BCPS, BCIDP Infectious Diseases Clinical Pharmacist Phone: [phone] 10/20/2024 12:43 PM     [1] No Known Allergies

## 2024-10-20 NOTE — Plan of Care (Signed)
" °  Problem: Clinical Measurements: Goal: Ability to maintain clinical measurements within normal limits will improve Outcome: Progressing Goal: Will remain free from infection Outcome: Progressing Goal: Diagnostic test results will improve Outcome: Progressing Goal: Respiratory complications will improve Outcome: Progressing Goal: Cardiovascular complication will be avoided Outcome: Progressing   Problem: Activity: Goal: Risk for activity intolerance will decrease Outcome: Progressing   Problem: Nutrition: Goal: Adequate nutrition will be maintained Outcome: Progressing   Problem: Coping: Goal: Level of anxiety will decrease Outcome: Progressing   Problem: Elimination: Goal: Will not experience complications related to bowel motility Outcome: Progressing Goal: Will not experience complications related to urinary retention Outcome: Progressing   Problem: Pain Managment: Goal: General experience of comfort will improve and/or be controlled Outcome: Progressing   Problem: Safety: Goal: Ability to remain free from injury will improve Outcome: Progressing   Problem: Skin Integrity: Goal: Risk for impaired skin integrity will decrease Outcome: Progressing   Cindy S. Loreli BSN, RN, CCRP, CCRN 10/20/2024 5:44 AM "

## 2024-10-20 NOTE — Progress Notes (Signed)
 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  Patient found to have sepsis secondary to pneumonia started on broad-spectrum antibiotics.  Patient was admitted to the ICU initially as he required pressors.   Assessment & Plan:  Septic shock secondary to aspiration versus healthcare acquired pneumonia Gram-positive cocci bacteremia - Chest x-ray showing left-sided infiltrate.  Initially required Levophed  now this has been off. - Continue linezolid  and cefepime  - Blood cultures growing GPC, cultures repeated - Respiratory panel 20 pathogen, COVID/flu/RSV negative -Echocardiogram shows EF of 55%, dilated aortic root but no obvious vegetation.  Given patient's frailty and advanced comorbidity, we can hold off on TEE at this time and monitor repeat cultures  COPD -Not an active exacerbation.  Bronchodilators  Acute kidney injury -Baseline creatinine 0.9, admission creatinine 3.91.  Continue IV fluids.  HIV -Home p.o. medications  Essential hypertension - Blood pressure slowly improving.  Will slowly resume home BP meds as appropriate.  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  Patient remains DNR/DNI with comfort measures.  Only excepts antibiotics and IV fluids if indicated otherwise no feeding tube.  Palliative care team consulted by PCCM team  Failed speech and swallow eval. severe aspirations noted on MBS.  DVT prophylaxis: SQ Hep    Code  Status: Limited: Do not attempt resuscitation (DNR) -DNR-LIMITED -Do Not Intubate/DNI  Family Communication: Called family, no answer Status is: Inpatient Remains inpatient appropriate because: Ongoing management for septic shock secondary to pneumonia   PT Follow up Recs:   Subjective: Seen at bedside awake.  Still having poor oral intake.   Examination:  General exam: Appears calm and comfortable, chronically ill-appearing Respiratory system: Clear to auscultation. Respiratory effort normal. Cardiovascular system: S1 & S2 heard, RRR. No JVD, murmurs, rubs, gallops or clicks. No pedal edema. Gastrointestinal system: Abdomen is nondistended, soft and nontender. No organomegaly or masses felt. Normal bowel sounds heard. Central nervous system: Alert and oriented. No focal neurological deficits. Extremities: Symmetric 5 x 5 power. Skin: No rashes, lesions or ulcers Psychiatry: Judgement and insight appear poor Foley in place            Wound 10/18/24 0400 Pressure Injury Sacrum Bilateral Stage 1 -  Intact skin with non-blanchable redness of a localized area usually over a bony prominence. (Active)     Diet Orders (From admission, onward)     Start     Ordered   10/18/24 0045  Diet NPO time specified  Diet effective now        10/18/24 0045            Objective: Vitals:   10/20/24 0732 10/20/24 0733 10/20/24 0734 10/20/24 0800  BP:   (!) 134/55 116/76  Pulse:   61 68  Resp:   19 (!) 21  Temp:      TempSrc:      SpO2: 100% 100% 100% 100%  Weight:      Height:  Intake/Output Summary (Last 24 hours) at 10/20/2024 1105 Last data filed at 10/20/2024 0600 Gross per 24 hour  Intake 2302.82 ml  Output 1350 ml  Net 952.82 ml   Filed Weights   10/18/24 0400 10/19/24 0420 10/20/24 0500  Weight: 60.6 kg 50.7 kg 49.7 kg    Scheduled Meds:  abacavir -dolutegravir -lamiVUDine   1 tablet Oral Daily   arformoterol   15 mcg Nebulization BID   budesonide   (PULMICORT ) nebulizer solution  0.25 mg Nebulization BID   Chlorhexidine  Gluconate Cloth  6 each Topical QHS   clopidogrel   75 mg Oral Daily   dutasteride   0.5 mg Oral Daily   ezetimibe   10 mg Oral Daily   heparin   5,000 Units Subcutaneous Q8H   insulin  aspart  0-9 Units Subcutaneous Q4H   loratadine   10 mg Oral Daily   revefenacin   175 mcg Nebulization Daily   sodium chloride  HYPERTONIC  4 mL Nebulization BID   tamsulosin   0.4 mg Oral Daily   Continuous Infusions:  sodium chloride  Stopped (10/20/24 0023)   ceFEPime  (MAXIPIME ) IV Stopped (10/19/24 2121)   linezolid  (ZYVOX ) IV 600 mg (10/20/24 0904)    Nutritional status     Body mass index is 17.16 kg/m.  Data Reviewed:   CBC: Recent Labs  Lab 10/17/24 2009 10/18/24 0148  WBC 18.7* 15.2*  NEUTROABS 17.4*  --   HGB 13.9 12.0*  HCT 41.5 34.7*  MCV 96.7 95.1  PLT 100* 78*   Basic Metabolic Panel: Recent Labs  Lab 10/17/24 2009 10/18/24 0148 10/19/24 0844 10/20/24 0258  NA 141 138 141 139  K 4.5 4.3 4.1 3.6  CL 103 105 111 108  CO2 17* 17* 22 23  GLUCOSE 131* 99 78 87  BUN 48* 47* 21 12  CREATININE 3.91* 3.29* 0.84 0.76  CALCIUM  9.0 8.3* 8.3* 7.7*  MG  --  1.4* 2.4 2.3  PHOS  --  4.6 2.2*  --    GFR: Estimated Creatinine Clearance: 49.2 mL/min (by C-G formula based on SCr of 0.76 mg/dL). Liver Function Tests: Recent Labs  Lab 10/17/24 2009  AST 86*  ALT 21  ALKPHOS 54  BILITOT 1.2  PROT 6.6  ALBUMIN 3.4*   No results for input(s): LIPASE, AMYLASE in the last 168 hours. No results for input(s): AMMONIA in the last 168 hours. Coagulation Profile: Recent Labs  Lab 10/17/24 2009  INR 1.4*   Cardiac Enzymes: No results for input(s): CKTOTAL, CKMB, CKMBINDEX, TROPONINI in the last 168 hours. BNP (last 3 results) No results for input(s): PROBNP in the last 8760 hours. HbA1C: No results for input(s): HGBA1C in the last 72 hours. CBG: Recent Labs  Lab 10/19/24 1615  10/19/24 2022 10/20/24 0001 10/20/24 0404 10/20/24 0716  GLUCAP 90 93 141* 94 92   Lipid Profile: No results for input(s): CHOL, HDL, LDLCALC, TRIG, CHOLHDL, LDLDIRECT in the last 72 hours. Thyroid Function Tests: No results for input(s): TSH, T4TOTAL, FREET4, T3FREE, THYROIDAB in the last 72 hours. Anemia Panel: No results for input(s): VITAMINB12, FOLATE, FERRITIN, TIBC, IRON, RETICCTPCT in the last 72 hours. Sepsis Labs: Recent Labs  Lab 10/17/24 2222 10/18/24 0140 10/18/24 0449 10/18/24 0826  LATICACIDVEN 8.0* 4.7* 2.7* 1.6    Recent Results (from the past 240 hours)  Resp panel by RT-PCR (RSV, Flu A&B, Covid) Anterior Nasal Swab     Status: None   Collection Time: 10/17/24  8:16 PM   Specimen: Anterior Nasal Swab  Result Value Ref Range Status   SARS Coronavirus  2 by RT PCR NEGATIVE NEGATIVE Final   Influenza A by PCR NEGATIVE NEGATIVE Final   Influenza B by PCR NEGATIVE NEGATIVE Final    Comment: (NOTE) The Xpert Xpress SARS-CoV-2/FLU/RSV plus assay is intended as an aid in the diagnosis of influenza from Nasopharyngeal swab specimens and should not be used as a sole basis for treatment. Nasal washings and aspirates are unacceptable for Xpert Xpress SARS-CoV-2/FLU/RSV testing.  Fact Sheet for Patients: bloggercourse.com  Fact Sheet for Healthcare Providers: seriousbroker.it  This test is not yet approved or cleared by the [place]  FDA and has been authorized for detection and/or diagnosis of SARS-CoV-2 by FDA under an Emergency Use Authorization (EUA). This EUA will remain in effect (meaning this test can be used) for the duration of the COVID-19 declaration under Section 564(b)(1) of the Act, 21 U.S.C. section 360bbb-3(b)(1), unless the authorization is terminated or revoked.     Resp Syncytial Virus by PCR NEGATIVE NEGATIVE Final    Comment: (NOTE) Fact Sheet for  Patients: bloggercourse.com  Fact Sheet for Healthcare Providers: seriousbroker.it  This test is not yet approved or cleared by the [place]  FDA and has been authorized for detection and/or diagnosis of SARS-CoV-2 by FDA under an Emergency Use Authorization (EUA). This EUA will remain in effect (meaning this test can be used) for the duration of the COVID-19 declaration under Section 564(b)(1) of the Act, 21 U.S.C. section 360bbb-3(b)(1), unless the authorization is terminated or revoked.  Performed at [facility], [address], [place], [place] [address]   Blood Culture (routine x 2)     Status: None (Preliminary result)   Collection Time: 10/17/24  8:16 PM   Specimen: BLOOD RIGHT FOREARM  Result Value Ref Range Status   Specimen Description BLOOD RIGHT FOREARM  Final   Special Requests   Final    BOTTLES DRAWN AEROBIC ONLY Blood Culture results may not be optimal due to an inadequate volume of blood received in culture bottles   Culture  Setup Time   Final    GRAM POSITIVE COCCI AEROBIC BOTTLE ONLY CRITICAL VALUE NOTED.  VALUE IS CONSISTENT WITH PREVIOUSLY REPORTED AND CALLED VALUE.    Culture   Final    GRAM POSITIVE COCCI IDENTIFICATION TO FOLLOW Performed at [facility], [address] [place], [place] [address]    Report Status PENDING  Incomplete  Blood Culture (routine x 2)     Status: None (Preliminary result)   Collection Time: 10/17/24  8:21 PM   Specimen: BLOOD  Result Value Ref Range Status   Specimen Description BLOOD RIGHT ANTECUBITAL  Final   Special Requests   Final    BOTTLES DRAWN AEROBIC AND ANAEROBIC Blood Culture adequate volume   Culture  Setup Time   Final    GRAM POSITIVE COCCI IN BOTH AEROBIC AND ANAEROBIC BOTTLES CRITICAL RESULT CALLED TO, READ BACK BY AND VERIFIED WITH: [facility]  PHARMD 10/19/2024 @ 0335 BY DD    Culture   Final    GRAM POSITIVE COCCI IDENTIFICATION TO  FOLLOW Performed at [facility], [address] [place], [place] [address]    Report Status PENDING  Incomplete  Blood Culture ID Panel (Reflexed)     Status: None   Collection Time: 10/17/24  8:21 PM  Result Value Ref Range Status   Enterococcus faecalis NOT DETECTED NOT DETECTED Final   Enterococcus Faecium NOT DETECTED NOT DETECTED Final   Listeria monocytogenes NOT DETECTED NOT DETECTED Final  Staphylococcus species NOT DETECTED NOT DETECTED Final   Staphylococcus aureus (BCID) NOT DETECTED NOT DETECTED Final   Staphylococcus epidermidis NOT DETECTED NOT DETECTED Final   Staphylococcus lugdunensis NOT DETECTED NOT DETECTED Final   Streptococcus species NOT DETECTED NOT DETECTED Final   Streptococcus agalactiae NOT DETECTED NOT DETECTED Final   Streptococcus pneumoniae NOT DETECTED NOT DETECTED Final   Streptococcus pyogenes NOT DETECTED NOT DETECTED Final   A.calcoaceticus-baumannii NOT DETECTED NOT DETECTED Final   Bacteroides fragilis NOT DETECTED NOT DETECTED Final   Enterobacterales NOT DETECTED NOT DETECTED Final   Enterobacter cloacae complex NOT DETECTED NOT DETECTED Final   Escherichia coli NOT DETECTED NOT DETECTED Final   Klebsiella aerogenes NOT DETECTED NOT DETECTED Final   Klebsiella oxytoca NOT DETECTED NOT DETECTED Final   Klebsiella pneumoniae NOT DETECTED NOT DETECTED Final   Proteus species NOT DETECTED NOT DETECTED Final   Salmonella species NOT DETECTED NOT DETECTED Final   Serratia marcescens NOT DETECTED NOT DETECTED Final   Haemophilus influenzae NOT DETECTED NOT DETECTED Final   Neisseria meningitidis NOT DETECTED NOT DETECTED Final   Pseudomonas aeruginosa NOT DETECTED NOT DETECTED Final   Stenotrophomonas maltophilia NOT DETECTED NOT DETECTED Final   Candida albicans NOT DETECTED NOT DETECTED Final   Candida auris NOT DETECTED NOT DETECTED Final   Candida glabrata NOT DETECTED NOT DETECTED Final   Candida krusei NOT DETECTED NOT DETECTED  Final   Candida parapsilosis NOT DETECTED NOT DETECTED Final   Candida tropicalis NOT DETECTED NOT DETECTED Final   Cryptococcus neoformans/gattii NOT DETECTED NOT DETECTED Final    Comment: Performed at [facility], [address], [place], [place] [address]  MRSA Next Gen by PCR, Nasal     Status: None   Collection Time: 10/18/24 12:44 AM   Specimen: Nasal Mucosa; Nasal Swab  Result Value Ref Range Status   MRSA by PCR Next Gen NOT DETECTED NOT DETECTED Final    Comment: (NOTE) The GeneXpert MRSA Assay (FDA approved for NASAL specimens only), is one component of a comprehensive MRSA colonization surveillance program. It is not intended to diagnose MRSA infection nor to guide or monitor treatment for MRSA infections. Test performance is not FDA approved in patients less than 51 years old. Performed at [facility], [address], [place], [place] [address]   Urine Culture     Status: Abnormal   Collection Time: 10/18/24 12:55 AM   Specimen: Urine, Random  Result Value Ref Range Status   Specimen Description URINE, RANDOM  Final   Special Requests   Final    NONE Reflexed from [id number] Performed at [facility], [address] [place], [place] [address]    Culture MULTIPLE SPECIES PRESENT, SUGGEST RECOLLECTION (A)  Final   Report Status 10/19/2024 FINAL  Final  Respiratory (~20 pathogens) panel by PCR     Status: None   Collection Time: 10/18/24  9:59 AM   Specimen: Nasopharyngeal Swab; Respiratory  Result Value Ref Range Status   Adenovirus NOT DETECTED NOT DETECTED Final   Coronavirus 229E NOT DETECTED NOT DETECTED Final    Comment: (NOTE) The Coronavirus on the Respiratory Panel, DOES NOT test for the novel  Coronavirus (2019 nCoV)    Coronavirus HKU1 NOT DETECTED NOT DETECTED Final   Coronavirus NL63 NOT DETECTED NOT DETECTED Final   Coronavirus OC43 NOT DETECTED NOT DETECTED Final   Metapneumovirus NOT DETECTED NOT DETECTED Final   Rhinovirus /  Enterovirus NOT DETECTED NOT DETECTED Final  Influenza A NOT DETECTED NOT DETECTED Final   Influenza B NOT DETECTED NOT DETECTED Final   Parainfluenza Virus 1 NOT DETECTED NOT DETECTED Final   Parainfluenza Virus 2 NOT DETECTED NOT DETECTED Final   Parainfluenza Virus 3 NOT DETECTED NOT DETECTED Final   Parainfluenza Virus 4 NOT DETECTED NOT DETECTED Final   Respiratory Syncytial Virus NOT DETECTED NOT DETECTED Final   Bordetella pertussis NOT DETECTED NOT DETECTED Final   Bordetella Parapertussis NOT DETECTED NOT DETECTED Final   Chlamydophila pneumoniae NOT DETECTED NOT DETECTED Final   Mycoplasma pneumoniae NOT DETECTED NOT DETECTED Final    Comment: Performed at [facility], [address], [place], [place] [address]         Radiology Studies: DG Swallowing Func-Speech Pathology Result Date: 10/19/2024 Table formatting from the original result was not included. Modified Barium Swallow Study Patient Details Name: Brian [name] MRN: [id number] Date of Birth: 22-Dec-1940 Today's Date: 10/19/2024 HPI/PMH: HPI: Inioluwa Baris is an 83 y.o. male who presented to the hospital from SNF on 10/18/24 with productive cough and SOB. He was initially hypoxic in ED but this improved to 99% on RA. CXR showed left lower lobe and left middle area infiltrates. He was made NPO awaiting SLP evaluation. and admitted with sepsis from PNA, hypotension, AKI. PMH: COPD, HIV, CVA with right upper extremity weakness, dysphagia (2023 MBS reported trace silent aspiration of nectar thick and thin liquids which was eliminated when patient taking small sips.) Clinical Impression: Clinical Impression: Paiton Fosco presents with a severe oropharyngeal dysphagia in addition to a suspected esophageal dysphagia. (DIGEST score: 3-severe) and compared to images from 2023 MBS, it does appear that his swallow function has declined. Silent aspiration (PAS 8) occured with thin and nectar thick liquids and sensed aspiration only  occured with gross aspiration of thin liquids. Laryngeal vestibule closure was incomplete and mistimed, leading to aspiration prior to and during swallow. Smaller sips as well as chin tuck posture did reduce but not eliminate the amount and frequency of aspiration; small sips of nectar thick liquids prevented aspiration and In addition, at end of study, patient indicated that he felt sick and started dry heaving. He proceeded to vomit clear-yellow liquid and barium contrast into emesis bag. The combination of his severe dysphagia, acute PNA, SNF report of overall deterioration in function all place him at a high risk for developing bacterial aspiration pneumonia. In order to maximize his airway protection, SLP recommends PO diet pudding thick liquids/puree solids. Factors that may increase risk of adverse event in presence of aspiration Noe & Lianne 2021): Factors that may increase risk of adverse event in presence of aspiration Noe & Lianne 2021): Poor general health and/or compromised immunity; Frail or deconditioned; Limited mobility; Inadequate oral hygiene; Aspiration of thick, dense, and/or acidic materials DIGEST Swallow Severity Rating*  Safety: 3  Efficiency:1  Overall Pharyngeal Swallow Severity: 3 1: mild; 2: moderate; 3: severe; 4: profound *The Dynamic Imaging Grade of Swallowing Toxicity is standardized for the head and neck cancer population, however, demonstrates promising clinical applications across populations to standardize the clinical rating of pharyngeal swallow safety and severity. Recommendations/Plan: Swallowing Evaluation Recommendations Swallowing Evaluation Recommendations Recommendations: PO diet PO Diet Recommendation: Dysphagia 1 (Pureed); Extremely thick liquids (Level 4, pudding thick) Liquid Administration via: Spoon Medication Administration: Crushed with puree Supervision: Full assist for feeding Swallowing strategies  : Slow rate; Small bites/sips Postural changes:  Position pt fully upright for meals; Stay upright 30-60 min after meals Oral care  recommendations: Oral care BID (2x/day) Treatment Plan Treatment Plan Treatment recommendations: Therapy as outlined in treatment plan below Follow-up recommendations: Follow physicians's recommendations for discharge plan and follow up therapies Functional status assessment: Patient has had a recent decline in their functional status and demonstrates the ability to make significant improvements in function in a reasonable and predictable amount of time. Treatment frequency: Min 2x/week Treatment duration: 1 week Interventions: Patient/family education; Trials of upgraded texture/liquids; Diet toleration management by SLP Recommendations Recommendations for follow up therapy are one component of a multi-disciplinary discharge planning process, led by the attending physician.  Recommendations may be updated based on patient status, additional functional criteria and insurance authorization. Assessment: Orofacial Exam: Orofacial Exam Oral Cavity: Oral Hygiene: Dried secretions; Xerostomia Oral Cavity - Dentition: Poor condition Orofacial Anatomy: WFL Anatomy: No data recorded Boluses Administered: Boluses Administered Boluses Administered: Thin liquids (Level 0); Mildly thick liquids (Level 2, nectar thick); Moderately thick liquids (Level 3, honey thick); Puree; Solid  Oral Impairment Domain: Oral Impairment Domain Lip Closure: No labial escape Tongue control during bolus hold: Posterior escape of less than half of bolus Bolus preparation/mastication: Slow prolonged chewing/mashing with complete recollection Bolus transport/lingual motion: Brisk tongue motion Oral residue: Residue collection on oral structures Location of oral residue : Tongue Initiation of pharyngeal swallow : Pyriform sinuses  Pharyngeal Impairment Domain: Pharyngeal Impairment Domain Soft palate elevation: No bolus between soft palate (SP)/pharyngeal wall (PW)  Laryngeal elevation: Complete superior movement of thyroid cartilage with complete approximation of arytenoids to epiglottic petiole Anterior hyoid excursion: Complete anterior movement Epiglottic movement: Complete inversion Laryngeal vestibule closure: Incomplete, narrow column air/contrast in laryngeal vestibule Pharyngeal stripping wave : Present - diminished Pharyngeal contraction (A/P view only): N/A Pharyngoesophageal segment opening: Complete distension and complete duration, no obstruction of flow Tongue base retraction: No contrast between tongue base and posterior pharyngeal wall (PPW) Pharyngeal residue: Collection of residue within or on pharyngeal structures Location of pharyngeal residue: Valleculae; Pyriform sinuses; Aryepiglottic folds  Esophageal Impairment Domain: Esophageal Impairment Domain Esophageal clearance upright position: Esophageal retention Pill: Pill Consistency administered: Thin liquids (Level 0) Thin liquids (Level 0): [facility] Penetration/Aspiration Scale Score: Penetration/Aspiration Scale Score 1.  Material does not enter airway: Puree; Solid; Pill 2.  Material enters airway, remains ABOVE vocal cords then ejected out: Moderately thick liquids (Level 3, honey thick) 5.  Material enters airway, CONTACTS cords and not ejected out: Thin liquids (Level 0); Mildly thick liquids (Level 2, nectar thick) 7.  Material enters airway, passes BELOW cords and not ejected out despite cough attempt by patient: Thin liquids (Level 0) 8.  Material enters airway, passes BELOW cords without attempt by patient to eject out (silent aspiration) : Thin liquids (Level 0); Mildly thick liquids (Level 2, nectar thick) Compensatory Strategies: Compensatory Strategies Compensatory strategies: Yes Chin tuck: Effective Effective Chin Tuck: Thin liquid (Level 0)   General Information: No data recorded Diet Prior to this Study: NPO   No data recorded  No data recorded  No data recorded  No data recorded  Behavior/Cognition: Alert; Cooperative; Pleasant mood No data recorded No data recorded No data recorded No data recorded Exam Limitations: No limitations Goal Planning: Prognosis for improved oropharyngeal function: Guarded Barriers to Reach Goals: Time post onset No data recorded Patient/Family Stated Goal: no family present, no stated goals per patient Consulted and agree with results and recommendations: Pt unable/family or caregiver not available Pain: Pain Assessment Pain Assessment: Faces Faces Pain Scale: 4 Breathing: 0 Negative Vocalization: 0 Facial Expression: 0  Body Language: 0 Consolability: 0 PAINAD Score: 0 Facial Expression: 0 Body Movements: 0 Muscle Tension: 0 Compliance with ventilator (intubated pts.): N/A Vocalization (extubated pts.): 0 CPOT Total: 0 Pain Location: throat after a few swallows of barium Pain Descriptors / Indicators: Discomfort; Grimacing; Sore Pain Intervention(s): Monitored during session End of Session: Start Time:SLP Start Time (ACUTE ONLY): 1125 Stop Time: SLP Stop Time (ACUTE ONLY): 1148 Time Calculation:SLP Time Calculation (min) (ACUTE ONLY): 23 min Charges: SLP Evaluations $ SLP Speech Visit: 1 Visit SLP Evaluations $BSS Swallow: 1 Procedure $MBS Swallow: 1 Procedure SLP visit diagnosis: SLP Visit Diagnosis: Dysphagia, oropharyngeal phase (R13.12) Past Medical History: Past Medical History: Diagnosis Date  Arthritis   Asthma   Bradycardia   Depression   Enlarged prostate   HIV (human immunodeficiency virus infection) (HCC)   Hyperlipidemia   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  Stroke [facility])  Past Surgical History: Past Surgical History: Procedure Laterality Date  cataracts    EYE SURGERY    PACEMAKER INSERTION N/A 08/02/2015  Procedure: INSERTION PACEMAKER;  Surgeon: Sheralyn Flock, MD;  Location: [name];  Service: Cardiovascular;  Laterality: N/A; Norleen IVAR Blase, MA, CCC-SLP Speech Therapy 10/19/2024, 3:42 PM  ECHOCARDIOGRAM COMPLETE Result Date: 10/19/2024     ECHOCARDIOGRAM REPORT   Patient Name:   Brian [name] Date of Exam: 10/19/2024 Medical Rec #:  [id number]    Height:       67.0 in Accession #:    [id number]   Weight:       111.8 lb Date of Birth:  04-May-1941     BSA:          1.579 m Patient Age:    83 years     BP:           145/73 mmHg Patient Gender: M            HR:           57 bpm. Exam Location:  Inpatient Procedure: 2D Echo, Cardiac Doppler and Color Doppler (Both Spectral and Color            Flow Doppler were utilized during procedure). Indications:    Bacteremia R78.81  History:        Patient has prior history of Echocardiogram examinations, most                 recent 04/25/2022. Previous Myocardial Infarction, Stroke; Risk                 Factors:Dyslipidemia.  Sonographer:    Tinnie Gosling RDCS Referring Phys: [id number] BURGESS BROCKS Daily Crate IMPRESSIONS  1. Left ventricular ejection fraction, by estimation, is 50 to 55%. The left ventricle has low normal function. The left ventricle has no regional wall motion abnormalities. There is mild concentric left ventricular hypertrophy. Left ventricular diastolic parameters are consistent with Grade II diastolic dysfunction (pseudonormalization).  2. Right ventricular systolic function is normal. The right ventricular size is normal.  3. There is no evidence of cardiac tamponade.  4. The mitral valve is normal in structure. Trivial mitral valve regurgitation. No evidence of mitral stenosis.  5. The aortic valve is normal in structure. There is mild calcification of the aortic valve. Aortic valve regurgitation is mild. No aortic stenosis is present.  6. Aortic dilatation noted. 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  7. The inferior vena cava is normal in size with greater than 50% respiratory  variability, suggesting right atrial pressure of 3 mmHg. Conclusion(s)/Recommendation(s): No obvious vegetations seen on chest wall echo. The  aortic valve is mildly calcified but I do not see a frank vegetation. If clinical suspicion for endocarditis is high, would recommned TEE. FINDINGS  Left Ventricle: Left ventricular ejection fraction, by estimation, is 50 to 55%. The left ventricle has low normal function. The left ventricle has no regional wall motion abnormalities. The left ventricular internal cavity size was normal in size. There is mild concentric left ventricular hypertrophy. Left ventricular diastolic parameters are consistent with Grade II diastolic dysfunction (pseudonormalization). Right Ventricle: The right ventricular size is normal. No increase in right ventricular wall thickness. Right ventricular systolic function is normal. Left Atrium: Left atrial size was normal in size. Right Atrium: Right atrial size was normal in size. Pericardium: Trivial pericardial effusion is present. The pericardial effusion is anterior to the right ventricle. There is no evidence of cardiac tamponade. Mitral Valve: The mitral valve is normal in structure. Trivial mitral valve regurgitation. No evidence of mitral valve stenosis. Tricuspid Valve: The tricuspid valve is normal in structure. Tricuspid valve regurgitation is mild . No evidence of tricuspid stenosis. Aortic Valve: The aortic valve is normal in structure. There is mild calcification of the aortic valve. Aortic valve regurgitation is mild. No aortic stenosis is present. Pulmonic Valve: The pulmonic valve was normal in structure. Pulmonic valve regurgitation is trivial. No evidence of pulmonic stenosis. Aorta: Aortic dilatation noted. 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 Venous: The inferior vena cava is normal in size with greater than 50% respiratory variability, suggesting right atrial pressure of 3 mmHg. IAS/Shunts: No atrial level shunt detected by color flow Doppler.  LEFT VENTRICLE PLAX 2D LVIDd:          5.00 cm     Diastology LVIDs:         3.50 cm     LV e' medial:    4.03 cm/s LV PW:         1.20 cm     LV E/e' medial:  16.5 LV IVS:        1.20 cm     LV e' lateral:   3.81 cm/s LVOT diam:     2.00 cm     LV E/e' lateral: 17.4 LV SV:         53 LV SV Index:   34 LVOT Area:     3.14 cm  LV Volumes (MOD) LV vol d, MOD A2C: 60.9 ml LV vol d, MOD A4C: 77.7 ml LV vol s, MOD A2C: 29.6 ml LV vol s, MOD A4C: 33.5 ml LV SV MOD A2C:     31.3 ml LV SV MOD A4C:     77.7 ml LV SV MOD BP:      41.8 ml RIGHT VENTRICLE             IVC RV S prime:     10.90 cm/s  IVC diam: 1.90 cm TAPSE (M-mode): 2.0 cm LEFT ATRIUM           Index LA diam:      3.70 cm 2.34 cm/m LA Vol (A4C): 41.5 ml 26.28 ml/m  AORTIC VALVE LVOT Vmax:   67.90 cm/s LVOT Vmean:  47.800 cm/s LVOT VTI:    0.169 m  AORTA Ao Root diam: 4.00 cm Ao Asc diam:  4.00 cm MITRAL VALVE  TRICUSPID VALVE MV Area (PHT): 4.15 cm    TR Peak grad:   23.2 mmHg MV E velocity: 66.40 cm/s  TR Vmax:        241.00 cm/s MV A velocity: 69.80 cm/s MV E/A ratio:  0.95        SHUNTS                            Systemic VTI:  0.17 m                            Systemic Diam: 2.00 cm Toribio Fuel MD Electronically signed by Toribio Fuel MD Signature Date/Time: 10/19/2024/11:06:07 AM    Final            LOS: 2 days   Time spent= 35 mins    Burgess JAYSON Dare, MD [facility]  If 7PM-7AM, please contact night-coverage  10/20/2024, 11:05 AM  "

## 2024-10-20 NOTE — Consult Note (Addendum)
 "        [facility]    Date of Admission:  10/17/2024   Total days of inpatient antibiotics 3        Reason for Consult: E faecalis bacteremia    Principal Problem:   Severe sepsis (HCC) Active Problems:   Urinary retention   Pneumonia of left lung due to infectious organism   Pressure injury of skin   Assessment: 83 year old male who is DNR/DNI on comfort past medical history of HIV on Triumeq , CVA with right upper extremity weakness, COPD presenting difficulty swallowing.  Found to be hypotensive, family okay with vasopressors.  Found to have #E faecalis bacteremia #Urinary retention -Chest x-ray showed left mid and lower lung pneumonia on 12/21. Initially thought to have septic shock secondary to aspiration versus whole care associated pneumoniahe was started on vancomycin  and cefepime .  Blood cultures from admission grew 2 out of 2 sets E faecalis.  Urine cultures multiple species. - He has BPH with urinary retention, Foley catheter placed by urology with outpatient follow-up. #HIV - On Triumeq  -  CD4 270 on 10/18/24 -will get VL Recommendations:  - D/c , cefepime , linezolid  -Continue vancomycin  and start unasyn  - TTE showed no vegetation with aortic valve mildly calcified, will hold off on TEE pending goals of care - Follow repeat blood cultures - Standard precautions Microbiology:   Antibiotics: Vancomycin  12/21, 12/24-,  Cefepime  12/21- Linezolid  12/23-  Cultures: Blood 12/21 2/2 sets e faecalis 12/23 Urine 12/21 multiple species Other   HPI: Brian [name] is a 83 y.o. male who was DNR/DNI and comfort with past medical history hypertension, HIV on Triumeq  , CVA with right upper extremity weakness, COPD and chronic difficulty swallowing presented with productive cough.  He was hypotensive although sat 99% on room air.  Started on pressors.  ID engaged as blood cultures grew E faecalis.   Review of Systems: Review of Systems  All  other systems reviewed and are negative.   Past Medical History:  Diagnosis Date   Arthritis    Asthma    Bradycardia    Depression    Enlarged prostate    HIV (human immunodeficiency virus infection) (HCC)    Hyperlipidemia    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   Stroke [facility])     Social History[1]  Family History  Problem Relation Age of Onset   Heart disease Brother    Hypertension Other    Scheduled Meds:  abacavir -dolutegravir -lamiVUDine   1 tablet Oral Daily   arformoterol   15 mcg Nebulization BID   budesonide  (PULMICORT ) nebulizer solution  0.25 mg Nebulization BID   Chlorhexidine  Gluconate Cloth  6 each Topical QHS   clopidogrel   75 mg Oral Daily   dutasteride   0.5 mg Oral Daily   ezetimibe   10 mg Oral Daily   heparin   5,000 Units Subcutaneous Q8H   insulin  aspart  0-9 Units Subcutaneous Q4H   loratadine   10 mg Oral Daily   revefenacin   175 mcg Nebulization Daily   sodium chloride  HYPERTONIC  4 mL Nebulization BID   tamsulosin   0.4 mg Oral Daily   Continuous Infusions:  sodium chloride  Stopped (10/20/24 0023)   ampicillin -sulbactam (UNASYN ) IV 3 g (10/20/24 1842)   [START ON 10/21/2024] vancomycin      PRN Meds:.sodium chloride , acetaminophen , dextrose , glucagon  (human recombinant), guaiFENesin , hydrALAZINE , ipratropium-albuterol , metoprolol  tartrate, ondansetron  (ZOFRAN ) IV, senna-docusate Allergies[2]  OBJECTIVE: Blood pressure 132/75, pulse 78, temperature 98.1 F (36.7 C), resp. rate 20, height 5' 7 (1.702 m), weight  49.7 kg, SpO2 96%.  Physical Exam Constitutional:      General: He is not in acute distress.    Appearance: He is normal weight. He is not toxic-appearing.  HENT:     Head: Normocephalic and atraumatic.     Right Ear: External ear normal.     Left Ear: External ear normal.     Nose: No congestion or rhinorrhea.     Mouth/Throat:     Mouth: Mucous membranes are moist.     Pharynx: Oropharynx is clear.  Eyes:     Extraocular Movements:  Extraocular movements intact.     Conjunctiva/sclera: Conjunctivae normal.     Pupils: Pupils are equal, round, and reactive to light.  Cardiovascular:     Rate and Rhythm: Normal rate and regular rhythm.     Heart sounds: No murmur heard.    No friction rub. No gallop.  Pulmonary:     Effort: Pulmonary effort is normal.     Breath sounds: Normal breath sounds.  Abdominal:     General: Abdomen is flat. Bowel sounds are normal.     Palpations: Abdomen is soft.  Musculoskeletal:        General: No swelling. Normal range of motion.     Cervical back: Normal range of motion and neck supple.  Skin:    General: Skin is warm and dry.  Neurological:     General: No focal deficit present.     Mental Status: He is oriented to person, place, and time.  Psychiatric:        Mood and Affect: Mood normal.     Lab Results Lab Results  Component Value Date   WBC 15.2 (H) 10/18/2024   HGB 12.0 (L) 10/18/2024   HCT 34.7 (L) 10/18/2024   MCV 95.1 10/18/2024   PLT 78 (L) 10/18/2024    Lab Results  Component Value Date   CREATININE 0.76 10/20/2024   BUN 12 10/20/2024   NA 139 10/20/2024   K 3.6 10/20/2024   CL 108 10/20/2024   CO2 23 10/20/2024    Lab Results  Component Value Date   ALT 21 10/17/2024   AST 86 (H) 10/17/2024   ALKPHOS 54 10/17/2024   BILITOT 1.2 10/17/2024       Brian Stank, MD [facility] 10/20/2024, 11:15 PM Evaluation of this patient requires complex antimicrobial therapy evaluation and counseling + isolation needs for disease transmission risk assessment and mitigation      [1]  Social History Tobacco Use   Smoking status: Former    Types: Cigarettes   Smokeless tobacco: Never  Vaping Use   Vaping status: Never Used  Substance Use Topics   Alcohol  use: Yes    Alcohol /week: 4.0 standard drinks of alcohol     Types: 4 Cans of beer per week   Drug use: No  [2] No Known Allergies  "

## 2024-10-21 DIAGNOSIS — A419 Sepsis, unspecified organism: Secondary | ICD-10-CM | POA: Diagnosis not present

## 2024-10-21 DIAGNOSIS — R6520 Severe sepsis without septic shock: Secondary | ICD-10-CM | POA: Diagnosis not present

## 2024-10-21 LAB — GLUCOSE, CAPILLARY
Glucose-Capillary: 95 mg/dL (ref 70–99)
Glucose-Capillary: 101 mg/dL — ABNORMAL HIGH (ref 70–99)
Glucose-Capillary: 92 mg/dL (ref 70–99)
Glucose-Capillary: 95 mg/dL (ref 70–99)

## 2024-10-21 LAB — CULTURE, BLOOD (ROUTINE X 2)

## 2024-10-21 LAB — CBC
HCT: 35.4 % — ABNORMAL LOW (ref 39.0–52.0)
Hemoglobin: 12.6 g/dL — ABNORMAL LOW (ref 13.0–17.0)
MCH: 32.7 pg (ref 26.0–34.0)
MCHC: 35.6 g/dL (ref 30.0–36.0)
MCV: 91.9 fL (ref 80.0–100.0)
Platelets: 101 K/uL — ABNORMAL LOW (ref 150–400)
RBC: 3.85 MIL/uL — ABNORMAL LOW (ref 4.22–5.81)
RDW: 13.1 % (ref 11.5–15.5)
WBC: 7.4 K/uL (ref 4.0–10.5)
nRBC: 0 % (ref 0.0–0.2)

## 2024-10-21 MED ORDER — SODIUM CHLORIDE 0.9 % IV SOLN: INTRAVENOUS | Status: AC

## 2024-10-21 NOTE — Progress Notes (Signed)
 " PROGRESS NOTE    Brian [name]  FMW:[id number] DOB: 1941-03-01 DOA: 10/17/2024 PCP: [name], MD    Brief Narrative:  83 year old Spanish-speaking male from SNF [facility] DNR/DNI with past medical history of HTN, HIV on [place], CVA with right upper extremity weakness, COPD, chronic dysphagia presented with shortness of breath and cough.  Patient found to have sepsis secondary to pneumonia started on broad-spectrum antibiotics.  Patient was admitted to the ICU initially as he required pressors.   Assessment & Plan:  Septic shock secondary to aspiration versus healthcare acquired pneumonia E faecalis bacteremia - Chest x-ray showing left-sided infiltrate.  Initially required Levophed  now this has been off. - Respiratory panel 20 pathogen, COVID/flu/RSV negative -Echocardiogram shows EF of 55%, dilated aortic root but no obvious vegetation.   -Initial cultures grew E faecalis bacteremia, repeat currently remains unremarkable - ID is following, antibiotics adjusted to vancomycin  and Unasyn  -Called family no answer, next steps for possible TEE depends on GOC.   COPD -Not an active exacerbation.  Bronchodilators  Acute kidney injury: Resolved.  -Baseline creatinine 0.9, admission creatinine 3.91.  Continue IV fluids.  HIV -Home p.o. medications  Essential hypertension - Blood pressure slowly improving.  Will slowly resume home BP meds as appropriate.  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  Patient remains DNR/DNI with comfort measures.  Only excepts antibiotics and IV fluids if indicated otherwise no feeding tube.  Palliative care team consulted by PCCM team  Failed speech and swallow eval. severe aspirations noted on MBS.  DVT  prophylaxis: SQ Hep    Code Status: Limited: Do not attempt resuscitation (DNR) -DNR-LIMITED -Do Not Intubate/DNI  Family Communication: Called family, no answer Status is: Inpatient Remains inpatient appropriate because: Going treatment for Enterobacter faecalis bacteremia.  Ongoing safe disposition planning   PT Follow up Recs:   Subjective: Seen at bedside awake.  Still having poor oral intake.  Called family in the morning, no answer  Examination:  General exam: Appears calm and comfortable, chronically ill-appearing Respiratory system: Clear to auscultation. Respiratory effort normal. Cardiovascular system: S1 & S2 heard, RRR. No JVD, murmurs, rubs, gallops or clicks. No pedal edema. Gastrointestinal system: Abdomen is nondistended, soft and nontender. No organomegaly or masses felt. Normal bowel sounds heard. Central nervous system: Alert and oriented. No focal neurological deficits. Extremities: Symmetric 5 x 5 power. Skin: No rashes, lesions or ulcers Psychiatry: Judgement and insight appear poor Foley in place            Wound 10/18/24 0400 Pressure Injury Sacrum Bilateral Stage 1 -  Intact skin with non-blanchable redness of a localized area usually over a bony prominence. (Active)     Diet Orders (From admission, onward)     Start     Ordered   10/20/24 1445  DIET - DYS 1 Room service appropriate? Yes; Fluid consistency: Pudding Thick  Diet effective now       Question Answer Comment  Room service appropriate? Yes   Fluid consistency: Pudding Thick      10/20/24 1445            Objective: Vitals:   10/20/24 2148 10/20/24 2340 10/21/24 0504 10/21/24 0859  BP:  (!) 150/68 (!) 156/82   Pulse:  63 83   Resp:  20 20   Temp:  98 F (36.7 C) 98.3 F (36.8 C)   TempSrc:      SpO2: 96% 99% 95% 94%  Weight:      Height:        Intake/Output Summary (Last 24 hours) at 10/21/2024 1034 Last data filed at 10/21/2024 0530 Gross per 24 hour  Intake  429.84 ml  Output 3925 ml  Net -3495.16 ml   Filed Weights   10/18/24 0400 10/19/24 0420 10/20/24 0500  Weight: 60.6 kg 50.7 kg 49.7 kg    Scheduled Meds:  abacavir -dolutegravir -lamiVUDine   1 tablet Oral Daily   arformoterol   15 mcg Nebulization BID   budesonide  (PULMICORT ) nebulizer solution  0.25 mg Nebulization BID   Chlorhexidine  Gluconate Cloth  6 each Topical QHS   clopidogrel   75 mg Oral Daily   dutasteride   0.5 mg Oral Daily   ezetimibe   10 mg Oral Daily   heparin   5,000 Units Subcutaneous Q8H   insulin  aspart  0-9 Units Subcutaneous Q4H   loratadine   10 mg Oral Daily   revefenacin   175 mcg Nebulization Daily   tamsulosin   0.4 mg Oral Daily   Continuous Infusions:  sodium chloride  Stopped (10/20/24 0023)   sodium chloride  50 mL/hr at 10/21/24 0908   ampicillin -sulbactam (UNASYN ) IV 3 g (10/21/24 0831)   vancomycin       Nutritional status     Body mass index is 17.16 kg/m.  Data Reviewed:   CBC: Recent Labs  Lab 10/17/24 2009 10/18/24 0148 10/21/24 0524  WBC 18.7* 15.2* 7.4  NEUTROABS 17.4*  --   --   HGB 13.9 12.0* 12.6*  HCT 41.5 34.7* 35.4*  MCV 96.7 95.1 91.9  PLT 100* 78* 101*   Basic Metabolic Panel: Recent Labs  Lab 10/17/24 2009 10/18/24 0148 10/19/24 0844 10/20/24 0258  NA 141 138 141 139  K 4.5 4.3 4.1 3.6  CL 103 105 111 108  CO2 17* 17* 22 23  GLUCOSE 131* 99 78 87  BUN 48* 47* 21 12  CREATININE 3.91* 3.29* 0.84 0.76  CALCIUM  9.0 8.3* 8.3* 7.7*  MG  --  1.4* 2.4 2.3  PHOS  --  4.6 2.2*  --    GFR: Estimated Creatinine Clearance: 49.2 mL/min (by C-G formula based on SCr of 0.76 mg/dL). Liver Function Tests: Recent Labs  Lab 10/17/24 2009  AST 86*  ALT 21  ALKPHOS 54  BILITOT 1.2  PROT 6.6  ALBUMIN 3.4*   No results for input(s): LIPASE, AMYLASE in the last 168 hours. No results for input(s): AMMONIA in the last 168 hours. Coagulation Profile: Recent Labs  Lab 10/17/24 2009  INR 1.4*   Cardiac  Enzymes: No results for input(s): CKTOTAL, CKMB, CKMBINDEX, TROPONINI in the last 168 hours. BNP (last 3 results) No results for input(s): PROBNP in the last 8760 hours. HbA1C: No results for input(s): HGBA1C in the last 72 hours. CBG: Recent Labs  Lab 10/20/24 0716 10/20/24 1152 10/20/24 1630 10/20/24 2116 10/21/24 0758  GLUCAP 92 116* 110* 110* 95   Lipid Profile: No results for input(s): CHOL, HDL, LDLCALC, TRIG, CHOLHDL, LDLDIRECT in the last 72 hours. Thyroid Function Tests: No results for input(s): TSH, T4TOTAL, FREET4, T3FREE, THYROIDAB in the last 72 hours. Anemia Panel: No results for input(s): VITAMINB12, FOLATE, FERRITIN, TIBC, IRON, RETICCTPCT in the last 72 hours. Sepsis Labs: Recent Labs  Lab 10/17/24 2222 10/18/24 0140 10/18/24 0449 10/18/24 0826  LATICACIDVEN 8.0* 4.7*  2.7* 1.6    Recent Results (from the past 240 hours)  Resp panel by RT-PCR (RSV, Flu A&B, Covid) Anterior Nasal Swab     Status: None   Collection Time: 10/17/24  8:16 PM   Specimen: Anterior Nasal Swab  Result Value Ref Range Status   SARS Coronavirus 2 by RT PCR NEGATIVE NEGATIVE Final   Influenza A by PCR NEGATIVE NEGATIVE Final   Influenza B by PCR NEGATIVE NEGATIVE Final    Comment: (NOTE) The Xpert Xpress SARS-CoV-2/FLU/RSV plus assay is intended as an aid in the diagnosis of influenza from Nasopharyngeal swab specimens and should not be used as a sole basis for treatment. Nasal washings and aspirates are unacceptable for Xpert Xpress SARS-CoV-2/FLU/RSV testing.  Fact Sheet for Patients: bloggercourse.com  Fact Sheet for Healthcare Providers: seriousbroker.it  This test is not yet approved or cleared by the [place]  FDA and has been authorized for detection and/or diagnosis of SARS-CoV-2 by FDA under an Emergency Use Authorization (EUA). This EUA will remain in effect (meaning  this test can be used) for the duration of the COVID-19 declaration under Section 564(b)(1) of the Act, 21 U.S.C. section 360bbb-3(b)(1), unless the authorization is terminated or revoked.     Resp Syncytial Virus by PCR NEGATIVE NEGATIVE Final    Comment: (NOTE) Fact Sheet for Patients: bloggercourse.com  Fact Sheet for Healthcare Providers: seriousbroker.it  This test is not yet approved or cleared by the [place]  FDA and has been authorized for detection and/or diagnosis of SARS-CoV-2 by FDA under an Emergency Use Authorization (EUA). This EUA will remain in effect (meaning this test can be used) for the duration of the COVID-19 declaration under Section 564(b)(1) of the Act, 21 U.S.C. section 360bbb-3(b)(1), unless the authorization is terminated or revoked.  Performed at [facility], [address] [place], [place] [address]   Blood Culture (routine x 2)     Status: Abnormal   Collection Time: 10/17/24  8:16 PM   Specimen: BLOOD RIGHT FOREARM  Result Value Ref Range Status   Specimen Description BLOOD RIGHT FOREARM  Final   Special Requests   Final    BOTTLES DRAWN AEROBIC ONLY Blood Culture results may not be optimal due to an inadequate volume of blood received in culture bottles   Culture  Setup Time   Final    GRAM POSITIVE COCCI AEROBIC BOTTLE ONLY CRITICAL VALUE NOTED.  VALUE IS CONSISTENT WITH PREVIOUSLY REPORTED AND CALLED VALUE.    Culture (A)  Final    AEROCOCCUS URINAE Standardized susceptibility testing for this organism is not available. Performed at [facility], [address] [place], [place] [address]    Report Status 10/21/2024 FINAL  Final  Blood Culture (routine x 2)     Status: Abnormal (Preliminary result)   Collection Time: 10/17/24  8:21 PM   Specimen: BLOOD  Result Value Ref Range Status   Specimen Description BLOOD RIGHT ANTECUBITAL  Final   Special Requests   Final     BOTTLES DRAWN AEROBIC AND ANAEROBIC Blood Culture adequate volume   Culture  Setup Time   Final    GRAM POSITIVE COCCI IN BOTH AEROBIC AND ANAEROBIC BOTTLES CRITICAL RESULT CALLED TO, READ BACK BY AND VERIFIED WITH: [facility]  PHARMD 10/19/2024 @ 0335 BY DD    Culture (A)  Final    AEROCOCCUS URINAE ENTEROCOCCUS FAECALIS REISOLATING ENTEROCOCCUS FAECALIS CRITICAL RESULT CALLED TO, READ BACK BY AND VERIFIED WITH: MAYA Karleen MATSU [phone] fcp  Performed at [facility], [address], [place], [place] [address]    Report Status PENDING  Incomplete  Blood Culture ID Panel (Reflexed)     Status: None   Collection Time: 10/17/24  8:21 PM  Result Value Ref Range Status   Enterococcus faecalis NOT DETECTED NOT DETECTED Final   Enterococcus Faecium NOT DETECTED NOT DETECTED Final   Listeria monocytogenes NOT DETECTED NOT DETECTED Final   Staphylococcus species NOT DETECTED NOT DETECTED Final   Staphylococcus aureus (BCID) NOT DETECTED NOT DETECTED Final   Staphylococcus epidermidis NOT DETECTED NOT DETECTED Final   Staphylococcus lugdunensis NOT DETECTED NOT DETECTED Final   Streptococcus species NOT DETECTED NOT DETECTED Final   Streptococcus agalactiae NOT DETECTED NOT DETECTED Final   Streptococcus pneumoniae NOT DETECTED NOT DETECTED Final   Streptococcus pyogenes NOT DETECTED NOT DETECTED Final   A.calcoaceticus-baumannii NOT DETECTED NOT DETECTED Final   Bacteroides fragilis NOT DETECTED NOT DETECTED Final   Enterobacterales NOT DETECTED NOT DETECTED Final   Enterobacter cloacae complex NOT DETECTED NOT DETECTED Final   Escherichia coli NOT DETECTED NOT DETECTED Final   Klebsiella aerogenes NOT DETECTED NOT DETECTED Final   Klebsiella oxytoca NOT DETECTED NOT DETECTED Final   Klebsiella pneumoniae NOT DETECTED NOT DETECTED Final   Proteus species NOT DETECTED NOT DETECTED Final   Salmonella species NOT DETECTED NOT DETECTED Final   Serratia marcescens NOT DETECTED NOT  DETECTED Final   Haemophilus influenzae NOT DETECTED NOT DETECTED Final   Neisseria meningitidis NOT DETECTED NOT DETECTED Final   Pseudomonas aeruginosa NOT DETECTED NOT DETECTED Final   Stenotrophomonas maltophilia NOT DETECTED NOT DETECTED Final   Candida albicans NOT DETECTED NOT DETECTED Final   Candida auris NOT DETECTED NOT DETECTED Final   Candida glabrata NOT DETECTED NOT DETECTED Final   Candida krusei NOT DETECTED NOT DETECTED Final   Candida parapsilosis NOT DETECTED NOT DETECTED Final   Candida tropicalis NOT DETECTED NOT DETECTED Final   Cryptococcus neoformans/gattii NOT DETECTED NOT DETECTED Final    Comment: Performed at [facility], [address], [place], [place] [address]  MRSA Next Gen by PCR, Nasal     Status: None   Collection Time: 10/18/24 12:44 AM   Specimen: Nasal Mucosa; Nasal Swab  Result Value Ref Range Status   MRSA by PCR Next Gen NOT DETECTED NOT DETECTED Final    Comment: (NOTE) The GeneXpert MRSA Assay (FDA approved for NASAL specimens only), is one component of a comprehensive MRSA colonization surveillance program. It is not intended to diagnose MRSA infection nor to guide or monitor treatment for MRSA infections. Test performance is not FDA approved in patients less than 35 years old. Performed at [facility], [address], [place], [place] [address]   Urine Culture     Status: Abnormal   Collection Time: 10/18/24 12:55 AM   Specimen: Urine, Random  Result Value Ref Range Status   Specimen Description URINE, RANDOM  Final   Special Requests   Final    NONE Reflexed from [id number] Performed at [facility], [address], [place], [place] [address]    Culture MULTIPLE SPECIES PRESENT, SUGGEST RECOLLECTION (A)  Final   Report Status 10/19/2024 FINAL  Final  Respiratory (~20 pathogens) panel by PCR     Status: None   Collection Time: 10/18/24  9:59 AM   Specimen: Nasopharyngeal Swab; Respiratory  Result Value Ref Range  Status   Adenovirus NOT DETECTED NOT DETECTED Final  Coronavirus 229E NOT DETECTED NOT DETECTED Final    Comment: (NOTE) The Coronavirus on the Respiratory Panel, DOES NOT test for the novel  Coronavirus (2019 nCoV)    Coronavirus HKU1 NOT DETECTED NOT DETECTED Final   Coronavirus NL63 NOT DETECTED NOT DETECTED Final   Coronavirus OC43 NOT DETECTED NOT DETECTED Final   Metapneumovirus NOT DETECTED NOT DETECTED Final   Rhinovirus / Enterovirus NOT DETECTED NOT DETECTED Final   Influenza A NOT DETECTED NOT DETECTED Final   Influenza B NOT DETECTED NOT DETECTED Final   Parainfluenza Virus 1 NOT DETECTED NOT DETECTED Final   Parainfluenza Virus 2 NOT DETECTED NOT DETECTED Final   Parainfluenza Virus 3 NOT DETECTED NOT DETECTED Final   Parainfluenza Virus 4 NOT DETECTED NOT DETECTED Final   Respiratory Syncytial Virus NOT DETECTED NOT DETECTED Final   Bordetella pertussis NOT DETECTED NOT DETECTED Final   Bordetella Parapertussis NOT DETECTED NOT DETECTED Final   Chlamydophila pneumoniae NOT DETECTED NOT DETECTED Final   Mycoplasma pneumoniae NOT DETECTED NOT DETECTED Final    Comment: Performed at [facility], [address], [place], [place] [address]  Culture, blood (Routine X 2) w Reflex to ID Panel     Status: None (Preliminary result)   Collection Time: 10/19/24  8:44 AM   Specimen: BLOOD  Result Value Ref Range Status   Specimen Description   Final    BLOOD BLOOD LEFT HAND AEROBIC BOTTLE ONLY ANAEROBIC BOTTLE ONLY Performed at [facility], [address] [place], [place] [address]    Special Requests   Final    BOTTLES DRAWN AEROBIC AND ANAEROBIC Blood Culture results may not be optimal due to an inadequate volume of blood received in culture bottles Performed at [facility], [address], [place], [place] [address]    Culture   Final    NO GROWTH < 24 HOURS Performed at [facility], [address] [place], [place]  [address]    Report Status PENDING  Incomplete  Culture, blood (Routine X 2) w Reflex to ID Panel     Status: None (Preliminary result)   Collection Time: 10/19/24  8:46 AM   Specimen: BLOOD  Result Value Ref Range Status   Specimen Description   Final    BLOOD BLOOD LEFT HAND AEROBIC BOTTLE ONLY ANAEROBIC BOTTLE ONLY Performed at [facility], [address], [place], [place] [address]    Special Requests   Final    BOTTLES DRAWN AEROBIC AND ANAEROBIC Blood Culture results may not be optimal due to an inadequate volume of blood received in culture bottles Performed at [facility], [address] [place], [place] [address]    Culture   Final    NO GROWTH < 24 HOURS Performed at [facility], [address] [place], [place] [address]    Report Status PENDING  Incomplete         Radiology Studies: DG Swallowing Func-Speech Pathology Result Date: 10/19/2024 Table formatting from the original result was not included. Modified Barium Swallow Study Patient Details Name: Brian [name] MRN: [id number] Date of Birth: July 25, 1941 Today's Date: 10/19/2024 HPI/PMH: HPI: Dolph Tavano is an 83 y.o. male who presented to the hospital from SNF on 10/18/24 with productive cough and SOB. He was initially hypoxic in ED but this improved to 99% on RA. CXR showed left lower lobe and left middle area infiltrates. He was made NPO awaiting SLP evaluation. and  admitted with sepsis from PNA, hypotension, AKI. PMH: COPD, HIV, CVA with right upper extremity weakness, dysphagia (2023 MBS reported trace silent aspiration of nectar thick and thin liquids which was eliminated when patient taking small sips.) Clinical Impression: Clinical Impression: Calven Gilkes presents with a severe oropharyngeal dysphagia in addition to a suspected esophageal dysphagia. (DIGEST score: 3-severe) and compared to images from 2023 MBS, it does appear that his swallow function has declined. Silent  aspiration (PAS 8) occured with thin and nectar thick liquids and sensed aspiration only occured with gross aspiration of thin liquids. Laryngeal vestibule closure was incomplete and mistimed, leading to aspiration prior to and during swallow. Smaller sips as well as chin tuck posture did reduce but not eliminate the amount and frequency of aspiration; small sips of nectar thick liquids prevented aspiration and In addition, at end of study, patient indicated that he felt sick and started dry heaving. He proceeded to vomit clear-yellow liquid and barium contrast into emesis bag. The combination of his severe dysphagia, acute PNA, SNF report of overall deterioration in function all place him at a high risk for developing bacterial aspiration pneumonia. In order to maximize his airway protection, SLP recommends PO diet pudding thick liquids/puree solids. Factors that may increase risk of adverse event in presence of aspiration Noe & Lianne 2021): Factors that may increase risk of adverse event in presence of aspiration Noe & Lianne 2021): Poor general health and/or compromised immunity; Frail or deconditioned; Limited mobility; Inadequate oral hygiene; Aspiration of thick, dense, and/or acidic materials DIGEST Swallow Severity Rating*  Safety: 3  Efficiency:1  Overall Pharyngeal Swallow Severity: 3 1: mild; 2: moderate; 3: severe; 4: profound *The Dynamic Imaging Grade of Swallowing Toxicity is standardized for the head and neck cancer population, however, demonstrates promising clinical applications across populations to standardize the clinical rating of pharyngeal swallow safety and severity. Recommendations/Plan: Swallowing Evaluation Recommendations Swallowing Evaluation Recommendations Recommendations: PO diet PO Diet Recommendation: Dysphagia 1 (Pureed); Extremely thick liquids (Level 4, pudding thick) Liquid Administration via: Spoon Medication Administration: Crushed with puree Supervision: Full  assist for feeding Swallowing strategies  : Slow rate; Small bites/sips Postural changes: Position pt fully upright for meals; Stay upright 30-60 min after meals Oral care recommendations: Oral care BID (2x/day) Treatment Plan Treatment Plan Treatment recommendations: Therapy as outlined in treatment plan below Follow-up recommendations: Follow physicians's recommendations for discharge plan and follow up therapies Functional status assessment: Patient has had a recent decline in their functional status and demonstrates the ability to make significant improvements in function in a reasonable and predictable amount of time. Treatment frequency: Min 2x/week Treatment duration: 1 week Interventions: Patient/family education; Trials of upgraded texture/liquids; Diet toleration management by SLP Recommendations Recommendations for follow up therapy are one component of a multi-disciplinary discharge planning process, led by the attending physician.  Recommendations may be updated based on patient status, additional functional criteria and insurance authorization. Assessment: Orofacial Exam: Orofacial Exam Oral Cavity: Oral Hygiene: Dried secretions; Xerostomia Oral Cavity - Dentition: Poor condition Orofacial Anatomy: WFL Anatomy: No data recorded Boluses Administered: Boluses Administered Boluses Administered: Thin liquids (Level 0); Mildly thick liquids (Level 2, nectar thick); Moderately thick liquids (Level 3, honey thick); Puree; Solid  Oral Impairment Domain: Oral Impairment Domain Lip Closure: No labial escape Tongue control during bolus hold: Posterior escape of less than half of bolus Bolus preparation/mastication: Slow prolonged chewing/mashing with complete recollection Bolus transport/lingual motion: Brisk tongue motion Oral residue: Residue collection on oral structures Location of oral residue :  Tongue Initiation of pharyngeal swallow : Pyriform sinuses  Pharyngeal Impairment Domain: Pharyngeal Impairment  Domain Soft palate elevation: No bolus between soft palate (SP)/pharyngeal wall (PW) Laryngeal elevation: Complete superior movement of thyroid cartilage with complete approximation of arytenoids to epiglottic petiole Anterior hyoid excursion: Complete anterior movement Epiglottic movement: Complete inversion Laryngeal vestibule closure: Incomplete, narrow column air/contrast in laryngeal vestibule Pharyngeal stripping wave : Present - diminished Pharyngeal contraction (A/P view only): N/A Pharyngoesophageal segment opening: Complete distension and complete duration, no obstruction of flow Tongue base retraction: No contrast between tongue base and posterior pharyngeal wall (PPW) Pharyngeal residue: Collection of residue within or on pharyngeal structures Location of pharyngeal residue: Valleculae; Pyriform sinuses; Aryepiglottic folds  Esophageal Impairment Domain: Esophageal Impairment Domain Esophageal clearance upright position: Esophageal retention Pill: Pill Consistency administered: Thin liquids (Level 0) Thin liquids (Level 0): [facility] Penetration/Aspiration Scale Score: Penetration/Aspiration Scale Score 1.  Material does not enter airway: Puree; Solid; Pill 2.  Material enters airway, remains ABOVE vocal cords then ejected out: Moderately thick liquids (Level 3, honey thick) 5.  Material enters airway, CONTACTS cords and not ejected out: Thin liquids (Level 0); Mildly thick liquids (Level 2, nectar thick) 7.  Material enters airway, passes BELOW cords and not ejected out despite cough attempt by patient: Thin liquids (Level 0) 8.  Material enters airway, passes BELOW cords without attempt by patient to eject out (silent aspiration) : Thin liquids (Level 0); Mildly thick liquids (Level 2, nectar thick) Compensatory Strategies: Compensatory Strategies Compensatory strategies: Yes Chin tuck: Effective Effective Chin Tuck: Thin liquid (Level 0)   General Information: No data recorded Diet Prior to this Study: NPO    No data recorded  No data recorded  No data recorded  No data recorded Behavior/Cognition: Alert; Cooperative; Pleasant mood No data recorded No data recorded No data recorded No data recorded Exam Limitations: No limitations Goal Planning: Prognosis for improved oropharyngeal function: Guarded Barriers to Reach Goals: Time post onset No data recorded Patient/Family Stated Goal: no family present, no stated goals per patient Consulted and agree with results and recommendations: Pt unable/family or caregiver not available Pain: Pain Assessment Pain Assessment: Faces Faces Pain Scale: 4 Breathing: 0 Negative Vocalization: 0 Facial Expression: 0 Body Language: 0 Consolability: 0 PAINAD Score: 0 Facial Expression: 0 Body Movements: 0 Muscle Tension: 0 Compliance with ventilator (intubated pts.): N/A Vocalization (extubated pts.): 0 CPOT Total: 0 Pain Location: throat after a few swallows of barium Pain Descriptors / Indicators: Discomfort; Grimacing; Sore Pain Intervention(s): Monitored during session End of Session: Start Time:SLP Start Time (ACUTE ONLY): 1125 Stop Time: SLP Stop Time (ACUTE ONLY): 1148 Time Calculation:SLP Time Calculation (min) (ACUTE ONLY): 23 min Charges: SLP Evaluations $ SLP Speech Visit: 1 Visit SLP Evaluations $BSS Swallow: 1 Procedure $MBS Swallow: 1 Procedure SLP visit diagnosis: SLP Visit Diagnosis: Dysphagia, oropharyngeal phase (R13.12) Past Medical History: Past Medical History: Diagnosis Date  Arthritis   Asthma   Bradycardia   Depression   Enlarged prostate   HIV (human immunodeficiency virus infection) (HCC)   Hyperlipidemia   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  Stroke [facility])  Past Surgical History: Past Surgical History: Procedure Laterality Date  cataracts    EYE SURGERY    PACEMAKER INSERTION N/A 08/02/2015  Procedure: INSERTION PACEMAKER;  Surgeon: Sheralyn Flock, MD;  Location: [name];  Service: Cardiovascular;  Laterality: N/A; Norleen IVAR Blase, MA, CCC-SLP Speech Therapy 10/19/2024,  3:42 PM          LOS: 3 days   Time  spent= 35 mins    Burgess JAYSON Dare, MD [facility]  If 7PM-7AM, please contact night-coverage  10/21/2024, 10:34 AM  "

## 2024-10-22 DIAGNOSIS — A419 Sepsis, unspecified organism: Secondary | ICD-10-CM | POA: Diagnosis not present

## 2024-10-22 DIAGNOSIS — N401 Enlarged prostate with lower urinary tract symptoms: Secondary | ICD-10-CM

## 2024-10-22 DIAGNOSIS — B952 Enterococcus as the cause of diseases classified elsewhere: Secondary | ICD-10-CM | POA: Diagnosis not present

## 2024-10-22 DIAGNOSIS — R338 Other retention of urine: Secondary | ICD-10-CM | POA: Diagnosis not present

## 2024-10-22 DIAGNOSIS — R6520 Severe sepsis without septic shock: Secondary | ICD-10-CM | POA: Diagnosis not present

## 2024-10-22 DIAGNOSIS — B20 Human immunodeficiency virus [HIV] disease: Secondary | ICD-10-CM | POA: Diagnosis not present

## 2024-10-22 LAB — GLUCOSE, CAPILLARY
Glucose-Capillary: 105 mg/dL — ABNORMAL HIGH (ref 70–99)
Glucose-Capillary: 125 mg/dL — ABNORMAL HIGH (ref 70–99)
Glucose-Capillary: 128 mg/dL — ABNORMAL HIGH (ref 70–99)
Glucose-Capillary: 86 mg/dL (ref 70–99)
Glucose-Capillary: 95 mg/dL (ref 70–99)

## 2024-10-22 LAB — HIV-1 RNA QUANT-NO REFLEX-BLD
HIV 1 RNA Quant: <20 {copies}/mL
LOG10 HIV-1 RNA: UNDETERMINED {Log_copies}/mL

## 2024-10-22 MED ORDER — ORAL CARE MOUTH RINSE
15.0000 mL | OROMUCOSAL | Status: DC
  Administered 2024-10-22 – 2024-10-26 (×12): 15 mL via OROMUCOSAL

## 2024-10-22 MED ORDER — SODIUM CHLORIDE 0.9 % IV SOLN: INTRAVENOUS | Status: AC

## 2024-10-22 MED ORDER — ORAL CARE MOUTH RINSE: 15.0000 mL | OROMUCOSAL | Status: DC | PRN

## 2024-10-22 NOTE — Consult Note (Signed)
 "                                                                                   Consultation Note Date: 10/22/2024   Patient Name: Brian [name]  DOB: [birth year]  MRN: [id number]  Age / Sex: 83 y.o., male  PCP: [name], MD Referring Physician: Caleen Burgess BROCKS, MD  Reason for Consultation: Establishing goals of care  HPI/Patient Profile: 83 y.o. male admitted on 10/17/2024 with  .   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 He presented with shortness of breath and cough and was found to be in septic shock secondary to pneumonia (aspiration versus healthcare-associated). He initially required ICU admission and vasopressor support, which has since been discontinued. Blood cultures grew Enterococcus faecalis bacteremia. Chest X-ray showed a left-sided infiltrate, respiratory viral testing was negative, and echocardiogram demonstrated preserved EF (55%) without evidence of vegetation. Antibiotics were adjusted per Infectious Disease to vancomycin  and Unasyn .  The hospital course has been complicated by severe dysphagia with failed speech and swallow evaluation and significant aspiration noted on modified barium swallow. Acute kidney injury present on admission has resolved. COPD is not in acute exacerbation. The patient remains DNR/DNI. A goals-of-care discussion was previously conducted by the [facility] team, and palliative care was consulted for ongoing support and guidance.  Palliative care met with the family. After discussion, the family expressed a preference for:    Not proceeding to pursue further invasive diagnostics, including TEE, and are agreeable to treatment with oral antibiotics only. They declined artificial nutrition, including feeding  tube placement. Pros cons risks outweighing benefits of artificial feeding discussed at length.  The plan is for comfort feeding with acceptance of aspiration risk.  Disposition planning includes return to [facility] with oral antibiotics, outpatient palliative care follow-up, and a continued focus on comfort-based goals aligned with the patients DNR/DNI status and with the goals to shift to hospice care in the upcoming weeks.   NEXT OF KIN  Daughter and son in law at bedside.   SUMMARY OF RECOMMENDATIONS    DNR DNI PO antibiotics No artificial feeding Comfort feeds accepting of aspiration risk Back to [facility] with PO Abx and outpatient palliative - consider switching from palliative to hospice service if ongoing decline after this hospitalization.  Thank you for the consult.   Code Status/Advance Care Planning: DNR   Symptom Management:     Palliative Prophylaxis:  Frequent Pain Assessment  Additional Recommendations (Limitations, Scope, Preferences): No Artificial Feeding  Psycho-social/Spiritual:  Desire for further Chaplaincy support:yes Additional Recommendations: Caregiving  Support/Resources  Prognosis:  < 12 months  Discharge Planning: [facility] with palliative.       Primary Diagnoses: Present on Admission:  Severe sepsis (HCC)   I have reviewed the medical record, interviewed the patient and family, and examined the patient. The following aspects are pertinent.  Past Medical History:  Diagnosis Date   Arthritis    Asthma    Bradycardia  Depression    Enlarged prostate    HIV (human immunodeficiency virus infection) (HCC)    Hyperlipidemia    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   Stroke [facility])    Social History   Socioeconomic History   Marital status: Widowed    Spouse name: Not on file   Number of children: Not on file   Years of education: Not on file   Highest education level: Not on file  Occupational History   Not on file   Tobacco Use   Smoking status: Former    Types: Cigarettes   Smokeless tobacco: Never  Vaping Use   Vaping status: Never Used  Substance and Sexual Activity   Alcohol  use: Yes    Alcohol /week: 4.0 standard drinks of alcohol     Types: 4 Cans of beer per week   Drug use: No   Sexual activity: Not on file  Other Topics Concern   Not on file  Social History Narrative   Not on file   Social Drivers of Health   Tobacco Use: Medium Risk (10/17/2024)   Patient History    Smoking Tobacco Use: Former    Smokeless Tobacco Use: Never    Passive Exposure: Not on file  Financial Resource Strain: Not on file  Food Insecurity: Patient Unable To Answer (11/29/2023)   Hunger Vital Sign    Worried About Running Out of Food in the Last Year: Patient unable to answer    Ran Out of Food in the Last Year: Patient unable to answer  Transportation Needs: Patient Unable To Answer (10/18/2024)   Epic    Lack of Transportation (Medical): Patient unable to answer    Lack of Transportation (Non-Medical): Patient unable to answer  Physical Activity: Not on file  Stress: Not on file  Social Connections: Patient Unable To Answer (11/29/2023)   Social Connection and Isolation Panel    Frequency of Communication with Friends and Family: Patient unable to answer    Frequency of Social Gatherings with Friends and Family: Patient unable to answer    Attends Religious Services: Patient unable to answer    Active Member of Clubs or Organizations: Patient unable to answer    Attends Banker Meetings: Patient unable to answer    Marital Status: Patient unable to answer  Depression (PHQ2-9): Low Risk (06/25/2022)   Depression (PHQ2-9)    PHQ-2 Score: 0  Alcohol  Screen: Not on file  Housing: Patient Unable To Answer (11/29/2023)   Housing Stability Vital Sign    Unable to Pay for Housing in the Last Year: Patient unable to answer    Number of Times Moved in the Last Year: Not on file    Homeless in the  Last Year: Patient unable to answer  Utilities: Patient Unable To Answer (11/29/2023)   [employer] Utilities    Threatened with loss of utilities: Patient unable to answer  Health Literacy: Not on file   Family History  Problem Relation Age of Onset   Heart disease Brother    Hypertension Other    Scheduled Meds:  abacavir -dolutegravir -lamiVUDine   1 tablet Oral Daily   arformoterol   15 mcg Nebulization BID   budesonide  (PULMICORT ) nebulizer solution  0.25 mg Nebulization BID   Chlorhexidine  Gluconate Cloth  6 each Topical QHS   clopidogrel   75 mg Oral Daily   dutasteride   0.5 mg Oral Daily   ezetimibe   10 mg Oral Daily   heparin   5,000 Units Subcutaneous Q8H   insulin  aspart  0-9 Units  Subcutaneous Q4H   loratadine   10 mg Oral Daily   revefenacin   175 mcg Nebulization Daily   tamsulosin   0.4 mg Oral Daily   Continuous Infusions:  sodium chloride  50 mL/hr at 10/22/24 1132   ampicillin -sulbactam (UNASYN ) IV 3 g (10/22/24 0706)   vancomycin  Stopped (10/21/24 1427)   PRN Meds:.acetaminophen , dextrose , glucagon  (human recombinant), guaiFENesin , hydrALAZINE , ipratropium-albuterol , metoprolol  tartrate, ondansetron  (ZOFRAN ) IV, senna-docusate Medications Prior to Admission:  Prior to Admission medications  Medication Sig Start Date End Date Taking? Authorizing Provider  abacavir -dolutegravir -lamiVUDine  (TRIUMEQ ) 600-50-300 MG tablet Take 1 tablet by mouth daily. 06/25/24  Yes Fayette Bodily, MD  alendronate  (FOSAMAX ) 70 MG tablet Take 70 mg by mouth once a week. Take on Saturday. 07/21/15  Yes [provider]  clopidogrel  (PLAVIX ) 75 MG tablet Take 75 mg by mouth daily. 05/12/23  Yes [provider]  dextromethorphan -guaiFENesin  (MUCINEX  DM) 30-600 MG 12hr tablet Take 1 tablet by mouth 2 (two) times daily as needed for cough. 09/27/24  Yes Caleen Qualia, MD  dutasteride  (AVODART ) 0.5 MG capsule Take 1 capsule (0.5 mg total) by mouth daily. 10/25/20  Yes Francisca Redell BROCKS, MD   ezetimibe  (ZETIA ) 10 MG tablet Take 10 mg by mouth daily. 06/25/22  Yes [provider]  lidocaine  4 % Place 1 patch onto the skin daily.   Yes [provider]  loratadine  (CLARITIN ) 10 MG tablet Take 10 mg by mouth daily.   Yes [provider]  losartan  (COZAAR ) 25 MG tablet Take 1 tablet (25 mg total) by mouth daily. 09/27/24  Yes Caleen Qualia, MD  meloxicam  (MOBIC ) 7.5 MG tablet Take 1 tablet (7.5 mg total) by mouth daily. 05/06/24  Yes Reddick, Johnathan B, NP  metoprolol  succinate (TOPROL -XL) 25 MG 24 hr tablet Take 1 tablet (25 mg total) by mouth daily. 09/27/24  Yes Amin, Sumayya, MD  polyethylene glycol (MIRALAX  / GLYCOLAX ) 17 g packet Take 17 g by mouth daily as needed. 09/27/24  Yes Caleen Qualia, MD  simethicone  (MYLICON) 80 MG chewable tablet Chew 1 tablet (80 mg total) by mouth every 6 (six) hours as needed for flatulence. 05/02/22  Yes Raenelle Coria, MD  tamsulosin  (FLOMAX ) 0.4 MG CAPS capsule Take 1 capsule (0.4 mg total) by mouth daily. 10/25/20  Yes Francisca Redell BROCKS, MD  VENTOLIN  HFA 108 (90 BASE) MCG/ACT inhaler Inhale 2 puffs into the lungs 4 (four) times daily as needed. For shortness of breath and/or wheezing. 07/21/15  Yes [provider]   Allergies[1] Review of Systems +weakness  Physical Exam Chronically ill appearing gentleman Appears weak and frail Regular work of breathing Awake alert  Vital Signs: BP 130/79 (BP Location: Right Arm)   Pulse 94   Temp 98.6 F (37 C) (Oral)   Resp 16   Ht 5' 7 (1.702 m)   Wt 59.5 kg   SpO2 94%   BMI 20.54 kg/m  Pain Scale: Faces   Pain Score: 0-No pain   SpO2: SpO2: 94 % O2 Device:SpO2: 94 % O2 Flow Rate: .O2 Flow Rate (L/min): 1 L/min  IO: Intake/output summary:  Intake/Output Summary (Last 24 hours) at 10/22/2024 1248 Last data filed at 10/22/2024 0600 Gross per 24 hour  Intake 1336.9 ml  Output 4050 ml  Net -2713.1 ml    LBM: Last BM Date : 10/17/24 Baseline Weight:  Weight: 59.4 kg (Wt from 09/21/2024) Most recent weight: Weight: 59.5 kg     Palliative Assessment/Data:   PPS 40%  Time In:  11 Time Out:  12.15 Time Total:  75 Greater than 50%  of this time was spent counseling and coordinating care related to the above assessment and plan.  Signed by: Lonia Serve, MD   Please contact Palliative Medicine Team phone at [phone] for questions and concerns.  For individual provider: See Amion                 [1] No Known Allergies  "

## 2024-10-22 NOTE — Plan of Care (Signed)
 SABRA

## 2024-10-22 NOTE — TOC Progression Note (Signed)
 Transition of Care [facility]) - Progression Note    Patient Details  Name: Brian [name] MRN: [id number] Date of Birth: November 02, 1940  [facility] of [facility]) CM/SW Contact  Maelle Sheaffer, Nathanel, RN Phone Number: 10/22/2024, 12:41 PM  Clinical Narrative:  Continue to monitor for d/c needs.     Expected Discharge Plan: Home/Self Care Barriers to Discharge: Continued Medical Work up               Expected Discharge Plan and Services In-house Referral: NA Discharge Planning Services: CM Consult Post Acute Care Choice: Durable Medical Equipment Living arrangements for the past 2 months: Single Family Home                 DME Arranged: N/A DME Agency: NA       HH Arranged: NA HH Agency: NA         Social Drivers of Health (SDOH) Interventions SDOH Screenings   Food Insecurity: Patient Unable To Answer (11/29/2023)  Housing: Patient Unable To Answer (11/29/2023)  Transportation Needs: Patient Unable To Answer (10/18/2024)  Utilities: Patient Unable To Answer (11/29/2023)  Depression (PHQ2-9): Low Risk (06/25/2022)  Social Connections: Patient Unable To Answer (11/29/2023)  Tobacco Use: Medium Risk (10/17/2024)    Readmission Risk Interventions    10/19/2024   10:47 AM  Readmission Risk Prevention Plan  Transportation Screening Complete  PCP or Specialist Appt within 5-7 Days Complete  Home Care Screening Complete  Medication Review (RN CM) Complete

## 2024-10-22 NOTE — Progress Notes (Signed)
 SLP Cancellation Note  Patient Details Name: Brian [name] MRN: [id number] DOB: 1941-05-03   Cancelled treatment:       Reason Eval/Treat Not Completed: Other (comment)  SLP spoke with RN and MD. MD and palliative care MD met with pt and family this morning. They have opted for comfort feedings (leaving in diet of purees, but allowign family to offer any POs accepting aspiration risk). Per MD, no further SLP interventions needed at this time, so will sign off.    Leita SAILOR., M.A. CCC-SLP Acute Rehabilitation Services Office: [phone]  Secure chat preferred  10/22/2024, 2:07 PM

## 2024-10-22 NOTE — Progress Notes (Signed)
 " PROGRESS NOTE    Brian [name]  FMW:[id number] DOB: 1941/08/10 DOA: 10/17/2024 PCP: Brian [name]., MD    Brief Narrative:  83 year old Spanish-speaking male from [facility] DNR/DNI with past medical history of HTN, HIV on [place], CVA with right upper extremity weakness, COPD, chronic dysphagia presented with shortness of breath and cough.  Patient found to have sepsis secondary to pneumonia started on broad-spectrum antibiotics.  Patient was admitted to the ICU initially as he required pressors.   Assessment & Plan:  Septic shock secondary to aspiration versus healthcare acquired pneumonia E faecalis bacteremia - Chest x-ray showing left-sided infiltrate.  Initially required Levophed  now this has been off. - Respiratory panel 20 pathogen, COVID/flu/RSV negative -Echocardiogram shows EF of 55%, dilated aortic root but no obvious vegetation.   -Initial cultures grew E faecalis bacteremia, repeat currently remains unremarkable - ID is following, antibiotics adjusted to vancomycin  and Unasyn  -Called family no answer, next steps for possible TEE depends on GOC.   COPD -Not an active exacerbation.  Bronchodilators  Acute kidney injury: Resolved.  -Baseline creatinine 0.9, admission creatinine 3.91.  Continue IV fluids.  HIV -Home p.o. medications  Essential hypertension - Blood pressure slowly improving.  Will slowly resume home BP meds as appropriate.  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  Patient remains DNR/DNI with comfort measures.  Only excepts antibiotics and IV fluids if indicated otherwise no feeding tube.  Palliative care team consulted by PCCM team  Failed speech and swallow eval. severe aspirations noted on MBS.  DVT  prophylaxis: SQ Hep    Code Status: Limited: Do not attempt resuscitation (DNR) -DNR-LIMITED -Do Not Intubate/DNI  Family Communication: Called family, no answer Status is: Inpatient Remains inpatient appropriate because: Going treatment for Enterobacter faecalis bacteremia.  Ongoing safe disposition planning   PT Follow up Recs:   Subjective: Awake at bedside.  Met with the family at bedside along with Dr Jeryl from palliative. They would like patient to only take PO Abx, no TEE. Plans for him to go home to [place] with PO Abx, outptn palliative follow up and comfort feeding unless if they would follow dietary recs  Examination:  General exam: Appears calm and comfortable, chronically ill-appearing Respiratory system: Clear to auscultation. Respiratory effort normal. Cardiovascular system: S1 & S2 heard, RRR. No JVD, murmurs, rubs, gallops or clicks. No pedal edema. Gastrointestinal system: Abdomen is nondistended, soft and nontender. No organomegaly or masses felt. Normal bowel sounds heard. Central nervous system: Alert and oriented. No focal neurological deficits. Extremities: Symmetric 5 x 5 power. Skin: No rashes, lesions or ulcers Psychiatry: Judgement and insight appear poor Foley in place            Wound 10/18/24 0400 Pressure Injury Sacrum Bilateral Stage 1 -  Intact skin with non-blanchable redness of a localized area usually over a bony prominence. (Active)     Diet Orders (From admission, onward)     Start     Ordered   10/20/24 1445  DIET - DYS 1 Room service appropriate? Yes; Fluid consistency: Pudding Thick  Diet effective now       Question Answer Comment  Room service appropriate? Yes   Fluid consistency: Pudding Thick      10/20/24  1445            Objective: Vitals:   10/21/24 2030 10/22/24 0500 10/22/24 0531 10/22/24 0748  BP: (!) 153/63  130/79   Pulse: 76  94   Resp: 18  16   Temp: 99.6 F (37.6 C)  98.6 F (37 C)   TempSrc: Oral   Oral   SpO2: 99%  97% 94%  Weight:  59.5 kg    Height:        Intake/Output Summary (Last 24 hours) at 10/22/2024 1205 Last data filed at 10/22/2024 0600 Gross per 24 hour  Intake 1336.9 ml  Output 4050 ml  Net -2713.1 ml   Filed Weights   10/19/24 0420 10/20/24 0500 10/22/24 0500  Weight: 50.7 kg 49.7 kg 59.5 kg    Scheduled Meds:  abacavir -dolutegravir -lamiVUDine   1 tablet Oral Daily   arformoterol   15 mcg Nebulization BID   budesonide  (PULMICORT ) nebulizer solution  0.25 mg Nebulization BID   Chlorhexidine  Gluconate Cloth  6 each Topical QHS   clopidogrel   75 mg Oral Daily   dutasteride   0.5 mg Oral Daily   ezetimibe   10 mg Oral Daily   heparin   5,000 Units Subcutaneous Q8H   insulin  aspart  0-9 Units Subcutaneous Q4H   loratadine   10 mg Oral Daily   revefenacin   175 mcg Nebulization Daily   tamsulosin   0.4 mg Oral Daily   Continuous Infusions:  sodium chloride  50 mL/hr at 10/22/24 1132   ampicillin -sulbactam (UNASYN ) IV 3 g (10/22/24 0706)   vancomycin  Stopped (10/21/24 1427)    Nutritional status     Body mass index is 20.54 kg/m.  Data Reviewed:   CBC: Recent Labs  Lab 10/17/24 2009 10/18/24 0148 10/21/24 0524  WBC 18.7* 15.2* 7.4  NEUTROABS 17.4*  --   --   HGB 13.9 12.0* 12.6*  HCT 41.5 34.7* 35.4*  MCV 96.7 95.1 91.9  PLT 100* 78* 101*   Basic Metabolic Panel: Recent Labs  Lab 10/17/24 2009 10/18/24 0148 10/19/24 0844 10/20/24 0258  NA 141 138 141 139  K 4.5 4.3 4.1 3.6  CL 103 105 111 108  CO2 17* 17* 22 23  GLUCOSE 131* 99 78 87  BUN 48* 47* 21 12  CREATININE 3.91* 3.29* 0.84 0.76  CALCIUM  9.0 8.3* 8.3* 7.7*  MG  --  1.4* 2.4 2.3  PHOS  --  4.6 2.2*  --    GFR: Estimated Creatinine Clearance: 58.9 mL/min (by C-G formula based on SCr of 0.76 mg/dL). Liver Function Tests: Recent Labs  Lab 10/17/24 2009  AST 86*  ALT 21  ALKPHOS 54  BILITOT 1.2  PROT 6.6  ALBUMIN 3.4*   No results for input(s): LIPASE, AMYLASE in  the last 168 hours. No results for input(s): AMMONIA in the last 168 hours. Coagulation Profile: Recent Labs  Lab 10/17/24 2009  INR 1.4*   Cardiac Enzymes: No results for input(s): CKTOTAL, CKMB, CKMBINDEX, TROPONINI in the last 168 hours. BNP (last 3 results) No results for input(s): PROBNP in the last 8760 hours. HbA1C: No results for input(s): HGBA1C in the last 72 hours. CBG: Recent Labs  Lab 10/21/24 1214 10/21/24 1721 10/21/24 2032 10/22/24 0737 10/22/24 1116  GLUCAP 101* 92 95 95 86   Lipid Profile: No results for input(s): CHOL, HDL, LDLCALC, TRIG, CHOLHDL, LDLDIRECT in the last 72 hours. Thyroid Function Tests: No results for input(s): TSH, T4TOTAL, FREET4, T3FREE, THYROIDAB in the last 72 hours. Anemia Panel: No results for input(s):  VITAMINB12, FOLATE, FERRITIN, TIBC, IRON, RETICCTPCT in the last 72 hours. Sepsis Labs: Recent Labs  Lab 10/17/24 2222 10/18/24 0140 10/18/24 0449 10/18/24 0826  LATICACIDVEN 8.0* 4.7* 2.7* 1.6    Recent Results (from the past 240 hours)  Resp panel by RT-PCR (RSV, Flu A&B, Covid) Anterior Nasal Swab     Status: None   Collection Time: 10/17/24  8:16 PM   Specimen: Anterior Nasal Swab  Result Value Ref Range Status   SARS Coronavirus 2 by RT PCR NEGATIVE NEGATIVE Final   Influenza A by PCR NEGATIVE NEGATIVE Final   Influenza B by PCR NEGATIVE NEGATIVE Final    Comment: (NOTE) The Xpert Xpress SARS-CoV-2/FLU/RSV plus assay is intended as an aid in the diagnosis of influenza from Nasopharyngeal swab specimens and should not be used as a sole basis for treatment. Nasal washings and aspirates are unacceptable for Xpert Xpress SARS-CoV-2/FLU/RSV testing.  Fact Sheet for Patients: bloggercourse.com  Fact Sheet for Healthcare Providers: seriousbroker.it  This test is not yet approved or cleared by the [place]  FDA and has  been authorized for detection and/or diagnosis of SARS-CoV-2 by FDA under an Emergency Use Authorization (EUA). This EUA will remain in effect (meaning this test can be used) for the duration of the COVID-19 declaration under Section 564(b)(1) of the Act, 21 U.S.C. section 360bbb-3(b)(1), unless the authorization is terminated or revoked.     Resp Syncytial Virus by PCR NEGATIVE NEGATIVE Final    Comment: (NOTE) Fact Sheet for Patients: bloggercourse.com  Fact Sheet for Healthcare Providers: seriousbroker.it  This test is not yet approved or cleared by the [place]  FDA and has been authorized for detection and/or diagnosis of SARS-CoV-2 by FDA under an Emergency Use Authorization (EUA). This EUA will remain in effect (meaning this test can be used) for the duration of the COVID-19 declaration under Section 564(b)(1) of the Act, 21 U.S.C. section 360bbb-3(b)(1), unless the authorization is terminated or revoked.  Performed at [facility], [address] [place], [place] [address]   Blood Culture (routine x 2)     Status: Abnormal   Collection Time: 10/17/24  8:16 PM   Specimen: BLOOD RIGHT FOREARM  Result Value Ref Range Status   Specimen Description BLOOD RIGHT FOREARM  Final   Special Requests   Final    BOTTLES DRAWN AEROBIC ONLY Blood Culture results may not be optimal due to an inadequate volume of blood received in culture bottles   Culture  Setup Time   Final    GRAM POSITIVE COCCI AEROBIC BOTTLE ONLY CRITICAL VALUE NOTED.  VALUE IS CONSISTENT WITH PREVIOUSLY REPORTED AND CALLED VALUE.    Culture (A)  Final    AEROCOCCUS URINAE Standardized susceptibility testing for this organism is not available. Performed at [facility], [address] [place], [place] [address]    Report Status 10/21/2024 FINAL  Final  Blood Culture (routine x 2)     Status: Abnormal (Preliminary result)   Collection Time:  10/17/24  8:21 PM   Specimen: BLOOD  Result Value Ref Range Status   Specimen Description BLOOD RIGHT ANTECUBITAL  Final   Special Requests   Final    BOTTLES DRAWN AEROBIC AND ANAEROBIC Blood Culture adequate volume   Culture  Setup Time   Final    GRAM POSITIVE COCCI IN BOTH AEROBIC AND ANAEROBIC BOTTLES CRITICAL RESULT CALLED TO, READ BACK BY AND VERIFIED WITH: [facility]  PHARMD 10/19/2024 @ 0335 BY DD    Culture (  A)  Final    AEROCOCCUS URINAE ENTEROCOCCUS FAECALIS SUBULTURING FOR SUSCEPTIBILITIES CRITICAL RESULT CALLED TO, READ BACK BY AND VERIFIED WITH: MAYA Karleen MATSU [phone] fcp Performed at [facility], [address] [place], [place] [address]    Report Status PENDING  Incomplete  Blood Culture ID Panel (Reflexed)     Status: None   Collection Time: 10/17/24  8:21 PM  Result Value Ref Range Status   Enterococcus faecalis NOT DETECTED NOT DETECTED Final   Enterococcus Faecium NOT DETECTED NOT DETECTED Final   Listeria monocytogenes NOT DETECTED NOT DETECTED Final   Staphylococcus species NOT DETECTED NOT DETECTED Final   Staphylococcus aureus (BCID) NOT DETECTED NOT DETECTED Final   Staphylococcus epidermidis NOT DETECTED NOT DETECTED Final   Staphylococcus lugdunensis NOT DETECTED NOT DETECTED Final   Streptococcus species NOT DETECTED NOT DETECTED Final   Streptococcus agalactiae NOT DETECTED NOT DETECTED Final   Streptococcus pneumoniae NOT DETECTED NOT DETECTED Final   Streptococcus pyogenes NOT DETECTED NOT DETECTED Final   A.calcoaceticus-baumannii NOT DETECTED NOT DETECTED Final   Bacteroides fragilis NOT DETECTED NOT DETECTED Final   Enterobacterales NOT DETECTED NOT DETECTED Final   Enterobacter cloacae complex NOT DETECTED NOT DETECTED Final   Escherichia coli NOT DETECTED NOT DETECTED Final   Klebsiella aerogenes NOT DETECTED NOT DETECTED Final   Klebsiella oxytoca NOT DETECTED NOT DETECTED Final   Klebsiella pneumoniae NOT DETECTED NOT DETECTED  Final   Proteus species NOT DETECTED NOT DETECTED Final   Salmonella species NOT DETECTED NOT DETECTED Final   Serratia marcescens NOT DETECTED NOT DETECTED Final   Haemophilus influenzae NOT DETECTED NOT DETECTED Final   Neisseria meningitidis NOT DETECTED NOT DETECTED Final   Pseudomonas aeruginosa NOT DETECTED NOT DETECTED Final   Stenotrophomonas maltophilia NOT DETECTED NOT DETECTED Final   Candida albicans NOT DETECTED NOT DETECTED Final   Candida auris NOT DETECTED NOT DETECTED Final   Candida glabrata NOT DETECTED NOT DETECTED Final   Candida krusei NOT DETECTED NOT DETECTED Final   Candida parapsilosis NOT DETECTED NOT DETECTED Final   Candida tropicalis NOT DETECTED NOT DETECTED Final   Cryptococcus neoformans/gattii NOT DETECTED NOT DETECTED Final    Comment: Performed at [facility], [address], [place], [place] [address]  MRSA Next Gen by PCR, Nasal     Status: None   Collection Time: 10/18/24 12:44 AM   Specimen: Nasal Mucosa; Nasal Swab  Result Value Ref Range Status   MRSA by PCR Next Gen NOT DETECTED NOT DETECTED Final    Comment: (NOTE) The GeneXpert MRSA Assay (FDA approved for NASAL specimens only), is one component of a comprehensive MRSA colonization surveillance program. It is not intended to diagnose MRSA infection nor to guide or monitor treatment for MRSA infections. Test performance is not FDA approved in patients less than 36 years old. Performed at [facility], [address], [place], [place] [address]   Urine Culture     Status: Abnormal   Collection Time: 10/18/24 12:55 AM   Specimen: Urine, Random  Result Value Ref Range Status   Specimen Description URINE, RANDOM  Final   Special Requests   Final    NONE Reflexed from [phone] Performed at [facility], [address] [place], [place] [address]    Culture MULTIPLE SPECIES PRESENT, SUGGEST RECOLLECTION (A)  Final   Report Status 10/19/2024 FINAL  Final  Respiratory (~20  pathogens) panel by PCR     Status:  None   Collection Time: 10/18/24  9:59 AM   Specimen: Nasopharyngeal Swab; Respiratory  Result Value Ref Range Status   Adenovirus NOT DETECTED NOT DETECTED Final   Coronavirus 229E NOT DETECTED NOT DETECTED Final    Comment: (NOTE) The Coronavirus on the Respiratory Panel, DOES NOT test for the novel  Coronavirus (2019 nCoV)    Coronavirus HKU1 NOT DETECTED NOT DETECTED Final   Coronavirus NL63 NOT DETECTED NOT DETECTED Final   Coronavirus OC43 NOT DETECTED NOT DETECTED Final   Metapneumovirus NOT DETECTED NOT DETECTED Final   Rhinovirus / Enterovirus NOT DETECTED NOT DETECTED Final   Influenza A NOT DETECTED NOT DETECTED Final   Influenza B NOT DETECTED NOT DETECTED Final   Parainfluenza Virus 1 NOT DETECTED NOT DETECTED Final   Parainfluenza Virus 2 NOT DETECTED NOT DETECTED Final   Parainfluenza Virus 3 NOT DETECTED NOT DETECTED Final   Parainfluenza Virus 4 NOT DETECTED NOT DETECTED Final   Respiratory Syncytial Virus NOT DETECTED NOT DETECTED Final   Bordetella pertussis NOT DETECTED NOT DETECTED Final   Bordetella Parapertussis NOT DETECTED NOT DETECTED Final   Chlamydophila pneumoniae NOT DETECTED NOT DETECTED Final   Mycoplasma pneumoniae NOT DETECTED NOT DETECTED Final    Comment: Performed at [facility], [address], [place], [place] [address]  Culture, blood (Routine X 2) w Reflex to ID Panel     Status: None (Preliminary result)   Collection Time: 10/19/24  8:44 AM   Specimen: BLOOD  Result Value Ref Range Status   Specimen Description   Final    BLOOD BLOOD LEFT HAND AEROBIC BOTTLE ONLY ANAEROBIC BOTTLE ONLY Performed at [facility], [address] [place], [place] [address]    Special Requests   Final    BOTTLES DRAWN AEROBIC AND ANAEROBIC Blood Culture results may not be optimal due to an inadequate volume of blood received in culture bottles Performed at [facility], [address] [place], [place] [address]    Culture   Final    NO GROWTH 3 DAYS Performed at [facility], [address] [place], [place] [address]    Report Status PENDING  Incomplete  Culture, blood (Routine X 2) w Reflex to ID Panel     Status: None (Preliminary result)   Collection Time: 10/19/24  8:46 AM   Specimen: BLOOD  Result Value Ref Range Status   Specimen Description   Final    BLOOD BLOOD LEFT HAND AEROBIC BOTTLE ONLY ANAEROBIC BOTTLE ONLY Performed at [facility], [address] [place], [place] [address]    Special Requests   Final    BOTTLES DRAWN AEROBIC AND ANAEROBIC Blood Culture results may not be optimal due to an inadequate volume of blood received in culture bottles Performed at [facility], [address], [place], [place] [address]    Culture   Final    NO GROWTH 3 DAYS Performed at [facility], [address] [place], [place] [address]    Report Status PENDING  Incomplete         Radiology Studies: No results found.         LOS: 4 days   Time spent= 35 mins    Burgess JAYSON Dare, MD [facility]  If 7PM-7AM, please contact night-coverage  10/22/2024, 12:05 PM  "

## 2024-10-22 NOTE — Progress Notes (Signed)
 Pt refusing all oral intake at this time, including medications and fluids. Pt requesting fluids that are not thickened. This RN explained to pt that we cannot do that at this time, due to his risk for aspiration. MD made aware of above.

## 2024-10-23 DIAGNOSIS — A419 Sepsis, unspecified organism: Secondary | ICD-10-CM | POA: Diagnosis not present

## 2024-10-23 DIAGNOSIS — R6520 Severe sepsis without septic shock: Secondary | ICD-10-CM | POA: Diagnosis not present

## 2024-10-23 LAB — GLUCOSE, CAPILLARY
Glucose-Capillary: 100 mg/dL — ABNORMAL HIGH (ref 70–99)
Glucose-Capillary: 95 mg/dL (ref 70–99)
Glucose-Capillary: 115 mg/dL — ABNORMAL HIGH (ref 70–99)
Glucose-Capillary: 101 mg/dL — ABNORMAL HIGH (ref 70–99)
Glucose-Capillary: 111 mg/dL — ABNORMAL HIGH (ref 70–99)
Glucose-Capillary: 100 mg/dL — ABNORMAL HIGH (ref 70–99)

## 2024-10-23 MED ORDER — METOPROLOL SUCCINATE ER 50 MG PO TB24
25.0000 mg | ORAL_TABLET | Freq: Every day | ORAL | Status: DC
  Administered 2024-10-23 – 2024-10-24 (×2): 25 mg via ORAL
  Filled 2024-10-23 (×3): qty 1

## 2024-10-23 MED ORDER — SODIUM CHLORIDE 0.9 % IV SOLN: INTRAVENOUS | Status: DC

## 2024-10-23 NOTE — Progress Notes (Signed)
 "        [facility]  Date of Admission:  10/17/2024     Principal Problem:   Severe sepsis [facility]) Active Problems:   Urinary retention   Pneumonia of left lung due to infectious organism   Pressure injury of skin          Assessment: 83 year old male who is DNR/DNI on comfort past medical history of HIV on [place] , CVA with right upper extremity weakness, COPD presenting difficulty swallowing.  Found to be hypotensive, family okay with vasopressors.  Found to have #E faecalis bacteremia #Urinary retention -Chest x-ray showed left mid and lower lung pneumonia on 12/21. Initially thought to have septic shock secondary to aspiration versus whole care associated pneumoniahe was started on vancomycin  and cefepime .  Blood cultures from admission grew 2 out of 2 sets E faecalis.  Urine cultures multiple species. - He has BPH with urinary retention, Foley catheter placed by urology with outpatient follow-up. -Primary noted today, family did not want tee/iv abx outpatient.  #HIV - On [place]  -  CD4 270 on 10/18/24 -will get VL Recommendations:  -Spoke with family, do not want tee or IV outpatient. Counseled that po abx without tee is not standard of care given e faecalis, they report understating. Ok to continue vanc+ unasyn  inpatient. Transition to amox 1gm tid x 6 weeks outpatient from negative blood Cx.  -Follow blood cx to ensure clearance. -Plan relayed to primary -ID will sign off  Microbiology:   Antibiotics: Vancomycin  12/21, 12/24-,  Cefepime  12/21- Linezolid  12/23-   Cultures: Blood 12/21 2/2 sets e faecalis 12/23 Urine 12/21 multiple species  SUBJECTIVE: Resting in bed. Family at bedside  Review of Systems: Review of Systems  Unable to perform ROS: Medical condition     Scheduled Meds:  abacavir -dolutegravir -lamiVUDine   1 tablet Oral Daily   arformoterol   15 mcg Nebulization BID   budesonide  (PULMICORT ) nebulizer solution  0.25  mg Nebulization BID   Chlorhexidine  Gluconate Cloth  6 each Topical QHS   clopidogrel   75 mg Oral Daily   dutasteride   0.5 mg Oral Daily   ezetimibe   10 mg Oral Daily   heparin   5,000 Units Subcutaneous Q8H   insulin  aspart  0-9 Units Subcutaneous Q4H   loratadine   10 mg Oral Daily   mouth rinse  15 mL Mouth Rinse 4 times per day   revefenacin   175 mcg Nebulization Daily   tamsulosin   0.4 mg Oral Daily   Continuous Infusions:  sodium chloride  50 mL/hr at 10/22/24 1132   ampicillin -sulbactam (UNASYN ) IV 3 g (10/22/24 1848)   vancomycin  750 mg (10/22/24 1450)   PRN Meds:.acetaminophen , dextrose , glucagon  (human recombinant), guaiFENesin , hydrALAZINE , ipratropium-albuterol , metoprolol  tartrate, ondansetron  (ZOFRAN ) IV, mouth rinse, senna-docusate Allergies[1]  OBJECTIVE: Vitals:   10/22/24 0748 10/22/24 1431 10/22/24 2047 10/22/24 2343  BP:  (!) 146/76 134/82   Pulse:  88 76   Resp:  16 20   Temp:  98 F (36.7 C) 98.4 F (36.9 C)   TempSrc:  Oral Oral   SpO2: 94% 97% 98% 97%  Weight:      Height:       Body mass index is 20.54 kg/m.  Physical Exam Constitutional:      Appearance: He is not toxic-appearing.  HENT:     Head: Normocephalic and atraumatic.     Right Ear: External ear normal.     Left Ear: External ear normal.     Nose: No congestion or rhinorrhea.  Mouth/Throat:     Mouth: Mucous membranes are moist.     Pharynx: Oropharynx is clear.  Eyes:     Extraocular Movements: Extraocular movements intact.     Conjunctiva/sclera: Conjunctivae normal.     Pupils: Pupils are equal, round, and reactive to light.  Cardiovascular:     Rate and Rhythm: Normal rate and regular rhythm.     Heart sounds: No murmur heard.    No friction rub. No gallop.  Pulmonary:     Effort: Pulmonary effort is normal.     Breath sounds: Normal breath sounds.  Abdominal:     General: Abdomen is flat. Bowel sounds are normal.     Palpations: Abdomen is soft.  Musculoskeletal:         General: No swelling.     Cervical back: Normal range of motion and neck supple.  Skin:    General: Skin is warm and dry.  Psychiatric:        Mood and Affect: Mood normal.       Lab Results Lab Results  Component Value Date   WBC 7.4 10/21/2024   HGB 12.6 (L) 10/21/2024   HCT 35.4 (L) 10/21/2024   MCV 91.9 10/21/2024   PLT 101 (L) 10/21/2024    Lab Results  Component Value Date   CREATININE 0.76 10/20/2024   BUN 12 10/20/2024   NA 139 10/20/2024   K 3.6 10/20/2024   CL 108 10/20/2024   CO2 23 10/20/2024    Lab Results  Component Value Date   ALT 21 10/17/2024   AST 86 (H) 10/17/2024   ALKPHOS 54 10/17/2024   BILITOT 1.2 10/17/2024        [name], MD [facility] 10/23/2024, 1:43 AM Evaluation of this patient requires complex antimicrobial therapy evaluation and counseling + isolation needs for disease transmission risk assessment and mitigation      [1] No Known Allergies  "

## 2024-10-23 NOTE — Evaluation (Signed)
 Physical Therapy Evaluation Patient Details Name: Brian [name] MRN: [id number] DOB: 17-Nov-1940 Today's Date: 10/23/2024  History of Present Illness  Brian [name] is a 83 y.o. male with medical history significant for HTN, HIV on HAART, prior CVA with right upper extremity weakness, COPD admitted with severe sepsis 2* acquired pna vs aspiration pna.  Clinical Impression  On eval, pt required Mod A +2 for mobility. He was able to stand and attempt a couple lateral steps with RW along the bedside. Pt presents with general weakness, decreased activity tolerance, and impaired gait/balance. He is at risk for falls when mobilizing. He also seemed to have some confusion-could not state his location when interpreter posed question to him. No family was present during the session. Recommend return to SNF once medically ready. Patient will benefit from continued inpatient follow up therapy, <3 hours/day         If plan is discharge home, recommend the following: Two people to help with walking and/or transfers;A lot of help with bathing/dressing/bathroom   Can travel by private vehicle   No    Equipment Recommendations    Recommendations for Other Services       Functional Status Assessment Patient has had a recent decline in their functional status and demonstrates the ability to make significant improvements in function in a reasonable and predictable amount of time.     Precautions / Restrictions Precautions Precautions: Fall Restrictions Weight Bearing Restrictions Per Provider Order: No      Mobility  Bed Mobility Overal bed mobility: Needs Assistance Bed Mobility: Supine to Sit     Supine to sit: Mod assist, +2 for physical assistance, +2 for safety/equipment, HOB elevated, Used rails Sit to supine: Mod assist, +2 for physical assistance, +2 for safety/equipment, HOB elevated, Used rails   General bed mobility comments: cues for sequencing, technique. assist for truncal elevation  and BLE assist d/t L hip pain    Transfers Overall transfer level: Needs assistance Equipment used: Rolling walker (2 wheels) Transfers: Sit to/from Stand Sit to Stand: Mod assist, +2 physical assistance, +2 safety/equipment, From elevated surface           General transfer comment: cues for safety, hand placement and sequencing to stand from bed. Assist to power up, staabilize, control descent. Pt with flexed trunk/hip posture.    Ambulation/Gait Ambulation/Gait assistance: Mod assist, +2 physical assistance, +2 safety/equipment   Assistive device: Rolling walker (2 wheels)       Pre-gait activities: Attempted lateral steps along bedside with RW. Pt had great difficulty-required assist for standing balance (mod L side leaning), RW management, weight shifting.    Stairs            Wheelchair Mobility     Tilt Bed    Modified Rankin (Stroke Patients Only)       Balance Overall balance assessment: Needs assistance Sitting-balance support: Feet supported, Bilateral upper extremity supported Sitting balance-Leahy Scale: Fair     Standing balance support: Bilateral upper extremity supported, During functional activity, Reliant on assistive device for balance Standing balance-Leahy Scale: Poor                               Pertinent Vitals/Pain Pain Assessment Pain Assessment: Faces Faces Pain Scale: Hurts little more Pain Location: L LE with movement Pain Descriptors / Indicators: Discomfort, Grimacing, Sore Pain Intervention(s): Monitored during session    Home Living Family/patient expects to be discharged to:: Skilled  nursing facility                        Prior Function               Mobility Comments: ST rehab at SNF prior to admission. hx of falls. uses RW for mobility although pt denies ability to ambulate       Extremity/Trunk Assessment   Upper Extremity Assessment Upper Extremity Assessment: Defer to OT  evaluation    Lower Extremity Assessment Lower Extremity Assessment: Generalized weakness (unable to accurately/completely assess L LE due to pain)       Communication   Communication Communication: Impaired Factors Affecting Communication: Non - English speaking, interpreter not available (utilized ipad interpreter Eric)    Cognition Arousal: Alert Behavior During Therapy: Flat affect   PT - Cognitive impairments: Memory, Orientation Difficult to assess due to: Non-English speaking Orientation impairments: Place                   PT - Cognition Comments: pt stated he did not know where he was when interpreter asked Following commands: Impaired Following commands impaired: Follows one step commands with increased time     Cueing Cueing Techniques: Verbal cues, Tactile cues     General Comments      Exercises     Assessment/Plan    PT Assessment Patient needs continued PT services  PT Problem List Decreased strength;Decreased range of motion;Decreased activity tolerance;Decreased balance;Decreased mobility;Pain       PT Treatment Interventions DME instruction;Gait training;Functional mobility training;Therapeutic activities;Therapeutic exercise;Balance training;Neuromuscular re-education;Patient/family education    PT Goals (Current goals can be found in the Care Plan section)  Acute Rehab PT Goals Patient Stated Goal: none stated PT Goal Formulation: Patient unable to participate in goal setting Time For Goal Achievement: 11/06/24 Potential to Achieve Goals: Fair    Frequency Min 2X/week     Co-evaluation               AM-PAC PT 6 Clicks Mobility  Outcome Measure Help needed turning from your back to your side while in a flat bed without using bedrails?: A Lot Help needed moving from lying on your back to sitting on the side of a flat bed without using bedrails?: A Lot Help needed moving to and from a bed to a chair (including a  wheelchair)?: A Lot Help needed standing up from a chair using your arms (e.g., wheelchair or bedside chair)?: A Lot Help needed to walk in hospital room?: Total Help needed climbing 3-5 steps with a railing? : Total 6 Click Score: 10    End of Session Equipment Utilized During Treatment: Gait belt Activity Tolerance: Patient limited by fatigue;Patient limited by pain Patient left: in bed;with call bell/phone within reach;with bed alarm set   PT Visit Diagnosis: Unsteadiness on feet (R26.81);Other abnormalities of gait and mobility (R26.89);Muscle weakness (generalized) (M62.81);Difficulty in walking, not elsewhere classified (R26.2);History of falling (Z91.81);Pain Pain - Right/Left: Left Pain - part of body: Hip    Time: 1207-1222 PT Time Calculation (min) (ACUTE ONLY): 15 min   Charges:   PT Evaluation $PT Eval Low Complexity: 1 Low   PT General Charges $$ ACUTE PT VISIT: 1 Visit           Dannial SQUIBB, PT Acute Rehabilitation  Office: [phone]

## 2024-10-23 NOTE — Progress Notes (Signed)
 " PROGRESS NOTE    Brian [name]  FMW:[id number] DOB: May 11, 1941 DOA: 10/17/2024 PCP: [name], MD    Brief Narrative:  83 year old Spanish-speaking male from SNF [facility] DNR/DNI with past medical history of HTN, HIV on [place], CVA with right upper extremity weakness, COPD, chronic dysphagia presented with shortness of breath and cough.  Patient found to have sepsis secondary to pneumonia started on broad-spectrum antibiotics.  Patient was admitted to the ICU initially as he required pressors. Initially required Levophed  now this has been off. Respiratory panel 20 pathogen, COVID/flu/RSV negative. Echocardiogram shows EF of 55%, dilated aortic root but no obvious vegetation.  Initial cultures grew E faecalis bacteremia, repeat currently remains unremarkable. After having prolonged discussion with the family members it was decided that we will treat empirically with p.o. antibiotics amoxicillin  1 g 3 times daily for 6 weeks.  Family does understand this is not standard of care as they do not want TEE or IV antibiotics outpatient.   TOC consulted, as long as we have safe disposition we can discharge him.   Assessment & Plan:  Septic shock secondary to aspiration versus healthcare acquired pneumonia E faecalis bacteremia - Chest x-ray showing left-sided infiltrate.  Initially required Levophed  now this has been off. Respiratory panel 20 pathogen, COVID/flu/RSV negative. Echocardiogram shows EF of 55%, dilated aortic root but no obvious vegetation.  Initial cultures grew E faecalis bacteremia, repeat currently remains unremarkable - After having prolonged discussion with the family members it was decided that we will treat empirically with p.o. antibiotics amoxicillin  1 g 3 times daily for 6 weeks.  Family does understand this is not standard of care as they do not want TEE or IV antibiotics outpatient  COPD -Not an active exacerbation.  Bronchodilators  Acute kidney injury: Resolved.   -Baseline creatinine 0.9, admission creatinine 3.91.  Continue IV fluids.  HIV -Home p.o. medications  Essential hypertension - Home p.o. 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  Patient remains DNR/DNI with comfort measures.  Only excepts antibiotics and IV fluids if indicated otherwise no feeding tube.  Palliative care team consulted by PCCM team  Failed speech and swallow eval. severe aspirations noted on MBS.  Recommended against any advance feeding tube, family agrees.  DVT prophylaxis: SQ Hep Code Status= DNR/DNI Family Communication: Met with family members at bedside on 12/26 along with palliative care team Status is: Inpatient Remains inpatient appropriate because: Notified TOC the patient is ready for discharge as long as we have safe disposition   PT Follow up Recs:   Subjective: Overall still weak.  Poor oral intake  Examination:  General exam: Appears calm and comfortable, chronically ill-appearing Respiratory system: Clear to auscultation. Respiratory effort normal. Cardiovascular system: S1 & S2 heard, RRR. No JVD, murmurs, rubs, gallops or clicks. No pedal edema. Gastrointestinal system: Abdomen is nondistended, soft and nontender. No organomegaly or masses felt. Normal bowel sounds heard. Central nervous system: Alert and oriented. No focal neurological deficits. Extremities: Symmetric 5 x 5 power. Skin: No rashes, lesions or ulcers Psychiatry: Judgement and insight appear poor Foley in place            Wound 10/18/24 0400 Pressure Injury Sacrum Bilateral Stage 1 -  Intact skin with non-blanchable redness of  a localized area usually over a bony prominence. (Active)     Diet Orders (From admission, onward)      Start     Ordered   10/22/24 1302  DIET - DYS 1 Room service appropriate? Yes; Fluid consistency: Pudding Thick  Diet effective now       Comments: Okay for comfort feed with family  Question Answer Comment  Room service appropriate? Yes   Fluid consistency: Pudding Thick      10/22/24 1301            Objective: Vitals:   10/22/24 1431 10/22/24 2047 10/22/24 2343 10/23/24 0438  BP: (!) 146/76 134/82  (!) 157/71  Pulse: 88 76  65  Resp: 16 20  15   Temp: 98 F (36.7 C) 98.4 F (36.9 C)  98.1 F (36.7 C)  TempSrc: Oral Oral  Oral  SpO2: 97% 98% 97% 97%  Weight:    59.8 kg  Height:        Intake/Output Summary (Last 24 hours) at 10/23/2024 1056 Last data filed at 10/23/2024 1053 Gross per 24 hour  Intake 1833.33 ml  Output 2150 ml  Net -316.67 ml   Filed Weights   10/20/24 0500 10/22/24 0500 10/23/24 0438  Weight: 49.7 kg 59.5 kg 59.8 kg    Scheduled Meds:  abacavir -dolutegravir -lamiVUDine   1 tablet Oral Daily   arformoterol   15 mcg Nebulization BID   budesonide  (PULMICORT ) nebulizer solution  0.25 mg Nebulization BID   Chlorhexidine  Gluconate Cloth  6 each Topical QHS   clopidogrel   75 mg Oral Daily   dutasteride   0.5 mg Oral Daily   ezetimibe   10 mg Oral Daily   heparin   5,000 Units Subcutaneous Q8H   insulin  aspart  0-9 Units Subcutaneous Q4H   loratadine   10 mg Oral Daily   metoprolol  succinate  25 mg Oral Daily   mouth rinse  15 mL Mouth Rinse 4 times per day   revefenacin   175 mcg Nebulization Daily   tamsulosin   0.4 mg Oral Daily   Continuous Infusions:  sodium chloride  50 mL/hr at 10/23/24 0742   ampicillin -sulbactam (UNASYN ) IV 3 g (10/23/24 0653)   vancomycin  750 mg (10/22/24 1450)    Nutritional status     Body mass index is 20.65 kg/m.  Data Reviewed:   CBC: Recent Labs  Lab 10/17/24 2009 10/18/24 0148 10/21/24 0524  WBC 18.7* 15.2* 7.4  NEUTROABS 17.4*  --   --   HGB 13.9 12.0* 12.6*  HCT 41.5 34.7* 35.4*  MCV 96.7 95.1 91.9   PLT 100* 78* 101*   Basic Metabolic Panel: Recent Labs  Lab 10/17/24 2009 10/18/24 0148 10/19/24 0844 10/20/24 0258  NA 141 138 141 139  K 4.5 4.3 4.1 3.6  CL 103 105 111 108  CO2 17* 17* 22 23  GLUCOSE 131* 99 78 87  BUN 48* 47* 21 12  CREATININE 3.91* 3.29* 0.84 0.76  CALCIUM  9.0 8.3* 8.3* 7.7*  MG  --  1.4* 2.4 2.3  PHOS  --  4.6 2.2*  --    GFR: Estimated Creatinine Clearance: 59.2 mL/min (by C-G formula based on SCr of 0.76 mg/dL). Liver Function Tests: Recent Labs  Lab 10/17/24 2009  AST 86*  ALT 21  ALKPHOS 54  BILITOT 1.2  PROT 6.6  ALBUMIN 3.4*   No results for input(s): LIPASE, AMYLASE in the last 168 hours. No results for input(s): AMMONIA in the last 168 hours. Coagulation Profile: Recent Labs  Lab 10/17/24  2009  INR 1.4*   Cardiac Enzymes: No results for input(s): CKTOTAL, CKMB, CKMBINDEX, TROPONINI in the last 168 hours. BNP (last 3 results) No results for input(s): PROBNP in the last 8760 hours. HbA1C: No results for input(s): HGBA1C in the last 72 hours. CBG: Recent Labs  Lab 10/22/24 1621 10/22/24 2046 10/22/24 2341 10/23/24 0406 10/23/24 0808  GLUCAP 128* 125* 105* 100* 95   Lipid Profile: No results for input(s): CHOL, HDL, LDLCALC, TRIG, CHOLHDL, LDLDIRECT in the last 72 hours. Thyroid Function Tests: No results for input(s): TSH, T4TOTAL, FREET4, T3FREE, THYROIDAB in the last 72 hours. Anemia Panel: No results for input(s): VITAMINB12, FOLATE, FERRITIN, TIBC, IRON, RETICCTPCT in the last 72 hours. Sepsis Labs: Recent Labs  Lab 10/17/24 2222 10/18/24 0140 10/18/24 0449 10/18/24 0826  LATICACIDVEN 8.0* 4.7* 2.7* 1.6    Recent Results (from the past 240 hours)  Resp panel by RT-PCR (RSV, Flu A&B, Covid) Anterior Nasal Swab     Status: None   Collection Time: 10/17/24  8:16 PM   Specimen: Anterior Nasal Swab  Result Value Ref Range Status   SARS Coronavirus 2 by RT PCR  NEGATIVE NEGATIVE Final   Influenza A by PCR NEGATIVE NEGATIVE Final   Influenza B by PCR NEGATIVE NEGATIVE Final    Comment: (NOTE) The Xpert Xpress SARS-CoV-2/FLU/RSV plus assay is intended as an aid in the diagnosis of influenza from Nasopharyngeal swab specimens and should not be used as a sole basis for treatment. Nasal washings and aspirates are unacceptable for Xpert Xpress SARS-CoV-2/FLU/RSV testing.  Fact Sheet for Patients: bloggercourse.com  Fact Sheet for Healthcare Providers: seriousbroker.it  This test is not yet approved or cleared by the [place]  FDA and has been authorized for detection and/or diagnosis of SARS-CoV-2 by FDA under an Emergency Use Authorization (EUA). This EUA will remain in effect (meaning this test can be used) for the duration of the COVID-19 declaration under Section 564(b)(1) of the Act, 21 U.S.C. section 360bbb-3(b)(1), unless the authorization is terminated or revoked.     Resp Syncytial Virus by PCR NEGATIVE NEGATIVE Final    Comment: (NOTE) Fact Sheet for Patients: bloggercourse.com  Fact Sheet for Healthcare Providers: seriousbroker.it  This test is not yet approved or cleared by the [place]  FDA and has been authorized for detection and/or diagnosis of SARS-CoV-2 by FDA under an Emergency Use Authorization (EUA). This EUA will remain in effect (meaning this test can be used) for the duration of the COVID-19 declaration under Section 564(b)(1) of the Act, 21 U.S.C. section 360bbb-3(b)(1), unless the authorization is terminated or revoked.  Performed at [facility], [address], [place], [place] [address]   Blood Culture (routine x 2)     Status: Abnormal   Collection Time: 10/17/24  8:16 PM   Specimen: BLOOD RIGHT FOREARM  Result Value Ref Range Status   Specimen Description BLOOD RIGHT FOREARM  Final    Special Requests   Final    BOTTLES DRAWN AEROBIC ONLY Blood Culture results may not be optimal due to an inadequate volume of blood received in culture bottles   Culture  Setup Time   Final    GRAM POSITIVE COCCI AEROBIC BOTTLE ONLY CRITICAL VALUE NOTED.  VALUE IS CONSISTENT WITH PREVIOUSLY REPORTED AND CALLED VALUE.    Culture (A)  Final    AEROCOCCUS URINAE Standardized susceptibility testing for this organism is not available. Performed at [facility], [address] [place], [place] [address]  Report Status 10/21/2024 FINAL  Final  Blood Culture (routine x 2)     Status: Abnormal (Preliminary result)   Collection Time: 10/17/24  8:21 PM   Specimen: BLOOD  Result Value Ref Range Status   Specimen Description BLOOD RIGHT ANTECUBITAL  Final   Special Requests   Final    BOTTLES DRAWN AEROBIC AND ANAEROBIC Blood Culture adequate volume   Culture  Setup Time   Final    GRAM POSITIVE COCCI IN BOTH AEROBIC AND ANAEROBIC BOTTLES CRITICAL RESULT CALLED TO, READ BACK BY AND VERIFIED WITH: [facility]  PHARMD 10/19/2024 @ 0335 BY DD    Culture (A)  Final    AEROCOCCUS URINAE ENTEROCOCCUS FAECALIS SUSCEPTIBILITIES TO FOLLOW CRITICAL RESULT CALLED TO, READ BACK BY AND VERIFIED WITH: MAYA Karleen MATSU [phone] fcp Performed at [facility], [address] [place], [place] [address]    Report Status PENDING  Incomplete  Blood Culture ID Panel (Reflexed)     Status: None   Collection Time: 10/17/24  8:21 PM  Result Value Ref Range Status   Enterococcus faecalis NOT DETECTED NOT DETECTED Final   Enterococcus Faecium NOT DETECTED NOT DETECTED Final   Listeria monocytogenes NOT DETECTED NOT DETECTED Final   Staphylococcus species NOT DETECTED NOT DETECTED Final   Staphylococcus aureus (BCID) NOT DETECTED NOT DETECTED Final   Staphylococcus epidermidis NOT DETECTED NOT DETECTED Final   Staphylococcus lugdunensis NOT DETECTED NOT DETECTED Final   Streptococcus species NOT  DETECTED NOT DETECTED Final   Streptococcus agalactiae NOT DETECTED NOT DETECTED Final   Streptococcus pneumoniae NOT DETECTED NOT DETECTED Final   Streptococcus pyogenes NOT DETECTED NOT DETECTED Final   A.calcoaceticus-baumannii NOT DETECTED NOT DETECTED Final   Bacteroides fragilis NOT DETECTED NOT DETECTED Final   Enterobacterales NOT DETECTED NOT DETECTED Final   Enterobacter cloacae complex NOT DETECTED NOT DETECTED Final   Escherichia coli NOT DETECTED NOT DETECTED Final   Klebsiella aerogenes NOT DETECTED NOT DETECTED Final   Klebsiella oxytoca NOT DETECTED NOT DETECTED Final   Klebsiella pneumoniae NOT DETECTED NOT DETECTED Final   Proteus species NOT DETECTED NOT DETECTED Final   Salmonella species NOT DETECTED NOT DETECTED Final   Serratia marcescens NOT DETECTED NOT DETECTED Final   Haemophilus influenzae NOT DETECTED NOT DETECTED Final   Neisseria meningitidis NOT DETECTED NOT DETECTED Final   Pseudomonas aeruginosa NOT DETECTED NOT DETECTED Final   Stenotrophomonas maltophilia NOT DETECTED NOT DETECTED Final   Candida albicans NOT DETECTED NOT DETECTED Final   Candida auris NOT DETECTED NOT DETECTED Final   Candida glabrata NOT DETECTED NOT DETECTED Final   Candida krusei NOT DETECTED NOT DETECTED Final   Candida parapsilosis NOT DETECTED NOT DETECTED Final   Candida tropicalis NOT DETECTED NOT DETECTED Final   Cryptococcus neoformans/gattii NOT DETECTED NOT DETECTED Final    Comment: Performed at [facility], [address], [place], [place] [address]  MRSA Next Gen by PCR, Nasal     Status: None   Collection Time: 10/18/24 12:44 AM   Specimen: Nasal Mucosa; Nasal Swab  Result Value Ref Range Status   MRSA by PCR Next Gen NOT DETECTED NOT DETECTED Final    Comment: (NOTE) The GeneXpert MRSA Assay (FDA approved for NASAL specimens only), is one component of a comprehensive MRSA colonization surveillance program. It is not intended to diagnose MRSA infection nor  to guide or monitor treatment for MRSA infections. Test performance is not FDA approved in patients less than 2 years  old. Performed at [facility], [address], [place], [place] [address]   Urine Culture     Status: Abnormal   Collection Time: 10/18/24 12:55 AM   Specimen: Urine, Random  Result Value Ref Range Status   Specimen Description URINE, RANDOM  Final   Special Requests   Final    NONE Reflexed from [id number] Performed at [facility], [address], [place], [place] [address]    Culture MULTIPLE SPECIES PRESENT, SUGGEST RECOLLECTION (A)  Final   Report Status 10/19/2024 FINAL  Final  Respiratory (~20 pathogens) panel by PCR     Status: None   Collection Time: 10/18/24  9:59 AM   Specimen: Nasopharyngeal Swab; Respiratory  Result Value Ref Range Status   Adenovirus NOT DETECTED NOT DETECTED Final   Coronavirus 229E NOT DETECTED NOT DETECTED Final    Comment: (NOTE) The Coronavirus on the Respiratory Panel, DOES NOT test for the novel  Coronavirus (2019 nCoV)    Coronavirus HKU1 NOT DETECTED NOT DETECTED Final   Coronavirus NL63 NOT DETECTED NOT DETECTED Final   Coronavirus OC43 NOT DETECTED NOT DETECTED Final   Metapneumovirus NOT DETECTED NOT DETECTED Final   Rhinovirus / Enterovirus NOT DETECTED NOT DETECTED Final   Influenza A NOT DETECTED NOT DETECTED Final   Influenza B NOT DETECTED NOT DETECTED Final   Parainfluenza Virus 1 NOT DETECTED NOT DETECTED Final   Parainfluenza Virus 2 NOT DETECTED NOT DETECTED Final   Parainfluenza Virus 3 NOT DETECTED NOT DETECTED Final   Parainfluenza Virus 4 NOT DETECTED NOT DETECTED Final   Respiratory Syncytial Virus NOT DETECTED NOT DETECTED Final   Bordetella pertussis NOT DETECTED NOT DETECTED Final   Bordetella Parapertussis NOT DETECTED NOT DETECTED Final   Chlamydophila pneumoniae NOT DETECTED NOT DETECTED Final   Mycoplasma pneumoniae NOT DETECTED NOT DETECTED Final    Comment: Performed at [facility], [address], [place], [place] [address]  Culture, blood (Routine X 2) w Reflex to ID Panel     Status: None (Preliminary result)   Collection Time: 10/19/24  8:44 AM   Specimen: BLOOD LEFT HAND  Result Value Ref Range Status   Specimen Description   Final    BLOOD LEFT HAND Performed at [facility], [address], [place], [place] [address]    Special Requests   Final    BOTTLES DRAWN AEROBIC AND ANAEROBIC Blood Culture results may not be optimal due to an inadequate volume of blood received in culture bottles Performed at [facility], [address], [place], [place] [address]    Culture   Final    NO GROWTH 4 DAYS Performed at [facility], [address] [place], [place] [address]    Report Status PENDING  Incomplete  Culture, blood (Routine X 2) w Reflex to ID Panel     Status: None (Preliminary result)   Collection Time: 10/19/24  8:46 AM   Specimen: BLOOD LEFT HAND  Result Value Ref Range Status   Specimen Description   Final    BLOOD LEFT HAND Performed at [facility], [address] [place], [place] [address]    Special Requests   Final    BOTTLES DRAWN AEROBIC AND ANAEROBIC Blood Culture results may not be optimal due to an inadequate volume of blood received in culture bottles Performed at [facility], [address], [place], [place] [address]    Culture  Final    NO GROWTH 4 DAYS Performed at [facility], [address], [place], [place] [address]    Report Status PENDING  Incomplete         Radiology Studies: No results found.         LOS: 5 days   Time spent= 35 mins    Burgess JAYSON Dare, MD [facility]  If 7PM-7AM, please contact night-coverage  10/23/2024, 10:56 AM  "

## 2024-10-23 NOTE — Progress Notes (Signed)
 "                                                                                                                                                                                                          Daily Progress Note   Patient Name: Brian [name]       Date: 10/23/2024 DOB: 05-May-1941  Age: 82 y.o. MRN#: [id number] Attending Physician: Caleen Burgess BROCKS, MD Primary Care Physician: [name], MD Admit Date: 10/17/2024  Reason for Consultation/Follow-up: Establishing goals of care  Subjective: Resting comfortably in bed, no distress, tolerating PO, discussed with bedside RN and appreciate her assistance, ID has given PO Abx recommendations - 1 gm Amox TID for 6 weeks. TOC working on placement, no family on bedside.   Length of Stay: 5  Current Medications: Scheduled Meds:   abacavir -dolutegravir -lamiVUDine   1 tablet Oral Daily   arformoterol   15 mcg Nebulization BID   budesonide  (PULMICORT ) nebulizer solution  0.25 mg Nebulization BID   Chlorhexidine  Gluconate Cloth  6 each Topical QHS   clopidogrel   75 mg Oral Daily   dutasteride   0.5 mg Oral Daily   ezetimibe   10 mg Oral Daily   heparin   5,000 Units Subcutaneous Q8H   insulin  aspart  0-9 Units Subcutaneous Q4H   loratadine   10 mg Oral Daily   metoprolol  succinate  25 mg Oral Daily   mouth rinse  15 mL Mouth Rinse 4 times per day   revefenacin   175 mcg Nebulization Daily   tamsulosin   0.4 mg Oral Daily    Continuous Infusions:  ampicillin -sulbactam (UNASYN ) IV 3 g (10/23/24 0653)   vancomycin  750 mg (10/22/24 1450)    PRN Meds: acetaminophen , dextrose , glucagon  (human recombinant), guaiFENesin , hydrALAZINE , ipratropium-albuterol , metoprolol  tartrate, ondansetron  (ZOFRAN ) IV, mouth rinse, senna-docusate  Physical Exam         Awake alert Resting in bed Appears chronically ill No overt signs of aspiration at present.   Vital Signs: BP (!) 157/71 (BP Location: Right Arm)   Pulse 65   Temp 98.1 F (36.7 C)  (Oral)   Resp 15   Ht 5' 7 (1.702 m)   Wt 59.8 kg   SpO2 97%   BMI 20.65 kg/m  SpO2: SpO2: 97 % O2 Device: O2 Device: Room Air O2 Flow Rate: O2 Flow Rate (L/min): 1 L/min  Intake/output summary:  Intake/Output Summary (Last 24 hours) at 10/23/2024 1155 Last data filed at 10/23/2024 1137 Gross per 24 hour  Intake 2313.33 ml  Output 2150 ml  Net 163.33 ml   LBM:  Last BM Date : 10/17/24 Baseline Weight: Weight: 59.4 kg (Wt from 09/21/2024) Most recent weight: Weight: 59.8 kg       Palliative Assessment/Data:      Patient Active Problem List   Diagnosis Date Noted   Severe sepsis (HCC) 10/18/2024   Urinary retention 10/18/2024   Pneumonia of left lung due to infectious organism 10/18/2024   Pressure injury of skin 10/18/2024   Fall 09/21/2024   Rhabdomyolysis 09/21/2024   Closed fracture of one rib of left side 11/29/2023   Generalized weakness 11/28/2023   UTI (urinary tract infection) 11/28/2023   Acute metabolic encephalopathy 11/28/2023   Sepsis (HCC) 07/17/2023   History of stroke 04/25/2022   Hyperlipidemia    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    Depression with anxiety    Left hip pain    Essential hypertension 08/31/2020   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   Peripheral neuropathy 05/03/2020   COPD (chronic obstructive pulmonary disease) (HCC) 11/27/2018   AKI (acute kidney injury) 07/30/2015   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   Sleep apnea 07/30/2015   HIV (human immunodeficiency virus infection) (HCC) 07/30/2015   Depression 07/30/2015   BPH (benign prostatic hyperplasia) 07/30/2015    Palliative Care Assessment & Plan   Patient Profile:    Assessment: The patient is an 83 year old Spanish-speaking male from [facility] with DNR/DNI status and significant medical history including hypertension, HIV on HAART, 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 He presented with shortness of breath  and cough and was found to be in septic shock secondary to pneumonia (aspiration versus healthcare-associated). He initially required ICU admission and vasopressor support, which has since been discontinued. Blood cultures grew Enterococcus faecalis bacteremia. Chest X-ray showed a left-sided infiltrate, respiratory viral testing was negative, and echocardiogram demonstrated preserved EF (55%) without evidence of vegetation. Antibiotics were adjusted per Infectious Disease to vancomycin  and Unasyn .  The hospital course has been complicated by severe dysphagia with failed speech and swallow evaluation and significant aspiration noted on modified barium swallow. Acute kidney injury present on admission has resolved. COPD is not in acute exacerbation. The patient remains DNR/DNI. A goals-of-care discussion was previously conducted by the [facility] team, and palliative care was consulted for ongoing support and guidance.   Recommendations/Plan:  return to [facility] with oral antibiotics, outpatient palliative care follow-up, and a continued focus on comfort-based goals aligned with the patients DNR/DNI status and with the goals to shift to hospice care in the upcoming weeks.     Goals of Care and Additional Recommendations: Limitations on Scope of Treatment: No Artificial Feeding  Code Status:    Code Status Orders  (From admission, onward)           Start     Ordered   10/18/24 0901  Do not attempt resuscitation (DNR)- Limited -Do Not Intubate (DNI)  (Code Status)  Continuous       Question Answer Comment  If pulseless and not breathing No CPR or chest compressions.   In Pre-Arrest Conditions (Patient Is Breathing and Has A Pulse) Do not intubate. Provide all appropriate non-invasive medical interventions. Avoid ICU transfer unless indicated or required.   Consent: Discussion documented in EHR or advanced directives reviewed      10/18/24 0900           Code Status History     Date  Active Date Inactive Code Status Order ID Comments User Context   10/18/2024 0045 10/18/2024 0900  Do not attempt resuscitation (DNR) - Comfort care [id number]  Maree Harder, MD ED   09/21/2024 1828 09/27/2024 1958 Limited: Do not attempt resuscitation (DNR) -DNR-LIMITED -Do Not Intubate/DNI  [id number]  Lanetta Lingo, MD ED   09/21/2024 1826 09/21/2024 1828 Full Code [id number]  Lanetta Lingo, MD ED   11/28/2023 0217 12/02/2023 1621 Limited: Do not attempt resuscitation (DNR) -DNR-LIMITED -Do Not Intubate/DNI  [id number]  Cleatus Delayne GAILS, MD ED   07/17/2023 0056 07/21/2023 1651 Limited: Do not attempt resuscitation (DNR) -DNR-LIMITED -Do Not Intubate/DNI  [id number]  Hilma Rankins, MD ED   04/25/2022 1017 05/04/2022 2130 DNR [id number]  Niu, Xilin, MD ED   04/25/2022 0959 04/25/2022 1017 Full Code [id number]  Hilma Rankins, MD ED   07/13/2021 2026 07/18/2021 1710 Full Code [id number]  Shona Terry SAILOR, DO ED   07/31/2015 0034 08/03/2015 1819 Full Code [id number]  Jess Jonne SAILOR, MD Inpatient      Advance Directive Documentation    Flowsheet Row Most Recent Value  Type of Advance Directive Out of facility DNR (pink MOST or yellow form)  Pre-existing out of facility DNR order (yellow form or pink MOST form) Physician notified to receive inpatient order, Pink MOST form placed in chart (order not valid for inpatient use)  MOST Form in Place? --    Prognosis:  < 12 months  Discharge Planning: Emmalene Hertz with palliative  Care plan was discussed with  IDT RN Thank you for allowing the Palliative Medicine Team to assist in the care of this patient.  Mod MDM.      Greater than 50%  of this time was spent counseling and coordinating care related to the above assessment and plan.  Lonia Serve, MD  Please contact Palliative Medicine Team phone at [phone] for questions and concerns.       "

## 2024-10-24 DIAGNOSIS — R6520 Severe sepsis without septic shock: Secondary | ICD-10-CM | POA: Diagnosis not present

## 2024-10-24 DIAGNOSIS — A419 Sepsis, unspecified organism: Secondary | ICD-10-CM | POA: Diagnosis not present

## 2024-10-24 LAB — CULTURE, BLOOD (ROUTINE X 2)
Culture: NO GROWTH
Culture: NO GROWTH
Special Requests: ADEQUATE

## 2024-10-24 LAB — GLUCOSE, CAPILLARY
Glucose-Capillary: 108 mg/dL — ABNORMAL HIGH (ref 70–99)
Glucose-Capillary: 111 mg/dL — ABNORMAL HIGH (ref 70–99)
Glucose-Capillary: 113 mg/dL — ABNORMAL HIGH (ref 70–99)
Glucose-Capillary: 115 mg/dL — ABNORMAL HIGH (ref 70–99)
Glucose-Capillary: 88 mg/dL (ref 70–99)
Glucose-Capillary: 91 mg/dL (ref 70–99)
Glucose-Capillary: 99 mg/dL (ref 70–99)

## 2024-10-24 LAB — CREATININE, SERUM
Creatinine, Ser: 0.7 mg/dL (ref 0.61–1.24)
GFR, Estimated: >60 mL/min

## 2024-10-24 NOTE — Progress Notes (Signed)
 " PROGRESS NOTE    Brian [name]  FMW:[id number] DOB: January 24, 1941 DOA: 10/17/2024 PCP: [name], MD    Brief Narrative:  83 year old Spanish-speaking male from [facility] DNR/DNI with past medical history of HTN, HIV on [place], CVA with right upper extremity weakness, COPD, chronic dysphagia presented with shortness of breath and cough.  Patient found to have sepsis secondary to pneumonia started on broad-spectrum antibiotics.  Patient was admitted to the ICU initially as he required pressors. Initially required Levophed  now this has been off. Respiratory panel 20 pathogen, COVID/flu/RSV negative. Echocardiogram shows EF of 55%, dilated aortic root but no obvious vegetation.  Initial cultures grew E faecalis bacteremia, repeat currently remains unremarkable. After having prolonged discussion with the family members it was decided that we will treat empirically with p.o. antibiotics amoxicillin  1 g 3 times daily for 6 weeks.  Family does understand this is not standard of care as they do not want TEE or IV antibiotics outpatient.   TOC consulted, as long as we have safe disposition we can discharge him.   Assessment & Plan:  Septic shock secondary to aspiration versus healthcare acquired pneumonia E faecalis bacteremia - Chest x-ray showing left-sided infiltrate.  Initially required Levophed  now this has been off. Respiratory panel 20 pathogen, COVID/flu/RSV negative. Echocardiogram shows EF of 55%, dilated aortic root but no obvious vegetation.  Initial cultures grew E faecalis bacteremia, repeat currently remains unremarkable - After having prolonged discussion with the family members it was decided that we will treat empirically with p.o. antibiotics amoxicillin  1 g 3 times daily for 6 weeks.  Family does understand this is not standard of care as they do not want TEE or IV antibiotics outpatient --Vitals stable, awaiting placement.   COPD -Not an active exacerbation.   Bronchodilators  Dysphagia: Risk for aspiration. On dysphagia 1 diet  Acute kidney injury: Resolved.  -Baseline creatinine 0.9, admission creatinine 3.91.   -Resolved.  -Treated with IV fluids.   HIV Continue with Triumeq .   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  Improving  Goals of care discussion done by PCCM team.  Patient remains DNR/DNI with comfort measures.  Only excepts antibiotics and IV fluids if indicated otherwise no feeding tube.  Palliative care team consulted by PCCM team  Failed speech and swallow eval. severe aspirations noted on MBS.  Recommended against any advance feeding tube, family agrees.  DVT prophylaxis: SQ Hep Code Status= DNR/DNI Family Communication: No family at bedside Status is: Inpatient Remains inpatient appropriate because: Notified TOC the patient is ready for discharge as long as we have safe disposition.  Insurance authorization   PT Follow up Recs:   Subjective: Alert, denies pain.  Examination:  General exam: Frail, no acute distress Respiratory system: Clear to auscultation Cardiovascular system: S1, S2 regular rhythm and rate Gastrointestinal system: Sounds present, soft nontender nondistended Central nervous system: Alert answer questions Extremities: no Edema     Wound 10/18/24 0400 Pressure Injury Sacrum Bilateral Stage 1 -  Intact skin with non-blanchable redness of a localized area usually over a bony prominence. (Active)     Diet Orders (From admission, onward)     Start     Ordered   10/22/24  1302  DIET - DYS 1 Room service appropriate? Yes; Fluid consistency: Pudding Thick  Diet effective now       Comments: Okay for  comfort feed with family  Question Answer Comment  Room service appropriate? Yes   Fluid consistency: Pudding Thick      10/22/24 1301            Objective: Vitals:   10/23/24 2207 10/24/24 0500 10/24/24 0546 10/24/24 1003  BP:   (!) 152/82   Pulse:   71 61  Resp:   18   Temp:   98.9 F (37.2 C)   TempSrc:      SpO2: 97%  96% 96%  Weight:  62 kg    Height:        Intake/Output Summary (Last 24 hours) at 10/24/2024 1337 Last data filed at 10/24/2024 9361 Gross per 24 hour  Intake 1521.71 ml  Output 600 ml  Net 921.71 ml   Filed Weights   10/22/24 0500 10/23/24 0438 10/24/24 0500  Weight: 59.5 kg 59.8 kg 62 kg    Scheduled Meds:  abacavir -dolutegravir -lamiVUDine   1 tablet Oral Daily   arformoterol   15 mcg Nebulization BID   budesonide  (PULMICORT ) nebulizer solution  0.25 mg Nebulization BID   Chlorhexidine  Gluconate Cloth  6 each Topical QHS   clopidogrel   75 mg Oral Daily   dutasteride   0.5 mg Oral Daily   ezetimibe   10 mg Oral Daily   heparin   5,000 Units Subcutaneous Q8H   insulin  aspart  0-9 Units Subcutaneous Q4H   loratadine   10 mg Oral Daily   metoprolol  succinate  25 mg Oral Daily   mouth rinse  15 mL Mouth Rinse 4 times per day   revefenacin   175 mcg Nebulization Daily   tamsulosin   0.4 mg Oral Daily   Continuous Infusions:  ampicillin -sulbactam (UNASYN ) IV 3 g (10/24/24 1243)    Nutritional status     Body mass index is 21.41 kg/m.  Data Reviewed:   CBC: Recent Labs  Lab 10/17/24 2009 10/18/24 0148 10/21/24 0524  WBC 18.7* 15.2* 7.4  NEUTROABS 17.4*  --   --   HGB 13.9 12.0* 12.6*  HCT 41.5 34.7* 35.4*  MCV 96.7 95.1 91.9  PLT 100* 78* 101*   Basic Metabolic Panel: Recent Labs  Lab 10/17/24 2009 10/18/24 0148 10/19/24 0844 10/20/24 0258 10/24/24 0550  NA 141 138 141 139  --   K 4.5 4.3 4.1 3.6  --   CL 103 105 111 108  --   CO2 17* 17* 22 23  --   GLUCOSE 131* 99 78 87  --   BUN 48* 47* 21 12  --   CREATININE 3.91*  3.29* 0.84 0.76 0.70  CALCIUM  9.0 8.3* 8.3* 7.7*  --   MG  --  1.4* 2.4 2.3  --   PHOS  --  4.6 2.2*  --   --    GFR: Estimated Creatinine Clearance: 61.4 mL/min (by C-G formula based on SCr of 0.7 mg/dL). Liver Function Tests: Recent Labs  Lab 10/17/24 2009  AST 86*  ALT 21  ALKPHOS 54  BILITOT 1.2  PROT 6.6  ALBUMIN 3.4*   No results for input(s): LIPASE, AMYLASE in the last 168 hours. No results for input(s): AMMONIA in the last 168 hours. Coagulation Profile: Recent Labs  Lab 10/17/24 2009  INR 1.4*   Cardiac Enzymes: No results for input(s): CKTOTAL, CKMB, CKMBINDEX, TROPONINI in the last 168 hours. BNP (last 3  results) No results for input(s): PROBNP in the last 8760 hours. HbA1C: No results for input(s): HGBA1C in the last 72 hours. CBG: Recent Labs  Lab 10/23/24 2000 10/24/24 0045 10/24/24 0628 10/24/24 0756 10/24/24 1118  GLUCAP 100* 91 108* 115* 113*   Lipid Profile: No results for input(s): CHOL, HDL, LDLCALC, TRIG, CHOLHDL, LDLDIRECT in the last 72 hours. Thyroid Function Tests: No results for input(s): TSH, T4TOTAL, FREET4, T3FREE, THYROIDAB in the last 72 hours. Anemia Panel: No results for input(s): VITAMINB12, FOLATE, FERRITIN, TIBC, IRON, RETICCTPCT in the last 72 hours. Sepsis Labs: Recent Labs  Lab 10/17/24 2222 10/18/24 0140 10/18/24 0449 10/18/24 0826  LATICACIDVEN 8.0* 4.7* 2.7* 1.6    Recent Results (from the past 240 hours)  Resp panel by RT-PCR (RSV, Flu A&B, Covid) Anterior Nasal Swab     Status: None   Collection Time: 10/17/24  8:16 PM   Specimen: Anterior Nasal Swab  Result Value Ref Range Status   SARS Coronavirus 2 by RT PCR NEGATIVE NEGATIVE Final   Influenza A by PCR NEGATIVE NEGATIVE Final   Influenza B by PCR NEGATIVE NEGATIVE Final    Comment: (NOTE) The Xpert Xpress SARS-CoV-2/FLU/RSV plus assay is intended as an aid in the diagnosis of influenza from  Nasopharyngeal swab specimens and should not be used as a sole basis for treatment. Nasal washings and aspirates are unacceptable for Xpert Xpress SARS-CoV-2/FLU/RSV testing.  Fact Sheet for Patients: bloggercourse.com  Fact Sheet for Healthcare Providers: seriousbroker.it  This test is not yet approved or cleared by the [place]  FDA and has been authorized for detection and/or diagnosis of SARS-CoV-2 by FDA under an Emergency Use Authorization (EUA). This EUA will remain in effect (meaning this test can be used) for the duration of the COVID-19 declaration under Section 564(b)(1) of the Act, 21 U.S.C. section 360bbb-3(b)(1), unless the authorization is terminated or revoked.     Resp Syncytial Virus by PCR NEGATIVE NEGATIVE Final    Comment: (NOTE) Fact Sheet for Patients: bloggercourse.com  Fact Sheet for Healthcare Providers: seriousbroker.it  This test is not yet approved or cleared by the [place]  FDA and has been authorized for detection and/or diagnosis of SARS-CoV-2 by FDA under an Emergency Use Authorization (EUA). This EUA will remain in effect (meaning this test can be used) for the duration of the COVID-19 declaration under Section 564(b)(1) of the Act, 21 U.S.C. section 360bbb-3(b)(1), unless the authorization is terminated or revoked.  Performed at [facility], [address] [place], [place] [address]   Blood Culture (routine x 2)     Status: Abnormal   Collection Time: 10/17/24  8:16 PM   Specimen: BLOOD RIGHT FOREARM  Result Value Ref Range Status   Specimen Description BLOOD RIGHT FOREARM  Final   Special Requests   Final    BOTTLES DRAWN AEROBIC ONLY Blood Culture results may not be optimal due to an inadequate volume of blood received in culture bottles   Culture  Setup Time   Final    GRAM POSITIVE COCCI AEROBIC BOTTLE ONLY CRITICAL  VALUE NOTED.  VALUE IS CONSISTENT WITH PREVIOUSLY REPORTED AND CALLED VALUE.    Culture (A)  Final    AEROCOCCUS URINAE Standardized susceptibility testing for this organism is not available. Performed at [facility], [address] [place], [place] [address]    Report Status 10/21/2024 FINAL  Final  Blood Culture (routine x 2)     Status: Abnormal   Collection Time: 10/17/24  8:21 PM   Specimen: BLOOD  Result Value Ref Range Status   Specimen Description BLOOD RIGHT ANTECUBITAL  Final   Special Requests   Final    BOTTLES DRAWN AEROBIC AND ANAEROBIC Blood Culture adequate volume   Culture  Setup Time   Final    GRAM POSITIVE COCCI IN BOTH AEROBIC AND ANAEROBIC BOTTLES CRITICAL RESULT CALLED TO, READ BACK BY AND VERIFIED WITH: [facility]  PHARMD 10/19/2024 @ 0335 BY DD    Culture (A)  Final    AEROCOCCUS URINAE ENTEROCOCCUS FAECALIS VANCOMYCIN  RESISTANT ENTEROCOCCUS ISOLATED CRITICAL RESULT CALLED TO, READ BACK BY AND VERIFIED WITH: MAYA Karleen MATSU [phone] fcp Performed at [facility], [address], [place], [place] [address]    Report Status 10/24/2024 FINAL  Final   Organism ID, Bacteria ENTEROCOCCUS FAECALIS  Final      Susceptibility   Enterococcus faecalis - MIC*    AMPICILLIN  <=2 SENSITIVE Sensitive     VANCOMYCIN  >=32 RESISTANT Resistant     GENTAMICIN  SYNERGY SENSITIVE Sensitive     * ENTEROCOCCUS FAECALIS  Blood Culture ID Panel (Reflexed)     Status: None   Collection Time: 10/17/24  8:21 PM  Result Value Ref Range Status   Enterococcus faecalis NOT DETECTED NOT DETECTED Final   Enterococcus Faecium NOT DETECTED NOT DETECTED Final   Listeria monocytogenes NOT DETECTED NOT DETECTED Final   Staphylococcus species NOT DETECTED NOT DETECTED Final   Staphylococcus aureus (BCID) NOT DETECTED NOT DETECTED Final   Staphylococcus epidermidis NOT DETECTED NOT DETECTED Final   Staphylococcus lugdunensis NOT DETECTED NOT DETECTED Final   Streptococcus  species NOT DETECTED NOT DETECTED Final   Streptococcus agalactiae NOT DETECTED NOT DETECTED Final   Streptococcus pneumoniae NOT DETECTED NOT DETECTED Final   Streptococcus pyogenes NOT DETECTED NOT DETECTED Final   A.calcoaceticus-baumannii NOT DETECTED NOT DETECTED Final   Bacteroides fragilis NOT DETECTED NOT DETECTED Final   Enterobacterales NOT DETECTED NOT DETECTED Final   Enterobacter cloacae complex NOT DETECTED NOT DETECTED Final   Escherichia coli NOT DETECTED NOT DETECTED Final   Klebsiella aerogenes NOT DETECTED NOT DETECTED Final   Klebsiella oxytoca NOT DETECTED NOT DETECTED Final   Klebsiella pneumoniae NOT DETECTED NOT DETECTED Final   Proteus species NOT DETECTED NOT DETECTED Final   Salmonella species NOT DETECTED NOT DETECTED Final   Serratia marcescens NOT DETECTED NOT DETECTED Final   Haemophilus influenzae NOT DETECTED NOT DETECTED Final   Neisseria meningitidis NOT DETECTED NOT DETECTED Final   Pseudomonas aeruginosa NOT DETECTED NOT DETECTED Final   Stenotrophomonas maltophilia NOT DETECTED NOT DETECTED Final   Candida albicans NOT DETECTED NOT DETECTED Final   Candida auris NOT DETECTED NOT DETECTED Final   Candida glabrata NOT DETECTED NOT DETECTED Final   Candida krusei NOT DETECTED NOT DETECTED Final   Candida parapsilosis NOT DETECTED NOT DETECTED Final   Candida tropicalis NOT DETECTED NOT DETECTED Final   Cryptococcus neoformans/gattii NOT DETECTED NOT DETECTED Final    Comment: Performed at [facility], [address] [place], [place] [address]  MRSA Next Gen by PCR, Nasal     Status: None   Collection Time: 10/18/24 12:44 AM   Specimen: Nasal Mucosa; Nasal Swab  Result Value Ref Range Status   MRSA by PCR Next Gen NOT DETECTED NOT DETECTED Final    Comment: (NOTE) The GeneXpert MRSA Assay (FDA approved for NASAL specimens only), is one component of a comprehensive MRSA colonization surveillance program. It is not  intended to diagnose MRSA  infection nor to guide or monitor treatment for MRSA infections. Test performance is not FDA approved in patients less than 29 years old. Performed at [facility], [address], [place], [place] [address]   Urine Culture     Status: Abnormal   Collection Time: 10/18/24 12:55 AM   Specimen: Urine, Random  Result Value Ref Range Status   Specimen Description URINE, RANDOM  Final   Special Requests   Final    NONE Reflexed from [id number] Performed at [facility], [address] [place], [place] [address]    Culture MULTIPLE SPECIES PRESENT, SUGGEST RECOLLECTION (A)  Final   Report Status 10/19/2024 FINAL  Final  Respiratory (~20 pathogens) panel by PCR     Status: None   Collection Time: 10/18/24  9:59 AM   Specimen: Nasopharyngeal Swab; Respiratory  Result Value Ref Range Status   Adenovirus NOT DETECTED NOT DETECTED Final   Coronavirus 229E NOT DETECTED NOT DETECTED Final    Comment: (NOTE) The Coronavirus on the Respiratory Panel, DOES NOT test for the novel  Coronavirus (2019 nCoV)    Coronavirus HKU1 NOT DETECTED NOT DETECTED Final   Coronavirus NL63 NOT DETECTED NOT DETECTED Final   Coronavirus OC43 NOT DETECTED NOT DETECTED Final   Metapneumovirus NOT DETECTED NOT DETECTED Final   Rhinovirus / Enterovirus NOT DETECTED NOT DETECTED Final   Influenza A NOT DETECTED NOT DETECTED Final   Influenza B NOT DETECTED NOT DETECTED Final   Parainfluenza Virus 1 NOT DETECTED NOT DETECTED Final   Parainfluenza Virus 2 NOT DETECTED NOT DETECTED Final   Parainfluenza Virus 3 NOT DETECTED NOT DETECTED Final   Parainfluenza Virus 4 NOT DETECTED NOT DETECTED Final   Respiratory Syncytial Virus NOT DETECTED NOT DETECTED Final   Bordetella pertussis NOT DETECTED NOT DETECTED Final   Bordetella Parapertussis NOT DETECTED NOT DETECTED Final   Chlamydophila pneumoniae NOT DETECTED NOT DETECTED Final   Mycoplasma pneumoniae NOT DETECTED NOT DETECTED Final    Comment: Performed at  [facility], [address] [place], [place] [address]  Culture, blood (Routine X 2) w Reflex to ID Panel     Status: None   Collection Time: 10/19/24  8:44 AM   Specimen: BLOOD LEFT HAND  Result Value Ref Range Status   Specimen Description   Final    BLOOD LEFT HAND Performed at [facility], [address] [place], [place] [address]    Special Requests   Final    BOTTLES DRAWN AEROBIC AND ANAEROBIC Blood Culture results may not be optimal due to an inadequate volume of blood received in culture bottles Performed at [facility], [address], [place], [place] [address]    Culture   Final    NO GROWTH 5 DAYS Performed at [facility], [address] [place], [place] [address]    Report Status 10/24/2024 FINAL  Final  Culture, blood (Routine X 2) w Reflex to ID Panel     Status: None   Collection Time: 10/19/24  8:46 AM   Specimen: BLOOD LEFT HAND  Result Value Ref Range Status   Specimen Description   Final    BLOOD LEFT HAND Performed at [facility], [address] [place], [place] [address]    Special Requests   Final    BOTTLES DRAWN AEROBIC AND ANAEROBIC Blood Culture results may not be optimal due to an inadequate volume  of blood received in culture bottles Performed at [facility], [address] [place], [place] [address]    Culture   Final    NO GROWTH 5 DAYS Performed at [facility], [address], [place], [place] [address]    Report Status 10/24/2024 FINAL  Final         Radiology Studies: No results found.         LOS: 6 days   Time spent= 35 mins    Owen DELENA Lore, MD [facility]  If 7PM-7AM, please contact night-coverage  10/24/2024, 1:37 PM  "

## 2024-10-24 NOTE — Progress Notes (Signed)
 "                                                                                                                                                                                                          Daily Progress Note   Patient Name: Brian [name]       Date: 10/24/2024 DOB: 1940-12-30  Age: 83 y.o. MRN#: [id number] Attending Physician: Madelyne Owen LABOR, MD Primary Care Physician: [name], MD Admit Date: 10/17/2024  Reason for Consultation/Follow-up: Establishing goals of care  Subjective: Resting comfortably in bed, no distress, tolerating PO,  Denies any acute complaints this am.  TOC working on placement, no family on bedside.   Length of Stay: 6  Current Medications: Scheduled Meds:   abacavir -dolutegravir -lamiVUDine   1 tablet Oral Daily   arformoterol   15 mcg Nebulization BID   budesonide  (PULMICORT ) nebulizer solution  0.25 mg Nebulization BID   Chlorhexidine  Gluconate Cloth  6 each Topical QHS   clopidogrel   75 mg Oral Daily   dutasteride   0.5 mg Oral Daily   ezetimibe   10 mg Oral Daily   heparin   5,000 Units Subcutaneous Q8H   insulin  aspart  0-9 Units Subcutaneous Q4H   loratadine   10 mg Oral Daily   metoprolol  succinate  25 mg Oral Daily   mouth rinse  15 mL Mouth Rinse 4 times per day   revefenacin   175 mcg Nebulization Daily   tamsulosin   0.4 mg Oral Daily    Continuous Infusions:  sodium chloride  50 mL/hr at 10/23/24 1555   ampicillin -sulbactam (UNASYN ) IV 3 g (10/24/24 9361)   vancomycin  Stopped (10/23/24 1440)    PRN Meds: acetaminophen , dextrose , glucagon  (human recombinant), guaiFENesin , hydrALAZINE , ipratropium-albuterol , metoprolol  tartrate, ondansetron  (ZOFRAN ) IV, mouth rinse, senna-docusate  Physical Exam         Awake alert Resting in bed Appears chronically ill No overt signs of aspiration at present.   Vital Signs: BP (!) 152/82 (BP Location: Left Arm)   Pulse 61   Temp 98.9 F (37.2 C)   Resp 18   Ht 5' 7 (1.702 m)    Wt 62 kg   SpO2 96%   BMI 21.41 kg/m  SpO2: SpO2: 96 % O2 Device: O2 Device: Room Air O2 Flow Rate: O2 Flow Rate (L/min): 1 L/min  Intake/output summary:  Intake/Output Summary (Last 24 hours) at 10/24/2024 1028 Last data filed at 10/24/2024 9361 Gross per 24 hour  Intake 1761.71 ml  Output 1000 ml  Net 761.71 ml   LBM: Last BM Date : 10/17/24 Baseline Weight:  Weight: 59.4 kg (Wt from 09/21/2024) Most recent weight: Weight: 62 kg       Palliative Assessment/Data:      Patient Active Problem List   Diagnosis Date Noted   Severe sepsis (HCC) 10/18/2024   Urinary retention 10/18/2024   Pneumonia of left lung due to infectious organism 10/18/2024   Pressure injury of skin 10/18/2024   Fall 09/21/2024   Rhabdomyolysis 09/21/2024   Closed fracture of one rib of left side 11/29/2023   Generalized weakness 11/28/2023   UTI (urinary tract infection) 11/28/2023   Acute metabolic encephalopathy 11/28/2023   Sepsis (HCC) 07/17/2023   History of stroke 04/25/2022   Hyperlipidemia    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    Depression with anxiety    Left hip pain    Essential hypertension 08/31/2020   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   Peripheral neuropathy 05/03/2020   COPD (chronic obstructive pulmonary disease) (HCC) 11/27/2018   AKI (acute kidney injury) 07/30/2015   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   Sleep apnea 07/30/2015   HIV (human immunodeficiency virus infection) (HCC) 07/30/2015   Depression 07/30/2015   BPH (benign prostatic hyperplasia) 07/30/2015    Palliative Care Assessment & Plan   Patient Profile:    Assessment: The patient is an 83 year old Spanish-speaking male from [facility] SN[facility] with DNR/DNI status and significant medical history including hypertension, HIV on HAART, 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 He presented with shortness of breath and cough and was found to be in septic shock  secondary to pneumonia (aspiration versus healthcare-associated). He initially required ICU admission and vasopressor support, which has since been discontinued. Blood cultures grew Enterococcus faecalis bacteremia. Chest X-ray showed a left-sided infiltrate, respiratory viral testing was negative, and echocardiogram demonstrated preserved EF (55%) without evidence of vegetation. Antibiotics were adjusted per Infectious Disease to vancomycin  and Unasyn .  The hospital course has been complicated by severe dysphagia with failed speech and swallow evaluation and significant aspiration noted on modified barium swallow. Acute kidney injury present on admission has resolved. COPD is not in acute exacerbation. The patient remains DNR/DNI. A goals-of-care discussion was previously conducted by the [facility] team, and palliative care was consulted for ongoing support and guidance.   Recommendations/Plan:  return to [facility] with oral antibiotics, outpatient palliative care follow-up, and a continued focus on comfort-based goals aligned with the patients DNR/DNI status and with the goals to shift to hospice care in the upcoming weeks.   Goals of Care and Additional Recommendations: Limitations on Scope of Treatment: No Artificial Feeding  Code Status:    Code Status Orders  (From admission, onward)           Start     Ordered   10/18/24 0901  Do not attempt resuscitation (DNR)- Limited -Do Not Intubate (DNI)  (Code Status)  Continuous       Question Answer Comment  If pulseless and not breathing No CPR or chest compressions.   In Pre-Arrest Conditions (Patient Is Breathing and Has A Pulse) Do not intubate. Provide all appropriate non-invasive medical interventions. Avoid ICU transfer unless indicated or required.   Consent: Discussion documented in EHR or advanced directives reviewed      10/18/24 0900           Code Status History     Date Active Date Inactive Code Status Order ID Comments  User Context   10/18/2024 0045 10/18/2024 0900 Do not attempt resuscitation (DNR) - Comfort care [id number]  Maree Harder, MD ED   09/21/2024 1828 09/27/2024 1958 Limited: Do not attempt resuscitation (DNR) -DNR-LIMITED -Do Not Intubate/DNI  [id number]  Lanetta Lingo, MD ED   09/21/2024 1826 09/21/2024 1828 Full Code [id number]  Lanetta Lingo, MD ED   11/28/2023 0217 12/02/2023 1621 Limited: Do not attempt resuscitation (DNR) -DNR-LIMITED -Do Not Intubate/DNI  [id number]  Cleatus Delayne GAILS, MD ED   07/17/2023 0056 07/21/2023 1651 Limited: Do not attempt resuscitation (DNR) -DNR-LIMITED -Do Not Intubate/DNI  [id number]  Hilma Rankins, MD ED   04/25/2022 1017 05/04/2022 2130 DNR [id number]  Hilma Rankins, MD ED   04/25/2022 0959 04/25/2022 1017 Full Code [id number]  Hilma Rankins, MD ED   07/13/2021 2026 07/18/2021 1710 Full Code [id number]  Shona Terry SAILOR, DO ED   07/31/2015 0034 08/03/2015 1819 Full Code [id number]  Jess Jonne SAILOR, MD Inpatient      Advance Directive Documentation    Flowsheet Row Most Recent Value  Type of Advance Directive Out of facility DNR (pink MOST or yellow form)  Pre-existing out of facility DNR order (yellow form or pink MOST form) Physician notified to receive inpatient order, Pink MOST form placed in chart (order not valid for inpatient use)  MOST Form in Place? --    Prognosis:  < 12 months  Discharge Planning: Emmalene Hertz with palliative  Care plan was discussed with  IDT RN Thank you for allowing the Palliative Medicine Team to assist in the care of this patient.  Mod MDM.      Greater than 50%  of this time was spent counseling and coordinating care related to the above assessment and plan.  Lonia Serve, MD  Please contact Palliative Medicine Team phone at [phone] for questions and concerns.       "

## 2024-10-25 ENCOUNTER — Other Ambulatory Visit: Payer: Self-pay

## 2024-10-25 DIAGNOSIS — R6520 Severe sepsis without septic shock: Secondary | ICD-10-CM | POA: Diagnosis not present

## 2024-10-25 DIAGNOSIS — A419 Sepsis, unspecified organism: Secondary | ICD-10-CM | POA: Diagnosis not present

## 2024-10-25 LAB — PHOSPHORUS: Phosphorus: 3.2 mg/dL (ref 2.5–4.6)

## 2024-10-25 LAB — GLUCOSE, CAPILLARY
Glucose-Capillary: 110 mg/dL — ABNORMAL HIGH (ref 70–99)
Glucose-Capillary: 110 mg/dL — ABNORMAL HIGH (ref 70–99)
Glucose-Capillary: 125 mg/dL — ABNORMAL HIGH (ref 70–99)
Glucose-Capillary: 93 mg/dL (ref 70–99)
Glucose-Capillary: 97 mg/dL (ref 70–99)

## 2024-10-25 LAB — BASIC METABOLIC PANEL WITH GFR
Anion gap: 12 (ref 5–15)
BUN: 8 mg/dL (ref 8–23)
CO2: 25 mmol/L (ref 22–32)
Calcium: 8.5 mg/dL — ABNORMAL LOW (ref 8.9–10.3)
Chloride: 105 mmol/L (ref 98–111)
Creatinine, Ser: 0.69 mg/dL (ref 0.61–1.24)
GFR, Estimated: >60 mL/min
Glucose, Bld: 84 mg/dL (ref 70–99)
Potassium: 3.1 mmol/L — ABNORMAL LOW (ref 3.5–5.1)
Sodium: 142 mmol/L (ref 135–145)

## 2024-10-25 MED ORDER — ARFORMOTEROL TARTRATE 15 MCG/2ML IN NEBU: 15.0000 ug | INHALATION_SOLUTION | Freq: Two times a day (BID) | RESPIRATORY_TRACT | 0 refills | Status: AC

## 2024-10-25 MED ORDER — BUDESONIDE 0.25 MG/2ML IN SUSP: 0.2500 mg | Freq: Two times a day (BID) | RESPIRATORY_TRACT | 12 refills | Status: AC

## 2024-10-25 MED ORDER — AMPICILLIN 500 MG PO CAPS: 1000.0000 mg | ORAL_CAPSULE | Freq: Three times a day (TID) | ORAL | 0 refills | Status: DC

## 2024-10-25 MED ORDER — AMOXICILLIN-POT CLAVULANATE 875-125 MG PO TABS: 1.0000 | ORAL_TABLET | Freq: Two times a day (BID) | ORAL | 0 refills | Status: DC

## 2024-10-25 MED ORDER — IPRATROPIUM-ALBUTEROL 0.5-2.5 (3) MG/3ML IN SOLN: 3.0000 mL | RESPIRATORY_TRACT | 0 refills | Status: AC | PRN

## 2024-10-25 MED ORDER — POTASSIUM CHLORIDE CRYS ER 20 MEQ PO TBCR
40.0000 meq | EXTENDED_RELEASE_TABLET | Freq: Once | ORAL | Status: DC
  Filled 2024-10-25: qty 2

## 2024-10-25 MED ORDER — AMOXICILLIN 500 MG PO CAPS: 1000.0000 mg | ORAL_CAPSULE | Freq: Three times a day (TID) | ORAL | 1 refills | Status: AC

## 2024-10-25 MED ORDER — REVEFENACIN 175 MCG/3ML IN SOLN: 175.0000 ug | Freq: Every day | RESPIRATORY_TRACT | 0 refills | Status: AC

## 2024-10-25 NOTE — Progress Notes (Signed)
 "                                                                                                                                                                                                          Daily Progress Note   Patient Name: Brian [name]       Date: 10/25/2024 DOB: 02/01/1941  Age: 83 y.o. MRN#: [id number] Attending Physician: Madelyne Owen LABOR, MD Primary Care Physician: [name], MD Admit Date: 10/17/2024  Reason for Consultation/Follow-up: Establishing goals of care  Subjective: Resting comfortably in bed, no distress, continue to monitor PO,  Denies any acute complaints this am.  TOC working on placement, no family on bedside.   Length of Stay: 7  Current Medications: Scheduled Meds:   abacavir -dolutegravir -lamiVUDine   1 tablet Oral Daily   arformoterol   15 mcg Nebulization BID   budesonide  (PULMICORT ) nebulizer solution  0.25 mg Nebulization BID   Chlorhexidine  Gluconate Cloth  6 each Topical QHS   clopidogrel   75 mg Oral Daily   dutasteride   0.5 mg Oral Daily   ezetimibe   10 mg Oral Daily   heparin   5,000 Units Subcutaneous Q8H   insulin  aspart  0-9 Units Subcutaneous Q4H   loratadine   10 mg Oral Daily   metoprolol  succinate  25 mg Oral Daily   mouth rinse  15 mL Mouth Rinse 4 times per day   potassium chloride   40 mEq Oral Once   revefenacin   175 mcg Nebulization Daily   tamsulosin   0.4 mg Oral Daily    Continuous Infusions:  ampicillin -sulbactam (UNASYN ) IV 3 g (10/25/24 0823)    PRN Meds: acetaminophen , dextrose , glucagon  (human recombinant), guaiFENesin , hydrALAZINE , ipratropium-albuterol , metoprolol  tartrate, ondansetron  (ZOFRAN ) IV, mouth rinse, senna-docusate  Physical Exam         Awakens easily no distress Resting in bed Appears chronically ill No overt signs of aspiration at present.   Vital Signs: BP 139/71 (BP Location: Right Arm)   Pulse 73   Temp 98 F (36.7 C) (Oral)   Resp 18   Ht 5' 7 (1.702 m)    Wt 61.3 kg   SpO2 95%   BMI 21.17 kg/m  SpO2: SpO2: 95 % O2 Device: O2 Device: Room Air O2 Flow Rate: O2 Flow Rate (L/min): 1 L/min  Intake/output summary:  Intake/Output Summary (Last 24 hours) at 10/25/2024 0925 Last data filed at 10/25/2024 0459 Gross per 24 hour  Intake 496.63 ml  Output 2050 ml  Net -1553.37 ml   LBM: Last BM Date : 10/17/24 Baseline Weight: Weight: 59.4  kg (Wt from 09/21/2024) Most recent weight: Weight: 61.3 kg       Palliative Assessment/Data:      Patient Active Problem List   Diagnosis Date Noted   Severe sepsis (HCC) 10/18/2024   Urinary retention 10/18/2024   Pneumonia of left lung due to infectious organism 10/18/2024   Pressure injury of skin 10/18/2024   Fall 09/21/2024   Rhabdomyolysis 09/21/2024   Closed fracture of one rib of left side 11/29/2023   Generalized weakness 11/28/2023   UTI (urinary tract infection) 11/28/2023   Acute metabolic encephalopathy 11/28/2023   Sepsis (HCC) 07/17/2023   History of stroke 04/25/2022   Hyperlipidemia    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    Depression with anxiety    Left hip pain    Essential hypertension 08/31/2020   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   Peripheral neuropathy 05/03/2020   COPD (chronic obstructive pulmonary disease) (HCC) 11/27/2018   AKI (acute kidney injury) 07/30/2015   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   Sleep apnea 07/30/2015   HIV (human immunodeficiency virus infection) (HCC) 07/30/2015   Depression 07/30/2015   BPH (benign prostatic hyperplasia) 07/30/2015    Palliative Care Assessment & Plan   Patient Profile:    Assessment: The patient is an 83 year old Spanish-speaking male from [facility] SN[facility] with DNR/DNI status and significant medical history including hypertension, HIV on HAART, 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 He presented with shortness of breath and cough and  was found to be in septic shock secondary to pneumonia (aspiration versus healthcare-associated). He initially required ICU admission and vasopressor support, which has since been discontinued. Blood cultures grew Enterococcus faecalis bacteremia. Chest X-ray showed a left-sided infiltrate, respiratory viral testing was negative, and echocardiogram demonstrated preserved EF (55%) without evidence of vegetation. Antibiotics were adjusted per Infectious Disease to vancomycin  and Unasyn .  The hospital course has been complicated by severe dysphagia with failed speech and swallow evaluation and significant aspiration noted on modified barium swallow. Acute kidney injury present on admission has resolved. COPD is not in acute exacerbation. The patient remains DNR/DNI. A goals-of-care discussion was previously conducted by the [facility] team, and palliative care was consulted for ongoing support and guidance.   Recommendations/Plan:  return to [facility] with oral antibiotics, outpatient palliative care follow-up, and a continued focus on comfort-based goals aligned with the patients DNR/DNI status and with the goals to shift to hospice care in the upcoming weeks.   Goals of Care and Additional Recommendations: Limitations on Scope of Treatment: No Artificial Feeding  Code Status:    Code Status Orders  (From admission, onward)           Start     Ordered   10/18/24 0901  Do not attempt resuscitation (DNR)- Limited -Do Not Intubate (DNI)  (Code Status)  Continuous       Question Answer Comment  If pulseless and not breathing No CPR or chest compressions.   In Pre-Arrest Conditions (Patient Is Breathing and Has A Pulse) Do not intubate. Provide all appropriate non-invasive medical interventions. Avoid ICU transfer unless indicated or required.   Consent: Discussion documented in EHR or advanced directives reviewed      10/18/24 0900           Code Status History     Date Active Date  Inactive Code Status Order ID Comments User Context   10/18/2024 0045 10/18/2024 0900 Do not attempt resuscitation (DNR) - Comfort care [id number]  Maree,  Orlin, MD ED   09/21/2024 1828 09/27/2024 1958 Limited: Do not attempt resuscitation (DNR) -DNR-LIMITED -Do Not Intubate/DNI  [id number]  Lanetta Lingo, MD ED   09/21/2024 1826 09/21/2024 1828 Full Code [id number]  Lanetta Lingo, MD ED   11/28/2023 0217 12/02/2023 1621 Limited: Do not attempt resuscitation (DNR) -DNR-LIMITED -Do Not Intubate/DNI  [id number]  Cleatus Delayne GAILS, MD ED   07/17/2023 0056 07/21/2023 1651 Limited: Do not attempt resuscitation (DNR) -DNR-LIMITED -Do Not Intubate/DNI  [id number]  Hilma Rankins, MD ED   04/25/2022 1017 05/04/2022 2130 DNR [id number]  Hilma Rankins, MD ED   04/25/2022 0959 04/25/2022 1017 Full Code [id number]  Hilma Rankins, MD ED   07/13/2021 2026 07/18/2021 1710 Full Code [id number]  Shona Terry SAILOR, DO ED   07/31/2015 0034 08/03/2015 1819 Full Code [id number]  Jess Jonne SAILOR, MD Inpatient      Advance Directive Documentation    Flowsheet Row Most Recent Value  Type of Advance Directive Out of facility DNR (pink MOST or yellow form)  Pre-existing out of facility DNR order (yellow form or pink MOST form) Physician notified to receive inpatient order, Pink MOST form placed in chart (order not valid for inpatient use)  MOST Form in Place? --    Prognosis:  < 12 months  Discharge Planning: Emmalene Hertz with palliative  Care plan was discussed with  IDT   Thank you for allowing the Palliative Medicine Team to assist in the care of this patient.  Mod MDM.      Greater than 50%  of this time was spent counseling and coordinating care related to the above assessment and plan.  Lonia Serve, MD  Please contact Palliative Medicine Team phone at [phone] for questions and concerns.       "

## 2024-10-25 NOTE — [place] FL2 (Signed)
 " [place]  MEDICAID FL2 LEVEL OF CARE FORM     IDENTIFICATION  Patient Name: Brian [name] Birthdate: [birth year] Sex: male Admission Date (Current Location): 10/17/2024  [place] and [place] Number:  [place] [id number] Facility and Address:  [facility],  [address], [place] [address]      Provider Number: [id number]  Attending Physician Name and Address:  Madelyne Owen LABOR, MD  Relative Name and Phone Number:  Jereld Devincentis(Son) [phone]    Current Level of Care: Hospital Recommended Level of Care: Skilled Nursing Facility Prior Approval Number:    Date Approved/Denied:   PASRR Number: [id number]  Discharge Plan: SNF    Current Diagnoses: Patient Active Problem List   Diagnosis Date Noted   Severe sepsis (HCC) 10/18/2024   Urinary retention 10/18/2024   Pneumonia of left lung due to infectious organism 10/18/2024   Pressure injury of skin 10/18/2024   Fall 09/21/2024   Rhabdomyolysis 09/21/2024   Closed fracture of one rib of left side 11/29/2023   Generalized weakness 11/28/2023   UTI (urinary tract infection) 11/28/2023   Acute metabolic encephalopathy 11/28/2023   Sepsis (HCC) 07/17/2023   History of stroke 04/25/2022   Hyperlipidemia    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    Depression with anxiety    Left hip pain    Essential hypertension 08/31/2020   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   Peripheral neuropathy 05/03/2020   COPD (chronic obstructive pulmonary disease) (HCC) 11/27/2018   AKI (acute kidney injury) 07/30/2015   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   Sleep apnea 07/30/2015   HIV (human immunodeficiency virus infection) (HCC) 07/30/2015   Depression 07/30/2015   BPH (benign prostatic hyperplasia) 07/30/2015    Orientation RESPIRATION BLADDER Height & Weight     Self, Time, Situation, Place  Normal Incontinent Weight: 61.3 kg Height:  5' 7 (170.2 cm)  BEHAVIORAL SYMPTOMS/MOOD NEUROLOGICAL BOWEL NUTRITION  STATUS      Incontinent Diet (Dysphagia 1)  AMBULATORY STATUS COMMUNICATION OF NEEDS Skin   Limited Assist   Normal                       Personal Care Assistance Level of Assistance  Bathing, Feeding, Dressing Bathing Assistance: Limited assistance Feeding assistance: Limited assistance Dressing Assistance: Limited assistance     Functional Limitations Info  Sight, Hearing, Speech Sight Info: Adequate Hearing Info: Adequate Speech Info: Adequate    SPECIAL CARE FACTORS FREQUENCY  PT (By licensed PT), OT (By licensed OT)     PT Frequency: 5x week OT Frequency: 5x week            Contractures Contractures Info: Not present    Additional Factors Info  Code Status, Allergies Code Status Info: DNR Allergies Info: NKDA           Current Medications (10/25/2024):  This is the current hospital active medication list Current Facility-Administered Medications  Medication Dose Route Frequency Provider Last Rate Last Admin   abacavir -dolutegravir -lamiVUDine  (TRIUMEQ ) 600-50-300 MG per tablet 1 tablet  1 tablet Oral Daily Amin, Ankit C, MD   1 tablet at 10/24/24 1036   acetaminophen  (TYLENOL ) tablet 650 mg  650 mg Oral Q6H PRN Amin, Ankit C, MD   650 mg at 10/22/24 1800   Ampicillin -Sulbactam (UNASYN ) 3 g in sodium chloride  0.9 % 100 mL IVPB  3 g Intravenous Q6H Dennise Kingsley, MD 200 mL/hr at 10/25/24 0823 3 g at 10/25/24 0823   arformoterol  (BROVANA ) nebulizer solution 15  mcg  15 mcg Nebulization BID Wilson, Tara N, PA-C   15 mcg at 10/25/24 9264   budesonide  (PULMICORT ) nebulizer solution 0.25 mg  0.25 mg Nebulization BID Wilson, Tara N, PA-C   0.25 mg at 10/25/24 9264   Chlorhexidine  Gluconate Cloth 2 % PADS 6 each  6 each Topical QHS Maree Harder, MD   6 each at 10/24/24 0032   clopidogrel  (PLAVIX ) tablet 75 mg  75 mg Oral Daily Amin, Ankit C, MD   75 mg at 10/24/24 1034   dextrose  50 % solution 50 mL  1 ampule Intravenous Once PRN Amin, Ankit C, MD       dutasteride   (AVODART ) capsule 0.5 mg  0.5 mg Oral Daily Amin, Ankit C, MD   0.5 mg at 10/24/24 1036   ezetimibe  (ZETIA ) tablet 10 mg  10 mg Oral Daily Amin, Ankit C, MD   10 mg at 10/24/24 1035   glucagon  (human recombinant) (GLUCAGEN) injection 1 mg  1 mg Intravenous PRN Amin, Ankit C, MD       guaiFENesin  (ROBITUSSIN) 100 MG/5ML liquid 5 mL  5 mL Oral Q4H PRN Amin, Ankit C, MD       heparin  injection 5,000 Units  5,000 Units Subcutaneous Q8H Maree Harder, MD   5,000 Units at 10/25/24 9486   hydrALAZINE  (APRESOLINE ) injection 10 mg  10 mg Intravenous Q4H PRN Amin, Ankit C, MD       insulin  aspart (novoLOG ) injection 0-9 Units  0-9 Units Subcutaneous Q4H Maree Harder, MD   1 Units at 10/25/24 0513   ipratropium-albuterol  (DUONEB) 0.5-2.5 (3) MG/3ML nebulizer solution 3 mL  3 mL Nebulization Q4H PRN Amin, Ankit C, MD       loratadine  (CLARITIN ) tablet 10 mg  10 mg Oral Daily Amin, Ankit C, MD   10 mg at 10/24/24 1034   metoprolol  succinate (TOPROL -XL) 24 hr tablet 25 mg  25 mg Oral Daily Amin, Ankit C, MD   25 mg at 10/24/24 1034   metoprolol  tartrate (LOPRESSOR ) injection 5 mg  5 mg Intravenous Q4H PRN Amin, Ankit C, MD       ondansetron  (ZOFRAN ) injection 4 mg  4 mg Intravenous Q6H PRN Maree Harder, MD       Oral care mouth rinse  15 mL Mouth Rinse 4 times per day Amin, Ankit C, MD   15 mL at 10/25/24 0825   Oral care mouth rinse  15 mL Mouth Rinse PRN Amin, Ankit C, MD       potassium chloride  SA (KLOR-CON  M) CR tablet 40 mEq  40 mEq Oral Once Regalado, Belkys A, MD       revefenacin  (YUPELRI ) nebulizer solution 175 mcg  175 mcg Nebulization Daily Wilson, Tara N, PA-C   175 mcg at 10/25/24 0735   senna-docusate (Senokot-S) tablet 1 tablet  1 tablet Oral QHS PRN Amin, Ankit C, MD       tamsulosin  (FLOMAX ) capsule 0.4 mg  0.4 mg Oral Daily Amin, Ankit C, MD   0.4 mg at 10/25/24 9175     Discharge Medications: Please see discharge summary for a list of discharge medications.  Relevant Imaging  Results:  Relevant Lab Results:   Additional Information SS#[id number]  Aariana Shankland, Nathanel, RN     "

## 2024-10-25 NOTE — Discharge Summary (Addendum)
 " Physician Discharge Summary   Patient: Brian [name] MRN: [id number] DOB: 01-12-1941  Admit date:     10/17/2024  Discharge date: 10/25/2024  Discharge Physician: Owen DELENA Lore   PCP: [name], MD   Recommendations at discharge:    Needs 6 weeks of oral antibiotics.   Discharge Diagnoses: Principal Problem:   Severe sepsis (HCC) Active Problems:   Urinary retention   Pneumonia of left lung due to infectious organism   Pressure injury of skin  Resolved Problems:   * No resolved hospital problems. [facility] Course: 83 year old Spanish-speaking male from SNF [facility] DNR/DNI with past medical history of HTN, HIV on [place], CVA with right upper extremity weakness, COPD, chronic dysphagia presented with shortness of breath and cough.  Patient found to have sepsis secondary to pneumonia started on broad-spectrum antibiotics.  Patient was admitted to the ICU initially as he required pressors. Initially required Levophed  now this has been off. Respiratory panel 20 pathogen, COVID/flu/RSV negative. Echocardiogram shows EF of 55%, dilated aortic root but no obvious vegetation.  Initial cultures grew E faecalis bacteremia, repeat currently remains unremarkable. After having prolonged discussion with the family members it was decided that we will treat empirically with p.o. antibiotics amoxicillin  1 g 3 times daily for 6 weeks.  Family does understand this is not standard of care as they do not want TEE or IV antibiotics outpatient.    TOC consulted, as long as we have safe disposition we can discharge him.    Assessment and Plan: Septic shock secondary to aspiration versus healthcare acquired pneumonia E faecalis bacteremia - Chest x-ray showing left-sided infiltrate.  Initially required Levophed  now this has been off. Respiratory panel 20 pathogen, COVID/flu/RSV negative. Echocardiogram shows EF of 55%, dilated aortic root but no obvious vegetation.  Initial cultures grew E  faecalis bacteremia, repeat currently remains unremarkable - After having prolonged discussion with the family members it was decided that we will treat empirically with p.o. antibiotics amoxicillin   1g 3 times daily for 6 weeks.  Family does understand this is not standard of care as they do not want TEE or IV antibiotics outpatient --Vitals stable, awaiting placement.    COPD -Not an active exacerbation.  Bronchodilators   Dysphagia: Risk for aspiration. On dysphagia 1 diet   Acute kidney injury: Resolved.  -Baseline creatinine 0.9, admission creatinine 3.91.   -Resolved.  -Treated with IV fluids.    HIV Continue with Triumeq .    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  Improving   Goals of care discussion done by PCCM team.  Patient remains DNR/DNI with comfort measures.  Only excepts antibiotics and IV fluids if indicated otherwise no feeding tube.  Palliative care team consulted by PCCM team   Failed speech and swallow eval. severe aspirations noted on MBS.  Recommended against any advance feeding tube, family agrees.        Consultants:  CCM Procedures performed: ECHO Disposition: Long term care facility Diet recommendation:  Dysphagia type 1 pudin thick  DISCHARGE MEDICATION: Allergies as of 10/25/2024   No Known Allergies      Medication List     STOP taking these medications  dextromethorphan -guaiFENesin  30-600 MG 12hr tablet Commonly known as: MUCINEX  DM   losartan  25 MG tablet Commonly known as: COZAAR    meloxicam  7.5 MG tablet Commonly known as: Mobic        TAKE these medications    alendronate  70 MG tablet Commonly known as: FOSAMAX  Take 70 mg  by mouth once a week. Take on Saturday.   amoxicillin  500 MG capsule Commonly known as: AMOXIL  Take 2 capsules (1,000 mg total) by mouth 3 (three) times daily.   arformoterol  15 MCG/2ML Nebu Commonly known as: BROVANA  Take 2 mLs (15 mcg total) by nebulization 2 (two) times daily.   budesonide  0.25 MG/2ML nebulizer solution Commonly known as: PULMICORT  Take 2 mLs (0.25 mg total) by nebulization 2 (two) times daily.   clopidogrel  75 MG tablet Commonly known as: PLAVIX  Take 75 mg by mouth daily.   dutasteride  0.5 MG capsule Commonly known as: AVODART  Take 1 capsule (0.5 mg total) by mouth daily.   ezetimibe  10 MG tablet Commonly known as: ZETIA  Take 10 mg by mouth daily.   ipratropium-albuterol  0.5-2.5 (3) MG/3ML Soln Commonly known as: DUONEB Take 3 mLs by nebulization every 4 (four) hours as needed.   lidocaine  4 % Place 1 patch onto the skin daily.   loratadine  10 MG tablet Commonly known as: CLARITIN  Take 10 mg by mouth daily.   metoprolol  succinate 25 MG 24 hr tablet Commonly known as: TOPROL -XL Take 1 tablet (25 mg total) by mouth daily.   polyethylene glycol 17 g packet Commonly known as: MIRALAX  / GLYCOLAX  Take 17 g by mouth daily as needed.   revefenacin  175 MCG/3ML nebulizer solution Commonly known as: YUPELRI  Take 3 mLs (175 mcg total) by nebulization daily. Start taking on: October 26, 2024   simethicone  80 MG chewable tablet Commonly known as: MYLICON Chew 1 tablet (80 mg total) by mouth every 6 (six) hours as needed for flatulence.   tamsulosin  0.4 MG Caps capsule Commonly known as: FLOMAX  Take 1 capsule (0.4 mg total) by mouth daily.   Triumeq  600-50-300 MG tablet Generic drug: abacavir -dolutegravir -lamiVUDine  Tome 1 tableta por va oral diariamente. (Take 1 tablet by mouth daily.)   Ventolin  HFA 108 (90 Base) MCG/ACT inhaler Generic drug: albuterol  Inhale 2 puffs into the lungs 4 (four) times daily as needed. For shortness of breath  and/or wheezing.        Contact information for follow-up providers     [name], MD Follow up.   Specialty: Family Medicine Contact information: [address] [place] [address] [phone]              Contact information for after-discharge care     Destination     [facility] .   Service: Skilled Nursing Contact information: [address] [place]  [phone]                    Discharge Exam: Fredricka Weights   10/23/24 0438 10/24/24 0500 10/25/24 0448  Weight: 59.8 kg 62 kg 61.3 kg  General; NAD  Condition at discharge: stable  The results of significant diagnostics from this hospitalization (including imaging, microbiology, ancillary and laboratory) are listed below for reference.   Imaging Studies: DG Swallowing Func-Speech Pathology Result Date: 10/19/2024 Table formatting from the original result was not included. Modified Barium Swallow Study Patient Details Name: Brian [name] MRN: [id number] Date of Birth: 1941/03/01 Today's Date: 10/19/2024 HPI/PMH: HPI: Jailan Trimm is an 83 y.o. male  who presented to the hospital from SNF on 10/18/24 with productive cough and SOB. He was initially hypoxic in ED but this improved to 99% on RA. CXR showed left lower lobe and left middle area infiltrates. He was made NPO awaiting SLP evaluation. and admitted with sepsis from PNA, hypotension, AKI. PMH: COPD, HIV, CVA with right upper extremity weakness, dysphagia (2023 MBS reported trace silent aspiration of nectar thick and thin liquids which was eliminated when patient taking small sips.) Clinical Impression: Clinical Impression: Kendell Sagraves presents with a severe oropharyngeal dysphagia in addition to a suspected esophageal dysphagia. (DIGEST score: 3-severe) and compared to images from 2023 MBS, it does appear that his swallow function has declined. Silent aspiration (PAS 8) occured with  thin and nectar thick liquids and sensed aspiration only occured with gross aspiration of thin liquids. Laryngeal vestibule closure was incomplete and mistimed, leading to aspiration prior to and during swallow. Smaller sips as well as chin tuck posture did reduce but not eliminate the amount and frequency of aspiration; small sips of nectar thick liquids prevented aspiration and In addition, at end of study, patient indicated that he felt sick and started dry heaving. He proceeded to vomit clear-yellow liquid and barium contrast into emesis bag. The combination of his severe dysphagia, acute PNA, SNF report of overall deterioration in function all place him at a high risk for developing bacterial aspiration pneumonia. In order to maximize his airway protection, SLP recommends PO diet pudding thick liquids/puree solids. Factors that may increase risk of adverse event in presence of aspiration Noe & Lianne 2021): Factors that may increase risk of adverse event in presence of aspiration Noe & Lianne 2021): Poor general health and/or compromised immunity; Frail or deconditioned; Limited mobility; Inadequate oral hygiene; Aspiration of thick, dense, and/or acidic materials DIGEST Swallow Severity Rating*  Safety: 3  Efficiency:1  Overall Pharyngeal Swallow Severity: 3 1: mild; 2: moderate; 3: severe; 4: profound *The Dynamic Imaging Grade of Swallowing Toxicity is standardized for the head and neck cancer population, however, demonstrates promising clinical applications across populations to standardize the clinical rating of pharyngeal swallow safety and severity. Recommendations/Plan: Swallowing Evaluation Recommendations Swallowing Evaluation Recommendations Recommendations: PO diet PO Diet Recommendation: Dysphagia 1 (Pureed); Extremely thick liquids (Level 4, pudding thick) Liquid Administration via: Spoon Medication Administration: Crushed with puree Supervision: Full assist for feeding Swallowing  strategies  : Slow rate; Small bites/sips Postural changes: Position pt fully upright for meals; Stay upright 30-60 min after meals Oral care recommendations: Oral care BID (2x/day) Treatment Plan Treatment Plan Treatment recommendations: Therapy as outlined in treatment plan below Follow-up recommendations: Follow physicians's recommendations for discharge plan and follow up therapies Functional status assessment: Patient has had a recent decline in their functional status and demonstrates the ability to make significant improvements in function in a reasonable and predictable amount of time. Treatment frequency: Min 2x/week Treatment duration: 1 week Interventions: Patient/family education; Trials of upgraded texture/liquids; Diet toleration management by SLP Recommendations Recommendations for follow up therapy are one component of a multi-disciplinary discharge planning process, led by the attending physician.  Recommendations may be updated based on patient status, additional functional criteria and insurance authorization. Assessment: Orofacial Exam: Orofacial Exam Oral Cavity: Oral Hygiene: Dried secretions; Xerostomia Oral Cavity - Dentition: Poor condition Orofacial Anatomy: WFL Anatomy: No data recorded Boluses Administered: Boluses Administered Boluses Administered: Thin liquids (Level 0); Mildly thick liquids (Level 2, nectar thick); Moderately thick liquids (Level 3, honey thick); Puree; Solid  Oral Impairment Domain: Oral  Impairment Domain Lip Closure: No labial escape Tongue control during bolus hold: Posterior escape of less than half of bolus Bolus preparation/mastication: Slow prolonged chewing/mashing with complete recollection Bolus transport/lingual motion: Brisk tongue motion Oral residue: Residue collection on oral structures Location of oral residue : Tongue Initiation of pharyngeal swallow : Pyriform sinuses  Pharyngeal Impairment Domain: Pharyngeal Impairment Domain Soft palate elevation:  No bolus between soft palate (SP)/pharyngeal wall (PW) Laryngeal elevation: Complete superior movement of thyroid cartilage with complete approximation of arytenoids to epiglottic petiole Anterior hyoid excursion: Complete anterior movement Epiglottic movement: Complete inversion Laryngeal vestibule closure: Incomplete, narrow column air/contrast in laryngeal vestibule Pharyngeal stripping wave : Present - diminished Pharyngeal contraction (A/P view only): N/A Pharyngoesophageal segment opening: Complete distension and complete duration, no obstruction of flow Tongue base retraction: No contrast between tongue base and posterior pharyngeal wall (PPW) Pharyngeal residue: Collection of residue within or on pharyngeal structures Location of pharyngeal residue: Valleculae; Pyriform sinuses; Aryepiglottic folds  Esophageal Impairment Domain: Esophageal Impairment Domain Esophageal clearance upright position: Esophageal retention Pill: Pill Consistency administered: Thin liquids (Level 0) Thin liquids (Level 0): [facility] Penetration/Aspiration Scale Score: Penetration/Aspiration Scale Score 1.  Material does not enter airway: Puree; Solid; Pill 2.  Material enters airway, remains ABOVE vocal cords then ejected out: Moderately thick liquids (Level 3, honey thick) 5.  Material enters airway, CONTACTS cords and not ejected out: Thin liquids (Level 0); Mildly thick liquids (Level 2, nectar thick) 7.  Material enters airway, passes BELOW cords and not ejected out despite cough attempt by patient: Thin liquids (Level 0) 8.  Material enters airway, passes BELOW cords without attempt by patient to eject out (silent aspiration) : Thin liquids (Level 0); Mildly thick liquids (Level 2, nectar thick) Compensatory Strategies: Compensatory Strategies Compensatory strategies: Yes Chin tuck: Effective Effective Chin Tuck: Thin liquid (Level 0)   General Information: No data recorded Diet Prior to this Study: NPO   No data recorded  No data  recorded  No data recorded  No data recorded Behavior/Cognition: Alert; Cooperative; Pleasant mood No data recorded No data recorded No data recorded No data recorded Exam Limitations: No limitations Goal Planning: Prognosis for improved oropharyngeal function: Guarded Barriers to Reach Goals: Time post onset No data recorded Patient/Family Stated Goal: no family present, no stated goals per patient Consulted and agree with results and recommendations: Pt unable/family or caregiver not available Pain: Pain Assessment Pain Assessment: Faces Faces Pain Scale: 4 Breathing: 0 Negative Vocalization: 0 Facial Expression: 0 Body Language: 0 Consolability: 0 PAINAD Score: 0 Facial Expression: 0 Body Movements: 0 Muscle Tension: 0 Compliance with ventilator (intubated pts.): N/A Vocalization (extubated pts.): 0 CPOT Total: 0 Pain Location: throat after a few swallows of barium Pain Descriptors / Indicators: Discomfort; Grimacing; Sore Pain Intervention(s): Monitored during session End of Session: Start Time:SLP Start Time (ACUTE ONLY): 1125 Stop Time: SLP Stop Time (ACUTE ONLY): 1148 Time Calculation:SLP Time Calculation (min) (ACUTE ONLY): 23 min Charges: SLP Evaluations $ SLP Speech Visit: 1 Visit SLP Evaluations $BSS Swallow: 1 Procedure $MBS Swallow: 1 Procedure SLP visit diagnosis: SLP Visit Diagnosis: Dysphagia, oropharyngeal phase (R13.12) Past Medical History: Past Medical History: Diagnosis Date  Arthritis   Asthma   Bradycardia   Depression   Enlarged prostate   HIV (human immunodeficiency virus infection) (HCC)   Hyperlipidemia   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  Stroke [facility])  Past Surgical History: Past Surgical History: Procedure Laterality Date  cataracts    EYE SURGERY    PACEMAKER INSERTION N/A  08/02/2015  Procedure: INSERTION PACEMAKER;  Surgeon: Sheralyn Flock, MD;  Location: [name];  Service: Cardiovascular;  Laterality: N/A; Norleen IVAR Blase, MA, CCC-SLP Speech Therapy 10/19/2024, 3:42 PM  ECHOCARDIOGRAM  COMPLETE Result Date: 10/19/2024    ECHOCARDIOGRAM REPORT   Patient Name:   Brian [name] Date of Exam: 10/19/2024 Medical Rec #:  [id number]    Height:       67.0 in Accession #:    [id number]   Weight:       111.8 lb Date of Birth:  [birth year]     BSA:          1.579 m Patient Age:    83 years     BP:           145/73 mmHg Patient Gender: M            HR:           57 bpm. Exam Location:  Inpatient Procedure: 2D Echo, Cardiac Doppler and Color Doppler (Both Spectral and Color            Flow Doppler were utilized during procedure). Indications:    Bacteremia R78.81  History:        Patient has prior history of Echocardiogram examinations, most                 recent 04/25/2022. Previous Myocardial Infarction, Stroke; Risk                 Factors:Dyslipidemia.  Sonographer:    Tinnie Gosling RDCS Referring Phys: [id number] BURGESS BROCKS AMIN IMPRESSIONS  1. Left ventricular ejection fraction, by estimation, is 50 to 55%. The left ventricle has low normal function. The left ventricle has no regional wall motion abnormalities. There is mild concentric left ventricular hypertrophy. Left ventricular diastolic parameters are consistent with Grade II diastolic dysfunction (pseudonormalization).  2. Right ventricular systolic function is normal. The right ventricular size is normal.  3. There is no evidence of cardiac tamponade.  4. The mitral valve is normal in structure. Trivial mitral valve regurgitation. No evidence of mitral stenosis.  5. The aortic valve is normal in structure. There is mild calcification of the aortic valve. Aortic valve regurgitation is mild. No aortic stenosis is present.  6. Aortic dilatation noted. 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  7. The inferior vena cava is normal in size with greater than 50% respiratory variability, suggesting right atrial pressure of 3 mmHg. Conclusion(s)/Recommendation(s): No obvious  vegetations seen on chest wall echo. The aortic valve is mildly calcified but I do not see a frank vegetation. If clinical suspicion for endocarditis is high, would recommned TEE. FINDINGS  Left Ventricle: Left ventricular ejection fraction, by estimation, is 50 to 55%. The left ventricle has low normal function. The left ventricle has no regional wall motion abnormalities. The left ventricular internal cavity size was normal in size. There is mild concentric left ventricular hypertrophy. Left ventricular diastolic parameters are consistent with Grade II diastolic dysfunction (pseudonormalization). Right Ventricle: The right ventricular size is normal. No increase in right ventricular wall thickness. Right ventricular systolic function is normal. Left Atrium: Left atrial size was normal in size. Right Atrium: Right atrial size was normal in size. Pericardium: Trivial pericardial effusion is present. The pericardial effusion is anterior to the right ventricle. There is no evidence of cardiac tamponade. Mitral Valve: The mitral valve is normal in structure. Trivial  mitral valve regurgitation. No evidence of mitral valve stenosis. Tricuspid Valve: The tricuspid valve is normal in structure. Tricuspid valve regurgitation is mild . No evidence of tricuspid stenosis. Aortic Valve: The aortic valve is normal in structure. There is mild calcification of the aortic valve. Aortic valve regurgitation is mild. No aortic stenosis is present. Pulmonic Valve: The pulmonic valve was normal in structure. Pulmonic valve regurgitation is trivial. No evidence of pulmonic stenosis. Aorta: Aortic dilatation noted. 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 Venous: The inferior vena cava is normal in size with greater than 50% respiratory variability, suggesting right atrial pressure of 3 mmHg. IAS/Shunts: No atrial level shunt detected by color flow  Doppler.  LEFT VENTRICLE PLAX 2D LVIDd:         5.00 cm     Diastology LVIDs:         3.50 cm     LV e' medial:    4.03 cm/s LV PW:         1.20 cm     LV E/e' medial:  16.5 LV IVS:        1.20 cm     LV e' lateral:   3.81 cm/s LVOT diam:     2.00 cm     LV E/e' lateral: 17.4 LV SV:         53 LV SV Index:   34 LVOT Area:     3.14 cm  LV Volumes (MOD) LV vol d, MOD A2C: 60.9 ml LV vol d, MOD A4C: 77.7 ml LV vol s, MOD A2C: 29.6 ml LV vol s, MOD A4C: 33.5 ml LV SV MOD A2C:     31.3 ml LV SV MOD A4C:     77.7 ml LV SV MOD BP:      41.8 ml RIGHT VENTRICLE             IVC RV S prime:     10.90 cm/s  IVC diam: 1.90 cm TAPSE (M-mode): 2.0 cm LEFT ATRIUM           Index LA diam:      3.70 cm 2.34 cm/m LA Vol (A4C): 41.5 ml 26.28 ml/m  AORTIC VALVE LVOT Vmax:   67.90 cm/s LVOT Vmean:  47.800 cm/s LVOT VTI:    0.169 m  AORTA Ao Root diam: 4.00 cm Ao Asc diam:  4.00 cm MITRAL VALVE               TRICUSPID VALVE MV Area (PHT): 4.15 cm    TR Peak grad:   23.2 mmHg MV E velocity: 66.40 cm/s  TR Vmax:        241.00 cm/s MV A velocity: 69.80 cm/s MV E/A ratio:  0.95        SHUNTS                            Systemic VTI:  0.17 m                            Systemic Diam: 2.00 cm Toribio Fuel MD Electronically signed by Toribio Fuel MD Signature Date/Time: 10/19/2024/11:06:07 AM    Final    DG Chest Port 1 View if patient is in a treatment room. Result Date: 10/17/2024 EXAM: 1 VIEW(S) XRAY OF THE CHEST 10/17/2024 08:24:00 PM COMPARISON:  05/06/24. CLINICAL HISTORY: Suspected Sepsis Suspected Sepsis FINDINGS: LUNGS AND PLEURA: Retrocardiac airspace opacities. Infiltrates in the left midlung. No pleural effusion. No pneumothorax. HEART AND MEDIASTINUM: No acute abnormality of the cardiac and mediastinal silhouettes. BONES AND SOFT TISSUES: No acute osseous abnormality. Left chest wall pacemaker. IMPRESSION: 1. Left mid and lower lung pneumonia. Electronically signed by: Norman Gatlin MD 10/17/2024 08:31 PM EST RP  Workstation: [id number]    Microbiology: Results for orders placed or performed during the hospital encounter of 10/17/24  Resp panel by RT-PCR (RSV, Flu A&B, Covid) Anterior Nasal Swab     Status: None   Collection Time: 10/17/24  8:16 PM   Specimen: Anterior Nasal Swab  Result Value Ref Range Status   SARS Coronavirus 2 by RT PCR NEGATIVE NEGATIVE Final   Influenza A by PCR NEGATIVE NEGATIVE Final   Influenza B by PCR NEGATIVE NEGATIVE Final    Comment: (NOTE) The Xpert Xpress SARS-CoV-2/FLU/RSV plus assay is intended as an aid in the diagnosis of influenza from Nasopharyngeal swab specimens and should not be used as a sole basis for treatment. Nasal washings and aspirates are unacceptable for Xpert Xpress SARS-CoV-2/FLU/RSV testing.  Fact Sheet for Patients: bloggercourse.com  Fact Sheet for Healthcare Providers: seriousbroker.it  This test is not yet approved or cleared by the [place]  FDA and has been authorized for detection and/or diagnosis of SARS-CoV-2 by FDA under an Emergency Use Authorization (EUA). This EUA will remain in effect (meaning this test can be used) for the duration of the COVID-19 declaration under Section 564(b)(1) of the Act, 21 U.S.C. section 360bbb-3(b)(1), unless the authorization is terminated or revoked.     Resp Syncytial Virus by PCR NEGATIVE NEGATIVE Final    Comment: (NOTE) Fact Sheet for Patients: bloggercourse.com  Fact Sheet for Healthcare Providers: seriousbroker.it  This test is not yet approved or cleared by the [place]  FDA and has been authorized for detection and/or diagnosis of SARS-CoV-2 by FDA under an Emergency Use Authorization (EUA). This EUA will remain in effect (meaning this test can be used) for the duration of the COVID-19 declaration under Section 564(b)(1) of the Act, 21 U.S.C. section 360bbb-3(b)(1),  unless the authorization is terminated or revoked.  Performed at [facility], [address] [place], [place] [address]   Blood Culture (routine x 2)     Status: Abnormal   Collection Time: 10/17/24  8:16 PM   Specimen: BLOOD RIGHT FOREARM  Result Value Ref Range Status   Specimen Description BLOOD RIGHT FOREARM  Final   Special Requests   Final    BOTTLES DRAWN AEROBIC ONLY Blood Culture results may not be optimal due to an inadequate volume of blood received in culture bottles   Culture  Setup Time   Final    GRAM POSITIVE COCCI AEROBIC BOTTLE ONLY CRITICAL VALUE NOTED.  VALUE IS CONSISTENT WITH PREVIOUSLY REPORTED AND CALLED VALUE.    Culture (A)  Final    AEROCOCCUS URINAE Standardized susceptibility testing for this organism is not available. Performed at [facility], [address] [place], [place] [address]    Report Status 10/21/2024 FINAL  Final  Blood Culture (routine x 2)     Status: Abnormal   Collection Time: 10/17/24  8:21 PM   Specimen: BLOOD  Result Value Ref Range Status   Specimen Description BLOOD RIGHT ANTECUBITAL  Final   Special Requests   Final    BOTTLES DRAWN AEROBIC AND ANAEROBIC Blood Culture adequate volume  Culture  Setup Time   Final    GRAM POSITIVE COCCI IN BOTH AEROBIC AND ANAEROBIC BOTTLES CRITICAL RESULT CALLED TO, READ BACK BY AND VERIFIED WITH: [facility]  PHARMD 10/19/2024 @ 0335 BY DD    Culture (A)  Final    AEROCOCCUS URINAE ENTEROCOCCUS FAECALIS VANCOMYCIN  RESISTANT ENTEROCOCCUS ISOLATED CRITICAL RESULT CALLED TO, READ BACK BY AND VERIFIED WITH: MAYA Karleen MATSU [phone] fcp Performed at [facility], [address] [place], [place] [address]    Report Status 10/24/2024 FINAL  Final   Organism ID, Bacteria ENTEROCOCCUS FAECALIS  Final      Susceptibility   Enterococcus faecalis - MIC*    AMPICILLIN  <=2 SENSITIVE Sensitive     VANCOMYCIN  >=32 RESISTANT Resistant     GENTAMICIN  SYNERGY SENSITIVE Sensitive      * ENTEROCOCCUS FAECALIS  Blood Culture ID Panel (Reflexed)     Status: None   Collection Time: 10/17/24  8:21 PM  Result Value Ref Range Status   Enterococcus faecalis NOT DETECTED NOT DETECTED Final   Enterococcus Faecium NOT DETECTED NOT DETECTED Final   Listeria monocytogenes NOT DETECTED NOT DETECTED Final   Staphylococcus species NOT DETECTED NOT DETECTED Final   Staphylococcus aureus (BCID) NOT DETECTED NOT DETECTED Final   Staphylococcus epidermidis NOT DETECTED NOT DETECTED Final   Staphylococcus lugdunensis NOT DETECTED NOT DETECTED Final   Streptococcus species NOT DETECTED NOT DETECTED Final   Streptococcus agalactiae NOT DETECTED NOT DETECTED Final   Streptococcus pneumoniae NOT DETECTED NOT DETECTED Final   Streptococcus pyogenes NOT DETECTED NOT DETECTED Final   A.calcoaceticus-baumannii NOT DETECTED NOT DETECTED Final   Bacteroides fragilis NOT DETECTED NOT DETECTED Final   Enterobacterales NOT DETECTED NOT DETECTED Final   Enterobacter cloacae complex NOT DETECTED NOT DETECTED Final   Escherichia coli NOT DETECTED NOT DETECTED Final   Klebsiella aerogenes NOT DETECTED NOT DETECTED Final   Klebsiella oxytoca NOT DETECTED NOT DETECTED Final   Klebsiella pneumoniae NOT DETECTED NOT DETECTED Final   Proteus species NOT DETECTED NOT DETECTED Final   Salmonella species NOT DETECTED NOT DETECTED Final   Serratia marcescens NOT DETECTED NOT DETECTED Final   Haemophilus influenzae NOT DETECTED NOT DETECTED Final   Neisseria meningitidis NOT DETECTED NOT DETECTED Final   Pseudomonas aeruginosa NOT DETECTED NOT DETECTED Final   Stenotrophomonas maltophilia NOT DETECTED NOT DETECTED Final   Candida albicans NOT DETECTED NOT DETECTED Final   Candida auris NOT DETECTED NOT DETECTED Final   Candida glabrata NOT DETECTED NOT DETECTED Final   Candida krusei NOT DETECTED NOT DETECTED Final   Candida parapsilosis NOT DETECTED NOT DETECTED Final   Candida tropicalis NOT DETECTED  NOT DETECTED Final   Cryptococcus neoformans/gattii NOT DETECTED NOT DETECTED Final    Comment: Performed at [facility], [address] [place], [place] [address]  MRSA Next Gen by PCR, Nasal     Status: None   Collection Time: 10/18/24 12:44 AM   Specimen: Nasal Mucosa; Nasal Swab  Result Value Ref Range Status   MRSA by PCR Next Gen NOT DETECTED NOT DETECTED Final    Comment: (NOTE) The GeneXpert MRSA Assay (FDA approved for NASAL specimens only), is one component of a comprehensive MRSA colonization surveillance program. It is not intended to diagnose MRSA infection nor to guide or monitor treatment for MRSA infections. Test performance is not FDA approved in patients less than 50 years old. Performed at [facility], [address], [place], [place] [address]  Urine Culture     Status: Abnormal   Collection Time: 10/18/24 12:55 AM   Specimen: Urine, Random  Result Value Ref Range Status   Specimen Description URINE, RANDOM  Final   Special Requests   Final    NONE Reflexed from [id number] Performed at [facility], [address] [place], [place] [address]    Culture MULTIPLE SPECIES PRESENT, SUGGEST RECOLLECTION (A)  Final   Report Status 10/19/2024 FINAL  Final  Respiratory (~20 pathogens) panel by PCR     Status: None   Collection Time: 10/18/24  9:59 AM   Specimen: Nasopharyngeal Swab; Respiratory  Result Value Ref Range Status   Adenovirus NOT DETECTED NOT DETECTED Final   Coronavirus 229E NOT DETECTED NOT DETECTED Final    Comment: (NOTE) The Coronavirus on the Respiratory Panel, DOES NOT test for the novel  Coronavirus (2019 nCoV)    Coronavirus HKU1 NOT DETECTED NOT DETECTED Final   Coronavirus NL63 NOT DETECTED NOT DETECTED Final   Coronavirus OC43 NOT DETECTED NOT DETECTED Final   Metapneumovirus NOT DETECTED NOT DETECTED Final   Rhinovirus / Enterovirus NOT DETECTED NOT DETECTED Final   Influenza A NOT DETECTED NOT DETECTED Final    Influenza B NOT DETECTED NOT DETECTED Final   Parainfluenza Virus 1 NOT DETECTED NOT DETECTED Final   Parainfluenza Virus 2 NOT DETECTED NOT DETECTED Final   Parainfluenza Virus 3 NOT DETECTED NOT DETECTED Final   Parainfluenza Virus 4 NOT DETECTED NOT DETECTED Final   Respiratory Syncytial Virus NOT DETECTED NOT DETECTED Final   Bordetella pertussis NOT DETECTED NOT DETECTED Final   Bordetella Parapertussis NOT DETECTED NOT DETECTED Final   Chlamydophila pneumoniae NOT DETECTED NOT DETECTED Final   Mycoplasma pneumoniae NOT DETECTED NOT DETECTED Final    Comment: Performed at [facility], [address] [place], [place] [address]  Culture, blood (Routine X 2) w Reflex to ID Panel     Status: None   Collection Time: 10/19/24  8:44 AM   Specimen: BLOOD LEFT HAND  Result Value Ref Range Status   Specimen Description   Final    BLOOD LEFT HAND Performed at [facility], [address] [place], [place] [address]    Special Requests   Final    BOTTLES DRAWN AEROBIC AND ANAEROBIC Blood Culture results may not be optimal due to an inadequate volume of blood received in culture bottles Performed at [facility], [address] [place], [place] [address]    Culture   Final    NO GROWTH 5 DAYS Performed at [facility], [address], [place], [place] [address]    Report Status 10/24/2024 FINAL  Final  Culture, blood (Routine X 2) w Reflex to ID Panel     Status: None   Collection Time: 10/19/24  8:46 AM   Specimen: BLOOD LEFT HAND  Result Value Ref Range Status   Specimen Description   Final    BLOOD LEFT HAND Performed at [facility], [address] [place], [place] [address]    Special Requests   Final    BOTTLES DRAWN AEROBIC AND ANAEROBIC Blood Culture results may not be optimal due to an inadequate volume of blood received in culture bottles Performed at [facility], [address], [place], [place] [address]     Culture   Final    NO GROWTH 5 DAYS Performed at [facility], [address] [place], [place] [address]    Report Status 10/24/2024 FINAL  Final    Labs: CBC: Recent Labs  Lab 10/21/24 0524  WBC 7.4  HGB 12.6*  HCT 35.4*  MCV 91.9  PLT 101*   Basic Metabolic Panel: Recent Labs  Lab 10/19/24 0844 10/20/24 0258 10/24/24 0550 10/25/24 0512  NA 141 139  --  142  K 4.1 3.6  --  3.1*  CL 111 108  --  105  CO2 22 23  --  25  GLUCOSE 78 87  --  84  BUN 21 12  --  8  CREATININE 0.84 0.76 0.70 0.69  CALCIUM  8.3* 7.7*  --  8.5*  MG 2.4 2.3  --   --   PHOS 2.2*  --   --  3.2   Liver Function Tests: No results for input(s): AST, ALT, ALKPHOS, BILITOT, PROT, ALBUMIN in the last 168 hours. CBG: Recent Labs  Lab 10/24/24 1118 10/24/24 1635 10/24/24 2010 10/24/24 2357 10/25/24 0404  GLUCAP 113* 111* 99 88 125*    Discharge time spent: greater than 30 minutes.  Signed: Owen DELENA Lore, MD [facility] 10/25/2024 "

## 2024-10-25 NOTE — Evaluation (Signed)
 Occupational Therapy Evaluation Patient Details Name: Brian [name] MRN: [id number] DOB: August 15, 1941 Today's Date: 10/25/2024   History of Present Illness   Brian [name] is a 83 y.o. male with medical history significant for HTN, HIV on HAART, prior CVA with right upper extremity weakness, COPD admitted with severe sepsis 2* acquired pna vs aspiration pna.     Clinical Impressions Pt was mod I in self care prior to his admission in November and receiving assistance for IADLs by a personal care attendant. Pt not able to articulate his functional level in SNF just prior to this admission with assistance of video interpreter. Pt minimally verbalizing this session, but answering yes/no questions reliably and following commands with multimodal cues. Pt requires assistance for hips to EOB using bed pad and moderate assistance for step pivot transfer from bed to chair. He needs set up to total assist for ADLs. Pt self feeding lunch at end of session in chair with all needs met. Patient will benefit from continued inpatient follow up therapy, <3 hours/day.     If plan is discharge home, recommend the following:   A lot of help with walking and/or transfers;A lot of help with bathing/dressing/bathroom;Assistance with cooking/housework;Assist for transportation     Functional Status Assessment   Patient has had a recent decline in their functional status and/or demonstrates limited ability to make significant improvements in function in a reasonable and predictable amount of time     Equipment Recommendations   Other (comment) (defer)     Recommendations for Other Services         Precautions/Restrictions   Precautions Precautions: Fall Restrictions Weight Bearing Restrictions Per Provider Order: No     Mobility Bed Mobility Overal bed mobility: Needs Assistance Bed Mobility: Supine to Sit     Supine to sit: Mod assist     General bed mobility comments: assist for hips to  EOB with bed pad    Transfers Overall transfer level: Needs assistance Equipment used: 1 person hand held assist Transfers: Sit to/from Stand, Bed to chair/wheelchair/BSC Sit to Stand: Mod assist     Step pivot transfers: Mod assist     General transfer comment: assist to rise and steady toward R side      Balance Overall balance assessment: Needs assistance   Sitting balance-Leahy Scale: Fair     Standing balance support: Single extremity supported Standing balance-Leahy Scale: Poor                             ADL either performed or assessed with clinical judgement   ADL Overall ADL's : Needs assistance/impaired Eating/Feeding: Set up;Sitting Eating/Feeding Details (indicate cue type and reason): self fed pureed foods with assist to open containers Grooming: Wash/dry hands;Sitting;Set up Grooming Details (indicate cue type and reason): set up with washcloths prior to eating Upper Body Bathing: Moderate assistance;Sitting   Lower Body Bathing: Maximal assistance;Sit to/from stand   Upper Body Dressing : Minimal assistance;Sitting   Lower Body Dressing: Maximal assistance;Sit to/from stand   Toilet Transfer: Moderate assistance;Stand-pivot Toilet Transfer Details (indicate cue type and reason): bed to recliner Toileting- Clothing Manipulation and Hygiene: Total assistance;Sit to/from stand               Vision Ability to See in Adequate Light: 0 Adequate Patient Visual Report: No change from baseline       Perception         Praxis  Pertinent Vitals/Pain Pain Assessment Pain Assessment: Faces Faces Pain Scale: No hurt     Extremity/Trunk Assessment Upper Extremity Assessment Upper Extremity Assessment: Right hand dominant   Lower Extremity Assessment Lower Extremity Assessment: Defer to PT evaluation   Cervical / Trunk Assessment Cervical / Trunk Assessment: Normal   Communication Communication Communication:  Impaired Factors Affecting Communication: Non - English speaking, interpreter not available   Cognition Arousal: Alert Behavior During Therapy: WFL for tasks assessed/performed Cognition: Difficult to assess Difficult to assess due to: Non-English speaking (used video interpreter)                             Following commands: Impaired Following commands impaired: Follows one step commands with increased time     Cueing  General Comments   Cueing Techniques: Verbal cues;Gestural cues;Tactile cues;Visual cues      Exercises     Shoulder Instructions      Home Living Family/patient expects to be discharged to:: Skilled nursing facility                                        Prior Functioning/Environment Prior Level of Function : Independent/Modified Independent;History of Falls (last six months)             Mobility Comments: mod I prior to hospitalization in November ADLs Comments: was mod I in self care with a P[employer] worker 6 hours a day to help with IADLs prior to hospital admission in November    OT Problem List: Decreased strength;Impaired balance (sitting and/or standing);Decreased cognition;Decreased safety awareness;Decreased activity tolerance;Decreased coordination;Decreased knowledge of use of DME or AE   OT Treatment/Interventions: Self-care/ADL training;DME and/or AE instruction;Therapeutic activities;Patient/family education;Balance training      OT Goals(Current goals can be found in the care plan section)   Acute Rehab OT Goals OT Goal Formulation: With patient Time For Goal Achievement: 11/08/24 Potential to Achieve Goals: Fair ADL Goals Pt Will Perform Grooming: standing;with min assist Pt Will Transfer to Toilet: with min assist;ambulating;bedside commode Pt Will Perform Toileting - Clothing Manipulation and hygiene: with min assist;sit to/from stand Additional ADL Goal #1: Pt will complete bed mobility modified  independently in preparation for ADLs.   OT Frequency:  Min 2X/week    Co-evaluation              AM-PAC OT 6 Clicks Daily Activity     Outcome Measure Help from another person eating meals?: None Help from another person taking care of personal grooming?: A Little Help from another person toileting, which includes using toliet, bedpan, or urinal?: Total Help from another person bathing (including washing, rinsing, drying)?: A Lot Help from another person to put on and taking off regular upper body clothing?: A Little Help from another person to put on and taking off regular lower body clothing?: Total 6 Click Score: 14   End of Session Equipment Utilized During Treatment: Gait belt Nurse Communication: Mobility status  Activity Tolerance: Patient tolerated treatment well Patient left: in chair;with call bell/phone within reach;with chair alarm set  OT Visit Diagnosis: Unsteadiness on feet (R26.81);Repeated falls (R29.6);Muscle weakness (generalized) (M62.81);History of falling (Z91.81)                Time: 8641-8584 OT Time Calculation (min): 17 min Charges:  OT General Charges $OT Visit: 1 Visit OT Evaluation $OT Eval Moderate  Complexity: 1 Mod  Mliss HERO, OTR/L Acute Rehabilitation Services Office: [phone]   Kennth Mliss Helling 10/25/2024, 3:04 PM

## 2024-10-25 NOTE — Care Management Important Message (Signed)
 Important Message  Patient Details IM Letter given. Name: Brian [name] MRN: [id number] Date of Birth: 1941/09/15   Important Message Given:  Yes - Medicare IM     Melba Ates 10/25/2024, 2:51 PM

## 2024-10-25 NOTE — TOC Progression Note (Signed)
 Transition of Care [facility]) - Progression Note    Patient Details  Name: Kean Gautreau MRN: [id number] Date of Birth: 12/18/1940  [facility]) CM/SW Contact  Yarnell Arvidson, Nathanel, RN Phone Number: 10/25/2024, 12:12 PM  Clinical Narrative: janas for [address]-patient for return ST SNF rep Darrian following. Reeda barrows pi#[id number] auth prior d/c.      Expected Discharge Plan: Skilled Nursing Facility Barriers to Discharge: Insurance Authorization               Expected Discharge Plan and Services In-house Referral: NA Discharge Planning Services: CM Consult Post Acute Care Choice: Skilled Nursing Facility Living arrangements for the past 2 months: Single Family Home Expected Discharge Date: 10/25/24               DME Arranged: N/A DME Agency: NA       HH Arranged: NA HH Agency: NA         Social Drivers of Health (SDOH) Interventions SDOH Screenings   Food Insecurity: Patient Unable To Answer (11/29/2023)  Housing: Patient Unable To Answer (11/29/2023)  Transportation Needs: Patient Unable To Answer (10/18/2024)  Utilities: Patient Unable To Answer (11/29/2023)  Depression (PHQ2-9): Low Risk (06/25/2022)  Social Connections: Patient Unable To Answer (11/29/2023)  Tobacco Use: Medium Risk (10/17/2024)    Readmission Risk Interventions    10/19/2024   10:47 AM  Readmission Risk Prevention Plan  Transportation Screening Complete  PCP or Specialist Appt within 5-7 Days Complete  Home Care Screening Complete  Medication Review (RN CM) Complete

## 2024-10-26 DIAGNOSIS — A419 Sepsis, unspecified organism: Secondary | ICD-10-CM | POA: Diagnosis not present

## 2024-10-26 DIAGNOSIS — R6520 Severe sepsis without septic shock: Secondary | ICD-10-CM | POA: Diagnosis not present

## 2024-10-26 LAB — GLUCOSE, CAPILLARY
Glucose-Capillary: 88 mg/dL (ref 70–99)
Glucose-Capillary: 105 mg/dL — ABNORMAL HIGH (ref 70–99)
Glucose-Capillary: 91 mg/dL (ref 70–99)
Glucose-Capillary: 155 mg/dL — ABNORMAL HIGH (ref 70–99)

## 2024-10-26 MED ORDER — FLEET ENEMA RE ENEM
1.0000 | ENEMA | Freq: Once | RECTAL | Status: AC
  Administered 2024-10-26: 1 via RECTAL
  Filled 2024-10-26: qty 1

## 2024-10-26 NOTE — Discharge Summary (Signed)
 " Physician Discharge Summary   Patient: Brian [name] MRN: [id number] DOB: 1941-01-22  Admit date:     10/17/2024  Discharge date: 10/26/2024  Discharge Physician: Owen DELENA Lore   PCP: [name], MD   Recommendations at discharge:    Needs 6 weeks of oral antibiotics.  Palliative care follow up   Discharge Diagnoses: Principal Problem:   Severe sepsis (HCC) Active Problems:   Urinary retention   Pneumonia of left lung due to infectious organism   Pressure injury of skin  Resolved Problems:   * No resolved hospital problems. [place] [facility] Course: 83 year old Spanish-speaking male from SNF [facility] DNR/DNI with past medical history of HTN, HIV on [place], CVA with right upper extremity weakness, COPD, chronic dysphagia presented with shortness of breath and cough.  Patient found to have sepsis secondary to pneumonia started on broad-spectrum antibiotics.  Patient was admitted to the ICU initially as he required pressors. Initially required Levophed  now this has been off. Respiratory panel 20 pathogen, COVID/flu/RSV negative. Echocardiogram shows EF of 55%, dilated aortic root but no obvious vegetation.  Initial cultures grew E faecalis bacteremia, repeat currently remains unremarkable. After having prolonged discussion with the family members it was decided that we will treat empirically with p.o. antibiotics amoxicillin  1 g 3 times daily for 6 weeks.  Family does understand this is not standard of care as they do not want TEE or IV antibiotics outpatient.    TOC consulted, as long as we have safe disposition we can discharge him.   Sabel for discharge.   Assessment and Plan: Septic shock secondary to aspiration versus healthcare acquired pneumonia E faecalis bacteremia - Chest x-ray showing left-sided infiltrate.  Initially required Levophed  now this has been off. Respiratory panel 20 pathogen, COVID/flu/RSV negative. Echocardiogram shows EF of 55%, dilated aortic root but  no obvious vegetation.  Initial cultures grew E faecalis bacteremia, repeat currently remains unremarkable - After having prolonged discussion with the family members it was decided that we will treat empirically with p.o. antibiotics amoxicillin   1g 3 times daily for 6 weeks.  Family does understand this is not standard of care as they do not want TEE or IV antibiotics outpatient --Vitals stable, awaiting placement.    COPD -Not an active exacerbation.  Bronchodilators   Dysphagia: Risk for aspiration. On dysphagia 1 diet   Acute kidney injury: Resolved.  -Baseline creatinine 0.9, admission creatinine 3.91.   -Resolved.  -Treated with IV fluids.    HIV Continue with Triumeq .    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  Improving   Goals of care discussion done by PCCM team.  Patient remains DNR/DNI with comfort measures.  Only excepts antibiotics and IV fluids if indicated otherwise no feeding tube.  Palliative care team consulted by PCCM team   Failed speech and swallow eval. severe aspirations noted on MBS.  Recommended against any advance feeding tube, family agrees.        Consultants:  CCM Procedures performed: ECHO Disposition: Long term care facility Diet recommendation:  Dysphagia type 1 pudin thick  DISCHARGE MEDICATION: Allergies as of 10/26/2024   No Known Allergies      Medication List  STOP taking these medications    dextromethorphan -guaiFENesin  30-600 MG 12hr tablet Commonly known as: MUCINEX  DM   losartan  25 MG tablet Commonly known as: COZAAR    meloxicam  7.5 MG tablet Commonly known as: Mobic        TAKE these medications    alendronate  70  MG tablet Commonly known as: FOSAMAX  Take 70 mg by mouth once a week. Take on Saturday.   amoxicillin  500 MG capsule Commonly known as: AMOXIL  Take 2 capsules (1,000 mg total) by mouth 3 (three) times daily.   arformoterol  15 MCG/2ML Nebu Commonly known as: BROVANA  Take 2 mLs (15 mcg total) by nebulization 2 (two) times daily.   budesonide  0.25 MG/2ML nebulizer solution Commonly known as: PULMICORT  Take 2 mLs (0.25 mg total) by nebulization 2 (two) times daily.   clopidogrel  75 MG tablet Commonly known as: PLAVIX  Take 75 mg by mouth daily.   dutasteride  0.5 MG capsule Commonly known as: AVODART  Take 1 capsule (0.5 mg total) by mouth daily.   ezetimibe  10 MG tablet Commonly known as: ZETIA  Take 10 mg by mouth daily.   ipratropium-albuterol  0.5-2.5 (3) MG/3ML Soln Commonly known as: DUONEB Take 3 mLs by nebulization every 4 (four) hours as needed.   lidocaine  4 % Place 1 patch onto the skin daily.   loratadine  10 MG tablet Commonly known as: CLARITIN  Take 10 mg by mouth daily.   metoprolol  succinate 25 MG 24 hr tablet Commonly known as: TOPROL -XL Take 1 tablet (25 mg total) by mouth daily.   polyethylene glycol 17 g packet Commonly known as: MIRALAX  / GLYCOLAX  Take 17 g by mouth daily as needed.   revefenacin  175 MCG/3ML nebulizer solution Commonly known as: YUPELRI  Take 3 mLs (175 mcg total) by nebulization daily.   simethicone  80 MG chewable tablet Commonly known as: MYLICON Chew 1 tablet (80 mg total) by mouth every 6 (six) hours as needed for flatulence.   tamsulosin  0.4 MG Caps capsule Commonly known as: FLOMAX  Take 1 capsule (0.4 mg total) by mouth daily.   Triumeq  600-50-300 MG tablet Generic drug: abacavir -dolutegravir -lamiVUDine  Tome 1 tableta por va oral diariamente. (Take 1 tablet by mouth daily.)   Ventolin  HFA 108 (90 Base) MCG/ACT inhaler Generic drug: albuterol  Inhale 2 puffs into the lungs 4 (four) times daily as needed. For shortness  of breath and/or wheezing.        Contact information for follow-up providers     [name], MD Follow up.   Specialty: Family Medicine Contact information: [address] [place] [address] [phone]              Contact information for after-discharge care     Destination     [facility] .   Service: Skilled Nursing Contact information: [address] [place]  [address] [phone]                    Discharge Exam: Fredricka Weights   10/24/24 0500 10/25/24 0448 10/26/24 0412  Weight: 62 kg 61.3 kg 59 kg  General; NAD  Condition at discharge: stable  The results of significant diagnostics from this hospitalization (including imaging, microbiology, ancillary and laboratory) are listed below for reference.   Imaging Studies: DG Swallowing Func-Speech Pathology Result Date: 10/19/2024 Table formatting from the original result was not included. Modified Barium Swallow Study Patient Details Name: Laderius Valbuena MRN: [id number] Date of Birth: July 30, 1941 Today's Date: 10/19/2024 HPI/PMH: HPI: Ferd Horrigan is an 83 y.o.  male who presented to the hospital from SNF on 10/18/24 with productive cough and SOB. He was initially hypoxic in ED but this improved to 99% on RA. CXR showed left lower lobe and left middle area infiltrates. He was made NPO awaiting SLP evaluation. and admitted with sepsis from PNA, hypotension, AKI. PMH: COPD, HIV, CVA with right upper extremity weakness, dysphagia (2023 MBS reported trace silent aspiration of nectar thick and thin liquids which was eliminated when patient taking small sips.) Clinical Impression: Clinical Impression: Keyton Bhat presents with a severe oropharyngeal dysphagia in addition to a suspected esophageal dysphagia. (DIGEST score: 3-severe) and compared to images from 2023 MBS, it does appear that his swallow function has declined. Silent aspiration (PAS 8)  occured with thin and nectar thick liquids and sensed aspiration only occured with gross aspiration of thin liquids. Laryngeal vestibule closure was incomplete and mistimed, leading to aspiration prior to and during swallow. Smaller sips as well as chin tuck posture did reduce but not eliminate the amount and frequency of aspiration; small sips of nectar thick liquids prevented aspiration and In addition, at end of study, patient indicated that he felt sick and started dry heaving. He proceeded to vomit clear-yellow liquid and barium contrast into emesis bag. The combination of his severe dysphagia, acute PNA, SNF report of overall deterioration in function all place him at a high risk for developing bacterial aspiration pneumonia. In order to maximize his airway protection, SLP recommends PO diet pudding thick liquids/puree solids. Factors that may increase risk of adverse event in presence of aspiration Noe & Lianne 2021): Factors that may increase risk of adverse event in presence of aspiration Noe & Lianne 2021): Poor general health and/or compromised immunity; Frail or deconditioned; Limited mobility; Inadequate oral hygiene; Aspiration of thick, dense, and/or acidic materials DIGEST Swallow Severity Rating*  Safety: 3  Efficiency:1  Overall Pharyngeal Swallow Severity: 3 1: mild; 2: moderate; 3: severe; 4: profound *The Dynamic Imaging Grade of Swallowing Toxicity is standardized for the head and neck cancer population, however, demonstrates promising clinical applications across populations to standardize the clinical rating of pharyngeal swallow safety and severity. Recommendations/Plan: Swallowing Evaluation Recommendations Swallowing Evaluation Recommendations Recommendations: PO diet PO Diet Recommendation: Dysphagia 1 (Pureed); Extremely thick liquids (Level 4, pudding thick) Liquid Administration via: Spoon Medication Administration: Crushed with puree Supervision: Full assist for feeding  Swallowing strategies  : Slow rate; Small bites/sips Postural changes: Position pt fully upright for meals; Stay upright 30-60 min after meals Oral care recommendations: Oral care BID (2x/day) Treatment Plan Treatment Plan Treatment recommendations: Therapy as outlined in treatment plan below Follow-up recommendations: Follow physicians's recommendations for discharge plan and follow up therapies Functional status assessment: Patient has had a recent decline in their functional status and demonstrates the ability to make significant improvements in function in a reasonable and predictable amount of time. Treatment frequency: Min 2x/week Treatment duration: 1 week Interventions: Patient/family education; Trials of upgraded texture/liquids; Diet toleration management by SLP Recommendations Recommendations for follow up therapy are one component of a multi-disciplinary discharge planning process, led by the attending physician.  Recommendations may be updated based on patient status, additional functional criteria and insurance authorization. Assessment: Orofacial Exam: Orofacial Exam Oral Cavity: Oral Hygiene: Dried secretions; Xerostomia Oral Cavity - Dentition: Poor condition Orofacial Anatomy: WFL Anatomy: No data recorded Boluses Administered: Boluses Administered Boluses Administered: Thin liquids (Level 0); Mildly thick liquids (Level 2, nectar thick); Moderately thick liquids (Level 3, honey thick); Puree; Solid  Oral Impairment Domain:  Oral Impairment Domain Lip Closure: No labial escape Tongue control during bolus hold: Posterior escape of less than half of bolus Bolus preparation/mastication: Slow prolonged chewing/mashing with complete recollection Bolus transport/lingual motion: Brisk tongue motion Oral residue: Residue collection on oral structures Location of oral residue : Tongue Initiation of pharyngeal swallow : Pyriform sinuses  Pharyngeal Impairment Domain: Pharyngeal Impairment Domain Soft palate  elevation: No bolus between soft palate (SP)/pharyngeal wall (PW) Laryngeal elevation: Complete superior movement of thyroid cartilage with complete approximation of arytenoids to epiglottic petiole Anterior hyoid excursion: Complete anterior movement Epiglottic movement: Complete inversion Laryngeal vestibule closure: Incomplete, narrow column air/contrast in laryngeal vestibule Pharyngeal stripping wave : Present - diminished Pharyngeal contraction (A/P view only): N/A Pharyngoesophageal segment opening: Complete distension and complete duration, no obstruction of flow Tongue base retraction: No contrast between tongue base and posterior pharyngeal wall (PPW) Pharyngeal residue: Collection of residue within or on pharyngeal structures Location of pharyngeal residue: Valleculae; Pyriform sinuses; Aryepiglottic folds  Esophageal Impairment Domain: Esophageal Impairment Domain Esophageal clearance upright position: Esophageal retention Pill: Pill Consistency administered: Thin liquids (Level 0) Thin liquids (Level 0): [facility] Penetration/Aspiration Scale Score: Penetration/Aspiration Scale Score 1.  Material does not enter airway: Puree; Solid; Pill 2.  Material enters airway, remains ABOVE vocal cords then ejected out: Moderately thick liquids (Level 3, honey thick) 5.  Material enters airway, CONTACTS cords and not ejected out: Thin liquids (Level 0); Mildly thick liquids (Level 2, nectar thick) 7.  Material enters airway, passes BELOW cords and not ejected out despite cough attempt by patient: Thin liquids (Level 0) 8.  Material enters airway, passes BELOW cords without attempt by patient to eject out (silent aspiration) : Thin liquids (Level 0); Mildly thick liquids (Level 2, nectar thick) Compensatory Strategies: Compensatory Strategies Compensatory strategies: Yes Chin tuck: Effective Effective Chin Tuck: Thin liquid (Level 0)   General Information: No data recorded Diet Prior to this Study: NPO   No data recorded   No data recorded  No data recorded  No data recorded Behavior/Cognition: Alert; Cooperative; Pleasant mood No data recorded No data recorded No data recorded No data recorded Exam Limitations: No limitations Goal Planning: Prognosis for improved oropharyngeal function: Guarded Barriers to Reach Goals: Time post onset No data recorded Patient/Family Stated Goal: no family present, no stated goals per patient Consulted and agree with results and recommendations: Pt unable/family or caregiver not available Pain: Pain Assessment Pain Assessment: Faces Faces Pain Scale: 4 Breathing: 0 Negative Vocalization: 0 Facial Expression: 0 Body Language: 0 Consolability: 0 PAINAD Score: 0 Facial Expression: 0 Body Movements: 0 Muscle Tension: 0 Compliance with ventilator (intubated pts.): N/A Vocalization (extubated pts.): 0 CPOT Total: 0 Pain Location: throat after a few swallows of barium Pain Descriptors / Indicators: Discomfort; Grimacing; Sore Pain Intervention(s): Monitored during session End of Session: Start Time:SLP Start Time (ACUTE ONLY): 1125 Stop Time: SLP Stop Time (ACUTE ONLY): 1148 Time Calculation:SLP Time Calculation (min) (ACUTE ONLY): 23 min Charges: SLP Evaluations $ SLP Speech Visit: 1 Visit SLP Evaluations $BSS Swallow: 1 Procedure $MBS Swallow: 1 Procedure SLP visit diagnosis: SLP Visit Diagnosis: Dysphagia, oropharyngeal phase (R13.12) Past Medical History: Past Medical History: Diagnosis Date  Arthritis   Asthma   Bradycardia   Depression   Enlarged prostate   HIV (human immunodeficiency virus infection) (HCC)   Hyperlipidemia   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  Stroke [facility])  Past Surgical History: Past Surgical History: Procedure Laterality Date  cataracts    EYE SURGERY    PACEMAKER INSERTION  N/A 08/02/2015  Procedure: INSERTION PACEMAKER;  Surgeon: Sheralyn Flock, MD;  Location: [name];  Service: Cardiovascular;  Laterality: N/A; Norleen IVAR Blase, MA, CCC-SLP Speech Therapy 10/19/2024, 3:42  PM  ECHOCARDIOGRAM COMPLETE Result Date: 10/19/2024    ECHOCARDIOGRAM REPORT   Patient Name:   PARMINDER CUPPLES Date of Exam: 10/19/2024 Medical Rec #:  [id number]    Height:       67.0 in Accession #:    [id number]   Weight:       111.8 lb Date of Birth:  Jun 26, 1941     BSA:          1.579 m Patient Age:    83 years     BP:           145/73 mmHg Patient Gender: M            HR:           57 bpm. Exam Location:  Inpatient Procedure: 2D Echo, Cardiac Doppler and Color Doppler (Both Spectral and Color            Flow Doppler were utilized during procedure). Indications:    Bacteremia R78.81  History:        Patient has prior history of Echocardiogram examinations, most                 recent 04/25/2022. Previous Myocardial Infarction, Stroke; Risk                 Factors:Dyslipidemia.  Sonographer:    Tinnie Gosling RDCS Referring Phys: [id number] BURGESS BROCKS AMIN IMPRESSIONS  1. Left ventricular ejection fraction, by estimation, is 50 to 55%. The left ventricle has low normal function. The left ventricle has no regional wall motion abnormalities. There is mild concentric left ventricular hypertrophy. Left ventricular diastolic parameters are consistent with Grade II diastolic dysfunction (pseudonormalization).  2. Right ventricular systolic function is normal. The right ventricular size is normal.  3. There is no evidence of cardiac tamponade.  4. The mitral valve is normal in structure. Trivial mitral valve regurgitation. No evidence of mitral stenosis.  5. The aortic valve is normal in structure. There is mild calcification of the aortic valve. Aortic valve regurgitation is mild. No aortic stenosis is present.  6. Aortic dilatation noted. 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  7. The inferior vena cava is normal in size with greater than 50% respiratory variability, suggesting right atrial pressure of 3 mmHg.  Conclusion(s)/Recommendation(s): No obvious vegetations seen on chest wall echo. The aortic valve is mildly calcified but I do not see a frank vegetation. If clinical suspicion for endocarditis is high, would recommned TEE. FINDINGS  Left Ventricle: Left ventricular ejection fraction, by estimation, is 50 to 55%. The left ventricle has low normal function. The left ventricle has no regional wall motion abnormalities. The left ventricular internal cavity size was normal in size. There is mild concentric left ventricular hypertrophy. Left ventricular diastolic parameters are consistent with Grade II diastolic dysfunction (pseudonormalization). Right Ventricle: The right ventricular size is normal. No increase in right ventricular wall thickness. Right ventricular systolic function is normal. Left Atrium: Left atrial size was normal in size. Right Atrium: Right atrial size was normal in size. Pericardium: Trivial pericardial effusion is present. The pericardial effusion is anterior to the right ventricle. There is no evidence of cardiac tamponade. Mitral Valve: The mitral valve is normal in structure.  Trivial mitral valve regurgitation. No evidence of mitral valve stenosis. Tricuspid Valve: The tricuspid valve is normal in structure. Tricuspid valve regurgitation is mild . No evidence of tricuspid stenosis. Aortic Valve: The aortic valve is normal in structure. There is mild calcification of the aortic valve. Aortic valve regurgitation is mild. No aortic stenosis is present. Pulmonic Valve: The pulmonic valve was normal in structure. Pulmonic valve regurgitation is trivial. No evidence of pulmonic stenosis. Aorta: Aortic dilatation noted. 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 Venous: The inferior vena cava is normal in size with greater than 50% respiratory variability, suggesting right atrial pressure of 3 mmHg. IAS/Shunts: No  atrial level shunt detected by color flow Doppler.  LEFT VENTRICLE PLAX 2D LVIDd:         5.00 cm     Diastology LVIDs:         3.50 cm     LV e' medial:    4.03 cm/s LV PW:         1.20 cm     LV E/e' medial:  16.5 LV IVS:        1.20 cm     LV e' lateral:   3.81 cm/s LVOT diam:     2.00 cm     LV E/e' lateral: 17.4 LV SV:         53 LV SV Index:   34 LVOT Area:     3.14 cm  LV Volumes (MOD) LV vol d, MOD A2C: 60.9 ml LV vol d, MOD A4C: 77.7 ml LV vol s, MOD A2C: 29.6 ml LV vol s, MOD A4C: 33.5 ml LV SV MOD A2C:     31.3 ml LV SV MOD A4C:     77.7 ml LV SV MOD BP:      41.8 ml RIGHT VENTRICLE             IVC RV S prime:     10.90 cm/s  IVC diam: 1.90 cm TAPSE (M-mode): 2.0 cm LEFT ATRIUM           Index LA diam:      3.70 cm 2.34 cm/m LA Vol (A4C): 41.5 ml 26.28 ml/m  AORTIC VALVE LVOT Vmax:   67.90 cm/s LVOT Vmean:  47.800 cm/s LVOT VTI:    0.169 m  AORTA Ao Root diam: 4.00 cm Ao Asc diam:  4.00 cm MITRAL VALVE               TRICUSPID VALVE MV Area (PHT): 4.15 cm    TR Peak grad:   23.2 mmHg MV E velocity: 66.40 cm/s  TR Vmax:        241.00 cm/s MV A velocity: 69.80 cm/s MV E/A ratio:  0.95        SHUNTS                            Systemic VTI:  0.17 m                            Systemic Diam: 2.00 cm Toribio Fuel MD Electronically signed by Toribio Fuel MD Signature Date/Time: 10/19/2024/11:06:07 AM    Final    DG Chest Port 1 View if patient is in a treatment room. Result Date: 10/17/2024 EXAM: 1 VIEW(S) XRAY OF THE CHEST 10/17/2024 08:24:00 PM  COMPARISON: 05/06/24. CLINICAL HISTORY: Suspected Sepsis Suspected Sepsis FINDINGS: LUNGS AND PLEURA: Retrocardiac airspace opacities. Infiltrates in the left midlung. No pleural effusion. No pneumothorax. HEART AND MEDIASTINUM: No acute abnormality of the cardiac and mediastinal silhouettes. BONES AND SOFT TISSUES: No acute osseous abnormality. Left chest wall pacemaker. IMPRESSION: 1. Left mid and lower lung pneumonia. Electronically signed by: Norman Gatlin MD 10/17/2024 08:31 PM EST RP Workstation: [id number]    Microbiology: Results for orders placed or performed during the hospital encounter of 10/17/24  Resp panel by RT-PCR (RSV, Flu A&B, Covid) Anterior Nasal Swab     Status: None   Collection Time: 10/17/24  8:16 PM   Specimen: Anterior Nasal Swab  Result Value Ref Range Status   SARS Coronavirus 2 by RT PCR NEGATIVE NEGATIVE Final   Influenza A by PCR NEGATIVE NEGATIVE Final   Influenza B by PCR NEGATIVE NEGATIVE Final    Comment: (NOTE) The Xpert Xpress SARS-CoV-2/FLU/RSV plus assay is intended as an aid in the diagnosis of influenza from Nasopharyngeal swab specimens and should not be used as a sole basis for treatment. Nasal washings and aspirates are unacceptable for Xpert Xpress SARS-CoV-2/FLU/RSV testing.  Fact Sheet for Patients: bloggercourse.com  Fact Sheet for Healthcare Providers: seriousbroker.it  This test is not yet approved or cleared by the [place]  FDA and has been authorized for detection and/or diagnosis of SARS-CoV-2 by FDA under an Emergency Use Authorization (EUA). This EUA will remain in effect (meaning this test can be used) for the duration of the COVID-19 declaration under Section 564(b)(1) of the Act, 21 U.S.C. section 360bbb-3(b)(1), unless the authorization is terminated or revoked.     Resp Syncytial Virus by PCR NEGATIVE NEGATIVE Final    Comment: (NOTE) Fact Sheet for Patients: bloggercourse.com  Fact Sheet for Healthcare Providers: seriousbroker.it  This test is not yet approved or cleared by the [place]  FDA and has been authorized for detection and/or diagnosis of SARS-CoV-2 by FDA under an Emergency Use Authorization (EUA). This EUA will remain in effect (meaning this test can be used) for the duration of the COVID-19 declaration under Section 564(b)(1) of the Act,  21 U.S.C. section 360bbb-3(b)(1), unless the authorization is terminated or revoked.  Performed at [facility], [address] [place], [place] [address]   Blood Culture (routine x 2)     Status: Abnormal   Collection Time: 10/17/24  8:16 PM   Specimen: BLOOD RIGHT FOREARM  Result Value Ref Range Status   Specimen Description BLOOD RIGHT FOREARM  Final   Special Requests   Final    BOTTLES DRAWN AEROBIC ONLY Blood Culture results may not be optimal due to an inadequate volume of blood received in culture bottles   Culture  Setup Time   Final    GRAM POSITIVE COCCI AEROBIC BOTTLE ONLY CRITICAL VALUE NOTED.  VALUE IS CONSISTENT WITH PREVIOUSLY REPORTED AND CALLED VALUE.    Culture (A)  Final    AEROCOCCUS URINAE Standardized susceptibility testing for this organism is not available. Performed at [facility], [address], [place], [place] [address]    Report Status 10/21/2024 FINAL  Final  Blood Culture (routine x 2)     Status: Abnormal   Collection Time: 10/17/24  8:21 PM   Specimen: BLOOD  Result Value Ref Range Status   Specimen Description BLOOD RIGHT ANTECUBITAL  Final   Special Requests   Final    BOTTLES DRAWN AEROBIC AND ANAEROBIC Blood Culture adequate volume  Culture  Setup Time   Final    GRAM POSITIVE COCCI IN BOTH AEROBIC AND ANAEROBIC BOTTLES CRITICAL RESULT CALLED TO, READ BACK BY AND VERIFIED WITH: [facility]  PHARMD 10/19/2024 @ 0335 BY DD    Culture (A)  Final    AEROCOCCUS URINAE ENTEROCOCCUS FAECALIS VANCOMYCIN  RESISTANT ENTEROCOCCUS ISOLATED CRITICAL RESULT CALLED TO, READ BACK BY AND VERIFIED WITH: MAYA Karleen MATSU [phone] fcp Performed at [facility], [address], [place], [place] [address]    Report Status 10/24/2024 FINAL  Final   Organism ID, Bacteria ENTEROCOCCUS FAECALIS  Final      Susceptibility   Enterococcus faecalis - MIC*    AMPICILLIN  <=2 SENSITIVE Sensitive     VANCOMYCIN  >=32 RESISTANT Resistant      GENTAMICIN  SYNERGY SENSITIVE Sensitive     * ENTEROCOCCUS FAECALIS  Blood Culture ID Panel (Reflexed)     Status: None   Collection Time: 10/17/24  8:21 PM  Result Value Ref Range Status   Enterococcus faecalis NOT DETECTED NOT DETECTED Final   Enterococcus Faecium NOT DETECTED NOT DETECTED Final   Listeria monocytogenes NOT DETECTED NOT DETECTED Final   Staphylococcus species NOT DETECTED NOT DETECTED Final   Staphylococcus aureus (BCID) NOT DETECTED NOT DETECTED Final   Staphylococcus epidermidis NOT DETECTED NOT DETECTED Final   Staphylococcus lugdunensis NOT DETECTED NOT DETECTED Final   Streptococcus species NOT DETECTED NOT DETECTED Final   Streptococcus agalactiae NOT DETECTED NOT DETECTED Final   Streptococcus pneumoniae NOT DETECTED NOT DETECTED Final   Streptococcus pyogenes NOT DETECTED NOT DETECTED Final   A.calcoaceticus-baumannii NOT DETECTED NOT DETECTED Final   Bacteroides fragilis NOT DETECTED NOT DETECTED Final   Enterobacterales NOT DETECTED NOT DETECTED Final   Enterobacter cloacae complex NOT DETECTED NOT DETECTED Final   Escherichia coli NOT DETECTED NOT DETECTED Final   Klebsiella aerogenes NOT DETECTED NOT DETECTED Final   Klebsiella oxytoca NOT DETECTED NOT DETECTED Final   Klebsiella pneumoniae NOT DETECTED NOT DETECTED Final   Proteus species NOT DETECTED NOT DETECTED Final   Salmonella species NOT DETECTED NOT DETECTED Final   Serratia marcescens NOT DETECTED NOT DETECTED Final   Haemophilus influenzae NOT DETECTED NOT DETECTED Final   Neisseria meningitidis NOT DETECTED NOT DETECTED Final   Pseudomonas aeruginosa NOT DETECTED NOT DETECTED Final   Stenotrophomonas maltophilia NOT DETECTED NOT DETECTED Final   Candida albicans NOT DETECTED NOT DETECTED Final   Candida auris NOT DETECTED NOT DETECTED Final   Candida glabrata NOT DETECTED NOT DETECTED Final   Candida krusei NOT DETECTED NOT DETECTED Final   Candida parapsilosis NOT DETECTED NOT DETECTED  Final   Candida tropicalis NOT DETECTED NOT DETECTED Final   Cryptococcus neoformans/gattii NOT DETECTED NOT DETECTED Final    Comment: Performed at [facility], [address], [place], [place] [address]  MRSA Next Gen by PCR, Nasal     Status: None   Collection Time: 10/18/24 12:44 AM   Specimen: Nasal Mucosa; Nasal Swab  Result Value Ref Range Status   MRSA by PCR Next Gen NOT DETECTED NOT DETECTED Final    Comment: (NOTE) The GeneXpert MRSA Assay (FDA approved for NASAL specimens only), is one component of a comprehensive MRSA colonization surveillance program. It is not intended to diagnose MRSA infection nor to guide or monitor treatment for MRSA infections. Test performance is not FDA approved in patients less than 58 years old. Performed at [facility], [address], [place], [place] [address]  Urine Culture     Status: Abnormal   Collection Time: 10/18/24 12:55 AM   Specimen: Urine, Random  Result Value Ref Range Status   Specimen Description URINE, RANDOM  Final   Special Requests   Final    NONE Reflexed from [phone] Performed at [facility], [address], [place], [place] [address]    Culture MULTIPLE SPECIES PRESENT, SUGGEST RECOLLECTION (A)  Final   Report Status 10/19/2024 FINAL  Final  Respiratory (~20 pathogens) panel by PCR     Status: None   Collection Time: 10/18/24  9:59 AM   Specimen: Nasopharyngeal Swab; Respiratory  Result Value Ref Range Status   Adenovirus NOT DETECTED NOT DETECTED Final   Coronavirus 229E NOT DETECTED NOT DETECTED Final    Comment: (NOTE) The Coronavirus on the Respiratory Panel, DOES NOT test for the novel  Coronavirus (2019 nCoV)    Coronavirus HKU1 NOT DETECTED NOT DETECTED Final   Coronavirus NL63 NOT DETECTED NOT DETECTED Final   Coronavirus OC43 NOT DETECTED NOT DETECTED Final   Metapneumovirus NOT DETECTED NOT DETECTED Final   Rhinovirus / Enterovirus NOT DETECTED NOT DETECTED Final   Influenza A  NOT DETECTED NOT DETECTED Final   Influenza B NOT DETECTED NOT DETECTED Final   Parainfluenza Virus 1 NOT DETECTED NOT DETECTED Final   Parainfluenza Virus 2 NOT DETECTED NOT DETECTED Final   Parainfluenza Virus 3 NOT DETECTED NOT DETECTED Final   Parainfluenza Virus 4 NOT DETECTED NOT DETECTED Final   Respiratory Syncytial Virus NOT DETECTED NOT DETECTED Final   Bordetella pertussis NOT DETECTED NOT DETECTED Final   Bordetella Parapertussis NOT DETECTED NOT DETECTED Final   Chlamydophila pneumoniae NOT DETECTED NOT DETECTED Final   Mycoplasma pneumoniae NOT DETECTED NOT DETECTED Final    Comment: Performed at [facility], [address] [place], [place] [address]  Culture, blood (Routine X 2) w Reflex to ID Panel     Status: None   Collection Time: 10/19/24  8:44 AM   Specimen: BLOOD LEFT HAND  Result Value Ref Range Status   Specimen Description   Final    BLOOD LEFT HAND Performed at [facility], [address] [place], [place] [address]    Special Requests   Final    BOTTLES DRAWN AEROBIC AND ANAEROBIC Blood Culture results may not be optimal due to an inadequate volume of blood received in culture bottles Performed at [facility], [address], [place], [place] [address]    Culture   Final    NO GROWTH 5 DAYS Performed at [facility], [address], [place], [place] [address]    Report Status 10/24/2024 FINAL  Final  Culture, blood (Routine X 2) w Reflex to ID Panel     Status: None   Collection Time: 10/19/24  8:46 AM   Specimen: BLOOD LEFT HAND  Result Value Ref Range Status   Specimen Description   Final    BLOOD LEFT HAND Performed at [facility], [address] [place], [place] [address]    Special Requests   Final    BOTTLES DRAWN AEROBIC AND ANAEROBIC Blood Culture results may not be optimal due to an inadequate volume of blood received in culture bottles Performed at [facility], [address] [place], [place] [address]    Culture   Final    NO GROWTH 5 DAYS Performed at [facility], [address], [place], [place] [address]    Report Status 10/24/2024 FINAL  Final    Labs: CBC: Recent Labs  Lab 10/21/24 0524  WBC 7.4  HGB 12.6*  HCT 35.4*  MCV 91.9  PLT 101*   Basic Metabolic Panel: Recent Labs  Lab 10/20/24 0258 10/24/24 0550 10/25/24 0512  NA 139  --  142  K 3.6  --  3.1*  CL 108  --  105  CO2 23  --  25  GLUCOSE 87  --  84  BUN 12  --  8  CREATININE 0.76 0.70 0.69  CALCIUM  7.7*  --  8.5*  MG 2.3  --   --   PHOS  --   --  3.2   Liver Function Tests: No results for input(s): AST, ALT, ALKPHOS, BILITOT, PROT, ALBUMIN in the last 168 hours. CBG: Recent Labs  Lab 10/25/24 1615 10/25/24 2007 10/25/24 2355 10/26/24 0356 10/26/24 0739  GLUCAP 110* 93 110* 88 105*    Discharge time spent: greater than 30 minutes.  Signed: Owen DELENA Lore, MD [facility] 10/26/2024 "

## 2024-10-26 NOTE — TOC Transition Note (Addendum)
 Transition of Care [facility]) - Discharge Note   Patient Details  Name: Brian [name] MRN: [id number] Date of Birth: 1941/08/30  [facility] CM/SW Contact:  Bascom Service, RN Phone Number: 10/26/2024, 8:54 AM   Clinical Narrative:Received plan shara #[id number].[facility] rep Darrian accepted/has bed. Await updated d/c summary,rm,report# prior PTAR.   -10:45a-going to [facility] rep Darrian accepted;rm#[id number],tel# report [phone]. PTAR when ready.  -10:54a PTAR called. No further CM needs. -2:58p-PTAR called post being on hold. No further CM needs.    Final next level of care: Skilled Nursing Facility Barriers to Discharge: No Barriers Identified   Patient Goals and CMS Choice Patient states their goals for this hospitalization and ongoing recovery are:: Rehab CMS Medicare.gov Compare Post Acute Care list provided to:: Patient Represenative (must comment) Choice offered to / list presented to : Adult Children [place] ownership interest in [facility].provided to:: Adult Children    Discharge Placement              Patient chooses bed at: [facility] Patient to be transferred to facility by: PTAR Name of family member notified: Mercedes(son)/Jaydra(dtr n law) Patient and family notified of of transfer: 10/26/24  Discharge Plan and Services Additional resources added to the After Visit Summary for   In-house Referral: NA Discharge Planning Services: CM Consult Post Acute Care Choice: Skilled Nursing Facility          DME Arranged: N/A DME Agency: NA       HH Arranged: NA HH Agency: NA        Social Drivers of Health (SDOH) Interventions SDOH Screenings   Food Insecurity: Patient Unable To Answer (11/29/2023)  Housing: Patient Unable To Answer (11/29/2023)  Transportation Needs: Patient Unable To Answer (10/18/2024)  Utilities: Patient Unable To Answer (11/29/2023)  Depression (PHQ2-9): Low Risk (06/25/2022)  Social Connections: Patient Unable To  Answer (11/29/2023)  Tobacco Use: Medium Risk (10/17/2024)     Readmission Risk Interventions    10/19/2024   10:47 AM  Readmission Risk Prevention Plan  Transportation Screening Complete  PCP or Specialist Appt within 5-7 Days Complete  Home Care Screening Complete  Medication Review (RN CM) Complete

## 2024-10-27 ENCOUNTER — Other Ambulatory Visit (HOSPITAL_COMMUNITY): Payer: Self-pay

## 2024-10-29 ENCOUNTER — Other Ambulatory Visit: Payer: Self-pay | Admitting: Pharmacy Technician

## 2024-10-29 ENCOUNTER — Other Ambulatory Visit: Payer: Self-pay

## 2024-10-29 NOTE — Progress Notes (Signed)
 Specialty Pharmacy Refill Coordination Note  Brian [name] is a 84 y.o. male contacted today regarding refills of specialty medication(s) Abacavir -Dolutegravir -Lamivud (Triumeq )   Patient requested Delivery   Delivery date: 11/02/24   Verified address: [address]   [place] [id number]   Medication will be filled on: 11/01/24   Spoke with daughter in law -Willmar Stockinger

## 2024-11-02 ENCOUNTER — Other Ambulatory Visit: Payer: Self-pay

## 2024-11-26 ENCOUNTER — Other Ambulatory Visit: Payer: Self-pay

## 2024-11-29 ENCOUNTER — Other Ambulatory Visit: Payer: Self-pay

## 2024-12-01 ENCOUNTER — Other Ambulatory Visit (HOSPITAL_COMMUNITY): Payer: Self-pay

## 2024-12-01 NOTE — Progress Notes (Signed)
 Specialty Pharmacy Refill Coordination Note  Brian [name] is a 83 y.o. male contacted today regarding refills of specialty medication(s) Abacavir -Dolutegravir -Lamivud (Triumeq )   Patient requested Delivery   Delivery date: 12/07/24   Verified address: [address]   [place] [id number]   Medication will be filled on: 12/06/24
